# Patient Record
Sex: Male | Born: 1940 | Race: White | Hispanic: No | Marital: Married | Smoking: Former smoker
Health system: Southern US, Community
[De-identification: ages and names within clinical notes are randomized; demographics above are authoritative.]

## PROBLEM LIST (undated history)

## (undated) DIAGNOSIS — Z8719 Personal history of other diseases of the digestive system: Secondary | ICD-10-CM

## (undated) DIAGNOSIS — E1122 Type 2 diabetes mellitus with diabetic chronic kidney disease: Secondary | ICD-10-CM

## (undated) DIAGNOSIS — E039 Hypothyroidism, unspecified: Secondary | ICD-10-CM

## (undated) DIAGNOSIS — R112 Nausea with vomiting, unspecified: Secondary | ICD-10-CM

## (undated) DIAGNOSIS — I10 Essential (primary) hypertension: Secondary | ICD-10-CM

## (undated) DIAGNOSIS — Z87891 Personal history of nicotine dependence: Secondary | ICD-10-CM

## (undated) DIAGNOSIS — E785 Hyperlipidemia, unspecified: Secondary | ICD-10-CM

## (undated) DIAGNOSIS — N183 Chronic kidney disease, stage 3 unspecified: Secondary | ICD-10-CM

## (undated) DIAGNOSIS — M8580 Other specified disorders of bone density and structure, unspecified site: Secondary | ICD-10-CM

## (undated) DIAGNOSIS — I2510 Atherosclerotic heart disease of native coronary artery without angina pectoris: Secondary | ICD-10-CM

## (undated) DIAGNOSIS — K219 Gastro-esophageal reflux disease without esophagitis: Secondary | ICD-10-CM

## (undated) DIAGNOSIS — I219 Acute myocardial infarction, unspecified: Secondary | ICD-10-CM

## (undated) DIAGNOSIS — Z9289 Personal history of other medical treatment: Secondary | ICD-10-CM

## (undated) DIAGNOSIS — I739 Peripheral vascular disease, unspecified: Secondary | ICD-10-CM

## (undated) DIAGNOSIS — Z9889 Other specified postprocedural states: Secondary | ICD-10-CM

## (undated) DIAGNOSIS — I442 Atrioventricular block, complete: Secondary | ICD-10-CM

## (undated) DIAGNOSIS — J9819 Other pulmonary collapse: Secondary | ICD-10-CM

## (undated) DIAGNOSIS — I679 Cerebrovascular disease, unspecified: Secondary | ICD-10-CM

## (undated) DIAGNOSIS — Z8711 Personal history of peptic ulcer disease: Secondary | ICD-10-CM

## (undated) DIAGNOSIS — M1990 Unspecified osteoarthritis, unspecified site: Secondary | ICD-10-CM

## (undated) DIAGNOSIS — Z950 Presence of cardiac pacemaker: Secondary | ICD-10-CM

## (undated) DIAGNOSIS — E119 Type 2 diabetes mellitus without complications: Secondary | ICD-10-CM

## (undated) HISTORY — PX: LEG AMPUTATION BELOW KNEE: SHX694

## (undated) HISTORY — DX: Other specified disorders of bone density and structure, unspecified site: M85.80

## (undated) HISTORY — PX: ARTERIAL BYPASS SURGRY: SHX557

## (undated) HISTORY — PX: THORACOTOMY: SUR1349

## (undated) HISTORY — DX: Personal history of nicotine dependence: Z87.891

## (undated) HISTORY — DX: Atherosclerotic heart disease of native coronary artery without angina pectoris: I25.10

## (undated) HISTORY — DX: Cerebrovascular disease, unspecified: I67.9

## (undated) HISTORY — DX: Other pulmonary collapse: J98.19

## (undated) HISTORY — PX: CARPAL TUNNEL RELEASE: SHX101

## (undated) HISTORY — PX: INSERT / REPLACE / REMOVE PACEMAKER: SUR710

## (undated) HISTORY — DX: Essential (primary) hypertension: I10

## (undated) HISTORY — DX: Hypothyroidism, unspecified: E03.9

## (undated) HISTORY — PX: CATARACT EXTRACTION W/ INTRAOCULAR LENS  IMPLANT, BILATERAL: SHX1307

## (undated) HISTORY — PX: CARDIAC CATHETERIZATION: SHX172

## (undated) HISTORY — DX: Hyperlipidemia, unspecified: E78.5

---

## 1998-02-08 ENCOUNTER — Encounter: Admission: RE | Admit: 1998-02-08 | Discharge: 1998-05-09 | Payer: Self-pay | Admitting: Family Medicine

## 1998-09-06 ENCOUNTER — Ambulatory Visit (HOSPITAL_BASED_OUTPATIENT_CLINIC_OR_DEPARTMENT_OTHER): Admission: RE | Admit: 1998-09-06 | Discharge: 1998-09-06 | Payer: Self-pay | Admitting: Orthopedic Surgery

## 1999-01-25 ENCOUNTER — Inpatient Hospital Stay (HOSPITAL_COMMUNITY): Admission: EM | Admit: 1999-01-25 | Discharge: 1999-02-06 | Payer: Self-pay | Admitting: Emergency Medicine

## 1999-01-25 ENCOUNTER — Encounter: Payer: Self-pay | Admitting: Family Medicine

## 1999-01-29 ENCOUNTER — Encounter: Payer: Self-pay | Admitting: Family Medicine

## 1999-01-31 ENCOUNTER — Encounter: Payer: Self-pay | Admitting: Family Medicine

## 1999-02-19 ENCOUNTER — Encounter: Admission: RE | Admit: 1999-02-19 | Discharge: 1999-02-19 | Payer: Self-pay | Admitting: Family Medicine

## 1999-02-20 ENCOUNTER — Observation Stay (HOSPITAL_COMMUNITY): Admission: RE | Admit: 1999-02-20 | Discharge: 1999-02-21 | Payer: Self-pay | Admitting: Orthopedic Surgery

## 1999-02-20 ENCOUNTER — Encounter (INDEPENDENT_AMBULATORY_CARE_PROVIDER_SITE_OTHER): Payer: Self-pay | Admitting: Specialist

## 1999-03-06 ENCOUNTER — Encounter (INDEPENDENT_AMBULATORY_CARE_PROVIDER_SITE_OTHER): Payer: Self-pay | Admitting: Specialist

## 1999-03-06 ENCOUNTER — Inpatient Hospital Stay (HOSPITAL_COMMUNITY): Admission: EM | Admit: 1999-03-06 | Discharge: 1999-03-08 | Payer: Self-pay | Admitting: Orthopedic Surgery

## 1999-03-13 ENCOUNTER — Encounter: Admission: RE | Admit: 1999-03-13 | Discharge: 1999-06-11 | Payer: Self-pay | Admitting: Orthopedic Surgery

## 1999-03-26 ENCOUNTER — Inpatient Hospital Stay (HOSPITAL_COMMUNITY): Admission: EM | Admit: 1999-03-26 | Discharge: 1999-03-31 | Payer: Self-pay | Admitting: Orthopedic Surgery

## 1999-03-26 ENCOUNTER — Encounter (INDEPENDENT_AMBULATORY_CARE_PROVIDER_SITE_OTHER): Payer: Self-pay | Admitting: Specialist

## 1999-04-02 ENCOUNTER — Encounter: Admission: RE | Admit: 1999-04-02 | Discharge: 1999-04-20 | Payer: Self-pay | Admitting: Orthopedic Surgery

## 1999-06-26 ENCOUNTER — Encounter: Admission: RE | Admit: 1999-06-26 | Discharge: 1999-08-22 | Payer: Self-pay | Admitting: Orthopedic Surgery

## 2000-05-06 DIAGNOSIS — I219 Acute myocardial infarction, unspecified: Secondary | ICD-10-CM

## 2000-05-06 DIAGNOSIS — Z9289 Personal history of other medical treatment: Secondary | ICD-10-CM

## 2000-05-06 HISTORY — PX: CORONARY ARTERY BYPASS GRAFT: SHX141

## 2000-05-06 HISTORY — DX: Personal history of other medical treatment: Z92.89

## 2000-05-06 HISTORY — DX: Acute myocardial infarction, unspecified: I21.9

## 2000-06-23 ENCOUNTER — Ambulatory Visit (HOSPITAL_COMMUNITY): Admission: RE | Admit: 2000-06-23 | Discharge: 2000-06-24 | Payer: Self-pay | Admitting: Cardiology

## 2000-06-23 ENCOUNTER — Encounter: Payer: Self-pay | Admitting: Cardiology

## 2000-06-30 ENCOUNTER — Inpatient Hospital Stay (HOSPITAL_COMMUNITY): Admission: RE | Admit: 2000-06-30 | Discharge: 2000-07-12 | Payer: Self-pay | Admitting: Cardiothoracic Surgery

## 2000-06-30 ENCOUNTER — Encounter (INDEPENDENT_AMBULATORY_CARE_PROVIDER_SITE_OTHER): Payer: Self-pay | Admitting: *Deleted

## 2000-06-30 ENCOUNTER — Encounter: Payer: Self-pay | Admitting: Cardiothoracic Surgery

## 2000-07-01 ENCOUNTER — Encounter: Payer: Self-pay | Admitting: Cardiothoracic Surgery

## 2000-07-02 ENCOUNTER — Encounter: Payer: Self-pay | Admitting: Cardiothoracic Surgery

## 2000-07-03 ENCOUNTER — Encounter: Payer: Self-pay | Admitting: Cardiothoracic Surgery

## 2000-07-04 ENCOUNTER — Encounter: Payer: Self-pay | Admitting: Cardiothoracic Surgery

## 2000-07-05 ENCOUNTER — Encounter: Payer: Self-pay | Admitting: Cardiothoracic Surgery

## 2000-07-09 ENCOUNTER — Encounter: Payer: Self-pay | Admitting: Cardiothoracic Surgery

## 2000-07-11 ENCOUNTER — Encounter: Payer: Self-pay | Admitting: Cardiothoracic Surgery

## 2000-07-25 ENCOUNTER — Encounter: Payer: Self-pay | Admitting: Cardiothoracic Surgery

## 2000-07-25 ENCOUNTER — Encounter: Admission: RE | Admit: 2000-07-25 | Discharge: 2000-07-25 | Payer: Self-pay | Admitting: Cardiothoracic Surgery

## 2002-09-09 ENCOUNTER — Encounter: Payer: Self-pay | Admitting: Vascular Surgery

## 2002-09-09 ENCOUNTER — Inpatient Hospital Stay (HOSPITAL_COMMUNITY): Admission: RE | Admit: 2002-09-09 | Discharge: 2002-09-17 | Payer: Self-pay | Admitting: Vascular Surgery

## 2002-09-10 ENCOUNTER — Encounter: Payer: Self-pay | Admitting: Vascular Surgery

## 2002-10-12 ENCOUNTER — Inpatient Hospital Stay (HOSPITAL_COMMUNITY): Admission: AD | Admit: 2002-10-12 | Discharge: 2002-10-23 | Payer: Self-pay | Admitting: Vascular Surgery

## 2002-10-12 ENCOUNTER — Encounter (INDEPENDENT_AMBULATORY_CARE_PROVIDER_SITE_OTHER): Payer: Self-pay | Admitting: *Deleted

## 2002-10-13 ENCOUNTER — Encounter: Payer: Self-pay | Admitting: Vascular Surgery

## 2002-10-15 ENCOUNTER — Encounter (INDEPENDENT_AMBULATORY_CARE_PROVIDER_SITE_OTHER): Payer: Self-pay | Admitting: *Deleted

## 2003-01-25 ENCOUNTER — Inpatient Hospital Stay (HOSPITAL_COMMUNITY): Admission: RE | Admit: 2003-01-25 | Discharge: 2003-01-31 | Payer: Self-pay | Admitting: Orthopedic Surgery

## 2003-01-25 ENCOUNTER — Encounter (INDEPENDENT_AMBULATORY_CARE_PROVIDER_SITE_OTHER): Payer: Self-pay | Admitting: Specialist

## 2003-03-15 ENCOUNTER — Encounter: Admission: RE | Admit: 2003-03-15 | Discharge: 2003-06-13 | Payer: Self-pay | Admitting: Orthopedic Surgery

## 2003-06-14 ENCOUNTER — Encounter: Admission: RE | Admit: 2003-06-14 | Discharge: 2003-07-19 | Payer: Self-pay | Admitting: Orthopedic Surgery

## 2003-08-29 ENCOUNTER — Inpatient Hospital Stay (HOSPITAL_COMMUNITY): Admission: EM | Admit: 2003-08-29 | Discharge: 2003-09-02 | Payer: Self-pay | Admitting: Emergency Medicine

## 2003-08-31 ENCOUNTER — Encounter: Payer: Self-pay | Admitting: Cardiovascular Disease

## 2003-09-01 ENCOUNTER — Encounter (INDEPENDENT_AMBULATORY_CARE_PROVIDER_SITE_OTHER): Payer: Self-pay | Admitting: Specialist

## 2003-09-06 ENCOUNTER — Encounter (INDEPENDENT_AMBULATORY_CARE_PROVIDER_SITE_OTHER): Payer: Self-pay | Admitting: Specialist

## 2003-09-06 ENCOUNTER — Inpatient Hospital Stay (HOSPITAL_COMMUNITY): Admission: RE | Admit: 2003-09-06 | Discharge: 2003-09-14 | Payer: Self-pay | Admitting: Thoracic Surgery

## 2003-09-22 ENCOUNTER — Encounter: Admission: RE | Admit: 2003-09-22 | Discharge: 2003-09-22 | Payer: Self-pay | Admitting: Thoracic Surgery

## 2003-10-14 ENCOUNTER — Ambulatory Visit (HOSPITAL_COMMUNITY): Admission: RE | Admit: 2003-10-14 | Discharge: 2003-10-14 | Payer: Self-pay | Admitting: *Deleted

## 2003-11-24 ENCOUNTER — Encounter: Admission: RE | Admit: 2003-11-24 | Discharge: 2003-11-24 | Payer: Self-pay | Admitting: Thoracic Surgery

## 2004-02-15 ENCOUNTER — Encounter: Admission: RE | Admit: 2004-02-15 | Discharge: 2004-02-15 | Payer: Self-pay | Admitting: Thoracic Surgery

## 2005-05-13 ENCOUNTER — Ambulatory Visit (HOSPITAL_COMMUNITY): Admission: RE | Admit: 2005-05-13 | Discharge: 2005-05-13 | Payer: Self-pay | Admitting: Vascular Surgery

## 2005-06-07 ENCOUNTER — Inpatient Hospital Stay (HOSPITAL_COMMUNITY): Admission: RE | Admit: 2005-06-07 | Discharge: 2005-06-10 | Payer: Self-pay | Admitting: Vascular Surgery

## 2005-06-07 ENCOUNTER — Encounter (INDEPENDENT_AMBULATORY_CARE_PROVIDER_SITE_OTHER): Payer: Self-pay | Admitting: Specialist

## 2005-06-21 ENCOUNTER — Inpatient Hospital Stay (HOSPITAL_COMMUNITY): Admission: EM | Admit: 2005-06-21 | Discharge: 2005-07-01 | Payer: Self-pay | Admitting: Emergency Medicine

## 2005-08-21 ENCOUNTER — Ambulatory Visit: Payer: Self-pay | Admitting: Cardiology

## 2006-08-20 ENCOUNTER — Ambulatory Visit: Payer: Self-pay | Admitting: Cardiology

## 2007-08-17 ENCOUNTER — Encounter: Admission: RE | Admit: 2007-08-17 | Discharge: 2007-09-10 | Payer: Self-pay | Admitting: Orthopedic Surgery

## 2008-01-25 ENCOUNTER — Encounter: Admission: RE | Admit: 2008-01-25 | Discharge: 2008-04-24 | Payer: Self-pay | Admitting: Family Medicine

## 2008-08-10 ENCOUNTER — Ambulatory Visit: Payer: Self-pay | Admitting: Cardiology

## 2008-08-18 ENCOUNTER — Telehealth (INDEPENDENT_AMBULATORY_CARE_PROVIDER_SITE_OTHER): Payer: Self-pay

## 2008-09-15 ENCOUNTER — Telehealth (INDEPENDENT_AMBULATORY_CARE_PROVIDER_SITE_OTHER): Payer: Self-pay | Admitting: *Deleted

## 2008-09-19 ENCOUNTER — Encounter: Payer: Self-pay | Admitting: Cardiology

## 2008-09-19 ENCOUNTER — Ambulatory Visit: Payer: Self-pay

## 2008-10-04 ENCOUNTER — Encounter: Payer: Self-pay | Admitting: Cardiology

## 2008-10-04 DIAGNOSIS — I2510 Atherosclerotic heart disease of native coronary artery without angina pectoris: Secondary | ICD-10-CM

## 2008-10-04 DIAGNOSIS — I5040 Unspecified combined systolic (congestive) and diastolic (congestive) heart failure: Secondary | ICD-10-CM

## 2008-10-17 ENCOUNTER — Ambulatory Visit: Payer: Self-pay

## 2008-10-17 ENCOUNTER — Encounter: Payer: Self-pay | Admitting: Cardiology

## 2008-10-20 ENCOUNTER — Telehealth: Payer: Self-pay | Admitting: Cardiology

## 2008-11-05 DIAGNOSIS — E785 Hyperlipidemia, unspecified: Secondary | ICD-10-CM

## 2008-11-05 DIAGNOSIS — E1139 Type 2 diabetes mellitus with other diabetic ophthalmic complication: Secondary | ICD-10-CM

## 2008-11-05 DIAGNOSIS — Z794 Long term (current) use of insulin: Secondary | ICD-10-CM

## 2008-11-05 DIAGNOSIS — E039 Hypothyroidism, unspecified: Secondary | ICD-10-CM

## 2008-11-05 DIAGNOSIS — I679 Cerebrovascular disease, unspecified: Secondary | ICD-10-CM

## 2008-11-05 DIAGNOSIS — Z87891 Personal history of nicotine dependence: Secondary | ICD-10-CM

## 2008-11-09 ENCOUNTER — Ambulatory Visit: Payer: Self-pay | Admitting: Cardiology

## 2008-12-21 ENCOUNTER — Ambulatory Visit: Payer: Self-pay | Admitting: Cardiology

## 2009-03-22 ENCOUNTER — Encounter (INDEPENDENT_AMBULATORY_CARE_PROVIDER_SITE_OTHER): Payer: Self-pay | Admitting: *Deleted

## 2009-04-23 ENCOUNTER — Encounter: Payer: Self-pay | Admitting: Cardiology

## 2009-08-09 ENCOUNTER — Encounter: Payer: Self-pay | Admitting: Cardiology

## 2009-10-04 ENCOUNTER — Ambulatory Visit: Payer: Self-pay | Admitting: Cardiology

## 2010-05-26 ENCOUNTER — Encounter: Payer: Self-pay | Admitting: Thoracic Surgery

## 2010-05-27 ENCOUNTER — Encounter: Payer: Self-pay | Admitting: Thoracic Surgery

## 2010-06-07 NOTE — Assessment & Plan Note (Signed)
Summary: [facility]   Visit Type:  Follow-up Primary Provider:  Dr. Vernon Prey  CC:  CAD.  History of Present Illness: The patient presents for followup of his known coronary disease. Since I last saw him he has had no new cardiovascular complaints. He has had some recent dizziness but was told urgent care and it was interviewed her. This is improving. He denies any palpitations, presyncope or syncope. He has had not a chest pressure that was his previous angina. He still works and does not describe any shortness of breath, PND or orthopnea.  Current Medications (verified): 1)  Aspirin 81 Mg  Tabs (Aspirin) .Marland Kitchen.. 1 By Mouth Daily 2)  Vytorin 10-40 Mg Tabs (Ezetimibe-Simvastatin) .Marland Kitchen.. 1 By Mouth Daily 3)  Altace 2.5 Mg Caps (Ramipril) .Marland Kitchen.. 1 By Mouth Daily 4)  Synthroid 125 Mcg Tabs (Levothyroxine Sodium) .Marland Kitchen.. 1 By Mouth Daily 5)  Omeprazole 20 Mg Cpdr (Omeprazole) .Marland Kitchen.. 1 By Mouth Daily 6)  Nortriptyline .Marland Kitchen.. 1 By Mouth At Bedtime 7)  Gabapentin .Marland Kitchen.. 1 By Mouth Daily 8)  Humalog 100 Unit/ml Soln (Insulin Lispro (Human)) .... As Directed 9)  Lantus 100 Unit/ml Soln (Insulin Glargine) .... As Directed  Allergies (verified): 1)  ! Codeine  Past History:  Past Medical History: Reviewed history from 11/05/2008 and no changes required.  1. Coronary artery disease, status post CABG (February 2003, LIMA to      the LAD, left free radial artery to an obtuse marginal, saphenous      vein graft to diagonal, saphenous vein graft to right coronary      artery with endarterectomy)  2. Left below knee amputation.  3. Left arterial bypass (left femoral to posterior tibial bypass      grafting, left femoral artery and deep femoral artery      endarterectomy with vein patch angioplasty of the common femoral      artery and deep femoral artery February 2007, right femoral-      popliteal bypass, right iliac artery restent, bilateral below knee      amputations).  4. Thoracotomy with  drainage of a hemathorax and decortication of      fibrothorax.  5. Cerebrovascular disease (MRA in 2005 with 75% stenosis in distal      right vertebral artery and 75% stenosis greater in the distal      cervical internal carotid artery on the right, severe bilateral      disease and MRA of the extracranial circulation, high grade      stenosis of the distal right internal carotid artery at the      junction of the cervical internal carotid artery of the skull      base).  6. Diabetes mellitus.  7. Hyperlipidemia.  8. Hypothyroidism.  9. Previous tobacco use, quit in 1990.  Past Surgical History: Reviewed history from 11/05/2008 and no changes required.  1.  Bilateral below-knee amputations.  2.  Coronary artery bypass grafting in 2002 by [name], M.D.  3.  Previous right femoral-popliteal bypass.  4.  Previous femoral-posterior tibial bypass.  5.  Bilateral cataracts.  6.  Right carpal tunnel.  7.  Status post right thoracotomy for hemothorax/fibrothorax.  8.  Previous right iliac artery stent.  Review of Systems       As stated in the HPI and negative for all other systems.   Vital Signs:  Patient profile:   70 year old male Height:      68 inches Weight:  171 pounds BMI:     26.09 Pulse rate:   77 / minute Resp:     16 per minute BP sitting:   108 / 70  (right arm)  Vitals Entered By: Marrion Coy, CNA (October 04, 2009 12:08 PM)  Physical Exam  General:  Well developed, well nourished, in no acute distress. Head:  normocephalic and atraumatic Eyes:  PERRLA/EOM intact; conjunctiva and lids normal. Neck:  Neck supple, no JVD. No masses, thyromegaly or abnormal cervical nodes. Chest Wall:  Well-healed sternotomy scar Lungs:  Clear bilaterally to auscultation and percussion. Heart:  S1 and S2 within normal limits, no S3, no S4, grade apical systolic murmur nonradiating, no diastolic murmurs Abdomen:  Bowel sounds positive; abdomen soft and non-tender  without masses, organomegaly, or hernias noted. No hepatosplenomegaly. Msk:  Back normal, normal gait. Muscle strength and tone normal. Pulses:  bilateral femoral bruits, 2+ right radial Extremities:  status post bilateral lower extremity amputations, left radial artery harvest site intact Neurologic:  Alert and oriented x 3. Skin:  Intact without lesions or rashes. Cervical Nodes:  no significant adenopathy Axillary Nodes:  no significant adenopathy Inguinal Nodes:  no significant adenopathy Psych:  Normal affect.   EKG  Procedure date:  10/04/2009  Findings:      sinus rhythm, left axis deviation, poor anterior R-wave progression, old anteroseptal infarct, lateral T-wave inversions without old EKGs for comparison  Impression & Recommendations:  Problem # 1:  CAD (ICD-414.00)  The patient is having no new symptoms. No further cardiovascular testing is suggested. He will continue with risk reduction.  Orders: Echocardiogram (Echo)  Problem # 2:  HYPERLIPIDEMIA (ICD-272.4) His cholesterol is being followed by Dr. Christell Constant. I will defer to his management.  Problem # 3:  HEART FAILURE, COMBINED UNSPEC. (ICD-428.40)  Orders: Echocardiogram (Echo)  Patient Instructions: 1)  Your physician recommends that you schedule a follow-up appointment in: 1 yr with Dr Antoine Poche in [place] 2)  Your physician recommends that you continue on your current medications as directed. Please refer to the Current Medication list given to you today.

## 2010-09-18 NOTE — Assessment & Plan Note (Signed)
[facility]                            CARDIOLOGY OFFICE NOTE   Christopher Padilla, Christopher Padilla                     MRN:          [id number]  DATE:11/09/2008                            DOB:          1940/06/06    PRIMARY CARE DOCTOR:  Ernestina Penna, MD   REASON FOR PRESENTATION:  Evaluate the patient's mildly reduced ejection  fraction.   HISTORY OF PRESENT ILLNESS:  The patient presents for followup.  I saw  him in April for followup of his known coronary disease.  He was doing  well at that point without any significant symptoms.  However, because  of the age of his bypass grafts and his somewhat low functional level, I  did send him for a stress perfusion study.  This demonstrated infra and  apical defects consistent with scar.  The EF was 40%.  This was lower  than it had been previously.  There was no ischemia identified however.  I did order an echocardiogram, which demonstrated that the ejection  fraction was 40-45%.  He did describe decreased motion of the septum and  anterior wall.  There were no significant valvular abnormalities.   Today, I brought him back to discuss this reduced ejection fraction.  He  continues to be asymptomatic from a cardiovascular standpoint.  He does  not describe any new shortness of breath and has not had any PND or  orthopnea.  He does not have any palpitations, presyncope, or syncope.  He denies chest discomfort, neck or arm discomfort.  He does get around  on his bilateral prosthesis and tries to be active though he is having a  lot of pain with his right prosthesis and is trying to get one that fits  without problems.  This is concerning for the patient.   PAST MEDICAL HISTORY:  Coronary artery disease (see the August 10, 2008  note for details.  He is status post CABG in 2003), bilateral below the  knee amputations (status post left fem to posterior tibial bypass in  2007, right femoral-popliteal bypass),  thoracotomy with drainage of  hemothorax and decortication of the fibrothorax, cerebrovascular disease  (MRA in 2005 with 75% stenosis of the distal right vertebral artery and  75% stenosis in the distal cervical internal carotid artery on the  right, severe bilateral disease of the intracranial circulation, high-  grade distal right internal carotid artery stenosis at the junction of  the cervical internal carotid artery and the skull base), diabetes  mellitus, hyperlipidemia, hypothyroidism, previous tobacco abuse  quitting in 1990.   ALLERGIES:  CODEINE.   MEDICATIONS:  1. Omeprazole.  2. Gabapentin.  3. Synthroid 125 mcg daily.  4. Altace 2.5 mg daily.  5. Vytorin 10/40.  6. Gabapentin.  7. Fish oil.  8. Aciphex.  9. Lantus.  10.Humalog.  11.Aspirin.   REVIEW OF SYSTEMS:  As stated in the HPI and otherwise negative for  other systems.   PHYSICAL EXAMINATION:  GENERAL:  The patient is pleasant and in no  distress.  VITAL SIGNS:  Blood pressure 140/70, heart rate 82 and regular, weight  170 pounds, body mass index 26.  NECK:  No jugular venous distention at 90 degrees, carotid upstroke  brisk and symmetric, no bruits, no thyromegaly.  LUNGS:  Clear to auscultation bilaterally.  CHEST:  Well-healed sternotomy scar.  HEART:  PMI not displaced or sustained, S1 and S2 within normal limits,  no S3, no S4, no clicks, no rubs, no murmurs.  ABDOMEN:  Positive bowel sounds normal in frequency and pitch, no  rebound or guarding.  EXTREMITIES:  Right radial 2+, absent left radial, femorals are not  appreciated, he is status post bilateral BKA.  NEUROLOGIC:  Grossly intact.   EKG unchanged from previous.  Sinus rhythm, rate 82, old inferior  infarct, old anteroseptal infarct, lateral T-wave inversions.   ASSESSMENT/PLAN:  1. Cardiomyopathy.  I had a long discussion with the patient about the      results of these findings.  His ejection fraction is reduced.  He      may have  had some events since his last echocardiogram in 2006.      However, he remains asymptomatic.  There was no ischemia on the      Cardiolite.  Therefore, I do not think catheterization is      indicated.  Rather, I would pursue this with medications to manage      a cardiomyopathy.  I will start carvedilol 3.125 mg twice a day and      titrate.  He is already on an ACE inhibitor.  2. Coronary disease as above.  We will continue with risk reduction.  3. Followup.  I will see him back in about 6 weeks for the next med      titration.     Rollene Rotunda, MD, [facility]  Electronically Signed    JH/MedQ  DD: 11/09/2008  DT: 11/10/2008  Job #: [id number]   cc:   Ernestina Penna, M.D.

## 2010-09-18 NOTE — Assessment & Plan Note (Signed)
[facility]                            CARDIOLOGY OFFICE NOTE   EDU, ON                     MRN:          [id number]  DATE:08/10/2008                            DOB:          24-Aug-1940    PRIMARY CARE PHYSICIAN:  Ernestina Penna, MD   REASON FOR PRESENTATION:  Evaluate the patient with coronary artery  disease.   HISTORY OF PRESENT ILLNESS:  The patient is 70 years old.  He presents  for followup after 2 years.  In the last couple of years, he has had no  cardiovascular complaints.  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  He does a little yard  work, occasionally weed trimming and riding a [employer], [employer].  He does not  probably achieve more than 4 METS routinely.  He has actually been  limited very recently because of pain in his right stump.  He is not  having any chest pressure, neck or arm discomfort.  He is not having any  palpitations, presyncope, or syncope.  He has had no PND or orthopnea.   PAST MEDICAL HISTORY:  Coronary artery disease (status post CABG in  February 2003 with a LIMA to the LAD, left free radial artery to an  obtuse marginal, saphenous vein graft to diagonal, saphenous vein graft  to the right coronary artery with coronary endarterectomy), left  arterial bypass (left femoral to posterior tibial bypass grafting in  February 2007, right femoral popliteal bypass), bilateral below the knee  amputations, thoracotomy with drainage of a hemothorax and decortication  of a fibrothorax, cerebrovascular disease (MRA in 2005 with 75% stenosis  in the distal right vertebral artery and 75% stenosis in the distal  cervical internal carotid artery on the right, severe bilateral disease  of the intracranial circulation, high-grade distal right internal  carotid artery stenosis at the junction of the cervical internal carotid  artery and the skull base), diabetes mellitus,  hyperlipidemia,  hypothyroidism, and previous tobacco use quit in 1990.   ALLERGIES:  CODEINE.   MEDICATIONS:  1. Nortriptyline.  2. Aspirin 81 mg daily.  3. Humalog.  4. Lantus.  5. Aciphex.  6. Fish oil.  7. Gabapentin.  8. Vytorin 10/40.  9. Altace 2.5 mg daily.  10.Synthroid 125 mcg daily.  11.Omeprazole.   REVIEW OF SYSTEMS:  As stated in the HPI and otherwise negative for  other systems.   PHYSICAL EXAMINATION:  GENERAL:  The patient is in no distress.  VITAL SIGNS:  Blood pressure 130/68, heart rate 85 and regular, weight  172 pounds, and body mass index 26.  HEENT:  Eyelids unremarkable; pupils are equal, round, and reactive to  light; fundi not visualized; oral mucosa unremarkable.  NECK:  No jugular venous distention at 45 degrees, carotid upstroke  brisk and symmetric, right greater than left carotid bruits, no  thyromegaly.  LYMPHATICS:  No cervical, axillary, or inguinal adenopathy.  LUNGS:  Clear to auscultation bilaterally.  BACK:  No costovertebral angle tenderness.  CHEST:  Well healed sternotomy scar.  HEART:  PMI not  displaced or sustained; S1 and S2 within normal limits,  no S3, no S4; no clicks, no rubs, no murmurs.  ABDOMEN:  Flat; positive bowel sounds, normal in frequency and pitch; no  bruits, no rebound, no guarding, no midline pulsatile mass; no  organomegaly.  SKIN:  No rashes, no nodules.  EXTREMITIES:  Right radial 2+, 1+ right femoral, absent left femoral,  bilateral bruits, status post BKA bilaterally.  NEURO:  Grossly intact.   EKG, sinus rhythm, old anteroseptal infarct, old inferior infarct, no  significant change from previous.   ASSESSMENT AND PLAN:  1. Coronary artery disease.  The patient now has 57-year-old bypass      graft.  He has ongoing diabetes.  He is not able to be particularly      active.  Given this and following ACC/AHA guidelines, stress      testing is indicated.  He would not be able to walk on a treadmill,       so he will have an adenosine perfusion study.  Further evaluation      will be based on these results.  He will continue with risk      reduction.  2. Dyslipidemia.  This is followed closely by Dr. Christell Constant.  I have      reviewed these labs and he has an acceptable profile.  3. Diabetes.  His hemoglobin A1c was 8.8.  This is being addressed by      Dr. Christell Constant.  He is also working better on controlling it since      receiving this information.  4. Followup.  I will see him back in about 18 months or sooner based      on symptoms or the results of the stress test.     Rollene Rotunda, MD, [facility]  Electronically Signed    JH/MedQ  DD: 08/10/2008  DT: 08/11/2008  Job #: [id number]   cc:   Ernestina Penna, M.D.

## 2010-09-18 NOTE — Assessment & Plan Note (Signed)
[facility]                            CARDIOLOGY OFFICE NOTE   Christopher Padilla, Christopher Padilla                     MRN:          [id number]  DATE:12/21/2008                            DOB:          12/27/1940    PRIMARY CARE PHYSICIAN:  Ernestina Penna, MD   REASON FOR PRESENTATION:  Evaluate the patient with mildly reduced  ejection fraction.   HISTORY OF PRESENT ILLNESS:  The patient presents for followup of the  above.  He has a mildly reduced ejection fraction with an EF of 40%.  I  tried to add carvedilol 3.125 mg twice a day.  However, the patient  became very fatigued.  He stopped the medication and felt better.  He  restarted it, got worse again.  He has since taken himself completely  off it.  He now feels much better.  He has also gotten a new right lower  extremity prosthesis and is able to be more mobile.  He says now he is  not having any chest discomfort, neck or arm discomfort.  He is having  no palpitations.  Denies any presyncope or syncope.  She has had no PND  or orthopnea.   PAST MEDICAL HISTORY:  1. Coronary artery disease (see the August 10, 2008, note for details.      He is status post CABG in 2003).  2. Bilateral below-the-knee amputation (status post left fem to      posterior tibial bypass in 2007, right fem-pop bypass).  3. Thoracotomy with drainage of a hemathorax and decortication      fibrothorax.  4. Cerebrovascular disease (MRA in 2005 with 75% stenosis of the      distal right vertebral artery and 75% stenosis of the distal      cervical internal carotid artery on the right, severe bilateral      disease of the intracranial circulation, high-grade distal right      internal carotid artery stenosis at the junction of the cervical      internal carotid artery and skull base).  5. Diabetes mellitus.  6. Hyperlipidemia.  7. Hypothyroidism.  8. Previous tobacco use, quitting in 1990.   ALLERGIES:  CODEINE.    MEDICATIONS:  1. Aspirin 81 mg daily.  2. Nortriptyline.  3. Humalog.  4. Lantus.  5. Fish oil.  6. Vytorin 10/40.  7. Altace 2.5 mg daily.  8. Synthroid 125 mcg daily.  9. Gabapentin.  10.Omeprazole.   REVIEW OF SYSTEMS:  As stated in the HPI and otherwise negative for  other systems.   PHYSICAL EXAMINATION:  GENERAL:  The patient is in no distress.  VITAL SIGNS:  Blood pressure 106/60, heart rate 79 and regular, weight  170 pounds, body mass index 26.  HEENT:  Eyes unremarkable.  Pupils equal and reactive to light.  Fundi  not visualized.  Oral mucosa unremarkable.  NECK:  No jugular venous distention at 45 degrees.  Carotid upstroke  brisk and symmetric.  No thyromegaly.  LUNGS:  Clear to auscultation bilaterally.  CHEST:  Well-healed sternotomy scar.  HEART:  PMI not displaced or  sustained.  S1 and S2 within normal limits.  No S3.  No S4.  No clicks.  No rubs.  Soft 2/6 apical systolic murmur,  slightly heard at the aortic outflow tract.  No diastolic murmurs.  ABDOMEN:  Positive bowel sounds, normal in frequency and pitch.  No  bruits.  No rebound.  No guarding.  No midline pulsatile mass.  No  organomegaly.  SKIN:  No rashes.  No nodules.  EXTREMITIES:  Right radial 2+ pulse, absent left radial, femorals are  not appreciated.  He is status post bilateral BKA.  NEUROLOGIC:  Grossly intact.   ASSESSMENT AND PLAN:  1. Cardiomyopathy.  I am unable to titrate carvedilol.  His blood      pressure is running a little bit low today.  I am going to keep him      on the meds as listed.  He is not having any symptoms.  No further      cardiovascular thing is suggested.  2. Coronary disease.  He will continue with risk reduction and has      close followup by Dr. Christell Constant.  3. Followup.  I will see him back in 6 months or sooner if needed.     Rollene Rotunda, MD, [facility]  Electronically Signed    JH/MedQ  DD: 12/21/2008  DT: 12/21/2008  Job #: [id number]   cc:   Ernestina Penna,  M.D.

## 2010-09-21 NOTE — Consult Note (Signed)
[place]. [facility]  Patient:    Christopher Padilla, Christopher Padilla                     MRN: [id number] Proc. Date: 06/23/00 Adm. Date:  [id number] Attending:  Lenoria Farrier CC:         Everardo Beals. Juanda Chance, M.D. [facility]  Monica Becton, M.D.  CVTS Office  [place] [place] [facility]   Consultation Report  REASON FOR CONSULTATION:  Severe three-vessel coronary artery disease with class IV angina.  REFERRING PHYSICIAN:  Everardo Beals. Juanda Chance, M.D. [facility]  PRIMARY CARE PHYSICIAN:  Monica Becton, M.D.  CHIEF COMPLAINT:  Chest pain.  HISTORY OF PRESENT ILLNESS:  I was asked to see this 70 year old white male diabetic in consultation by Dr. Juanda Chance for evaluation of severe three-vessel coronary artery disease.  The patient has had exertional chest pain for the past few months.  Over the past several weeks, he has had nocturnal chest pain, usually waking up every night between 1 and 2 a.m. which would spontaneously resolve.  The patient has been doing exertional activities during the day including yard work with less chest pain or no chest pain. Because of his severe peripheral vascular disease, diabetes, and family history of coronary disease, he was referred for a cardiac evaluation and was seen at [facility] three days ago.  He was scheduled for outpatient cardiac catheterization which was performed today by Dr. Juanda Chance which revealed severe three-vessel coronary disease with 80% stenosis of the LAD, 90% stenosis of the right, and 90% stenosis of the obtuse marginal with evidence of an inferior wall MI with inferior wall akinesia and ejection fraction of 40% and left ventricular end diastolic pressure measured at 36 mmHg.  Because of the patients diffuse diabetic disease, he was referred for surgical coronary revascularization.  He is currently pain free in the cardiology observation ward, 6500.  PAST MEDICAL HISTORY: 1. Type 2 diabetes, insulin required. 2.  Peripheral vascular disease status post femoral-popliteal bypass graft    by Dr. [name] and a subsequent left BKA by Dr. Lajoyce Corners in year 2000. 3. Hypothyroidism.  SOCIAL HISTORY:  The patient is married and has a son.  He is not working since his amputation.  He has not smoked in years.  He denies significant alcohol use.  MEDICATIONS: 1. Aspirin 81 mg q.d. 2. Insulin 70/30 mix, 32 units q.a.m., 24 units q.p.m. 3. Synthroid 0.125 mg q.d. 4. Neurontin 300 mg q.d. 5. Vitamin E 400 units q.d. 6. Nortriptyline 25 mg p.o. q.h.s.  ALLERGIES:  He is allergic and intolerant to CODEINE which causes hallucinations.  FAMILY HISTORY:  There is a positive family history of coronary artery disease.  His father underwent three-vessel bypass surgery at age 29.  His mother died of carcinoma.  There is a positive family history of diabetes.  REVIEW OF SYSTEMS:  The patient denies any significant change in weight, fever, or night sweats.  He does have diffuse joint pain and moderate arthritis.  He does have some claudication of the right leg.  He denies any DVT in the right leg or any injury to the right leg.  He denies any problems with bleeding, diaphysis, or easy bruisability.  He denies blood per rectum. He denies TIA or CVA.  He does not have any problems with nocturia or hesitancy.  There is a remote history of peptic ulcer disease.  He denies any productive cough or any thoracic injuries.  Review of Systems is  otherwise negative.  PHYSICAL EXAMINATION:  VITAL SIGNS:  He is 5 feet 9 inches and weighs 174 pounds.  Blood pressure is 150/90, heart rate 70 and regular sinus rhythm.  GENERAL:  A pleasant, well-appearing, middle-aged white male in his hospital room in the cardiology observation area following cardiac catheterization.  HEENT:  Normocephalic.  Full EOMs.  Pharynx is clear.  NECK:  Without JVD, thyromegaly, or carotid bruit.  LYMPHATIC:  No palpable adenopathy in the neck,  supraclavicular, or axillary regions.  LUNGS:  Clear to auscultation.  There is no thoracic deformity.  CARDIAC:  Regular rate and rhythm without S3 gallop or murmur.  ABDOMEN:  Mildly distended without organomegaly, mass, tenderness, or abdominal bruit.  EXTREMITIES:  Well-healed left BKA.  The right leg has atrophic skin changes with indurated skin from the tibial area down to the right foot.  There are no open ulcers on the right leg or foot.  There is no pitting edema.  Peripheral pulses are nonpalpable in the right foot, 1 to 2+ in the femoral regions, 1 to 2+ bilaterally in the radials, and his Doppler exam indicates a patent palmar arch on the left hand.  NEUROLOGIC:  Alert and oriented x 3 with full motor function while at bed rest following catheterization.  RECTAL:  Exam deferred.  SKIN:  Reveals no lesions, rashes, or ulcers.  ASSESSMENT:  I reviewed the coronary arteriograms and will discuss them with Dr. Juanda Chance.  I agree with the recommendation for surgical revascularization for his best long-term survival and preservation of ventricular function.  He will be high risk due to the poor targets in his coronary vessels and his reduced left ventricular function.  Conduit for bypass will be a concern due to his severe peripheral vascular disease and left leg amputation.  We will obtain vein mapping to assess the adequacy of vein in the right thigh and my need to consider using left radial artery in addition to the left mammary artery.  After these studies are completed, I will visit the patient and family tomorrow to discuss the timing of surgery.  He should be off his nortriptyline for a few days prior to surgery to reduce the risk of postoperative hypotension.  I discussed this plan with the patient and his  family, and they are in agreement.  Thank you very much for this consult. DD:  06/23/00 TD:  06/23/00 Job: [id number]

## 2010-09-21 NOTE — Op Note (Signed)
NAME:  Christopher Padilla, Christopher Padilla                        ACCOUNT NO.:  [id number]   MEDICAL RECORD NO.:  [id number]                   PATIENT TYPE:  INP   LOCATION:  5031                                 FACILITY:  [facility]   PHYSICIAN:  [name], M.D.                DATE OF BIRTH:  04/27/1941   DATE OF PROCEDURE:  01/25/2003  DATE OF DISCHARGE:                                 OPERATIVE REPORT   PREOPERATIVE DIAGNOSIS:  Ischemic, necrotic right foot.   POSTOPERATIVE DIAGNOSIS:  Ischemic, necrotic right foot.   PROCEDURE:  Right below-knee amputation.   SURGEON:  [name], M.D.   ANESTHESIA:  Spinal.   ESTIMATED BLOOD LOSS:  Minimal.   ANTIBIOTICS:  1 g Kefzol.   TOURNIQUET TIME:  10 minutes at 350 mmHg.   DISPOSITION:  To PACU in stable condition.   INDICATION FOR PROCEDURE:  The patient is a 70 year old gentleman with type  2 insensate diabetic neuropathy who is status post a partial foot  amputation.  The patient has had a progressive ischemic necrosis of the  wound and has developed full-thickness wound necrosis down to bone.  The  patient presents at time for below-knee amputation.  The risks and benefits  were discussed, including infection, neurovascular injury, nonhealing of the  wound, and need for higher-level amputation.  The patient states he  understands and wishes to proceed at this time.   DESCRIPTION OF PROCEDURE:  The patient was brought to OR room 4 and  underwent a spinal anesthetic.  After an adequate level of anesthesia  obtained, the patient was placed supine on the operating table and his right  lower extremity was prepped using Duraprep and draped in a sterile field and  the ischemic and necrotic foot was draped out into an impervious  stockinette.  The leg was elevated and the tourniquet inflated to 350 mmHg.  A transverse incision was made 10 cm distal to the tibial tubercle.  This  curved proximally and a large posterior flap was created.  The  tibia was  transected 1 cm proximal to the skin incision and the fibula was transected  1 cm proximal to the tibia.  The large posterior flap was created and the  sciatic nerve was pulled, cut, and allowed to retract.  The vascular bundles  were suture ligated x3 each.  The tourniquet was deflated, hemostasis was  obtained.  The deep superficial fascial layers were closed using #1 PDS.  The skin was closed using Proximate staples.  The wound was covered with  Adaptic, orthopedic sponges, ABD dressing, Webril, and a Coban dressing.  The patient was then taken to PACU in stable condition.  [name], M.D.   MVD/MEDQ  D:  01/25/2003  T:  01/26/2003  Job:  [id number]

## 2010-09-21 NOTE — Discharge Summary (Signed)
NAME:  Christopher Padilla, Christopher Padilla                        ACCOUNT NO.:  [id number]   MEDICAL RECORD NO.:  [id number]                   PATIENT TYPE:  INP   LOCATION:  2023                                 FACILITY:  [facility]   PHYSICIAN:  Di Kindle. [name], M.D.        DATE OF BIRTH:  August 24, 1940   DATE OF ADMISSION:  09/09/2002  DATE OF DISCHARGE:  09/17/2002                                 DISCHARGE SUMMARY   ADMISSION DIAGNOSES:  1. Nonhealing right great toe ulcer with cellulitis of the right lower     extremity.  2. Status post left femoral/popliteal bypass graft, followed by left below     knee amputation.  3. Coronary artery disease, status post myocardial infarction.  4. Adult onset diabetes mellitus, insulin dependent type 2.  5. Hypertension.  6. Hypothyroidism.  7. Gastroesophageal reflux disease.   DISCHARGE DIAGNOSES:  1. Nonhealing right great toe ulcer with cellulitis of the right lower     extremity.  2. Status post left femoral/popliteal bypass graft, followed by left below     knee amputation.  3. Coronary artery disease, status post myocardial infarction.  4. Adult onset diabetes mellitus, insulin dependent type 2.  5. Hypertension.  6. Hypothyroidism.  7. Gastroesophageal reflux disease.   PROCEDURES:  1. Aortogram and bilateral iliac arteriograms with right lower extremity     runoff.  2. Percutaneous transluminal angioplasty of the right common iliac and stent     placement.  3. Right femoral to above knee popliteal bypass graft with 6 mm PTFE,     09/10/02, Dr. Durwin Nora.   BRIEF HISTORY:  The patient is a 70 year old white male, medical patient of  Ernestina Penna, M.D.  The patient is well managed and undergone left lower  extremity bypass and followed by left below knee amputation.  He has done  well until two months ago, he developed an ulceration of the right great  toe.  He was initially treated at home with warm soaks, however, this is not  improving.  He  saw Dr. Durwin Nora in the office on 08/25/02.  He was started on  Cipro and cautioned that he might need to undergo arteriogram to further  delineate his anatomy with possible revascularization.  The wounds continued  to worsen and he was seen at [facility] where his antibiotic regimen was changed from Cipro to  Augmentin.  He was also placed on dressing changes and some type of gel.  Since then, the foot has become progressively worse and he has significant  pain in his right great toe, extending into his leg at rest.  He also has a  great deal of calf pain with minimal ambulation and exertion.  ABI's done in  our office in 12/03, were 0.71 on the right.  It was recommended at [facility] that he undergo further treatment.  However, he returned to  [facility].  He was seen in the office by Dr. Arbie Cookey and is admitted  now for IV antibiotics with further treatment as indicated.   PAST MEDICAL HISTORY:  Includes peripheral vascular disease, coronary artery  disease with prior MI, hyperlipidemia, gastroesophageal reflux disease,  hypothyroidism, insulin dependent type 2 diabetes, hypertension.  The  patient denies any history of CVA, COPD or cancer.   PAST SURGICAL HISTORY:  Status post CABG, 2002 by [name], M.D.  Status post multiple peripheral vascular surgeries by Dr. Durwin Nora.  Left below  knee amputation by [name], M.D.  Right carpal tunnel surgery and  infected cataract removal.   CURRENT MEDICATIONS:  1. Neurontin 300 mg p.o. q.h.s.  2. Nortriptyline 25 mg h.s.  3. Synthroid 0.125 mg daily.  4. Altace 2.5 mg daily.  5. Lantus 34 units daily, h.s.  6. Aciphex 20 mg daily.  7. Ecotrin 81 mg daily.  8. Zocor 40 mg daily.  9. Sliding scale Humalog insulin.  10.      Augmentin.   ALLERGIES:  Totally none known.   SOCIAL HISTORY:  He is married, lives with his wife.  He has one son.  He  denies alcohol use.   Smokes one pack a day for 33+ years.  He quit in 1999.   For further history and physical, please see the dictated note.   HOSPITAL COURSE:  The patient was admitted to the hospital.  He was seen by  Dr. Durwin Nora that evening and plans were made to go forward with an aortogram  the next day.  This showed single renal artery bilaterally with no  significant renal artery stenosis.  There was no significant infrarenal  arterial occlusive disease and no disease within the left common iliac,  external iliac or hypogastric arteries.  On the right side, he had a long  irregular stenosis of chronic iliac artery extending down to the  bifurcation.  This was successfully ballooned and stented.  On the right  side, he had some mild stenosis, right common femoral artery, producing  about a 10-15% stenosis and severe disease to the proximal SFA with  reconstitution of the above knee popliteal artery.  There is a patent  popliteal artery with distal stenosis including anterior tibia.  There is  moderate diffuse disease within the tibial peroneal trunk, the proximal  posterior tibial artery.  Two vessel runoff in the peroneal and posterior  tibial.  Posterior tibial was occluded from the ankle.  After reviewing this  study and undergoing angioplasty, the patient was then taken to the  operating room.  Angioplasty was to the right common iliac with stent  placement.  The following day, 5/7, the patient was taken to the operating  room and underwent right common femoral to above knee popliteal bypass graft  and debridement of the right great toe.  After debridement, it was found  that he would have to have major debridement for deep infection, but at the  time of surgery, he had a blue-black crusted ulcer on the plantar surface of  the great toe.  The great toe and the forefoot had a layer of epidermis removed which appeared to be nonfunctional.  This was cleaned up and no  further debridement was deemed  necessary at this time.  The patient was  returned to the recovery room and then 3300 in satisfactory condition.  The  first postoperative morning, he had a +2 popliteal graft pulse.  He did  well  and was transferred to the floor and started on progressive ambulation.  He  was also placed on whirlpool to the right lower extremity.  In the first 48  hours, the site cleaned up quite well.  He was started on physical therapy  and occupational therapy for ambulation, also.  He continued to make good  progress.  Cultures grew nothing from the toe debridement sites.  The ulcer  is stable and is being treated with Accuzyme and by 09/16/02, he continued to  do well.  He could home in the a.m., 09/17/02.  Home health whirlpool is not  available and the patient will have to come [facility] for this and we are  going to go with the Accuzyme, dressing changes b.i.d. once by the home  health nurse and once by the family.  He will have his staples removed by  the home health nurse on 09/24/02.  He will return to see Dr. Durwin Nora on  09/23/04 with followup with Dr. Christell Constant in [place] as directed.   DISCHARGE MEDICATIONS:  He will resume all of his preadmission medicines as  before, except for his Lantus which has been increased to 40 units from 34.  He is on a sliding scale already and he will use that to adjust his insulins  after Lantus.  He is going home on Cipro 500 mg p.o. b.i.d. and add Tylox 2  p.o. q.4h. p.r.n.   DISCHARGE ACTIVITIES:  Activity moderate.  No pushing, lifting over ten  pounds.  No driving.   DISCHARGE CONDITION:  Improved.       Eber Hong, P.A.                 Di Kindle. [name], M.D.    WDJ/MEDQ  D:  09/16/2002  T:  09/17/2002  Job:  [id number]   cc:   Ernestina Penna, M.D.  [address] [place]  [place] [address]  Fax: [phone]

## 2010-09-21 NOTE — Discharge Summary (Signed)
NAME:  Christopher Padilla, Christopher Padilla              ACCOUNT NO.:  [id number]   MEDICAL RECORD NO.:  [id number]          PATIENT TYPE:  INP   LOCATION:  5015                         FACILITY:  [facility]   PHYSICIAN:  Di Kindle. [name], M.D.DATE OF BIRTH:  Jul 12, 1940   DATE OF ADMISSION:  06/21/2005  DATE OF DISCHARGE:  07/01/2005                                 DISCHARGE SUMMARY   ADMISSION DIAGNOSIS:  Left groin incision dehiscence with cellulitis.   DISCHARGE DIAGNOSIS:  1.  Left groin incision dehiscence with cellulitis with wound culture      positive for Serratia marcescens and moderate group D Strep sensitive to      ciprofloxacin.  2.  Methicillin resistant Staph aureus positive nasal cultures.  3.  Peripheral vascular occlusive disease status post previous bilateral      femoral popliteal bypass ultimately with bilateral below the knee      amputations.  4.  Insulin dependent diabetes mellitus.  5.  Coronary artery disease with previous myocardial infarction status post      coronary artery bypass grafting in 2002 by Dr. Kathlee Nations Trigt.  6.  Dyslipidemia.  7.  Peripheral neuropathy.  8.  Questionable renal insufficiency.  9.  Gastroesophageal reflux disease.  10. History of left pleural effusion.  11. Hypothyroidism.  12. Hypertension.  13. History of difficult intubation in the past.  14. Bilateral cataracts.  15. Right carpal tunnel syndrome.  16. Status post right thoracotomy for hemothorax.  17. Previous right iliac artery stent.   ALLERGIES:  no known drug allergies.   BRIEF HISTORY:  Christopher Padilla is a 70 year old Caucasian male well known to  CVTS as he is status post multiple previous hospitalizations for coronary  revascularization as well as peripheral vascular occlusive disease and  ultimately bilateral below knee amputations.  Most recently, he was  hospitalized and underwent surgery on June 07, 2005, by Dr. [name].  At  that time, the patient was having difficulties  with an ischemic left below  the knee amputation and he underwent replacement of a vein patch angioplasty  of the common femoral and deep femoral arteries.  Since that time, he has  developed progressive dehiscence of his left groin wound which began  draining clear fluid.  The amount of drainage had increased over several  days.  He had no associated fever, chills, or sweats.  On exam, his left  thigh was markedly swollen with erythema associated with the circumferential  region of the incision.  He was evaluated by Dr. [name] partner, Dr.  Fabienne Bruns, who felt he should be admitted for IV antibiotics and  possible surgical drainage and debridement.   HOSPITAL COURSE:  On June 21, 2005, Christopher Padilla was admitted to [facility] for left groin incision dehiscence with cellulitis.  He was  started on IV vancomycin.  Left groin drainage was cultured with results as  mentioned previously.  Ciprofloxacin was added for coverage.  Also, by that  time, he had also been treated with Rocephin rather than vancomycin.  Once  on antibiotics, Christopher Padilla groin cellulitis showed some improvement.  However, on February 19, Dr. [name] opened the wound at the bedside and  started wet to dry dressings using moist saline and 4 by 4 gauze.  Over the  next several days, the wound care was continued.  His wound became less and  less erythematous with decreasing drainage.  Otherwise, he remained stable.  He was restarted on home medications including his insulin regimen, although  the patient's hemoglobin A1C was elevated at 8 which was felt could be  further evaluated on an outpatient basis by his primary physician.  Other  labs showed a normal white count throughout his hospitalization with the  last labs showing white cell count of 6.3, hemoglobin 10.8, hematocrit 31.4,  platelet count 312.  Sodium 133, potassium 4, blood glucose 78, BUN 17,  creatinine 1.4, total protein 6, albumin 3,  AST 17, ALT 23, alkaline phos  102, total bilirubin 0.5.   On July 01, 2005, Christopher Padilla was felt appropriate for discharge.  His  groin wound was showing pink granulation tissue although still with mild  erythema but improving.  Plans were made for him to continue a two week  course of Ciprofloxacin with b.i.d. dressing changes with home health  nursing which was arranged with [facility].   DISCHARGE MEDICATIONS:  1.  Ciprofloxacin 750 mg p.o. b.i.d. x 10 more days.  2.  Aspirin 81 mg p.o. daily.  3.  Lantus insulin 30 units subcutaneously q.h.s.  4.  Pamelor 25 mg p.o. q. h.s.  5.  Vytorin 10/40 mg p.o. q.p.m.  6.  Neurontin 600 mg p.o. daily.  7.  Protonix 40 mg p.o. daily.  8.  Altace 2.5 mg p.o. daily.  9.  Synthroid 0.125 mcg p.o. daily.  10. Humalog 23 units subcutaneously b.i.d.  11. Tylox 1-2 tablets p.o. q.4-6h. p.r.n. pain.   DISCHARGE INSTRUCTIONS:  He is to follow diabetic appropriate diet with  continued routine monitoring of his blood sugars.  He is to avoid driving  and heavy lifting for the next two weeks or as instructed by Dr. [name] at  his follow up appointment.  He should increase his activities slowly and may  shower.  He is to pack his left groin wound twice daily with moist saline  gauze and cover with dry dressings.  The home health nurse will assist with  dressing changes.  He should call if he develops fever greater than 101 or  increasing redness or drainage from his wound site.  He is to follow up with  Dr. [name] at the CVTS office on July 10, 2005, at 9:10 a.m.  and should call sooner if needed.      Jerold Coombe, P.A.      Di Kindle. [name], M.D.  Electronically Signed    AWZ/MEDQ  D:  07/30/2005  T:  07/31/2005  Job:  [id number]

## 2010-09-21 NOTE — Op Note (Signed)
NAME:  KULE, GASCOIGNE                        ACCOUNT NO.:  [id number]   MEDICAL RECORD NO.:  [id number]                   PATIENT TYPE:  INP   LOCATION:  5025                                 FACILITY:  [facility]   PHYSICIAN:  Di Kindle. [name], M.D.        DATE OF BIRTH:  [birth year]   DATE OF PROCEDURE:  09/10/2002  DATE OF DISCHARGE:                                 OPERATIVE REPORT   PREOPERATIVE DIAGNOSES:  1. Aortogram.  2. Bilateral iliac arteriogram.  3. Right lower extremity runoff.  4. Percutaneous transluminal angioplasty of a right common iliac artery     stenosis with primary stenting using a Genesis [id number].  5. Angioplasty with 8 x 4 Powerflex balloon.   TOTAL CONTRAST:  165 mL.   INDICATIONS:  This is a 70 year old diabetic gentleman who presents with  cellulitis of the right foot and evidence of multilevel arterial occlusive  disease.  He is brought into the hospital for IV antibiotics and  arteriography and possible revascularization.  The procedure and potential  complications of arteriography and angioplasty were discussed with the  patient preoperatively.  All of his questions were answered and he was  agreeable to proceed.   DESCRIPTION OF PROCEDURE:  The patient was taken to the PV lab at [facility] and  sedated with 1 mg of Versed and 50 mcg of fentanyl.  Both groins were  prepped and draped in the usual sterile fashion.  After the skin was  anesthetized with 1% lidocaine, the right common femoral artery was  cannulated and a guidewire introduced into the iliac artery.  I was unable  to pass the [facility] wire.  A 5 French sheath was introduced and then I was  able to manipulate an angled Glidewire through the stenosis, which was  identified after I performed a right iliac oblique arteriogram.  A pigtail  catheter was then positioned at the L1 vertebral body and flush aortogram  obtained.  There was a 3 cm long common iliac artery stenosis which was  quite  irregular and produced a 30 mmHg  pressure gradient.  It was decided  that given the multilevel disease with a nonhealing wound of the foot, we  would address this with angioplasty in anticipation of lower extremity  bypass later pending the results of his completion right lower extremity  study.  The 5 French sheath was exchanged for a long 6 [place] sheath and the  patient was heparinized.  He received 3000 units of heparin.  The sheath was  passed through the stenosis, which had been identified using the marker  tape, and then an arteriogram was obtained by injecting through the sheath  below the stenosis.  Landmarks were obtained and then the sheath was  advanced through the stenosis with the dilator, and then the dilator was  removed.  A pre-mounted Genesis [id number] stent mounted on a 7 mm balloon was  positioned across the stenosis and then the  sheath was retracted.  The  balloon was retracted to eight atmospheres for 45 seconds.  The balloon was  then removed and an arteriogram obtained.  There was some mild residual  stenosis, and therefore I went back with an 8 x 4 Powerflex balloon and  performed additional angioplasty within the stent at 6 atmospheres for 60  seconds.  At the completion there was an excellent result with no stenosis  and no pressure gradient noted.  Next completion arteriogram was obtained.  The sheath was removed at completion with no immediate complications noted.   FINDINGS:  Single renal arteries bilaterally with no significant renal  artery stenosis identified.  No significant infrarenal arterial occlusive  disease and no disease within the left common iliac, external iliac, or  hypogastric arteries.  On the right side he had a long, irregular stenosis  of the common iliac artery extending down to the bifurcation.  This was  successfully ballooned and stented as described above.   On the right side has some mild stenosis in the common femoral artery  producing  10-15% stenosis.  There is severe diffuse disease of the proximal  superficial femoral artery with reconstitution of the above-knee popliteal  artery.  There is a patent popliteal artery with a distal stenosis and an  occluded anterior tibial artery.  There is moderate diffuse disease within  the tibioperoneal trunk and proximal posterior tibial artery.  There is two-  vessel runoff via the peroneal and posterior tibial arteries.  The posterior  tibial artery is occluded at the ankle.   CONCLUSION:  1. Right common iliac artery stenosis as described above.  2. 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                                               Di Kindle. [name], M.D.    CSD/MEDQ  D:  09/10/2002  T:  09/11/2002  Job:  [id number]   cc:   [facility]

## 2010-09-21 NOTE — H&P (Signed)
NAME:  Christopher Padilla, Christopher Padilla                        ACCOUNT NO.:  [id number]   MEDICAL RECORD NO.:  [id number]                   PATIENT TYPE:  INP   LOCATION:  2029                                 FACILITY:  [facility]   PHYSICIAN:  Duke Salvia, M.D.               DATE OF BIRTH:  1941/03/23   DATE OF ADMISSION:  08/29/2003  DATE OF DISCHARGE:                                HISTORY & PHYSICAL   HISTORY OF PRESENT ILLNESS:  Christopher Padilla is a 70 year old gentleman who  presents with dizziness.   He has a past medical history notable for ischemic heart disease, status  post bypass surgery in February of 2002, receiving a LIMA to his LAD, a vein  graft to his D-1, a vein graft to his RCA and a radial to his OM-1 with an  ejection fraction prior to cath of 40%.  He also has severe vascular  disease, status post bilateral BKAs, a right iliac stent and a right fem-pop  and some modest renal insufficiency.  He has longstanding diabetes and  hypertension.   He developed a viral bronchial illness in January and since that time, has  had problems with some exercise intolerance.  Chest x-ray today demonstrated  a large left pleural effusion.   Of note, however, the patient presented because of dizziness today.  He has  had a history of over the last 10 days of positional vertigo noted mostly at  night.  This morning, he awakened, was severely dizzy upon sitting,  profoundly dizzy upon standing.  He sat in his wheelchair and spent the next  5 hours in the wheelchair, remaining dizzy throughout, having to hold onto  the wheelchair for fear of falling out of the wheelchair, though he said  there was no accompanying nausea as there was with his other vertiginous  symptoms.  His symptoms gradually abated through the day but they have been  persistent, not withstanding normal vital signs, and no evidence of  arrhythmia for at least the last 7 hours.   PAST MEDICAL HISTORY:  His past medical history is  notable primarily as  above.  He does have:  1. Diabetes with neuropathy.  2. Hypothyroidism that is treated.  3. GE reflux disease.  4. Hyperlipidemia.   PAST SURGICAL HISTORY:  His past surgical history is as noted previously; he  also has had cataract surgery.   MEDICATIONS:  His medications include:  1. Synthroid 125 mcg.  2. Altace 2.5 mg.  3. Aciphex 20 mg.  4. Nortriptyline 25 mg.  5. Neurontin 300 mg.  6. Zocor 40 mg.  7. Lantus 32 units.  8. Humalog a.m. and h.s.  9. Baby aspirin.   ALLERGIES:  He has no known drug allergies.   SOCIAL HISTORY:  He is married.  His wife smokes.  He has 1 step-daughter.  He does not use cigarettes, alcohol or recreational drugs.   REVIEW  OF SYSTEMS:  His review of systems is noted on the intake sheet from  Christopher Padilla, P.A. tonight and is not further recounted here.   PHYSICAL EXAMINATION:  GENERAL:  On examination, he is an elderly Caucasian  male in no acute distress.  VITAL SIGNS:  His blood pressure lying was 146/82 with a pulse of 85,  sitting was 130/69 with a pulse of 83 and standing, it was 119/65 with a  pulse of 85 with good blood pressure recovery by 3 minutes and 5 minutes.  HEENT:  His HEENT exam demonstrated bilateral nystagmus without icterus or  xanthomata.  NECK:  The neck veins were 7-8 cm.  The carotids were brisk bilaterally.  There was a quiet carotid bruit appreciated by the P.A. but not be me.  BACK:  The back was without kyphosis or scoliosis.  LUNGS:  Lung sounds were markedly decreased on the left.  HEART:  The heart exam had a PMI that was medially displaced.  Heart sounds  were somewhat distant.  ABDOMEN:  The abdomen was soft without tenderness.  I could not appreciated  a midline pulsation.  EXTREMITIES:  Femoral pulses were 2+.  Distal pulses obviously were gone  because of his bilateral BKAs.  SKIN:  Exam was normal.  NEUROLOGICAL:  Exam was also grossly normal, apart from the nystagmus noted   above.   LABORATORY AND ACCESSORY CLINICAL DATA:  Chest x-ray demonstrated a very  large left pleural effusion.   Laboratories demonstrated a hemoglobin of 11.3, a sodium of 129, albumin of  ?5.8.  INR was okay.   IMPRESSION:  1. Vertigo.  2. Prolonged dizziness today with some orthostatic component but not     resolved with changes in position.  3. Large left pleural effusion following a bronchitis-like illness.  4. Coronary artery disease.     a. Status post coronary artery bypass graft in 2002.     b. Ejection fraction of 40% prior to coronary artery bypass graft.  5. Peripheral vascular disease.     a. Status post bilateral below-knee amputations.     b. Status post femoropopliteal.     c. Status post common iliac artery stent.  6. Orthostatic hypotension.  7. Treated hypothyroidism.  8. Prior strokes by CT scanning.   Mr. Chisolm has a panoply of problems and unfortunately, I do not know what  the unifying diagnosis is.  I have asked the neurologist to help [place] with his  dizziness.  There is clearly a vertiginous component of the persistent  dizziness today; potentially, it could be a hemodynamic phenomenon  potentially related to an arrhythmia or vasomotor instability as suggested  by his orthostasis, but with his stable vital signs and persistent dizziness  this evening, I wonder whether this is not a primarily neurological event.  CT scanning has demonstrated prior strokes.   As relates to his large pleural effusion, this is presumably related to his  prior bronchial illness and we have asked the pulmonologist to see him and  consider tapping for symptom relief.   I am not sure that this is going to turn out to be a cardiac problem at all  and we will ask internal medicine to assist with his care in the morning.  Duke Salvia, M.D.   SCK/MEDQ  D:  08/29/2003  T:  08/30/2003  Job:  [id number]   cc:   Ernestina Penna,  M.D.  [address] [place]  [place] [address]  Fax: [phone]

## 2010-09-21 NOTE — Op Note (Signed)
   NAME:  Christopher Padilla, Christopher Padilla                        ACCOUNT NO.:  [id number]   MEDICAL RECORD NO.:  [id number]                   PATIENT TYPE:  OIB   LOCATION:  5705                                 FACILITY:  [facility]   PHYSICIAN:  Di Kindle. [name], M.D.        DATE OF BIRTH:  06-10-1940   DATE OF PROCEDURE:  10/12/2002  DATE OF DISCHARGE:                                 OPERATIVE REPORT   PREOPERATIVE DIAGNOSIS:  Diabetic foot infection of the right foot.   POSTOPERATIVE DIAGNOSIS:  Diabetic foot infection of the right foot.   PROCEDURE:  Open ray amputation of the right great toe.   SURGEON:  Di Kindle. [name], M.D.   ASSISTANT:  Nurse.   ANESTHESIA:  Spinal.   INDICATIONS:  This is a 70 year old gentleman who had presented with a  progressive diabetic foot infection of the right foot.  He was taken  urgently to the operating room for open amputation of the right great toe.   DESCRIPTION OF PROCEDURE:  The patient was taken to the operating room where  she received a spinal anesthetic.  The right foot was prepped and draped in  the usual sterile fashion.  A tennis racket incision was made encompassing  the right great toe and dissection carried down to the metatarsal bone which  was elevated and divided with the reciprocating saw proximally.  Devitalized  tissue was sharply debrided, and there was a track extending down the tendon  plane which was debrided and irrigated with antibiotic solution.  I tried to  preserve as much skin as possible for future reconstruction of the foot.  Intraoperative cultures were sent.  The bone did not appear grossly  infected.  There was fairly good bleeding.  The patient does have known  distal tibial disease.  After hemostasis was obtained, the wound was  irrigated with copious amounts of antibiotic solution.  It was then packed  open.  Sterile dressing was applied.  The patient tolerated the procedure  well and was transferred to the  recovery room in satisfactory condition.  All needle and sponge counts were correct.                                               Di Kindle. [name], M.D.    CSD/MEDQ  D:  10/12/2002  T:  10/12/2002  Job:  [id number]

## 2010-09-21 NOTE — Discharge Summary (Signed)
   NAME:  Christopher Padilla, Christopher Padilla                        ACCOUNT NO.:  [id number]   MEDICAL RECORD NO.:  [id number]                   PATIENT TYPE:  INP   LOCATION:  5031                                 FACILITY:  [facility]   PHYSICIAN:  [name], M.D.                DATE OF BIRTH:  Aug 26, 1940   DATE OF ADMISSION:  01/25/2003  DATE OF DISCHARGE:  01/31/2003                                 DISCHARGE SUMMARY   PREADMISSION DIAGNOSIS:  Ischemic, necrotic right foot.   DISCHARGE DIAGNOSIS:  Ischemic, necrotic right foot.   PROCEDURE:  Right below-the-knee amputation.   DISPOSITION:  The patient was discharged to home in stable condition.   FOLLOW UP:  In the office in 2 weeks.   HISTORY OF PRESENT ILLNESS:  The patient is a 70 year old gentleman with  insensate diabetic neuropathy who has had a progressive, ischemic necrosis  of the right foot, with involvement in the entire hind-foot; who presented  for below-the-knee amputation.  The patient has failed conservative care  with foot salvage and wishes to proceed with below-the-knee amputation.   HOSPITAL COURSE:  Essentially unremarkable.  The patient underwent a right  below-the-knee amputation on January 25, 2003.  Tourniquet time was 10  minutes at 350 mmHg.  He received a spinal anesthetic and received Kefzol  preoperatively.  Postoperatively, he remained on Kefzol for infection  prophylaxis.  The patient was seen by physical therapy and also received a  cardiology consult.  The patient requested cardiology consult during his  hospital admission.   He complained of increased nerve pain and his Neurontin was increased for  the treatment of his neuropathic pain. Patient was slow with physical  therapy and rehab was consulted.  Patient eventually progressed well with  therapy and was discharged to home in stable condition on September 27, with  follow up in the office in 2 weeks.      [name], M.D.    MVD/MEDQ  D:  03/15/2003  T:  03/15/2003  Job:  [id number]

## 2010-09-21 NOTE — Consult Note (Signed)
NAME:  Christopher Padilla, Christopher Padilla                        ACCOUNT NO.:  [id number]   MEDICAL RECORD NO.:  [id number]                   PATIENT TYPE:  INP   LOCATION:  5031                                 FACILITY:  [facility]   PHYSICIAN:  Rollene Rotunda, M.D.                DATE OF BIRTH:  Feb 28, 1941   DATE OF CONSULTATION:  01/25/2003  DATE OF DISCHARGE:                                   CONSULTATION   REFERRING PHYSICIAN:  Dr. [name].   REASON FOR REFERRAL:  Evaluate patient with coronary disease.   HISTORY OF PRESENT ILLNESS:  The patient is a pleasant 70 year old gentleman  with a history of coronary disease, who is status post right BKA today.  We  were asked by Dr. Ernestina Penna, his primary care physician, to evaluate  postoperatively.  The patient seems to have done well with his surgery.  He  is currently denying any chest pain or shortness of breath.  He has had no  PND or orthopnea.  He has had no palpitations, presyncope or syncope.  He is  only complaining of leg pain.   PAST MEDICAL HISTORY:  1. Coronary artery disease, status post CABG, February 2002 (details not     available, apparently four-vessel CABG).  2. Diabetes mellitus.  3. Peripheral vascular disease.  4. Hypertension.  5. Hypothyroidism.  6. Renal insufficiency.  7. Ischemic cardiomyopathy, EF 40%.   PAST SURGICAL HISTORY:  1. Left FEM-POP bypass.  2. Status post left BKA and right BKA.   MEDICATIONS:  1. Neurontin 300 mg nightly.  2. Nortriptyline.  3. Synthroid 0.125 mg daily.  4. Altace 2.5 mg daily.  5. Zocor 40 mg daily.   SOCIAL HISTORY:  The patient lives with his wife.  He quit smoking 14 years  ago.   FAMILY HISTORY:  Family history is noncontributory for early coronary artery  disease.   REVIEW OF SYSTEMS:  Review of systems is as stated in the HPI, otherwise,  negative.   PHYSICAL EXAMINATION:  GENERAL:  The patient is in no distress.  VITAL SIGNS:  Blood pressure 153/83, heart  rate 77 and regular.  HEENT:  Eyes unremarkable; pupils equal, round and reactive to light; fundi  not visualized.  NECK:  Jugular venous distention of 13 cm at 40 degrees, carotid upstroke  brisk and symmetric, right carotid bruit.  LYMPHATICS:  No lymphadenopathy.  LUNGS:  Lungs clear to auscultation bilaterally.  BACK:  No costovertebral angle tenderness.  CHEST:  Unremarkable.  HEART:  PMI not displaced or sustained; S1 and S2 within normal limits; no  S3, no S4, no murmurs.  ABDOMEN:  Abdomen flat; positive bowel sounds, normal frequency and pitch;  no bruits, no rebound, no guarding, no midline pulsatile mass, no  organomegaly.  SKIN:  No rash, no nodules.  EXTREMITIES:  Upper pulses 2+, 2+ femorals.  NEUROLOGIC:  Oriented to person, place and  time.  Cranial nerves II-XII  grossly intact, motor grossly intact.   LABORATORY AND ACCESSORY CLINICAL DATA:  EKG:  Sinus rhythm, rate 72, axis  within normal limits, intervals within normal limits, poor anterior R wave  progression, possible old anterior infarct.   ASSESSMENT AND PLAN:  1. Coronary disease.  The patient is having no ongoing symptoms.  He can     continue to be managed with his preoperative medication, starting aspirin     when able.  2. Ischemic cardiomyopathy.  We will follow the patient and titrate his     medications, increasing his ACE (angiotension-converting enzyme)     inhibitor as tolerated and adding a beta blocker as tolerated.  We will     need to be very careful with volume management and I would suggest     careful intakes and outputs.                                               Rollene Rotunda, M.D.    JH/MEDQ  D:  01/25/2003  T:  01/26/2003  Job:  [id number]

## 2010-09-21 NOTE — Op Note (Signed)
NAME:  Christopher Padilla, Christopher Padilla                        ACCOUNT NO.:  [id number]   MEDICAL RECORD NO.:  [id number]                   PATIENT TYPE:  INP   LOCATION:  3399                                 FACILITY:  [facility]   PHYSICIAN:  Di Kindle. [name], M.D.        DATE OF BIRTH:  Nov 19, 1940   DATE OF PROCEDURE:  09/10/2002  DATE OF DISCHARGE:                                 OPERATIVE REPORT   PREOPERATIVE DIAGNOSES:  Nonhealing right foot wound with cellulitis and  superficial femoral artery and tibial occlusive disease.   POSTOPERATIVE DIAGNOSES:  Nonhealing right foot wound with cellulitis and  superficial femoral artery and tibial occlusive disease.   OPERATION PERFORMED:  Right femoral to above-knee popliteal artery bypass  with 6 mm PTFE graft.   SURGEON:  Di Kindle. [name], M.D.   ASSISTANT:  Eber Hong, P.A.   ANESTHESIA:  General.   INDICATIONS FOR PROCEDURE:  The patient is a 70 year old gentleman who  presented with cellulitis and a nonhealing wound of his right great toe.  He  underwent an arteriogram and had successful angioplasty of a right iliac  stenosis  and then was brought to the operating room for right lower  extremity bypass.  The procedure and potential complications including but  not limited to bleeding, wound problems, graft thrombosis and limb loss were  discussed with the patient preoperatively.  All his questions were answered  and he was agreeable to proceed.   DESCRIPTION OF PROCEDURE:  The patient was taken to the operating room and  received a general anesthetic.  Of note, he was a difficult airway and his  wife has been informed of this for future anesthetics.  The right lower  extremity and lower abdomen and groin were prepped and draped in the usual  sterile fashion.  An oblique incision was made above the inguinal crease on  the right where the common femoral artery was  dissected free.  The site  where he had undergone  arteriogram earlier today was identified and closed  with a 6-0 Prolene.  There was a fair amount of posterior plaque; however,  the patient had a good pulse and a soft spot anteriorly on the common  femoral artery.  The above-knee popliteal artery was exposed through a  longitudinal incision above the knee.  The artery again had some posterior  plaque but was soft anteriorly.  A tunnel was created between the two  incisions and a 6 mm PTFE graft was tunneled between the two incisions.  Of  note, this patient had all his greater saphenous vein taken from the left  leg for a previous bypass and currently had a left below-knee amputation.  On the right side, he had all the greater saphenous vein taken to the level  of the knee for previous open heart surgery.  Therefore, he did not have  adequate length of vein for a vein graft.  In addition, the patient  did have  some tibial disease which might be amenable to vein patch angioplasty if  this became necessary in the future.  Next, the patient was heparinized  after the graft had been tunneled.  The common femoral artery was clamped  proximally and distally and a longitudinal arteriotomy was made.  The graft  was spatulated and sewn end-to-side to the common femoral artery using  continuous 6-0 Prolene suture.  The graft was then pulled to the appropriate  length for anastomosis to the above-knee popliteal artery.  A tourniquet was  placed on the thigh, the leg exsanguinated with an Esmarch bandage and the  tourniquet inflated to 250 mmHg.  Under tourniquet control, a longitudinal  arteriotomy was made above the popliteal artery.  The graft was cut to the  appropriate length, spatulated and sewn end-to-side to the above-knee  popliteal artery using continuous 6-0 Prolene suture parachuting both the  heel and toe of the anastomosis.  At the completion, there was a good  posterior tibial and peroneal signal with the Doppler.  Intraoperative   arteriogram was obtained which showed no technical problems.  Hemostasis was  obtained in the wounds.  The wounds were closed with a deep layer of 2-0  Vicryl, subcutaneous layer of 2-0 Vicryl and the skin closed with staples.  I then debrided the right great toe.  There was a blister and I took off  this dead skin.  There did not appear to be any deep space infection and  there was no drainage to suggest this.  A sterile dressing was applied, the  patient tolerated the procedure well and was transferred to the recovery  room in satisfactory condition.  All needle and sponge counts were correct.                                                  Di Kindle. [name], M.D.    CSD/MEDQ  D:  09/10/2002  T:  09/13/2002  Job:  [id number]   cc:   Ernestina Penna, M.D.  [address] [place]  [place] [address]  Fax: [phone]

## 2010-09-21 NOTE — H&P (Signed)
   NAME:  Christopher Padilla, Christopher Padilla                        ACCOUNT NO.:  [id number]   MEDICAL RECORD NO.:  [id number]                   PATIENT TYPE:  INP   LOCATION:  5031                                 FACILITY:  [facility]   PHYSICIAN:  [name], M.D.                DATE OF BIRTH:  1941-03-27   DATE OF ADMISSION:  01/25/2003  DATE OF DISCHARGE:                                HISTORY & PHYSICAL   HISTORY OF PRESENT ILLNESS:  The patient is a 70 year old gentleman with  diabetic insensate neuropathy.  The patient is status post partial foot  amputation of the right foot and is also status post wound treatment  antibiotics to try to salvage the foot.  The patient has failed conservative  care.  He has had progressive ischemic necrosis of the wound and presents at  this time for a below knee amputation.   ALLERGIES:  No known drug allergies.   MEDICATIONS:  1. Neurontin 300 mg q.h.s.  2. Synthroid 125 mcg daily.  3. Altace 2.5 mg daily.  4. Aciphex 20 mg daily.  5. Aspirin 81 mg daily.  6. Lantus insulin 32 units q.h.s.  7. Nortriptyline 25 mg daily.   PAST MEDICAL HISTORY:  1. Coronary artery disease.  2. 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  3. Peripheral vascular disease.  4. Hypertension.  5. Hypothyroidism.   PHYSICAL EXAMINATION:  GENERAL APPEARANCE:  The patient is in no acute  distress.  VITAL SIGNS:  Temperature 97.7, heart rate 86, respiratory rate 20, blood  pressure 120/69, height 66-3/4 inches, weight 167 pounds.  NECK:  Supple with no bruits.  LUNGS:  Clear to auscultation.  CARDIOVASCULAR:  Regular rate and rhythm.  EXTREMITIES:  Examination of his right lower extremity, he has large  ischemic necrotic wound with exposed bone with drainage and cellulitis of  the entire foot up to the mid tibia.   ASSESSMENT:  Ischemic necrotic right foot with cellulitis.    PLAN:  The patient is scheduled for below knee amputation at this time.  Risks and  benefits were discussed including persistent infection, nonhealing  of the wound, need for high level amputation.  The patient states he  understands and wishes to proceed at this time.                                                [name], M.D.    MVD/MEDQ  D:  01/25/2003  T:  01/25/2003  Job:  [id number]

## 2010-09-21 NOTE — Op Note (Signed)
NAME:  Christopher Padilla, Christopher Padilla              ACCOUNT NO.:  [id number]   MEDICAL RECORD NO.:  [id number]          PATIENT TYPE:  INP   LOCATION:  2550                         FACILITY:  [facility]   PHYSICIAN:  Di Kindle. [name], M.D.DATE OF BIRTH:  07/16/1940   DATE OF PROCEDURE:  06/07/2005  DATE OF DISCHARGE:                                 OPERATIVE REPORT   PREOPERATIVE DIAGNOSIS:  Ischemic left below-the-knee amputation.   POSTOPERATIVE DIAGNOSIS:  Ischemic left below-the-knee amputation.   PROCEDURE:  Left common femoral artery and deep femoral artery  endarterectomy with vein patch angioplasty of the common femoral artery and  deep femoral artery.   SURGEON:  Di Kindle. [name], M.D.   ASSISTANT:  Rowe Clack, P.A.-C.   ANESTHESIA:  General.   INDICATIONS:  This is a 70 year old gentleman who had presented with  progressive ischemia of his left BKA. He underwent an arteriogram which  showed he had no significant inflow disease, although we had a plaque within  the common femoral artery and significant plaque within the proximal deep  femoral artery. It was felt that really the only option to improve  circulation to the BKA was endarterectomy and vein patch angioplasty.   TECHNIQUE:  The patient was taken to the operating room and received a  general anesthetic. The left lower extremity and the right arm were prepped  and draped in the usual sterile fashion. A longitudinal incision was made in  the left groin and through this incision through dense scar tissue the  common femoral artery, proximal old occluded fem-pop graft, superficial  femoral artery and deep femoral artery were dissected free. The graft was  chronically occluded and the superficial femoral artery was chronically  occluded. There was diffuse severe plaque within the deep femoral artery and  I had to dissect approximately 12 cm of the deep femoral artery in order to  get to a point below the plaque where  the deep femoral artery bifurcated.  Several branches were controlled with loops. We then harvested a segment of  basilic vein from the right arm with one longitudinal incision overlying the  vein. The branches were divided between 3-0 silk ties. The vein was ligated  at each end, distended up with heparinized saline and opened longitudinally.  This wound was closed with a deep layer of 3-0 Vicryl and the skin with 4-0  Vicryl after the hemostasis was obtained. The patient had been heparinized.  The common femoral and deep femoral arteries were controlled as were all  with multiple branches. The artery was opened longitudinally and this was  extended down to the secondary branch of the deep femoral artery. An  endarterectomy plane was established proximally and the plaque proximally  sharply divided. Endarterectomy was completed down to the distal deep  femoral artery. Several tacking sutures were placed distally with 6-0  Prolene. The vein patch was then sewn using two continuous 6-0 Prolene  sutures. Prior to completing the patch closure, the artery was back bled and  flushed appropriately and the anastomosis completed. Flow was reestablished  into the left leg and there was  a good Doppler signal in the deep femoral at  the completion. A 15 Blake drain was placed in the groin. Hemostasis was  obtained in the wound and the  wound was closed with a deep layer of 3-0 Vicryl. The skin closed with 4-0  Vicryl. A sterile dressing was applied. The patient tolerated the procedure  well and was transferred to recovery room in satisfactory condition. All  needle and sponge counts were correct.      Di Kindle. [name], M.D.  Electronically Signed     CSD/MEDQ  D:  06/07/2005  T:  06/07/2005  Job:  [id number]   cc:   [name], MD  Fax: [phone]

## 2010-09-21 NOTE — Op Note (Signed)
NAME:  Christopher Padilla, Christopher Padilla                        ACCOUNT NO.:  [id number]   MEDICAL RECORD NO.:  [id number]                   PATIENT TYPE:  INP   LOCATION:  2312                                 FACILITY:  [facility]   PHYSICIAN:  Ines Bloomer, M.D.              DATE OF BIRTH:  March 22, 1941   DATE OF PROCEDURE:  DATE OF DISCHARGE:                                 OPERATIVE REPORT   PREOPERATIVE DIAGNOSIS:  Chronic left hemothorax.   POSTOPERATIVE DIAGNOSES:  Left hemothorax and left fibrothorax.   OPERATION PERFORMED:  Left video-assisted thoracoscopic surgery, drainage of  hemothorax, decortication of fibrothorax.   SURGEON:  Ines Bloomer, M.D.   FIRST ASSISTANT:  Coral Ceo, P.A.   ANESTHESIA:  General.   After percutaneous insertion of all monitoring lines, the patient underwent  general anesthesia, was prepped and draped in the usual sterile manner.  Two  trocar sites were made in the anterior and posterior axillary line at the  7th intercostal space and 2 trocars were inserted.  On inserting the trocar,  there was a large hemothorax in the posterior segment that was removed.  A 0-  degree scope was inserted.  There was a thickened parietal and visceral  pleura.  Biopsies were taken of the parietal pleura which revealed fibrosis.  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  A posterolateral  thoracotomy incision was made over the 6th intercostal space.  The  latissimus was divided.  The serratus was reflected anteriorly.  The 6th  intercostal space was entered.  A portion of the 6th rib was taken  subperiosteally posteriorly at the angle.  Two Tuffiers were placed at right  angles and dissection was started on the incision dissecting up to 5 mm  pleura superiorly and inferiorly and then down to the diaphragm.  Then it  was dissected laterally to medially.  It was first dissected off the aorta  from the diaphragm up to the arch and then down to the diaphragm.  It  was  dissected off with sharp and blunt dissection.  Then it was identified on  the diaphragm.  It was dissected off the diaphragm, dissected off the  costophrenic angle.  Then the cardiophrenic angle was dissected free,  freeing up the right lower lobe which had a thickened peel on it.  Next, the  lingula was identified and freed up off the heart.  It also had a thickened  peel.  Then the parietal pleura was dissected up medially to the mediastinal  reflection and then superiorly up to the second rib.  After the parietal  pleura had all been removed with sharp and blunt dissection with  electrocautery, all bleeding was electrocoagulated.  Attention was then  turned to the lung and the thickened peel was dissected off the lung with  sharp and blunt dissection using Kittners.  Several holes in the lungs were  oversewn with 3-0  Vicryl and bleeding was controlled with electrocautery.  The lower lobe was then completely decorticated from the superior segment  down to the basilar segments freeing it up.  It expanded well and the upper  lobe, the lingula, was decorticated and much of the posterior segment was  decorticated.  There was a little left very superiorly, but we did not want  to do this because this was where the mammary came down from his previous  heart surgery.  After all bleeding had been electrocoagulated, 3 chest tubes  were placed, one anterior and posterior, two straight 28s in the anterior  and posterior axillary line, then a third one through the midaxillary line,  a right-angle 28.  They were sutured in place with 0 silk.  A Marcaine block  was done in the usual fashion.  The chest  was closed with 4 pericostals.  The On-Q catheters were placed underneath  the pericostals. We used #1 Vicryl in the muscle area, 2-0 Vicryl in the  subcutaneous tissue, and Dermabond and staples for the skin.  The patient  was returned to the recovery room in serious condition.                                                Ines Bloomer, M.D.    DPB/MEDQ  D:  09/06/2003  T:  09/07/2003  Job:  [id number]   cc:   Oley Balm. Sung Amabile, M.D. [facility]

## 2010-09-21 NOTE — H&P (Signed)
NAME:  DAE, HIGHLEY                        ACCOUNT NO.:  [id number]   MEDICAL RECORD NO.:  [id number]                   PATIENT TYPE:  OIB   LOCATION:  2899                                 FACILITY:  [facility]   PHYSICIAN:  Di Kindle. [name], M.D.        DATE OF BIRTH:  04/13/1941   DATE OF ADMISSION:  10/12/2002  DATE OF DISCHARGE:                                HISTORY & PHYSICAL   REASON FOR ADMISSION:  Aggressive infection of the right foot.   HISTORY:  This is a pleasant 70 year old gentleman who had originally  presented with a nonhealing wound of the right great toe.  He underwent an  arteriogram with right iliac artery angioplasty and stent placement and then  on the same day he was taken to the operating room where he underwent a  right femoral to above knee popliteal artery bypass with a 6-mm PTFE graft  on Sep 10, 2002.  I had been following the foot as an outpatient and he was  seen in the office today by Dr. Hart Rochester and found to have a progressive  infection of the right foot with cellulitis extending up the foot and  progression of the right great toe wound.  He was admitted for emergent open  amputation of the toe.   PAST MEDICAL HISTORY:  1. Coronary artery disease and a history of a myocardial infarction in the     past.  2. The patient has a history of type 2 diabetes and is currently insulin     dependent.  3. History of peripheral vascular disease.  4. Hypothyroidism.  5. Hypertension.  6. A history of a difficult intubation in the past.   SOCIAL HISTORY:  He is married and has one son.  He has a remote history of  tobacco use.   FAMILY HISTORY:  His father died with coronary artery disease.  His mother  died with cancer.   MEDICATIONS:  1. Neurontin 300 mg p.o. q.h.s.  2. Nortriptyline 25 mg p.o. q.h.s.  3. Synthroid 0.125 mg p.o. daily.  4. Altace 2.5 mg p.o. daily.  5. Lantus insulin 34 units subcu daily h.s.  6. Aciphex 20 mg p.o. daily.  7.  Ecotrin 81 mg p.o. daily.  8. Zocor 40 mg p.o. daily.  9. Keflex 500 mg p.o. t.i.d.   ALLERGIES:  The patient has no known drug allergies.   REVIEW OF SYSTEMS:  He has had no recent weight loss or weight gain.  CARDIAC:  He has had no significant chest pain or chest pressure.  He has no  history of palpitations.  He has had no bronchitis, asthma, or wheezing.  He  has had no recent change in his bowel habits.  He has no history of peptic  ulcer disease.  He has had no dysuria or frequency.  He has had no previous  history of DVT or phlebitis.  He is status post a left below-the-knee  amputation by Dr. Lajoyce Corners in the past.  Review of systems is otherwise  negative.   PHYSICAL EXAMINATION:  VITAL SIGNS:  Temperature is 99.1, blood pressure  120/70, heart rate is 80.  HEENT:  There are no carotid bruits.  LUNGS:  Clear bilaterally to auscultation.  CARDIAC:  He has a regular rate and rhythm.  ABDOMEN:  Soft and nontender.  EXTREMITIES:  In the right lower extremity, his right groin incision that is  healing adequately.  He has a palpable femoral and popliteal pulse on the  right.  The right foot is warm.  He has cellulitis in the foot with a dry  gangrene of the right great toe.  He has a left below-the-knee amputation.  He had some moderate edema on the right side.  NEUROLOGIC:  Nonfocal.   IMPRESSION:  This patient presents with a progressive diabetic foot  infection on the right despite aggressive outpatient care.  He is being  admitted for emergent open ray amputation of the right great toe.  He will  also be started on intravenous antibiotics.  I have also consulted Dr. Lajoyce Corners  to see the patient tomorrow, as when the infection is cleared he may  potentially require transmetatarsal amputation.  This is obviously a limb-  threatening situation.  I have also discussed the need tonight to  potentially have to take the second and third toes.  All of his questions  were answered and he  was agreeable to proceed.                                               Di Kindle. [name], M.D.    CSD/MEDQ  D:  10/12/2002  T:  10/12/2002  Job:  [id number]

## 2010-09-21 NOTE — Cardiovascular Report (Signed)
[facility]  Patient:    Christopher Padilla, Christopher Padilla                     MRN: [id number] Proc. Date: 06/23/00 Adm. Date:  [id number] Attending:  Lenoria Padilla CC:         Christopher Padilla, M.D.  Cardiopulmonary Lab   Cardiac Catheterization  PROCEDURES PERFORMED: 1. Cardiac catheterization. 2. Subclavian and abdominal aortogram.  INDICATIONS:  Christopher Padilla is 70 years old and was recently seen in consultation by Dr. Loraine Leriche Padilla for exertional chest pain over the past three months. Recently his pain has become nocturnal and Dr. Gerri Padilla arranged for him to come in the hospital for evaluation with angiography. He does have insulin dependent diabetes mellitus and has peripheral vascular disease and has had a previous femoropopliteal bypass on the left and has a left lower extremity amputation as well.  DESCRIPTION OF PROCEDURE:  The procedure was performed via the right femoral artery with an arterial sheath and #6 [place] preformed coronary catheters. A femoral arterial puncture was performed and Omnipaque contrast was used. A subclavian injection was performed to assess the left internal mammary artery for its suitability for bypass grafting. A distal aortogram was performed to rule out abdominal aortic aneurysm.  The patient tolerated the procedure well and left the laboratory in satisfactory condition.  RESULTS: 1. Left main coronary artery:  The left main coronary artery is free of    significant disease. 2. Left anterior descending artery:  The left anterior descending artery gave    rise to three diagonal branches and three septal perforators. The left    anterior descending artery was diffusely diseased and a small caliber    vessel with more focal 80% narrowing in the proximal, mid, and distal    vessel. 3. Circumflex coronary artery:  The circumflex artery gave rise to    large marginal branch which bifurcated into subbranches and a    terminal  branch which bifurcated into three small posterolateral    branches.  The circumflex was also a small caliber vessel that was    diffusely diseased with 90% stenosis in the proximal portion of    the marginal branch and 70% stenosis in one of the subbranches    with diffuse irregularity and small lumen caliber throughout. 4. Right coronary artery:  The right coronary artery is a small to    moderate sized vessel that gave rise to the posterior descending and    two posterolateral branches. These vessels also filled partly    by collaterals from the left coronary system. There was a 90%    proximal stenosis and there were 10% and 70% distal stenoses    with small caliber distal vessels.  LEFT VENTRICULOGRAPHY:  The left ventriculogram was performed in the RAO projection showed hypo/akinesis of the inferior wall. The anterolateral wall out to the apex moved well. The estimated ejection fraction was 40%.  SUBCLAVIAN/ABDOMINAL AORTOGRAM:  A subclavian injection showed no subclavian stenosis and patent vertebral and internal mammary arteries. A distal aortogram showed patent renal arteries. The aorta was irregular but there was no major aortoiliac obstruction.  HEMODYNAMICS:  The aortic pressure was 171/88 with a mean of 118. The left ventricular pressure was 171/38.  CONCLUSION:  Severe diffuse three vessel coronary artery disease with 80% proximal mid and distal stenosis in the left anterior descending artery, multiple 90% and 70% stenoses in the circumflex marginal vessel, and 90% proximal and 70%  distal stenoses in the right coronary artery with  inferior wall hypo/akinesis.  RECOMMENDATIONS:  The patient has severe three vessel disease and left ventricular dysfunction. Unfortunately, his target vessels are very poor. Never the less, I think surgery probably will be his best option and we will plan surgical consultation.  I discussed these findings with Dr. Gerri Padilla. DD:   06/23/00 TD:  06/23/00 Job: [id number]

## 2010-09-21 NOTE — Op Note (Signed)
NAME:  MATTHEO, SWINDLE                        ACCOUNT NO.:  [id number]   MEDICAL RECORD NO.:  [id number]                   PATIENT TYPE:  OIB   LOCATION:  2899                                 FACILITY:  [facility]   PHYSICIAN:  Salvadore Farber, M.D. [facility]         DATE OF BIRTH:  [birth year]   DATE OF PROCEDURE:  10/14/2003  DATE OF DISCHARGE:                                 OPERATIVE REPORT   PROCEDURE:  Arch aortography, cerebral angiography.   PRIMARY OPERATOR:  Salvadore Farber, M.D. [facility].   PROCTOR:  Balinda Quails, M.D.   INDICATION:  Mr. Brubacher is a 70 year old gentleman who presented with  dizziness in late April 2005.  He was evaluated by Dr. Anne Hahn of neurology,  who felt that this may be related to cerebral hypoperfusion.  He underwent  cerebral angiography performed by Dr. Corliss Skains.  Per his report, this  demonstrated a mild stenosis of the proximal right internal carotid with an  80-85% focal stenosis of the right internal carotid at the cervical petrous  junction.  Right vertebral also reportedly had an ostial 70% stenosis with  several 50-70% stenoses more distally.  The left common carotid had trivial  plaque and the left vertebral was reported to be normal.  After consultation  with Drs. Jonette Pesa, and [place], consideration was made for stenting of  the distal ICA stenosis.  The patient is brought to the lab for angiography  the night of percutaneous revascularization.   PROCEDURAL TECHNIQUE:  Informed consent was obtained.  Under 1% lidocaine  local anesthesia, a 5-French sheath was placed in the right common femoral  artery using the modified Seldinger technique.  A 5-French pigtail catheter  was advanced to the ascending aorta.  Arch aortography was performed by  power injection.  A 5-French JR-4 catheter was then advanced over a Wholey  wire and manipulated into the innominate.  Angiography was performed by hand  injection.  The catheter was then advanced over a  Wholey wire into the  proximal portion of the right internal carotid.  Angiography was performed  in multiple views by hand injection.  Catheters were removed over a wire.  Sheath was removed and manual compression applied.  The patient tolerated  the procedure well and was transferred to the holding room in stable  condition.   COMPLICATIONS:  None.   FINDINGS:  1. Aortic arch:  Type 1 aortic arch.  There is no significant     atherosclerosis evident on arteriogram.  There are normal origins of the     great vessels.  2. Right innominate:  Normal vessel.  3. Right carotid:  The common carotid is normal.  There is a very mild     plaquing of the ICA at the bulb.  The external is normal.  There is a 50%     stenosis at the cervical petrous junction.  This is not as severe as was  described previously.  Multiple magnified views were taken to confirm     this absence of severity.  There was also a 30% stenosis at the distal     petrous portion of the ICA.  The caval, cavernous, and supraclinoid     segments are normal.  4. Intracranial portion of the study is to be interpreted by neuroradiology.   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  It is not severe enough to warrant  revascularization.  This was the consensus opinion of Drs. Thurston Hole, and  myself.  Findings were discussed with Dr. Anne Hahn, who concurred.                                               Salvadore Farber, M.D. [facility]    WED/MEDQ  D:  10/14/2003  T:  10/14/2003  Job:  [id number]   cc:   Marlan Palau, M.D.  [address]  [address]  [place]  [place] [address]  Fax: [phone]   P. Liliane Bade, M.D.  [address]  [place]  [place] [address]   Ernestina Penna, M.D.  [address] [place]  [place] [address]  Fax: [phone]

## 2010-09-21 NOTE — Discharge Summary (Signed)
Christopher Padilla, Christopher Padilla              ACCOUNT NO.:  [id number]   MEDICAL RECORD NO.:  [id number]          PATIENT TYPE:  INP   LOCATION:  2034                         FACILITY:  [facility]   PHYSICIAN:  Di Kindle. [name], M.D.DATE OF BIRTH:  05-May-1941   DATE OF ADMISSION:  06/07/2005  DATE OF DISCHARGE:  06/10/2005                                 DISCHARGE SUMMARY   ADMISSION DIAGNOSIS:  Ischemic left below-the-knee amputation.   DISCHARGE DIAGNOSES:  1.  Ischemic left below-the-knee amputation, status post multiple      interventions.  2.  History of peripheral vascular disease.  3.  Coronary artery disease with history of myocardial infarction.  4.  Dyslipidemia.  5.  Hyperthyroidism.  6.  Diabetes mellitus Type 2.  7.  Diabetic peripheral neuropathy.  8.  History of renal insufficiency status post __________ .  9.  Gastroesophageal reflux disease.  10. History of left pleural effusion.   CONSULTATIONS:  None.   OPERATION:  June 07, 2005 the patient underwent left common femoral  artery and deep femoral artery endarterectomy with vein patch angioplasty of  the common femoral artery and deep femoral artery by Dr. Cristal Deer Disk  son.   HISTORY AND PHYSICAL:  Christopher Padilla is a 70 year old Caucasian male who is  well known to the [facility].  The patient has a history of peripheral  vascular disease for which he has undergone revascularization __________ .  the patient is status post right femoral-to-popliteal bypass graft as well  as left femoral-to-posterior tibial bypass and placement of a right iliac  artery stent.  Unfortunately, the patient had to undergo bilateral below-the-  knee amputations.  The patient does uses prostheses and does well with these  for ambulation and mobility.   The patient has a history of coronary artery disease and is status post  coronary artery bypass grafting in 2002 by Dr. Donata Clay.  The patient  presented to the office the day of  admission with complaints over the past  __________  of back pain and has __________  movements.  He states that this  is really depressing.  The patient denies any pain at rest.  He states that  he recently he had increased __________  and frequency.  The patient was  seen by his primary care physician who then referred him to [place] for  evaluation.   The patient was seen by Dr. Madilyn Fireman who performed an aortogram on May 13, 2005.  The patient underwent an aortogram with bilateral iliac arteriogram  and bilateral lower runoff.  This showed that the left common iliac,  external iliac and hypogastric arteries were widely patent.  There was  moderately diffuse disease of the common femoral artery on the left and  severe disease of the deep femoral artery, and also an occluded superficial  femoral artery on the left.  The right side showed a moderate right common  iliac artery stenosis and right distal external and proximal common femoral  artery stenoses.  His right fem-pop bypass graft was patent and his  popliteal artery had severe diffuse disease.  Following  the results of the  aortogram the patient was again seen in the office by Dr. Madilyn Fireman.  Due to the  severity of the stenosis on the left Dr. Madilyn Fireman recommended undergoing left  femoral vein patch angioplasty and profundoplasty.  The patient agreed to  continue.   HOSPITAL COURSE:  Postoperatively the patient progressed as expected.  On  postop day number one the patient's vital signs were stable.  His JP drain  was draining about 40 mL a shift.  The patient was alert and oriented.  The  patient was transferred to 2000 appropriately for mobilization and physical  therapy.   On postop day number two the patient remained stable.  He experienced some  itching with morphine and his pain medication was then changed to Dilaudid.  The patient's JP drain and Foley were discontinued.  The patient was out of  bed walking with physical therapy.   On postoperative day number the patient  was ambulating 100 feet with a rolling walker with his bilateral prostheses,  and he did well.   On postop day number three the patient was comfortable.  His right BKA stump  remained warm.  The patient was discharged to home on June 10, 2005.   PHYSICAL EXAMINATION:  VITAL SIGNS:  Temperature 97, blood pressure 139/67  and pulse 72.  HEART:  The heart has a regular rate and rhythm.  LUNGS:  The lungs are clear to auscultation bilaterally.  EXTREMITIES:  The right BKA stump was warm.  The right leg incision was  clean, dry, intact and healing well.   DISCHARGE CONDITION:  Stable.   DISPOSITION:  The patient will be discharged to home.   DISCHARGE MEDICATIONS:  1.  Nortriptyline 25 mg p.o. at bedtime.  2.  Neurontin 600 mg p.o. at bedtime.  3.  AcipHex 20 mg p.o. daily.  4.  Vytorin 10/40 mg p.o. daily.  5.  Altace 2.5 mg p.o. daily.  6.  Synthroid 12.5 mcg p.o. daily.  7.  __________  800 mg p.o. daily.  8.  Humalog 22 units subcu twice a day.  9.  Lantus 38 units subcu at bedtime.  10. Tylox 1-2 tabs every four hours p.r.n.   DISCHARGE INSTRUCTIONS:   DIET:  The patient was instructed to follow a low-fat, low-salt diabetic  diet.   ACTIVITY:  The patient is to do no driving or heavy lifting for three weeks.  The patient is to increase his activities slowly.  He was instructed to  ambulate about three to four times daily,   WOUND CARE:  The patient may shower and clean his wounds with mild soap and  water.   INSTRUCTIONS:  The patient was instructed to call the office if he  experiences any problems such as erythema, drainage or opening from the  wound site, or a temperature greater than 101.5.   FOLLOW UP:  The patient has a follow up appointment with Dr. [name] in  three weeks.  The office will call after the patient's discharge with a date  and time.     Constance Holster, PA      Di Kindle. [name], M.D.   Electronically Signed    JMW/MEDQ  D:  07/18/2005  T:  07/20/2005  Job:  [id number]

## 2010-09-21 NOTE — H&P (Signed)
NAME:  Christopher Padilla, Christopher Padilla              ACCOUNT NO.:  [id number]   MEDICAL RECORD NO.:  [id number]          PATIENT TYPE:  INP   LOCATION:  5015                         FACILITY:  [facility]   PHYSICIAN:  Janetta Hora. Fields, MD  DATE OF BIRTH:  [birth year]   DATE OF ADMISSION:  06/21/2005  DATE OF DISCHARGE:                                HISTORY & PHYSICAL   CHIEF COMPLAINT:  Infected left groin incision.   HISTORY OF PRESENT ILLNESS:  The patient is a 70 year old Caucasian male  well-known to [facility], having undergone multiple previous  hospitalizations for coronary revascularization as well as peripheral  vascular occlusive disease and ultimately bilateral below-knee amputations.  Most recently he was hospitalized and underwent a surgery on June 07, 2005, by Dr. [name].  At that time the patient was having difficulties with  an ischemic left below-knee amputation, and he underwent placement of a vein  patch angioplasty on the common femoral and deep femoral artery.  Since that  time he has developed a progressive dehiscence of the left groin wound,  which is now draining a clear fluid.  The amount of drainage has increased  over the past several days.  He denies constitutional symptoms.  He denies  fevers, chills or sweats.  The entire left thigh is markedly swollen with  some edema.  Also, he has noted some increasing erythema associated with the  circumferential region of the incision, which has also worsened with time.  He was felt to require admission for further evaluation and treatment,  including intravenous antibiotics as well as possible surgical drainage and  debridement.   PAST MEDICAL HISTORY:  1.  Peripheral vascular occlusive disease.  2.  Insulin-dependent diabetes mellitus.  3.  Coronary artery disease with previous myocardial infarction and coronary      artery bypass grafting.  4.  Dyslipidemia.  5.  Peripheral neuropathy.  6.  Possible renal  insufficiency.  7.  Gastroesophageal reflux.  8.  History of a left pleural effusion.  9.  History of hypothyroidism.  10. History of hypertension.  11. History of difficult intubation in the past.   PAST SURGICAL HISTORY:  1.  Bilateral below-knee amputations.  2.  Coronary artery bypass grafting in 2002 by [name], M.D.  3.  Previous right femoral-popliteal bypass.  4.  Previous femoral-posterior tibial bypass.  5.  Bilateral cataracts.  6.  Right carpal tunnel.  7.  Status post right thoracotomy for hemothorax/fibrothorax.  8.  Previous right iliac artery stent.   ALLERGIES:  No known drug allergies.   CURRENT MEDICATIONS:  1.  Lantus insulin 38 units q.p.m.  2.  Humalog insulin 23 units b.i.d.  3.  Nortriptyline 225 mg q.p.m.  4.  Neurontin 600 mg daily.  5.  Aciphex 20 mg daily.  6.  Vytorin 10/40 mg one daily.  7.  Altace 2.5 mg daily.  8.  Synthroid 0.125 mg daily.  9.  Aspirin 81 mg daily.   REVIEW OF SYSTEMS:  See the history of the present illness for pertinent  positives and negatives.  Otherwise, he  notes some dyspnea on exertion,  otherwise denies full system review.   SOCIAL HISTORY:  He is married with two children.  He quit smoking in 1990.  Alcohol use:  None.  He is retired.   FAMILY HISTORY:  Remarkable for mother who had cancer, deceased age 59.  Father, diabetes mellitus and coronary artery disease, deceased at age 88.   PHYSICAL EXAMINATION:  VITAL SIGNS:  Blood pressure is 118/65, heart rate  73, respirations 20, temperature 97, oxygen saturation 97% on room air.  GENERAL:  This is a 70 year old Caucasian male in no acute distress.  HEENT:  Normocephalic.  Pupils are pinpoint with slight reaction to light.  Extraocular movements are intact.  Oral mucosa is pink with no lesions.  Sclerae are nonicteric.  NECK:  Supple, no jugular venous distention.  He has a right carotid bruit.  No lymphadenopathy.  PULMONARY:  Symmetrical lungs on  inspiration.  Unlabored.  Clear breath  sounds without rales, rhonchi or wheeze.  CARDIAC:  Regular rate and rhythm without murmurs, gallops or rubs.  ABDOMEN:  Soft, moderately distended, normoactive bowel sounds.  No  tenderness, no masses, no bruits, no hepatosplenomegaly.  GENITOURINARY, RECTAL:  Deferred.  EXTREMITIES:  Edema of the left thigh.  There are also erythematous changes  in the region of the left femoral incision.  The incision is also indurated  with a small area of superficial dehiscence at the midportion of the  incision approximately 1 cm in length with a clear to yellow fluid drainage.  NEUROLOGIC:  Nonfocal.  Gait is not tested.  He is alert and oriented x4.  His muscle strength is grossly within normal limits.   ASSESSMENT:  Left groin incision dehiscence with cellulitis as described.   PLAN:  Admission for intravenous antibiotics, local wound care, possible  further surgical treatment as required.     Rowe Clack, P.A.-C.    ______________________________  Janetta Hora Fields, MD   WEG/MEDQ  D:  06/21/2005  T:  06/22/2005  Job:  [id number]

## 2010-09-21 NOTE — Assessment & Plan Note (Signed)
[facility]                            CARDIOLOGY OFFICE NOTE   Christopher Padilla                     MRN:          [id number]  DATE:08/20/2006                            DOB:          1940/06/03    PRIMARY CARE PHYSICIAN:  Christopher Padilla, M.D.   REASON FOR PRESENTATION:  Evaluate patient with coronary disease.   HISTORY OF PRESENT ILLNESS:  Patient is a pleasant 70 year old gentleman  with coronary disease as described below.  He has done well since I last  saw him.  He has had some tragedy in his family with the death of his 71-  year-old daughter.  He has not done as well with his diabetes control  because of that.  He denies any chest discomfort which was his previous  angina.  He has had no neck or arm discomfort. He has had no  palpitations, presyncope or syncope.  He has had no PND or orthopnea. He  is starting to work in his garden.   PAST MEDICAL HISTORY:  1. Coronary artery disease, status post CABG (February 2003, LIMA to      the LAD, left free radial artery to an obtuse marginal, saphenous      vein graft to diagonal, saphenous vein graft to right coronary      artery with endarterectomy)  2. Left below knee amputation.  3. Left arterial bypass (left femoral to posterior tibial bypass      grafting, left femoral artery and deep femoral artery      endarterectomy with vein patch angioplasty of the common femoral      artery and deep femoral artery February 2007, right femoral-      popliteal bypass, right iliac artery restent, bilateral below knee      amputations).  4. Thoracotomy with drainage of a hemathorax and decortication of      fibrothorax.  5. Cerebrovascular disease (MRA in 2005 with 75% stenosis in distal      right vertebral artery and 75% stenosis greater in the distal      cervical internal carotid artery on the right, severe bilateral      disease and MRA of the extracranial circulation, high grade  stenosis of the distal right internal carotid artery at the      junction of the cervical internal carotid artery of the skull      base).  6. Diabetes mellitus.  7. Hyperlipidemia.  8. Hypothyroidism.  9. Previous tobacco use, quit in 1990.   ALLERGIES:  CODEINE.   MEDICATIONS:  1. Synthroid 175 mcg daily.  2. Altace 2.5 mg daily.  3. Vytorin 10/40 daily.  4. Gabapentin 1200 mg daily.  5. Fish oil.  6. AcipHex.  7. Lantus.  8. Humalog.  9. Aspirin 81 mg daily.  10.Nortriptyline.   REVIEW OF SYSTEMS:  As stated in the HPI and otherwise negative for  other systems.   PHYSICAL EXAMINATION:  GENERAL APPEARANCE:  Patient is in no distress.  VITAL SIGNS:  Blood pressure 120/72, heart rate 83 and regular.  HEENT:  Eye lids unremarkable.  Pupils  are equal, round and reactive to  light.  Fundi not visualized.  Oral mucosa unremarkable.  NECK:  No jugular venous distension.  Wave form within normal limits.  Carotid upstroke brisk and symmetric, right greater than left carotid  bruit.  No thyromegaly.  LYMPHATICS:  No lymphadenopathy.  LUNGS:  Clear to auscultation bilaterally.  BACK:  No costovertebral angle tenderness.  CARDIOVASCULAR:  PMI not displaced or sustained.  S1 and S2 within  normal limits.  No S3, no S4, no murmurs.  ABDOMEN:  Flat, positive bowel sounds, normal in frequency and pitch, no  bruits, no rebound, no guarding.  EXTREMITIES:  There is 2+ right radial, 2+ femorals with bilateral  bruits.  NEUROLOGIC:  Grossly intact.   EKG sinus rhythm, rate 83, old inferior infarct, old anteroseptal  infarct, lateral T-wave inversions unchanged from previous.   ASSESSMENT/PLAN:  1. Coronary disease:  The patient is having no new symptoms.  Dr.      Christell Padilla is following his secondary risk reduction.  No further      cardiovascular testing is suggested.  2. Follow-up:  He can come and see me in about 18 months or if he has      any new symptoms consistent or questionable  for new angina.     Christopher Rotunda, MD, [facility]  Electronically Signed    JH/MedQ  DD: 08/20/2006  DT: 08/20/2006  Job #: [id number]   cc:   Christopher Padilla, M.D.

## 2010-09-21 NOTE — Op Note (Signed)
[facility]  Patient:    Christopher Padilla, Christopher Padilla                     MRN: [id number] Proc. Date: 06/30/00 Adm. Date:  [id number] Attending:  Mikey Bussing CC:         Daisey Must, M.D. [facility]   Operative Report  PREOPERATIVE DIAGNOSES:  Class IV unstable angina with severe three-vessel coronary disease, inferior wall myocardial infarction, severe diabetes.  POSTOPERATIVE DIAGNOSES:  Class IV unstable angina with severe three-vessel coronary artery disease, inferior wall myocardial infarction, severe diabetes.  PROCEDURE:  Coronary artery bypass grafting x 4 and coronary endarterectomy (left internal mammary artery to the left anterior descending coronary artery, left free radial artery graft to obtuse marginal, saphenous vein graft to diagonal, saphenous vein graft to distal right coronary artery with endarterectomy).  SURGEON:  Mikey Bussing, M.D.  ASSISTANT:  Areta Haber, P.A.  ANESTHESIA:  General by Edwin Cap. Zoila Shutter, M.D.  INDICATIONS:  The patient is a 70 year old insulin-dependent diabetic, status post left BKA, who presents with class IV rest nocturnal angina.  Cardiac catheterization by Dr. Gerri Spore demonstrated severe three-vessel coronary disease with an inferior wall MI and an ejection fraction of 40%.  He was referred for surgical revascularization.  Prior to the operation, I examined the patient in the hospital telemetry room and reviewed the results of the cardiac catheterization with the patient and his family.  I discussed the indications and expected benefits of coronary bypass surgery.  I discussed the alternatives to surgical therapy for his severe coronary disease.  I reviewed the major aspects of the operation, including the choice of conduit, the planned use for radial artery graft due to his severe peripheral vascular disease and left BKA, the placement of the surgical incisions, use of  general anesthesia and cardiopulmonary bypass, and the expected postoperative recovery period.  I reviewed with the patient and his family the risks associated with this operation, including the risks of MI, CVA, bleeding, infection, and death.  He understood the implications for surgery and agreed to proceed with the operation as planned under informed consent.  OPERATIVE FINDINGS:  The patients coronaries are severely and diffusely diseased and would be poor targets for any redo grafting procedure.  The saphenous vein was of average quality and was not usable below the knee.  The mammary artery was small and had adequate flow.  The radial artery had some sclerotic thickening changes in its wall, but it was an adequate conduit.  A unit of blood was given on pump for a hematocrit of less than 20%.  Prior to harvesting the radial artery, we obtained a good Doppler pulse in the ulnar artery after clamping the radial artery.  DESCRIPTION OF PROCEDURE:  The patient was brought to the operating room and placed supine on the operating table, where general anesthesia was induced under invasive hemodynamic monitoring.  The left arm was prepped and draped as a sterile field, and the left radial artery graft was removed and flushed with a papaverine-heparin solution.  The arm was closed in layers using Vicryl, and then a sterile dressing was applied and the arm was tucked to the left side. The patient was then prepped and draped as a sterile field.  A median sternotomy was performed as the saphenous vein was harvested from the right thigh.  The internal mammary artery was harvested as a pedicle graft from its origin at the subclavian  vessels.  Heparin was administered, and the ACT was documented as being therapeutic.  The sternal retractor was placed through pursestrings placed in the ascending aorta and right atrium.  The patient was cannulated and placed on bypass and cooled to 32 degrees.  The  coronaries were identified, and the mammary artery, radial artery, and vein grafts were prepared for the distal anastomoses.  Cardioplegia cannula was placed for both antegrade and retrograde delivery of cold blood cardioplegia, and the patient was cooled to 28 degrees.  As the aortic crossclamp was applied, a total of 700 cc of antegrade and retrograde cold blood cardioplegia was administered with good cardioplegic arrest and septal temperature dropping to less than 12 degrees.  Topical iced saline flush was used to augment myocardial preservation, and a pericardial insulator pad was used to protect the left phrenic nerve.  The distal coronary anastomoses were then performed.  The first distal anastomosis was the distal right coronary artery.  This was heavily diseased, sclerotic, and the plaque was very thick and almost obliterated the lumen.  An endarterectomy was performed with a good endarterectomy specimen just at the distal right before the bifurcation to the posterior descending.  This endarterectomy specimen was feathered at its distal aspect, and the vessel was irrigated with cold heparinized saline.  An end-to-side anastomosis with a reversed saphenous vein was sewn at the endarterectomized site using running 7-0 Prolene.  There was good flow through the graft.  The second distal anastomosis was to the first diagonal, which was a small 1.0 mm vessel with proximal 90% stenosis.  A reversed saphenous vein was sewn end-to-side with running 7-0 Prolene, and there was adequate flow through the graft.  The third distal anastomosis was to the obtuse marginal, which was a 1.5 mm vessel with proximal 95% stenosis.  The left radial artery was sewn in an end-to-side fashion with running 8-0 Prolene, and there was good flow through the graft. Cardioplegia was redosed.  The fourth distal anastomosis was to the distal LAD.  This was a 1.5 mm vessel with proximal 95% stenosis.  The left  internal mammary artery pedicle was brought through an opening created in the left lateral pericardium and was brought down on the LAD and sewn end-to-side with  running 8-0 Prolene.  There was good flow through the anastomosis.  A dose of warm blood retrograde cardioplegia was then given as the mammary artery was perfused, and there was immediate rise in septal temperature.  The mammary pedicle was secured to the epicardium, and the aortic crossclamp was removed.  The heart resumed a spontaneous rhythm.  The patient was rewarmed to 37 degrees, then three proximal anastomoses were placed with the radial artery graft placed superiorly on the ascending aorta and the vein grafts placed beneath it.  The partial clamp was removed, and the vein grafts and radial artery graft were perfused.  Each had good flow, and hemostasis was documented at the proximal and distal sites.  The patient was rewarmed and reperfused, and temporary pacing wires were applied.  When the patient reached 37 degrees, the lungs were re-expanded and the ventilator was resumed.  The patient was then weaned from cardiopulmonary bypass on renal-dose dopamine with good blood pressure and stable cardiac output.  Protamine was administered, and the cannula was removed.  The cardioplegia cannulas were removed.  The pericardium was irrigated with warm antibiotic irrigation.  The leg incision was irrigated and closed.  A Jackson-Pratt drain was used to drain the thigh  incision.  The pericardium was loosely reapproximated superiorly over the aorta and vein grafts.  Two mediastinal and a left pleural chest tube were placed and brought out through separate incisions.  The sternum was reapproximated with eight interrupted steel wires and the pectoralis fascia and subcutaneous layers closed with running Vicryl.  The closed was closed with a subcuticular, and sterile dressings were applied.  Total cardiopulmonary bypass time was  200 minutes with aortic crossclamp time of 110 minutes. DD:  06/30/00 TD:  07/01/00 Job: [id number]

## 2010-09-21 NOTE — Discharge Summary (Signed)
[facility]  Patient:    Christopher Padilla, Christopher Padilla                     MRN: [id number] Adm. Date:  [id number] Disc. Date: 07/12/00 Attending:  Mikey Bussing Dictator:   Adair Patter, P.A.-C. CC:         Mikey Bussing, M.D.  Bruce Elvera Lennox Juanda Chance, M.D. [facility]   Discharge Summary  DATE OF BIRTH:  [birth year]  ADMIT DIAGNOSIS:  Coronary artery disease.  SECONDARY DIAGNOSES: 1. Insulin-dependent diabetes mellitus. 2. Peripheral vascular disease. 3. Hypothyroidism. 4. Postoperative atrial fibrillation. 5. Postoperative ileus.  DISCHARGE DIAGNOSES: 1. Coronary artery disease. 2. Status post coronary artery bypass graft.  HOSPITAL PROCEDURES:  Coronary artery bypass graft x 4 with coronary endarterectomy.  HOSPITAL COURSE:  Mr. Delellis was admitted to [facility] on June 30, 2000 secondary to severe coronary artery disease.  On this day he underwent elective coronary artery bypass graft with coronary endarterectomy. The left internal mammary artery was anastomosed to the left anterior descending, the left radial artery was anastomosed to obtuse marginal, saphenous vein graft was anastomosed to the diagonal artery and saphenous vein graft was anastomosed to the right coronary artery.  Coronary endarterectomy was also done on his right coronary artery.  This procedure was done electively by Dr. Kathlee Nations Trigt under general endotracheal anesthesia with cardiopulmonary bypass.  Postoperatively patient experienced some anemia which required transfusion of packed red blood cells.  His postoperative course was also complicated by postoperative atrial fibrillation.  This was controlled with digoxin and Toprol.  Postoperatively patient also had a postoperative course complicated by ileus.  This was alleviated by slowly advancing patients diet.  Eventually his GI function returned to normal.  In addition to those complications mentioned,  patients postoperative course was also complicated by slow progress with ambulation.  Because of this physical therapy was consulted who worked with patient daily.  Eventually patient began to ambulate more independently on his own.  He was subsequently discharged home in stable and satisfactory condition on July 12, 2000.  MEDICATIONS:  1. Ultram 50 mg one to two tablets every four to six hours as needed for     pain.  2. Imdur 30 mg one tablet daily.  3. Synthroid 125 mcg one tablet daily.  4. Plavix 75 mg one tablet daily.  5. Digoxin 0.062 mg daily.  6. Protonix 40 mg one tablet daily.  7. Multivitamin one daily.  8. Altace 2.5 mg one tablet daily.  9. Lipitor 10 mg one tablet daily. 10. Toprol XL 25 mg one tablet daily. 11. Lantus 30 units in the evening.  DISCHARGE ACTIVITY:  Patient told to avoid driving, strenuous activity, and lifting objects over 10 pounds.  DISCHARGE DIET:  1800 calorie ADA diet.  WOUND CARE:  Patient told he could shower and clean his incisions with soap and water.  DISPOSITION:  Home.  FOLLOWUP:  Patient was told to call his cardiologist, Dr. Charlies Constable, for appointment in two weeks.  He was told to bring chest x-ray taken by Dr. Juanda Chance to Dr. Vincent Gros office.  He was also instructed to follow up with Dr. Kathlee Nations Trigt on Friday, July 25, 2000 at 1 p.m. DD:  07/11/00 TD:  07/12/00 Job: [id number]

## 2010-09-21 NOTE — Consult Note (Signed)
NAME:  Christopher Padilla                        ACCOUNT NO.:  [id number]   MEDICAL RECORD NO.:  [id number]                   PATIENT TYPE:  INP   LOCATION:  2029                                 FACILITY:  [facility]   PHYSICIAN:  Marlan Palau, M.D.               DATE OF BIRTH:  July 28, 1940   DATE OF CONSULTATION:  08/30/2003  DATE OF DISCHARGE:                                   CONSULTATION   HISTORY OF PRESENT ILLNESS:  Christopher Padilla is a 70 year old right-handed  white male born 10-03-1940 with a history of diabetes, peripheral  vascular disease, coronary artery disease.  The patient comes to the  emergency room for evaluation of significant dizziness problems.  The  patient reported vertigo problems that began 3 months ago lasting only a  short period of time.  This was associated with true vertigo that occurred  upon awakening.  The patient when he rolled over on his left side it was a  bit more prominent than on his right.  The patient did have some nausea and  vomiting with this. The patient was symptom-free for several months but had  recurrence about 10 days ago.  The patient noted onset of similar problems  with true vertigo by rolling over on his side in bed.  The patient notes  that his head would swim, have nausea.  The patient has had some gradual  improvement over time but today woke up again with dizziness, did not feel  quite right.  The patient had to sit still all day long.  Did have nausea  earlier today but did not vomit.  The patient does not report any headache,  numbness, or weakness on the face, arms or legs, hearing changes, ringing in  the ears, ear pain.  The patient denies blackout episodes.  The patient was  brought to the emergency room for an evaluation.  A CT scan of the head  shows some old small vessel changes particularly in the right deep  structures.  Neurology is asked to see this patient for further evaluation.   PAST MEDICAL HISTORY:  1.  History of dizziness, vertigo initially was probable benign positional     vertigo.  2. Diabetes.  3. Peripheral vascular disease status post fem-pop bypass on the left.  4. History of bilateral carotid bruits by examination.  5. History of CABG procedures x4 in the past.  6. History of bilateral below-knee amputations.  7. History of right carpal tunnel syndrome surgery.  8. Cataract surgery.  9. Hypothyroidism.  10.      History of hypercholesterolemia.   MEDICATIONS AT THIS TIME:  1. Synthroid 0.125 mg daily.  2. Altace 2.5 mg a day.  3. Aciphex 20 mg daily.  4. Nortriptyline 25 mg at night.  5. Neurontin 300 mg p.o. q.h.s.  6. Zocor 40 mg q.h.s.  7. Lantus insulin 32 units subcu q.h.s.  8. Baby aspirin 81 mg daily.   The patient, again, has no known allergies.  Does not currently smoke or  drink.  The patient quit smoking in 1999.   SOCIAL HISTORY:  This patient is married, lives in the [place], [place]  [place] area.  Has 2 children.  Son is in good health.  Daughter has  history of asthma.   FAMILY MEDICAL HISTORY:  Notable that mother died age 50 with cancer  involving the lung and brain.  Father died with emphysema.  The patient has  2 brothers one sister.  Sister has diabetes.   REVIEW OF SYSTEMS:  Is notable for no fevers, chills.  The patient  occasionally has some choking problems.  Does note some shortness of breath  at times.  Denies chest pains.  Denies problems with abdominal pain,  problems controlling bowels or the bladder or blackout episodes.  The  patient does not have a history of seizures.   PHYSICAL EXAMINATION:  VITAL SIGNS:  Blood pressure is 117/47, heart rate  86, respiratory rate 18, temperature afebrile.  GENERAL:  This patient is a fairly well-developed white male who is alert  and cooperative at the time of examination.  HEENT:  Head is atraumatic.  Eyes:  Pupils are 0.5 to 1 mm trace reactive.  Disks are flat.  End-gaze nystagmus is seen  bilaterally, right greater than  left.  NECK:  Supple.  Bilateral carotid bruits are noted, more prominent on the  right than the left.  There is a blowing, higher pitched quality to the  right carotid bruit.Marland Kitchen  RESPIRATORY:  Clear.  CARDIOVASCULAR:  Regular rate and rhythm.  No obvious murmurs or rubs noted.  EXTREMITIES:  Notable in that there are bilateral below-knee amputations.  NEUROLOGICAL:  Cranial nerves as above.  Facial symmetry is present.  The  patient has good sensation to face to pinprick and soft touch bilaterally.  Has good strength to facial muscles and muscles of the head turning shoulder  shrug bilaterally.  Speech is well enunciated, not aphasic.  Again, visual  fields are full.  Does double simultaneous stimulation.  Motor testing  reveals good strength proximally at both arms.  The patient has some  intrinsic muscle weakness in both hands, left greater than right.  Good  strength to the lower extremities is noted.  The patient has good pinprick  sensation throughout.  The patient feels there is decreased vibratory  sensation.  The left hand is greater than the right. The patient has fair  finger-nose-finger bilaterally.  Deep tendon reflexes are symmetric  throughout.  The patient was not ambulated.   LABORATORY VALUES:  Notable for a white count of 7.9, hemoglobin notable for  11.3, hematocrit of 35.1, MCV of 80.5, platelets of 415.  INR of 1.  Sodium  129, potassium 4.4, chloride of 95, CO2 25, glucose 183, BUN 17, creatinine  1.2, calcium of 8.8, total protein 7.8, albumin 2.9, AST 18, ALT 26,  alkaline phosphatase 158, total bili 0.5.  Urinalysis reveals specific  gravity 1.021, pH of 5, glucose of 250 mg/dL, protein 30 mg/dL, 0-2 white  cells, 0-2 red cells.  CK of 40, MB fraction 1.6.  Troponin-I is 0.03.  CT  of the head is as above.   IMPRESSION: 1. History of dizziness initially sounding as if it was consistent with     benign positional vertigo.  2.  Bilateral carotid bruits by examination.  3. Small vessel disease by CT.  4. Diabetes.  5. Hyponatremia.   The patient appears to have a history consistent with benign positional  vertigo but does not report true vertigo at this time.  The patient does  have a history putting him at high risk for cerebrovascular disease, does  have bilateral carotid bruits.  Further workup is indicated.   PLAN:  1. MRI scan of the brain.  2. MRI angiogram with intracranial and extracranial vessels.  3. Carotid Doppler studies.  4. Will follow the patient's clinical course while in house.  The patient at     this time does not report any associated symptoms that would make you     extremely concerned about cerebrovascular disease such as headache, focal     numbness on the arms or legs, double vision, loss of vision.                                               Marlan Palau, M.D.    CKW/MEDQ  D:  08/30/2003  T:  08/30/2003  Job:  [id number]   cc:   Duke Salvia, M.D.   Ernestina Penna, M.D.  [address] [place]  [place] [address]  Fax: [phone]

## 2010-09-21 NOTE — H&P (Signed)
NAME:  LAVONE, WEISEL              ACCOUNT NO.:  [id number]   MEDICAL RECORD NO.:  [id number]          PATIENT TYPE:  INP   LOCATION:  NA                           FACILITY:  [facility]   PHYSICIAN:  Di Kindle. [name], M.D.DATE OF BIRTH:  1940-11-17   DATE OF ADMISSION:  06/07/2005  DATE OF DISCHARGE:                                HISTORY & PHYSICAL   CHIEF COMPLAINT:  Left calf pain.   HISTORY OF PRESENT ILLNESS:  Mr. Rathgeber is a 70 year old Caucasian male who  is well known to the CVTS practice. The patient has a history of peripheral  vascular disease for which he has undergone revascularization attempts. He  is status post right femoral-to-popliteal bypass graft as well left-femoral-  to-posterior-tibial bypass graft and placement of right iliac artery stent.  Unfortunately, the patient ended up having to undergo bilateral below-knee  amputations.  The patient does use prostheses and does well with this for  ambulation and mobility.  The patient has a history of coronary artery  disease and is status post coronary artery bypass grafting in 2002 by Dr.  Donata Clay.  The patient presents to the office today with complaints that  over the past 1-1/2 years had been experienced pain in his left calf with  movement.  He states that this is relieved with rest. The patient denies any  pain at rest.  He states that recently he has had increased severity and  frequency.  The patient was seen by his primary care physician who then  referred him back to [place] for evaluation.   The patient was seen by Dr. Madilyn Fireman who recommended the patient undergo an  aortogram which was performed on May 13, 2005.  The patient underwent  aortogram with bilateral iliac arteriogram and bilateral lower extremity  runoff.  This showed that the left common iliac, external iliac, and  hypogastric arteries are widely patent.  There was moderate diffuse disease  of the common femoral artery on the left and severe  disease of the deep  femoral artery with an occluded superficial femoral artery on the left.  The  right showed moderate right common iliac artery stenosis and right distal  external and proximal common femoral artery stenoses.  His right femoral-  popliteal bypass graft is patent in his popliteal artery which has severe  diffuse disease.  Following results of the aortogram, the patient was again  seen back in the office by Dr. Madilyn Fireman.  Due to the severity of the stenosis  on the left, Dr. Madilyn Fireman recommended undergoing left femoral vein patch  angioplasty and profundoplasty.  Plan is to take vein from the patient's  right arm to do the patch angioplasty.  The patient had a venogram on his  right arm done for evaluation.  The largest site was seen to be in the  brachial vein measuring 0.74 noted in the upper arm.  The patient denies any  decreased temperature or motor function.  He denies any ulcerations, rest  pain, tenderness, numbness, or tingling on the left side.   PAST MEDICAL HISTORY:  1.  History of  peripheral vascular disease.  2.  Coronary artery disease with history of myocardial infarction.  3.  Dyslipidemia.  4.  Hypothyroidism.  5.  Diabetes mellitus, type 2.  6.  Diabetic peripheral neuropathy.  7.  History of renal insufficiency status post CABG.  8.  Gastroesophageal reflux disease.  9.  History of left pleural effusion.   PAST SURGICAL HISTORY:  1.  Status post coronary artery bypass grafting in 2002 by Dr. Donata Clay.  2.  Status post bilateral below-knee amputations.  3.  Status post right femoral-popliteal bypass graft.  4.  Status post bilateral cataract removal.  5.  Status post right carpal tunnel surgery.  6.  Status post right iliac artery stent placement.  7.  Status post left-femoral-to-posterior-tibial bypass grafting.  8.  Status post thoracotomy with drainage of hemothorax and decortication of      fibrothorax.   ALLERGIES:  No known drug allergies.    MEDICATIONS:  1.  Lantus 38 units at night.  2.  Humalog 23 units twice daily.,  3.  Nortriptyline 225 mg at night.  4.  Neurontin 600 mg daily.  5.  Aciphex 20 mg daily.  6.  Vitorin 10/40 daily.  7.  Altace 2.5 mg daily.  8.  Synthroid 12.5 mg daily.  9.  Aspirin 81 mg daily.   SOCIAL HISTORY:  The patient is currently married with 2 children, lives at  home with his wife.  The patient has a history of tobacco use, quit smoking  in 1990. Denies any alcohol use.  The patient is retired, living in [place].  He currently still drives.   FAMILY HISTORY:  Mother positive for cancer, deceased at age 75.  Father  positive for coronary artery disease and diabetes mellitus, deceased at age  53.   REVIEW OF SYSTEMS:  See HPI for pertinent positives and negatives.  The  patient denies any fevers, night sweats, chills.  He denies any recent  changes in vision, hearing, or swallowing.  Denies any recent headaches.  The patient does complain of shortness of breath with exertion which he  states has been consistent since his thoracotomy back in 2005.  He has not  noted any change with this.  The patient denies any chest pain, orthopnea,  paroxysmal nocturnal dyspnea, history of arrhythmias.  The patient denies  nausea, vomiting, abdominal pain, change in bowel movement.  No melena or  hematochezia.  The patient denies any recent changes in muscle strength.  He  does have a history of neuropathy.  He has noted that his right BKA site has  been tingling recently at rest.   PHYSICAL EXAMINATION:  GENERAL: Well-developed, well-nourished white male in  no acute distress.  VITAL SIGNS:  Blood pressure 132/74, pulse 80, respirations 18.  The patient  is 5 feet 8 inches tall, weighs 178 pounds.  HEENT: Normocephalic and atraumatic.  Pupils equal, round, and reactive to  light and accommodation.  Oral mucosa pink and moist.  NECK: Supple. RESPIRATORY: Diminished breath sounds left lower lobe.  There  is a well-  healed scar from his left thoracotomy site.  CARDIAC: Regular rate and rhythm with S1 and S2 noted.  There is a 2/6  systolic ejection murmur noted.  Well-healed scar from his prior CABG.  ABDOMEN:  Bowel sounds x4.  Soft, nontender to palpation.  No  hepatosplenomegaly noted.  GENITALIA: Deferred.  RECTAL:  Deferred.  EXTREMITIES: No clubbing, cyanosis, or edema noted.  The patient's bilateral  extremities  are warm to touch.  He has 2+ radial pulse on the right.  There  is no pulse on the left.  The patient is status post removal of left radial  first CABG. He has 1+ bilateral femoral pulses noted.  NEUROLOGIC: Cranial nerves II-XII intact. The patient is alert and oriented  x4.  Gait is steady with use of prostheses bilaterally lower extremities.  Muscle strength is 5/5 upper and lower extremities bilaterally.   IMPRESSION AND PLAN:  The patient is seen with left leg claudication.  The  patient was seen and evaluated by Dr. Madilyn Fireman.  Dr. Madilyn Fireman had discussed  patient undergoing left femoral patch angioplasty with right arm vein with  profundoplasty.  Due to patient's prior history of revascularization by Dr.  [name], Dr. Madilyn Fireman has scheduled this patient for the procedure with Dr.  [name] for June 07, 2005.  Risks and benefits of the procedure were  discussed with the patient.  He acknowledges understanding and agrees to  proceed.  The patient is scheduled for surgery June 07, 2005.  He will be  in preop today at short-stay at 2:30.      Theda Belfast, PA      Di Kindle. [name], M.D.  Electronically Signed    KMD/MEDQ  D:  06/05/2005  T:  06/05/2005  Job:  [id number]

## 2010-09-21 NOTE — Discharge Summary (Signed)
. [facility]  Patient:    BENEN, WEIDA                     MRN: [id number] Adm. Date:  [id number] Disc. Date: [id number] Attending:  Mikey Bussing Dictator:   Joellyn Rued, P.A.-C.                  Referring Physician Discharge [facility]  DATE OF BIRTH:  [birth year]  SUMMARY OF HISTORY:  Mr. Valent is a 70 year old white male with a history of diabetes and hyperlipidemia and significant peripheral vascular disease and status post lower extremity bypass and left below-knee amputation.  He began having symptoms of chest discomfort in approximately September of 2001.  He described the symptoms across his chest associated with exertion and relieved with rest.  He had four to five episodes occurring sporadically.  However, over the past two months, he has been awakening at night with the above symptoms.  He would generally wake up between 1 and 2 a.m. with the tightness. It would last as long as 10-15 minutes and then resolve.  He feels that the episodes would be shorter if he sat up.  Initially they began two to three times per week and have decreased in frequency to one time per week.  The last episode was approximately six to seven days prior to his evaluation in the office on June 20, 2000.  LABORATORY DATA:  The PT was 11.3 and the PTT was 19.3.  Hemoglobin 13.7, hematocrit 42.7, WBC 6.2, platelets 260.  Sodium 137, potassium 4.8, BUN 18, creatinine 1.2.  Lipids on June 23, 2000, showed a cholesterol of 215, triglycerides 126, HDL 44, and LDL 146.  HOSPITAL COURSE:  The patient was admitted for cardiac catheterization.  This was performed on June 23, 2000, by Everardo Beals. Juanda Chance, M.D.  According to his progress notes, he had multiple 80% lesions in the proximal mid and distal LAD.  In the circumflex, he had multiple 70-90% lesions in the proximal mid circumflex.  The RCA had a 90% lesion and two 70% distal lesions.  The EF was 40% with  inferior hypokinesis to akinesis.  It was felt that he had severe three-vessel coronary artery disease with poor target vessels, but that bypass surgery may be his only option.  Shots were taken of his renals, aorta, and iliacs and these were free of significant disease.  CVTS was consulted.  [name] III, M.D., felt that he would benefit from a high-risk bypass surgery.  Vein mapping was performed.  DISPOSITION:  Apparently he left on leave of absence for readmission for bypass surgery on June 30, 2000.  DISCHARGE MEDICATIONS: 1. Lipitor 10 mg q.h.s. 2. Altace 2.5 mg q.d. 3. Coated aspirin 325 mg q.d. 4. Insulin 70/30 32 units q.a.m. and 24 units q.p.m. 5. Synthroid 0.125 mg q.d. 6. Neurontin 300 mg q.d. 7. Vitamin E 400 iu q.d. 8. Sublingual nitroglycerin as needed.  SPECIAL INSTRUCTIONS:  He was instructed to return to the emergency room if pain recurs or is unrelieved with nitroglycerin.  ACTIVITY:  He was advised no lifting, driving, sexual activity, or heavy exertion for two days.  DIET:  Maintain a low-salt, low-fat, and low-cholesterol diet.  WOUND CARE:  If he has any problems with his catheterization site, he was asked to call immediately. DD:  07/30/00 TD:  07/30/00 Job: [id number]

## 2010-09-21 NOTE — Discharge Summary (Signed)
NAME:  Christopher Padilla, Christopher Padilla                        ACCOUNT NO.:  [id number]   MEDICAL RECORD NO.:  [id number]                   PATIENT TYPE:  INP   LOCATION:  3302                                 FACILITY:  [facility]   PHYSICIAN:  Ranelle Oyster, M.D.             DATE OF BIRTH:  06-05-1940   DATE OF ADMISSION:  09/06/2003  DATE OF DISCHARGE:  09/14/2003                                 DISCHARGE SUMMARY   ADMITTING DIAGNOSIS:  Left pleural effusion.   PAST MEDICAL HISTORY:  1. Peripheral vascular disease, status post right iliac artery stent and     right femoral popliteal bypass graft and bilateral below-the-knee  amputations.  1. Coronary artery disease, status post CABG February 2002 by Dr. Kathlee Nations     Trigt.  2. Dyslipidemia.  3. Hypothyroidism.  4. Diabetes mellitus, type 2, now insulin-dependent.  5. Diabetic peripheral neuropathy.  6. GERD.   SURGICAL HISTORY:  1. Cataract removal.  2. Right carpal tunnel surgery.   ALLERGIES:  The patient is not allergic to any medications.   DISCHARGE DIAGNOSES:  1. Left pleural effusion, status post left video-assisted thorascopic     surgery and drainage of effusion.  Pathology: Inflammation.   BRIEF HISTORY:  Mr. Mattioli is a 70 year old Caucasian man.  He was  discharged from [facility] on April 29 after workup for dizziness.  During that workup, he underwent a CT scan that showed a large left effusion  with left lower lobe lung collapse.  There was also a thickening of the  pleura.  He underwent a thoracentesis which yielded about 500 mL of dark,  bloody fluid.  Symptomatically, he has had some shortness of breath, also  nonproductive cough.  During that hospitalization, cytology on the  thoracentesis fluid was returned with evidence of inflammation.  Dr. Edwyna Shell  was consulted for consideration of surgical intervention.  After examination  of the patient, review of the available record, Dr. Edwyna Shell agreed that Mr.  Sciandra would benefit from a VATS.  He was discharged home on April 29 with  plans for admission to the hospital for elective surgery on Sep 06, 2003.   HOSPITAL COURSE:  On Sep 06, 2003, Mr. Forcier was electively admitted to  [facility] under the care of Dr. Algis Downs. Karle Plumber.  He underwent  the following surgical procedures: Left video-assisted thoracoscopy, left  thoracotomy, and decortication of the left lung, drainage of chronic  hemothorax and fibrothorax.  He tolerated the procedure well transferring in  stable condition to the PACU.  He was extubated immediately following  surgery and awoke from anesthesia neurologically intact.  He remained  hemodynamically stable in the immediate postoperative period.   Final pathology was returned without evidence of malignancy.  Multiple  pleural biopsies were fibrous plaque with chronic inflammation.   Mr. Pavich postoperative course was uneventful.  He made good progress  recovering from surgery.  His  chest tube was removed in serial fashion on  postoperative day #3 and #6.  Case management followed his progress  throughout his hospitalization and home health services were arranged for a  registered nurse to assist with dressing changes as well as physical and  occupational therapy.  Next, also during this hospitalization, Dr. Denman George of vascular surgery was consulted regarding evaluation for  consideration for carotid stenting.  His plan was to review his arteriograms  and he will follow up with Mr. Dorner in the outpatient setting.   On Sep 14, 2003,  postoperative day #7, on morning rounds, Mr. Lyster  reports feeling well.  Vital signs were stable, 130/50.  He was afebrile,  his room air saturation was 93%.  His breathing was unlabored.  He did have  some rhonchi on the left chest.  His incision was healing well.  There was  some minimal drainage from his chest tube site.  He was tolerating is diet  without  nausea.  He was ambulating in the hall without difficulty and his  pain control was adequate.  Mr. Hewes was ready for discharge on the  morning of 09/14/03.   CONDITION ON DISCHARGE:  Improved.   INSTRUCTIONS ON DISCHARGE:   MEDICATIONS:  He is to resume his home medicines of:  1. Altace 2.5 mg daily.  2. Aspirin 81 mg daily  3. Toprol 40 mg q.h.s.  4. Synthroid 125 mcg daily.  5. Aciphex 20 mg daily.  6. Amitriptyline 25 mg q.h.s.  7. Neurontin 300 mg q.h.s.  8. Lantus Insulin 32 units q.h.s.  9. Humalog Insulin, home sliding scale before meals.   PAIN MANAGEMENT:  He can have Tylox 1-2 p.o. q.4h. p.r.n for moderate to  severe pain or Tylenol 325 mg 1-2 p.o. q.4h. mild pain.   ACTIVITY:  He is asked to refrain from driving or heavy lifting.  Also  instructed to continue his breathing exercises and gently walking.   DIET:  As tolerated.   WOUND CARE:  He is to shower daily.  If his incision shows any type of  infection, fever greater than 100 degrees Fahrenheit, he is call to Dr.  Scheryl Darter office.   FOLLOWUP:  1. Home health services have been arranged through [facility].  2. Dr. Edwyna Shell would like to see him back in the CVTS office on Thursday, May     19, at 4 p.m.  He will be asked to     have a chest x-ray at __________ at 3 p.m. that day and bring his chest x-     ray with him for Dr. Edwyna Shell to review.  Dr. Madilyn Fireman will follow up with Mr.     Hays at CV[facility] office approximately two weeks after discharge.  The     office will call with a date and time for that appointment.                                                Ranelle Oyster, M.D.    ZTS/MEDQ  D:  10/05/2003  T:  10/06/2003  Job:  [id number]   cc:   Ernestina Penna, M.D.  [address] [place]  [place] [address]  Fax: [phone]   [facility]   Oley Balm. Sung Amabile, M.D. [facility]

## 2010-09-21 NOTE — Op Note (Signed)
NAME:  Christopher Padilla, Christopher Padilla              ACCOUNT NO.:  [id number]   MEDICAL RECORD NO.:  [id number]          PATIENT TYPE:  AMB   LOCATION:  SDS                          FACILITY:  [facility]   PHYSICIAN:  Di Kindle. [name], M.D.DATE OF BIRTH:  1941-03-03   DATE OF PROCEDURE:  05/13/2005  DATE OF DISCHARGE:                                 OPERATIVE REPORT   PREOPERATIVE DIAGNOSIS:  Pain in the left leg.   POSTOPERATIVE DIAGNOSIS:  Pain in the left leg.   PROCEDURES:  Aortogram with bilateral iliac arteriogram and bilateral lower  extremity runoff.   SURGEON:  [name].   ANESTHESIA:  Local.   INDICATIONS:  This is a 70 year old gentleman who has undergone bilateral  below-the-knee amputation.  He was having some pain in the left calf at his  amputation site and was noted by Dr. Madilyn Fireman to have diminished left femoral  pulse.  He was brought in for diagnostic arteriography to see if he might  potentially be a candidate for iliac angioplasty of the left. Of note his  preprocedural creatinine was 1.7.   TECHNIQUE:  The patient was taken to the PV lab and sedated, sedated with a  milligram of Versed and 50 mcg of fentanyl. I could not palpate a femoral  pulse on the left. He had a diminished femoral pulse in the right. The skin  was anesthetize on the right and the right common femoral artery was  cannulated attempting to cannulate just above his fem-pop bypass graft on  the right. I did not think I can go much higher than that without risking  retroperitoneal of bleeding. I was able to cannulate the common femoral  artery. The guidewire was introduced into the infrarenal aorta. 5-French  sheath was introduced over the wire. The dilator was then removed. Pigtail  catheter was positioned at the L1 vertebral body and flush aortogram  obtained. The catheter was then repositioned of the aortic bifurcation and  an oblique iliac projection was obtained. Bilateral lower extremity runoff  films  were obtained.   FINDINGS:  There are single renal arteries bilaterally but no significant  renal artery stenosis identified. The infrarenal aorta is patent.   On the right side there is an eccentric plaque in the right common iliac  artery which produces an approximately 40% stenosis. Hypogastric artery is  patent and there is some mild disease of the distal external iliac, proximal  common femoral artery on the right above his bypass graft. The superficial  femoral artery is occluded at its origin.  He has diffuse disease of his  deep femoral artery on the right.  His fem-pop bypass graft is patent into a  popliteal artery which has severe diffuse disease. No distal vessels  visualized.   On the left side, the common iliac, external iliac and hypogastric arteries  are widely patent. There is moderate diffuse disease of the common femoral  artery on the left and then severe disease of the deep femoral artery with  an occluded superficial femoral artery. No  named vessels are visualized  distally.   CONCLUSIONS:  1.  Diffuse severe infrainguinal arterial occlusive disease.  2.  Moderate right common iliac artery stenosis and right distal external      and proximal common femoral artery stenosis.      Di Kindle. [name], M.D.  Electronically Signed     CSD/MEDQ  D:  05/13/2005  T:  05/13/2005  Job:  [id number]   cc:   Ernestina Penna, M.D.  Fax: [phone]

## 2010-09-21 NOTE — Discharge Summary (Signed)
NAME:  OVERTON, BOGGUS                        ACCOUNT NO.:  [id number]   MEDICAL RECORD NO.:  [id number]                   PATIENT TYPE:  INP   LOCATION:  2029                                 FACILITY:  [facility]   PHYSICIAN:  Duke Salvia, M.D.               DATE OF BIRTH:  04-15-1941   DATE OF ADMISSION:  08/29/2003  DATE OF DISCHARGE:  09/02/2003                                 DISCHARGE SUMMARY   PROCEDURES:  1. Carotid Dopplers.  2. CT of the chest with contrast.  3. Cranial CT without contrast.  4. MRI/MRA of the neck, head, and brain.   HOSPITAL COURSE:  Mr. Karge is a 70 year old male with a history of  ischemic heart disease who had bypass surgery in 2002.  He has left  ventricular dysfunction with an ejection fraction of 40%, and vascular  disease, as well.  He came to the hospital because of dizziness.  He has had  positional vertigo, mostly at night, but on the day of admission, he was  severely dizzy upon sitting, and unable to stand without assistance.  He sat  in a wheelchair for the next 5 hours, and had to hold on to keep from  feeling like he was going to fall out.  He did not have any arrhythmia, and  his symptoms gradually abated throughout the day.  He was admitted for  further evaluation and treatment.   A neurologic consult was called, and he was seen by Dr. Anne Hahn, who  recommended an MRI of the brain and angiogram, as well as carotid Dopplers.  He would follow with clinical course, although he stated it was probably  initially benign positional vertigo.  A CT scan of the head was performed  first, and this showed right posterior thalamic lacunar stroke, age  indeterminate, and non-hemorrhagic, as well as an old lacunar stroke in the  posterior right basal ganglia.  The MRI and MRA showed atrophy and small  vessel disease, but no acute infarction.  There was no abnormal intracranial  enhancement.  Question RA involvement of C1 to 2.  MRA showed a 75%  stenosis, distal right vertebral artery, and a 75% stenosis or greater in  the distal cervical ICA on the right.  There was moderate cavernous ICA  disease bilaterally, and severe irregularity of the left superior cerebellar  artery.  Possible stenosis versus congenital hypoplasia of the distal basal  artery between the superior cerebellar arteries and posterior cerebral  arteries.  Severe bilateral vertebral disease was seen in the MRA of the  extracranial circulation, which could contribute to the patient's  vertiginous symptoms.  Apparent high-grade stenosis of the distal right  internal carotid artery at the junction of the cervical ICA with the skull  base.  Question dissection versus fibromuscular dysplasia versus  atherosclerotic changes.   Because of the vascular disease, a peripheral vascular consult was called.  He was  seen by interventional radiology, as well as by Dr. Chales Abrahams.  Interventional radiology felt that the right ICA stenosis and vertebral  artery stenosis was amenable to endovascular repair.  Dr. Chales Abrahams saw Mr.  Beagle, as well, and felt that his other medical conditions required more  urgent treatment.  He felt that the pleural effusion, which was detected on  chest x-ray, was the more urgent problem, and, at this point, he is stable.  The treatment of cerebrovascular disease can be delayed until the  respiratory problems are resolved, and there has been a chance to discuss  the case with neurology and vascular surgery, as well.   A left pleural effusion was found on chest x-ray, and a pulmonary consult  was called.  He was seen by Dr .Sung Amabile, who felt that this was of unclear  etiology, and a hemothorax should be ruled out.  Pleural fluid studies were  ordered.  A CT of the chest showed a large complex left pleural effusion  with enhancement of the pleura and thickening of the pleura.  This could  represent an empyema or a malignant effusion, given its appearance.   Abrupt  occlusion of the left lower lobe bronchus was seen with dense atelectasis in  the left lower lobe with milder atelectasis in the left upper lobe.  The  right lung was clear, and there was mild pancreatic atrophy seen.  Thoracentesis was performed on September 01, 2003, and 500 cc of dark maroon  bloody fluid was removed.  There were no complications.  Mr. Tracz had  some pleuritic chest pain after completion of the procedure, but this was  controlled.  Follow up on the pleural fluid indicated any exudative process.  Dr. Sung Amabile felt this was a loculated hemothorax.  The chest x-ray in follow  up showed substantial residual fluid.  CVTS was asked to see to consider  VATS.  Cytology is pending.   Mr. Perkey was seen by Dr. Edwyna Shell, who felt that VATS was indicated, and  this scheduled for Tuesday, Sep 06, 2003.  Interventional radiology followed  up with the patient, and they will see him next week when he comes back for  this procedure, as well.  Mr. Marxen was considered otherwise stable, and  discharged on September 02, 2003, with return visit to the hospital by CVTS for  the VATS procedure on Sep 06, 2003.   LABORATORY VALUES:  Hemoglobin 11.0, hematocrit 33.3, WBC 7.3, platelets  344.  Sodium 137, potassium 4.4, chloride 103, CO2 of 28, BUN 17, creatinine  1.4, glucose 139.  Nasopharyngeal specimen culture reports no growth one  day.  Pleural fluid cytology is pending at the time of dictation.  Total  cholesterol 101, triglycerides 73, HDL 28, LDL 58.   DISCHARGE CONDITION:  Improved.   DISCHARGE DIAGNOSES:  1. Bloody left pleural effusion.  2. Diabetes.  3. Neuropathy.  4. Treated hypothyroidism.  5. Gastroesophageal reflux disease.  6. Hyperlipidemia.  7. Status post cataracts.  8. Status post aortocoronary bypass surgery in February of 2002 with left     internal mammary artery to the left anterior descending, saphenous vein    graft to __________, saphenous vein graft to  RCA, and radial artery to OM-     1.  9. Left ventricular dysfunction with an ejection fraction of 40% at     catheterization in 2002.  10.      Peripheral vascular disease.  11.      Status post bilateral  below knee amputations.  12.      Status post right iliac stent and right femoral-popliteal.  13.      Renal insufficiency.  14.      Hypertension.  15.      History of bilateral carotid bruits with carotid Doppler showing no     significant internal carotid artery stenosis.  16.      History of right carpal tunnel syndrome.   DISCHARGE INSTRUCTIONS:  1. His activity level is to include no strenuous activity.  2. He is to stick to a low fat diabetic diet.  3. He is to come to short stay on Tuesday morning at 6:30 a.m., and he is     not to eat or drink anything after midnight Monday, except Synthroid and     Aciphex with sips of water.  He is not to take Altace on Monday before     the procedure.  On Monday night, he is to decrease his Lantus to 15     units, and he is to hold his Humalog on Tuesday morning.  4. He is to follow up with Dr. Chales Abrahams and Dr. Gerri Spore.   DISCHARGE MEDICATIONS:  1. Synthroid 125 mcg daily.  2. Altace 2.5 mg daily.  3. Aciphex 20 mg daily.  4. Nortriptyline 25 mg q.h.s.  5. Neurontin 300 mg q.h.s.  6. Zocor 40 mg q.p.m.  7. Lantus 32 units q.p.m.  8. Humalog on a home sliding scale a.m. and h.s.  9. Aspirin 81 mg daily.      Theodore Demark, P.A. [facility]                  Duke Salvia, M.D.    RB/MEDQ  D:  09/02/2003  T:  09/03/2003  Job:  [id number]   cc:   Veneda Melter, M.D.   Ines Bloomer, M.D.  [address]  [place]  [place] [address]   Ernestina Penna, M.D.  [address] [place]  [place] [address]  Fax: [phone]

## 2010-09-21 NOTE — Cardiovascular Report (Signed)
[place]  Patient:    Christopher Padilla, Christopher Padilla                     MRN: [id number] Proc. Date: 06/23/00 Adm. Date:  [id number] Attending:  Lenoria Farrier CC:         Monica Becton, M.D.  Daisey Must, M.D. [facility]  Cardiopulmonary Laboratory   Cardiac Catheterization  PROCEDURES PERFORMED:  Cardiac catheterization.  INDICATIONS:  Christopher Padilla is 70 years old and was recently seen in consultation by Dr. Gerri Spore for exertional chest pain over the past three months.  Recently, his pain has become nocturnal, and Dr. Gerri Spore arranged for him to come into the hospital for evaluation with angiography.  He does have insulin-dependent diabetes and has peripheral vascular disease and had a previous femoral-popliteal bypass on the left and has a left lower extremity amputation as well.  DESCRIPTION OF PROCEDURE:  The procedure was performed via the right femoral artery using an arterial sheath and 6 French preformed coronary catheters.  A front wall arterial puncture was performed and Omnipaque contrast was used.  A subclavian injection was performed to access the internal mammary artery for its suitability for bypass grafting.  A distal aortogram was performed to rule out abdominal aortic aneurysm.  The patient tolerated the procedure well and left the laboratory in satisfactory condition.  RESULTS: The left main coronary artery:  The left main coronary artery was free of significant disease.  Left anterior descending: The left anterior descending artery gave rise to three diagonal branches and three septal perforators.  The LAD was diffusely diseased and a small caliber vessel with more focal 80% narrowing in the proximal, mid and distal vessel.  Circumflex artery: The circumflex artery gave rise to a large marginal branch which bifurcated into two sub-branches and a terminal branch which bifurcated into three small posterolateral branches.   The circumflex was also a small caliber vessel that was diffusely diseased with 90% stenosis in the proximal portion of the marginal branch and 70% stenosis in one of the sub-branches with diffuse irregularity and a small lumen caliber throughout.  Right coronary artery: The right coronary is a small to moderate sized vessel that gave rise to the posterior descending and two posterolateral branches.  The vessels also filled partly by collaterals from the left coronary system.  There was a 90% proximal stenosis and there were tandem 70% distal stenoses with small caliber distal vessels.  LEFT VENTRICULOGRAPHY: The left ventriculogram performed in the RAO projection showed hypo - akinesis of the inferobasal wall.  The anterolateral wall out to the apex moved well.  The estimated ejection fraction was 40%.  A subclavian injection showed no subclavian stenosis and patent vertebral and internal mammary arteries.  DISTAL AORTOGRAM: Distal aortogram showed patent renal artery stenosis.  The aorta was irregular.  There was no major aortoiliac obstruction.  The aortic pressure was 171/88 with a mean of 118.  The left ventricular pressure was 171/38.  CONCLUSIONS:  Severe, diffuse three-vessel coronary artery disease with 80% proximal, mid and distal stenosis in the left anterior descending artery, multiple 90 and 70% stenoses in the circumflex marginal vessel, and 90% proximal and 70% distal stenosis in the right coronary artery with inferior wall hypo - akinesis.  RECOMMENDATIONS:  The patient has severe three-vessel disease and left ventricular dysfunction.  Unfortunately, his target vessels are very poor. Nevertheless, I thin surgery probably will be his best option and will plan  surgical consultation.  I discussed these findings with Dr. Gerri Spore. DD:  06/23/00 TD:  06/24/00 Job: [id number]

## 2010-09-21 NOTE — Op Note (Signed)
   NAME:  Christopher Padilla, Christopher Padilla                        ACCOUNT NO.:  [id number]   MEDICAL RECORD NO.:  [id number]                   PATIENT TYPE:  OIB   LOCATION:  5732                                 FACILITY:  [facility]   PHYSICIAN:  [name], M.D.                DATE OF BIRTH:  1940-08-28   DATE OF PROCEDURE:  10/15/2002  DATE OF DISCHARGE:                                 OPERATIVE REPORT   PREOPERATIVE DIAGNOSIS:  Methicillin-resistant Staphylococcus aureus  osteomyelitis, right foot, first ray.   PROCEDURE:  Amputation of right first, second, and third ray, right foot.   SURGEON:  [name], M.D.   ANESTHESIA:  Spinal.   ESTIMATED BLOOD LOSS:  Minimal.   TOURNIQUET TIME:  None.   DISPOSITION:  To PACU in stable condition.   INDICATIONS FOR PROCEDURE:  The patient is a 70 year old gentleman with  severe peripheral vascular disease who is status post partial amputation of  the first ray.  The patient has had progressive ischemia and necrosis of the  first ray, despite wound care and antibiotics.  The patient is not a  revascularization candidate and presents at this time for revision  amputation of the right foot.  The risks and benefits were discussed  including infection, neurovascular injury, nonhealing of the wound, need for  a below-the-knee amputation.  The patient states he understands and wishes  to proceed at this time.   DESCRIPTION OF PROCEDURE:  The patient was brought to OR Room #1 and  underwent a spinal anesthetic.  After adequate level of anesthesia was  obtained, the patient's right lower extremity was prepped using DuraPrep and  draped into a sterile field.  Incision was made around the ischemic area,  and this was carried sharply down to bone.  This required the inclusion of  the first, second, and third rays.  The wound was irrigated with normal  saline.  There was petechial bleeding on the wound edges, but there was  decreased skin turgor.  The  wound was closed using a far-near, near-far  stitch with 2-0 nylon.  There was no tension on the wound edges.  The wound  w covered with Adaptic, orthopedic sponges, sterile Webril, and a loosely-  wrapped Coban.  The patient was then taken to the PACU in stable condition.  We will continue to follow the wound closely.  If there is progressive  further ischemia, have discussed with the patient that he will require a  below-the-knee amputation.                                               [name], M.D.    MVD/MEDQ  D:  10/15/2002  T:  10/16/2002  Job:  [id number]

## 2010-09-21 NOTE — H&P (Signed)
NAME:  Christopher Padilla, Christopher Padilla                        ACCOUNT NO.:  [id number]   MEDICAL RECORD NO.:  [id number]                   PATIENT TYPE:  INP   LOCATION:  5025                                 FACILITY:  [facility]   PHYSICIAN:  Di Kindle. [name], M.D.        DATE OF BIRTH:  1940/12/22   DATE OF ADMISSION:  09/09/2002  DATE OF DISCHARGE:                                HISTORY & PHYSICAL   CHIEF COMPLAINT:  Right great toe ulcer.   HISTORY OF PRESENT ILLNESS:  The patient is a 70 year old white male who is  well known to [facility].  He is status post severe left lower extremity bypass  surgeries and ultimately a left below-knee amputation by Di Kindle.  [name], M.D.  Around two months ago, he developed an ulceration of his  right great toe.  He initially treated this at home with warm water soaks,  however, it did not improve and he was seen by Dr. [name] in the office on  August 25, 2002.  He was started on Cipro and was cautioned that he might  need to undergo arteriography to further delineate his anatomy with an eye  toward revascularization in the future.  The wound continued to worsen.  At  the suggestion of a friend, he went to [facility] where his antibiotic regimen was changed from Cipro  to Augmentin and he was started on dressing changes with some type of gel,  the patient is unsure of the name.  Since that time, his foot has steadily  worsened.  He has significant pain in his right great toe extending up into  his legs at rest.  He has also had a great deal of calf pain with minimal  ambulation and exertion.  His ankle brachial indexes previously done in our  office in December of 2003 where 0.71 on the right.  It was recommended at  the [facility] that he be admitted to the hospital for further  treatment, however, the patient wished to return to [facility]. [facility] and follow up with Dr.  [name].  He was seen today in our office by  [name], M.D., and he agreed that the patient should be admitted and  started on IV antibiotics.  He had not had fever or chills at home, but was  noted to have a low-grade temperature in our office today.   PAST MEDICAL HISTORY:  1. Peripheral vascular disease.  2. Coronary artery disease, status post myocardial infarction.  3. Hyperlipidemia.  4. Gastrointestinal reflux disease.  5. Hypothyroidism.  6. Type 2 insulin-dependent diabetes mellitus.  7. Hypertension.   The patient denies any history of CVA, COPD, or cancer.   PAST SURGICAL HISTORY:  1. Coronary artery bypass graft in February of 2002 by [name], M.D.  2. Multiple peripheral bypass surgeries on the left by [facility]  [name], M.D.  3. Left below-knee amputation by [name], M.D.  4. Right carpal tunnel surgery.  5. Cataract removal.   CURRENT MEDICATIONS:  1. Neurontin 300 mg q.h.s.  2. Nortriptyline 25 mg q.h.s.  3. Synthroid 0.125 mg daily.  4. Altace 2.5 mg daily.  5. Lantus 34 units q.h.s.  6. Aciphex 20 mg daily.  7. Enteric-coated aspirin 81 mg daily.  8. Zocor 40 mg daily.  9. Humulog sliding scale insulin b.i.d. to t.i.d. p.r.n.  10.      Augmentin, dose unknown and the patient does not have the     medication with him at this time.   ALLERGIES:  No known drug allergies.   FAMILY HISTORY:  His father died after a history of coronary artery disease  and CABG.  His mother had a history of cancer and is also deceased.  There  is no other family history of coronary artery disease, hypertension,  diabetes mellitus, strokes, or cancer.   SOCIAL HISTORY:  He is married and [place]  They have one son.  He denies alcohol use.  He previously smoked  one packs of cigarettes per day and smoked for 33-34 years.  He quit in  1999.  He is retired, but does work occasionally at the [employer].   REVIEW OF SYSTEMS:  See history of present illness for pertinent positives  and negatives.  Also, he does experience occasional reflux symptoms,  however, this has been markedly improved since the initiation of Aciphex.  He wears glasses, but notes that his vision has been somewhat blurry  secondary to a cataract.  He does have an occasional dry hacking cough.  He  has a history of diabetes pretty well controlled on Lantus, although he does  occasionally require Humulog sliding scale.  He denies fever, chills, weight  loss, TIA symptoms, dysphagia, weakness, amaurosis fugax, chest pain, heart  palpitations, shortness of breath, dyspnea on exertion, orthopnea, abdominal  pain, nausea, vomiting, diarrhea, constipation, hematochezia, melena,  hematemesis, hematuria, dysuria, nocturia, anxiety, depression, intolerance  to heat or cold, syncope, weakness, or fatigue.   PHYSICAL EXAMINATION:  VITAL SIGNS:  Blood pressure 116/59, heart rate 74  and regular, respirations 20 and unlabored, temperature 100.2 degrees.  GENERAL APPEARANCE:  This is a well-developed, well-nourished, white male in  no acute distress.  HEENT:  Normocephalic and atraumatic.  Pupils equal, round, and reactive to  light and accomodation.  Extraocular movements intact.  Exam of the external  ears and nose reveals no abnormalities.  The oropharynx is clear.  NECK:  Supple without lymphadenopathy, thyromegaly, or carotid bruits.  HEART:  Regular rate and rhythm without murmurs, rubs, or gallops.  CHEST:  He has a well-healed median sternotomy scar.  ABDOMEN:  Soft, nontender, and nondistended with active bowel sounds in all  quadrants.  No masses or hepatosplenomegaly.  LUNGS:  Clear to auscultation.  EXTREMITIES:  He has a well-healed left forearm radial artery harvest scar,  as well as a left thigh saphenectomy scar.  He is status post a left below- knee amputation and ambulates with a prosthesis.  He does have  some mild  right lower extremity edema.  His right great toe has a 3-4 x 4 cm plantar  ulceration which was without drainage.  The dorsum of the foot is red from  his great toe, extending upward into the mid calf region.  This area is warm  to touch, although his foot and great toe are somewhat cool.  His great toe  area is quite tender as well.  There is blistering around the nail bed and  the tip of his great toe is cyanotic.  He has a 2+ palpable left femoral  pulse, a 1+ palpable right femoral pulse, and no palpable distal pulses on  the right.  However, he does have dorsalis pedis and posterior tibial  Doppler signal on the right.  The sensation is decreased in his right foot  and leg, but he has normal range of motion.  NEUROLOGIC:  Cranial nerves II-XII grossly intact.  He is alert and oriented  x 3.   ASSESSMENT AND PLAN:  This is a 70 year old white male with a history of  peripheral vascular disease and status post a left below-knee amputation.  He presents with a two-month history of a nonhealing great toe ulcer on the  right.  He will be admitted at this time for IV antibiotic therapy.  He will  see today by Dr. [name] and will be scheduled for angiography on Friday,  Sep 10, 2002, to further delineate his anatomy.  He may be a candidate for  angioplasty.  However, he may ultimately require surgical revascularization.  He will also be scheduled for operative debridement of his right great toe  and possibly a right great toe amputation.     Coral Ceo, P.A.                        Di Kindle. [name], M.D.    GC/MEDQ  D:  09/09/2002  T:  09/10/2002  Job:  [id number]   cc:   Ernestina Penna, M.D.  [address] [place]  [place] [address]  Fax: [phone]   [facility]

## 2010-09-21 NOTE — H&P (Signed)
NAME:  Christopher Padilla, Christopher Padilla                        ACCOUNT NO.:  [id number]   MEDICAL RECORD NO.:  [id number]                   PATIENT TYPE:  INP   LOCATION:  2312                                 FACILITY:  [facility]   PHYSICIAN:  Ines Bloomer, M.D.              DATE OF BIRTH:  May 15, 1940   DATE OF ADMISSION:  09/06/2003  DATE OF DISCHARGE:                                HISTORY & PHYSICAL   CHIEF COMPLAINT:  Left pleural effusion.   HISTORY OF PRESENT ILLNESS:  The patient is a 70 year old white male who was  recently admitted to  [facility], on August 30, 2003, with a chief  complaint of dizziness.  In the course of his workup, he underwent a chest x-  ray which revealed a large left pleural effusion.  A CT scan was performed  and this confirmed a large left effusion with left lower lobe collapse.  There was evidence of thickening of the pleura.  He underwent a  thoracentesis which yielded about 500 cc of dark bloody fluid.  According to  the patient, he has had some shortness of breath especially with exertion  for some time now.  He has also had a nonproductive cough.  He denies  fevers, chills, weight loss, hemoptysis, orthopnea, paroxysmal nocturnal  dyspnea.  He was seen, during that admission, by Dr. Sung Amabile and cytology on  the fluid yielded from the thoracentesis revealed evidence of inflammation.  Dr. Sung Amabile suspected that this was an empyema and consulted Dr. Dewayne Shorter  for consideration of surgical intervention.  Dr. Edwyna Shell saw the patient and  agreed that the patient would benefit from a VATS with drainage of his  effusion and decortication.  The remainder of his workup revealed no  specific source for his dizziness.  During that admission a neuro  consultation, as well as cardiology consultation, was obtained.  The patient  was able to be discharged home, on September 02, 2003, in stable condition.  He  was rescheduled for outpatient admission, on Sep 06, 2003, for  his VATS.   PAST MEDICAL HISTORY:  1. Peripheral vascular disease status post right iliac artery stent     placement.  2. Coronary artery disease.  3. Dyslipidemia.  4. Hypothyroidism.  5. Type 2 insulin-dependent-diabetes mellitus.  6. Diabetic peripheral neuropathy.  7. Ejection fraction of 40%.  8. Renal insufficiency following his CABG surgery in 2002.  9. Gastroesophageal reflux disease.  10.      CT scan, during his recent admission, revealed evidence of previous     CVA.  11.      MR angiogram, during his recent admission, showed a 70% right     carotid stenosis as well as bilateral vertebral disease.   PAST SURGICAL HISTORY:  1. CABG, in February 2002, by Dr. Donata Clay.  2. Bilateral below the knee amputations.  3. Right femoral-popliteal bypass graft.  4. Cataract removal.  5. Right carpal tunnel surgery.   CURRENT MEDICATIONS:  1. Synthroid 125 mcg every day.  2. Aciphex 2.5 mg every day.  3. Amitriptyline 25 mg q.h.s.  4. Neurontin 300 mg q.h.s.  5. Altace 2.5 mg every day.  6. Zocor 40 mg q.h.s.  7. Lantus 32 units q.h.s.  8. Aspirin 81 mg every day.  9. Humalog sliding scale insulin p.r.n.   ALLERGIES:  No known drug allergies.   SOCIAL HISTORY:  He is married and resides with his wife.  He has one son  and one step-daughter.  He previously smoked 1+ packs of cigarettes per day  and smoked x 30 years.  He quit in 1999.  He denies alcohol use.   FAMILY HISTORY:  His mother died at age 52 of cancer of the lung and brain.  His father died of emphysema.  He has two living brothers who are healthy.  He has one living sister who has diabetes mellitus.   REVIEW OF SYSTEMS:  See history of present illness for pertinent positives  and negatives.  According to the patient, he sustained a fall, back in  November, at which time he fell on his left side.  He received no other  injuries and did not seek treatment.  He has had no further episodes of  falls since then.   He had a prolonged episode of dizziness which brought him  into the hospital, on August 29, 2003, and this had been going on for  approximately ten days prior to that admission, mostly at night and mostly  positional.  He has been relatively stable since his discharge home.   PHYSICAL EXAMINATION:  The patient is seen postoperatively in the ICU,  following his VATS surgery.  VITAL SIGNS:  Blood pressure 124/59, heart rate 80 and regular, respirations  16 and unlabored.  GENERAL:  This is a well-developed, well-nourished, white male in no acute  distress.  He is somewhat groggy from his recent surgery but is alert and  oriented and appropriate on exam.  HEENT:  Normocephalic, atraumatic.  Exam of the external eyes, ears, nose,  and throat reveal no abnormalities.  NECK:  No lymphadenopathy or thyromegaly.  He does have soft bilateral  carotid bruits.  HEART:  Regular rate and rhythm without murmurs, rubs or gallops.  LUNGS:  He has coarse bilateral rhonchi.  He has a left posterior  thoracotomy incision which is dressed and dry.  Chest tube output presently  is mild to moderate.  ABDOMEN:  Soft, nontender, nondistended with active bowel sounds in all  quadrants.  No masses or hepatosplenomegaly.  EXTREMITIES:  He is status post bilateral BKAs.  NEUROLOGIC:  Grossly intact, although somewhat limited by his sedation from  surgery.   ASSESSMENT/PLAN:  This is a 70 year old white male who is status post a left  video-assisted thoracic surgery decortication for what appears to be a  chronic left hemothorax/fibrothorax.  He is currently being admitted to the  surgical intensive care unit and will be undergoing routine postoperative  care.      Coral Ceo, P.A.                        Ines Bloomer, M.D.    GC/MEDQ  D:  09/06/2003  T:  09/06/2003  Job:  [id number]   cc:   Oley Balm. Sung Amabile, M.D. [facility]   Ernestina Penna, M.D. [address] [place]  [place] [address]  Fax: [phone]

## 2010-09-21 NOTE — Consult Note (Signed)
NAME:  Christopher Padilla                        ACCOUNT NO.:  [id number]   MEDICAL RECORD NO.:  [id number]                   Padilla TYPE:  INP   LOCATION:  2029                                 FACILITY:  [facility]   PHYSICIAN:  Christopher Padilla, M.D.                   DATE OF BIRTH:  1941/04/13   DATE OF CONSULTATION:  09/01/2003  DATE OF DISCHARGE:                                   CONSULTATION   CHIEF COMPLAINT:  Dizziness with cerebrovascular and vertebrobasilar  disease.   HISTORY:  Christopher Padilla is a 70 year old white male with diabetes mellitus,  coronary artery disease in Christopher past and peripheral vascular disease who is  admitted to [facility] with dizziness.  Christopher Padilla has had a  history of positional dyspnea over 10 days prior to admission, mostly  occurring at night.  He describes this as being exacerbated by turning his  head to Christopher left or right side.   On Christopher day of admission, he felt profoundly dizzy most of Christopher morning.  Even  while sitting up in his wheelchair, he remained dizzy and felt that he may  fall.  He had no associated nausea or vomiting.  He has not had any chest  pain or shortness of breath recently or with exertion.  During  hospitalization, he has had gradual diminution of his symptoms.  Christopher Padilla  has had an upper respiratory infection in January, and has had some mild  dyspnea on exertion since then.  He denies any fever, chills or  constitutional symptoms.   During hospitalization, he has been assessed by neurology, and a head CT has  been performed suggesting old strokes with lacunar infarcts.  Chest x-ray  showed large, left pleural effusion confirmed by chest CT which also showed  left lower-lobe bronchial collapse.  He underwent thoracentesis today with  removal of 500 cc of bloody effusion.  MRA and MRI of Christopher brain and cerebral  vasculature was also performed showing high-grade narrowing of at least 70  to 80% of Christopher right internal carotid  artery within Christopher petrous portion as  well as bilateral vertebral basilar disease.  This was confirmed by cerebral  angiography performed on August 31, 2003.  I am asked to consult for  consideration of percutaneous stent treatment to Christopher carotid and vertebral  arteries.   PAST MEDICAL HISTORY:  Notable for diabetes mellitus with neuropathy,  hypothyroidism, gastroesophageal reflux disease, dyslipidemia, coronary  artery disease, status post coronary artery bypass graft surgery in February  of 2002.  He had mild LV dysfunction at that time.  An echocardiogram now  during hospitalization shows normal LV function.  He is status post  bilateral below-Christopher-knee amputation was advanced peripheral vascular disease  and peripheral gangrene.  He has had a right iliac stent and right femoral  popliteal bypass in Christopher past as well as mild renal insufficiency.  He  has  had cataract surgery.   ALLERGIES:  None.   MEDICATIONS:  1. At home include Synthroid 0.125 mg per day.  2. Altace 2.5 mg daily.  3. Aciphex 20 mg a day.  4. Nortriptyline 25 mg a day.  5. Neurontin 300 mg q.h.s.  6. Zocor 40 mg q.h.s.  7. Lantus insulin, 32 units q.h.s.  8. Baby aspirin daily.  9. Humalog as needed.   SOCIAL HISTORY:  Christopher Padilla is married and lives in [place].  He has two  children.  He denies alcohol use.  He has a history of tobacco use greater  than one pack per day x30 years. He quite in 1999.   FAMILY HISTORY:  Daughter has a history of asthma.  Mother died at Christopher age  of 42 with lung and brain cancer.  Father died with emphysema.  Christopher Padilla  has two brothers and one sister.  One sister has diabetes.   PHYSICAL EXAMINATION:  GENERAL:  He is a well-developed, well-nourished  white male, in no acute distress.  VITAL SIGNS:  Temperature 98.0, blood pressure 160/96, pulse 94 and regular.  Respirations 20.  O2 saturations 96%.  HEENT:  Pupils are equal, round and reactive to light.  Extraocular  muscles  are intact. Oropharynx shows no lesions.  NECK:  Supple, soft bruits bilaterally.  HEART:  Exam reveals a regular rate without murmurs.  LUNGS:  Diminished breath sounds on Christopher left.  ABDOMEN:  Nontender.  EXTREMITIES:  Status post bilateral BKA's.  Femoral pulses are bounding.   Chest x-ray and chest CT show large left pleural effusion with compromised  left lower-lobe bronchus.  MRA and MRI as noted showed advanced right ICA  bilateral vertebral stenosis with evidence of old lacunar infarction.  ECG  on August 29, 2003, shows normal sinus rhythm at 84, with poor R wave  progression, and nonspecific T wave changes in Christopher lateral leads.   LABORATORY DATA:  Notable for a BUN of 17, creatinine 1.2, sodium 4.4.  Hemoglobin 11.3, hematocrit 35.1, INR 1.0, all this on August 29, 2003.  Lipid profile on April 26 showed total cholesterol of 101, triglycerides  73, HDL 28, LDL 58.   ASSESSMENT/PLAN:  Christopher Padilla is 69 year old white male who presents with a  10-day history of positional dizziness that has progressively increased  occurring for several hours at a time regardless of position.  Christopher Padilla  does not have associated symptoms to suggest a central cause, although Christopher  positional nature would suggest labyrinthitis.  Other considerations include  vertebral basilar insufficiency, cupulolithiasis, or vestibulopathy.  Christopher  Padilla may indeed be compromising posterior circulation with position due  to his vertebrobasilar disease.  Christopher Padilla's severe right internal carotid  artery narrowing does increase his risk for stroke.  At this point, it  appears to be asymptomatic in Christopher high location, carotid endarterectomy  would not be feasible, and percutaneous stent placement would be Christopher  treatment of choice.  I will discuss Christopher case further with my neurology and vascular surgery colleagues to determine whether this would be indicated to  reduce his risk for future strokes.  This may  not, however, treat his  current symptomatology.  I discussed with Christopher Padilla's wife Christopher risks,  benefits and alternatives of medical versus percutaneous treatment of  vertebrobasilar insufficiency.  I will discuss this further with neurology  and vascular  surgery, and Christopher Padilla to recheck consensus as to how best to proceed.  At  this point, however, treatment of his pulmonary process should take  precedence.  Thank you for asking me to see Christopher Padilla.  I will be happy to  follow with you.                                               Christopher Padilla, M.D.    Melton Alar  D:  09/01/2003  T:  09/02/2003  Job:  [id number]   cc:   Carole Binning, M.D. [facility]   Demetrius Charity Liliane Bade, M.D.  [address]  [place]  [place] [address]   Ernestina Penna, M.D.  [address] [place]  [place] [address]  Fax: [phone]

## 2010-09-21 NOTE — Discharge Summary (Signed)
NAME:  Christopher Padilla, Christopher Padilla                        ACCOUNT NO.:  [id number]   MEDICAL RECORD NO.:  [id number]                   PATIENT TYPE:  INP   LOCATION:  5732                                 FACILITY:  [facility]   PHYSICIAN:  Di Kindle. [name], M.D.        DATE OF BIRTH:  Apr 06, 1941   DATE OF ADMISSION:  10/12/2002  DATE OF DISCHARGE:  10/23/2002                                 DISCHARGE SUMMARY   BRIEF HISTORY:  The patient is a 70 year old white male who was seen by CVTS  with a nonhealing toe ulcer and cellulitis of the right lower extremity.  The patient was evaluated and underwent arteriogram, and then PTCA of the  right common iliac stent placement followed by a right femoral to above-knee  popliteal bypass graft with 60 mm Gore-Tex on Sep 10, 2002.  The patient did  well, showed improvement, and was ultimately discharged home on Sep 17, 2002.  Since discharge, he has seen a progression of the infection of the  foot.  He has been on antibiotics.  His graft has remained open.  He was  seen by Dr. [name] on the day of admission with foot infection that had  become progressively worse, and he was admitted emergently at that time for  right great toe amputation, IV antibiotics, and further treatment as  indicated.  The patient is already status post a right BKA by Dr. Lajoyce Corners.  Dr.  Lajoyce Corners was also consulted to see the patient during this hospitalization.   PAST MEDICAL HISTORY:  1. Coronary artery disease.  2 . History of __________ in the past.  1. Type 2 diabetes, insulin dependent.  2. Peripheral vascular disease.  3. Hypothyroidism.  4. Hypertension.   ALLERGIES:  None known.   MEDICATIONS ON ADMISSION:  1. Neurontin 300 mg q.h.s.  2. Synthroid 125 mcg daily.  3. Altace 2.5 mg daily.  4. Aciphex 20 mg daily.  5. Aspirin 81 mg daily.  6. __________  10 mg daily.  7. Humalog sliding scale.  8. Lantus 34 units q.a.m.  9. Nortriptyline 25 mg daily.   HOSPITAL COURSE:   The patient was admitted and taken to the operating room  by Dr. [name] who did an open ray amputation of the right great toe.  He  tolerated this well.  The following day, the wound was inspected, and it  looked fairly clean.  The patient was maintained on IV Zosyn, whirlpools,  and daily dressing changes.  Dr. Lajoyce Corners was contacted, and he recommended  hydrotherapy, and the patient was noted to have progressive ischemic changes  around the first ray amputation.  He was subsequently taken back to the  operating room by Dr. Lajoyce Corners on 10/15/02 and underwent amputation of the right  first, second, and third ray of the right foot.  The patient tolerated this  well.  It was closed, and the patient has made slow, steady progress.  The  patient threw out MRSA from the wound and was changed from Zosyn to IV  vancomycin.  The patient has made good improvement with wound care.  The  site continues to look good.  It was Dr. [name] opinion that after 10  days of vancomycin the patient could be discharged home on October 23, 2002.  He will be discharged home on his preadmission medicines listed above along  with Tylox 1-2 p.o. q.4h. p.r.n. for pain and Cipro 500 mg p.o. b.i.d.  He  may use either Tylox or Tylenol for the pain.  He is to keep his foot  elevated when not walking, maintain a diabetic diet.  He is to clean the  site with soap and water, dry 4 x 4's, Kerlix, and ace.  A home health nurse  has been arranged to help with dressing care, and physical therapy wants to  continue the hydrotherapy.  The patient is to call for any redness, increase  in drainage or fever.  He will follow up with Dr. Lajoyce Corners in one week, and then  see Dr. [name] in two weeks.  The graft continues to be taped at the time  of discharge.     Eber Hong, P.A.                 Di Kindle. [name], M.D.    WDJ/MEDQ  D:  10/22/2002  T:  10/25/2002  Job:  [id number]   cc:   Ernestina Penna, M.D.  [address]  [place]  [place] [address]  Fax: [phone]   [name], M.D.  [address]  [place]  [place] [address]  Fax: [phone]    cc:   Ernestina Penna, M.D.  [address] [place]  [place] [address]  Fax: [phone]   [name], M.D.  [address]  [place]  [place] [address]  Fax: [phone]

## 2010-12-12 ENCOUNTER — Encounter: Payer: Self-pay | Admitting: Cardiology

## 2011-02-19 ENCOUNTER — Encounter: Payer: Self-pay | Admitting: Cardiology

## 2011-02-21 ENCOUNTER — Encounter: Payer: Self-pay | Admitting: Cardiology

## 2011-02-27 ENCOUNTER — Ambulatory Visit (INDEPENDENT_AMBULATORY_CARE_PROVIDER_SITE_OTHER): Payer: BC Managed Care – PPO | Admitting: Cardiology

## 2011-02-27 ENCOUNTER — Encounter: Payer: Self-pay | Admitting: Cardiology

## 2011-02-27 DIAGNOSIS — I5040 Unspecified combined systolic (congestive) and diastolic (congestive) heart failure: Secondary | ICD-10-CM

## 2011-02-27 DIAGNOSIS — I679 Cerebrovascular disease, unspecified: Secondary | ICD-10-CM

## 2011-02-27 DIAGNOSIS — I2510 Atherosclerotic heart disease of native coronary artery without angina pectoris: Secondary | ICD-10-CM

## 2011-02-27 DIAGNOSIS — E785 Hyperlipidemia, unspecified: Secondary | ICD-10-CM

## 2011-02-27 NOTE — Patient Instructions (Addendum)
Your physician has requested that you have a carotid duplex. This test is an ultrasound of the carotid arteries in your neck. It looks at blood flow through these arteries that supply the brain with blood. Allow one hour for this exam. There are no restrictions or special instructions.  Your physician has requested that you have an echocardiogram. Echocardiography is a painless test that uses sound waves to create images of your heart. It provides your doctor with information about the size and shape of your heart and how well your heart's chambers and valves are working. This procedure takes approximately one hour. There are no restrictions for this procedure.  Your physician has requested that you have a lexiscan myoview. For further information please visit [email or web address] Please follow instruction sheet, as given.  The current medical regimen is effective;  continue present plan and medications.  Follow up after testing.

## 2011-02-27 NOTE — Assessment & Plan Note (Signed)
His EF was 45% in 2010.  I will check an echo to look at this and evaluate his systolic murmurs.

## 2011-02-27 NOTE — Assessment & Plan Note (Signed)
His LDL was 59 with an HDL of 42. He will remain on meds as listed.

## 2011-02-27 NOTE — Progress Notes (Signed)
HPI The patient present for 18 month follow up.  Since I last saw him he has done well. The patient denies any new symptoms such as chest discomfort, neck or arm discomfort. There has been no new shortness of breath, PND or orthopnea. There have been no reported palpitations, presyncope or syncope.  He is most limited by pain in his right lower extremity prosthesis. He has to stop frequently because of this discomfort which somewhat limits his activity. However, he is not getting any of the symptoms that he had prior to his bypass.  Allergies  Allergen Reactions  . Codeine     REACTION: Reaction not known    Current Outpatient Prescriptions  Medication Sig Dispense Refill  . aspirin 81 MG tablet Take 81 mg by mouth daily.        Marland Kitchen ezetimibe-simvastatin (VYTORIN) 10-40 MG per tablet Take 1 tablet by mouth daily.        Marland Kitchen GABAPENTIN PO Take by mouth daily.        . insulin glargine (LANTUS) 100 UNIT/ML injection Inject into the skin as directed.        . insulin lispro (HUMALOG) 100 UNIT/ML injection Inject into the skin as directed.        Marland Kitchen levothyroxine (SYNTHROID, LEVOTHROID) 125 MCG tablet Take 125 mcg by mouth daily.        Marland Kitchen NORTRIPTYLINE HCL PO Take by mouth at bedtime.        Marland Kitchen omeprazole (PRILOSEC) 20 MG capsule Take 20 mg by mouth daily.        . ramipril (ALTACE) 2.5 MG capsule Take 2.5 mg by mouth daily.          Past Medical History  Diagnosis Date  . CAD (coronary artery disease)     S/P CABG, Feb 2003, LIMA to the LAD, left free radial artery to an obtuse marginal, spahenous vein graft to diagonal, saphenous vein graft to RCA with endarterectomy  . S/P below knee amputation     Left  . Cerebrovascular disease     MRA in 2005 with 75% stenosis in distal right vertebral artery and 75% stenosis greater in distal cervical internal carotid artery on teh right, severe bilateral disease and MRA of the extracranial circulation, high grade stenosis of the distal right internal  carotid artery at the junction of the cervical internal carotid artery of the skull base  . Diabetes mellitus   . Hyperlipidemia   . Hypothyroidism   . History of tobacco use     Quit in 1990    Past Surgical History  Procedure Date  . Leg amputation below knee     Bilateral  . Arterial bypass surgry     Left; left femoral to posterior tibial bypass gracting, left femoral artery and deep femoral artery endarterectomy with vein patch angioplasty of the common femoral arter and deep femoral artery 06/2005, right fremoral popliteal bypass, right iliac artery restent, bilateral below knee amputations  . Thoracotomy     With drainage of a hemathroax and decortication of fibrothorax  . Coronary artery bypass graft 2002    By [name], MD  . Cataract extraction     Bilateral  . Carpal tunnel release     Right    ROS:  As stated in the HPI and negative for all other systems.  PHYSICAL EXAM BP 108/68  Pulse 82  Resp 18  Ht 5\' 8"  (1.727 m)  Wt 169 lb (76.658 kg)  BMI  25.70 kg/m2 GENERAL:  Well appearing HEENT:  Pupils equal round and reactive, fundi not visualized, oral mucosa unremarkable NECK:  No jugular venous distention, waveform within normal limits, carotid upstroke brisk and symmetric, right bruits, no thyromegaly LYMPHATICS:  No cervical, inguinal adenopathy LUNGS:  Clear to auscultation bilaterally BACK:  No CVA tenderness CHEST:  Well healed sternotomy scar. HEART:  PMI not displaced or sustained,S1 and S2 within normal limits, no S3, no S4, no clicks, no rubs, apical murmur radiating out the outflow tract and slightly to the axilla. ABD:  Flat, positive bowel sounds normal in frequency in pitch, no bruits, no rebound, no guarding, no midline pulsatile mass, no hepatomegaly, no splenomegaly EXT:  Absent radials and bilateral BKAs, no edema, no cyanosis no clubbing, femoral bruits SKIN:  No rashes no nodules NEURO:  Cranial nerves II through XII grossly intact, motor  grossly intact throughout [facility]:  Cognitively intact, oriented to person place and time   EKG:  Normal sinus rhythm, rate 83, old inferior infarct, poor anterior R-wave progression, lateral T wave is pronounced than previous 03-04-2011  ASSESSMENT AND PLAN

## 2011-02-27 NOTE — Assessment & Plan Note (Signed)
He has had carotid and vertebral stenosis.  He will need Dopplers.

## 2011-02-27 NOTE — Assessment & Plan Note (Signed)
He has a somewhat limited functional status and 70 year old bypass grafts. He needs a Scientist, physiological.

## 2011-03-04 ENCOUNTER — Ambulatory Visit: Payer: BC Managed Care – PPO | Attending: Family Medicine | Admitting: Physical Therapy

## 2011-03-04 DIAGNOSIS — IMO0001 Reserved for inherently not codable concepts without codable children: Secondary | ICD-10-CM | POA: Insufficient documentation

## 2011-03-04 DIAGNOSIS — R5381 Other malaise: Secondary | ICD-10-CM | POA: Insufficient documentation

## 2011-03-04 DIAGNOSIS — M25519 Pain in unspecified shoulder: Secondary | ICD-10-CM | POA: Insufficient documentation

## 2011-03-04 DIAGNOSIS — M25619 Stiffness of unspecified shoulder, not elsewhere classified: Secondary | ICD-10-CM | POA: Insufficient documentation

## 2011-03-08 ENCOUNTER — Ambulatory Visit: Payer: BC Managed Care – PPO | Attending: Family Medicine | Admitting: *Deleted

## 2011-03-08 DIAGNOSIS — M25619 Stiffness of unspecified shoulder, not elsewhere classified: Secondary | ICD-10-CM | POA: Insufficient documentation

## 2011-03-08 DIAGNOSIS — IMO0001 Reserved for inherently not codable concepts without codable children: Secondary | ICD-10-CM | POA: Insufficient documentation

## 2011-03-08 DIAGNOSIS — M25519 Pain in unspecified shoulder: Secondary | ICD-10-CM | POA: Insufficient documentation

## 2011-03-08 DIAGNOSIS — R5381 Other malaise: Secondary | ICD-10-CM | POA: Insufficient documentation

## 2011-03-11 ENCOUNTER — Ambulatory Visit: Payer: BC Managed Care – PPO | Admitting: Physical Therapy

## 2011-03-15 ENCOUNTER — Ambulatory Visit: Payer: BC Managed Care – PPO | Admitting: *Deleted

## 2011-03-18 ENCOUNTER — Ambulatory Visit: Payer: BC Managed Care – PPO | Admitting: Physical Therapy

## 2011-03-21 ENCOUNTER — Ambulatory Visit: Payer: BC Managed Care – PPO | Admitting: Physical Therapy

## 2011-03-25 ENCOUNTER — Other Ambulatory Visit (HOSPITAL_COMMUNITY): Payer: Self-pay | Admitting: Radiology

## 2011-03-25 ENCOUNTER — Encounter: Payer: Self-pay | Admitting: Cardiology

## 2011-03-26 ENCOUNTER — Ambulatory Visit: Payer: BC Managed Care – PPO | Admitting: Physical Therapy

## 2011-04-01 ENCOUNTER — Ambulatory Visit: Payer: BC Managed Care – PPO | Admitting: Physical Therapy

## 2011-04-04 ENCOUNTER — Ambulatory Visit: Payer: BC Managed Care – PPO | Admitting: Physical Therapy

## 2011-04-08 ENCOUNTER — Ambulatory Visit (HOSPITAL_COMMUNITY): Payer: BC Managed Care – PPO | Attending: Cardiology | Admitting: Radiology

## 2011-04-08 ENCOUNTER — Encounter (INDEPENDENT_AMBULATORY_CARE_PROVIDER_SITE_OTHER): Payer: BC Managed Care – PPO | Admitting: *Deleted

## 2011-04-08 VITALS — Ht 68.0 in | Wt 162.0 lb

## 2011-04-08 DIAGNOSIS — I6529 Occlusion and stenosis of unspecified carotid artery: Secondary | ICD-10-CM

## 2011-04-08 DIAGNOSIS — Z951 Presence of aortocoronary bypass graft: Secondary | ICD-10-CM | POA: Insufficient documentation

## 2011-04-08 DIAGNOSIS — Z87891 Personal history of nicotine dependence: Secondary | ICD-10-CM | POA: Insufficient documentation

## 2011-04-08 DIAGNOSIS — R011 Cardiac murmur, unspecified: Secondary | ICD-10-CM | POA: Insufficient documentation

## 2011-04-08 DIAGNOSIS — I739 Peripheral vascular disease, unspecified: Secondary | ICD-10-CM | POA: Insufficient documentation

## 2011-04-08 DIAGNOSIS — I359 Nonrheumatic aortic valve disorder, unspecified: Secondary | ICD-10-CM | POA: Insufficient documentation

## 2011-04-08 DIAGNOSIS — I2510 Atherosclerotic heart disease of native coronary artery without angina pectoris: Secondary | ICD-10-CM | POA: Insufficient documentation

## 2011-04-08 DIAGNOSIS — I059 Rheumatic mitral valve disease, unspecified: Secondary | ICD-10-CM | POA: Insufficient documentation

## 2011-04-08 DIAGNOSIS — I4891 Unspecified atrial fibrillation: Secondary | ICD-10-CM | POA: Insufficient documentation

## 2011-04-08 DIAGNOSIS — E119 Type 2 diabetes mellitus without complications: Secondary | ICD-10-CM | POA: Insufficient documentation

## 2011-04-08 DIAGNOSIS — Z8673 Personal history of transient ischemic attack (TIA), and cerebral infarction without residual deficits: Secondary | ICD-10-CM | POA: Insufficient documentation

## 2011-04-08 DIAGNOSIS — Z794 Long term (current) use of insulin: Secondary | ICD-10-CM | POA: Insufficient documentation

## 2011-04-08 DIAGNOSIS — I509 Heart failure, unspecified: Secondary | ICD-10-CM | POA: Insufficient documentation

## 2011-04-08 DIAGNOSIS — E785 Hyperlipidemia, unspecified: Secondary | ICD-10-CM | POA: Insufficient documentation

## 2011-04-08 DIAGNOSIS — Z8249 Family history of ischemic heart disease and other diseases of the circulatory system: Secondary | ICD-10-CM | POA: Insufficient documentation

## 2011-04-08 DIAGNOSIS — I5040 Unspecified combined systolic (congestive) and diastolic (congestive) heart failure: Secondary | ICD-10-CM

## 2011-04-08 DIAGNOSIS — I079 Rheumatic tricuspid valve disease, unspecified: Secondary | ICD-10-CM | POA: Insufficient documentation

## 2011-04-08 DIAGNOSIS — I679 Cerebrovascular disease, unspecified: Secondary | ICD-10-CM

## 2011-04-08 DIAGNOSIS — R002 Palpitations: Secondary | ICD-10-CM | POA: Insufficient documentation

## 2011-04-08 MED ORDER — REGADENOSON 0.4 MG/5ML IV SOLN
0.4000 mg | Freq: Once | INTRAVENOUS | Status: AC
  Administered 2011-04-08: 0.4 mg via INTRAVENOUS

## 2011-04-08 MED ORDER — TECHNETIUM TC 99M TETROFOSMIN IV KIT
33.0000 | PACK | Freq: Once | INTRAVENOUS | Status: AC | PRN
  Administered 2011-04-08: 33 via INTRAVENOUS

## 2011-04-08 MED ORDER — TECHNETIUM TC 99M TETROFOSMIN IV KIT
11.0000 | PACK | Freq: Once | INTRAVENOUS | Status: AC | PRN
  Administered 2011-04-08: 11 via INTRAVENOUS

## 2011-04-08 NOTE — Progress Notes (Signed)
[facility] SITE 3 NUCLEAR MED [address] [place] [address] [phone]  Cardiology Nuclear Med Study  Christopher Padilla is a 70 y.o. male [id number] 07-Sep-1940   Nuclear Med Background Indication for Stress Test:  Evaluation for Ischemia and Graft Patency History:  &#39;02 CABG, with post op afib; &#39;10 HQI:ONGEXBMW infero-apical scar, no ischemia, EF=40%; '10 Echo:EF=45% Cardiac Risk Factors: Carotid Disease, Family History - CAD, History of Smoking, IDDM Type 2, Lipids, PVD and TIA  Symptoms:  Palpitations   Nuclear Pre-Procedure Caffeine/Decaff Intake:  None NPO After: 9:00pm   Lungs:  Clear.  O2 SAT 98% on RA IV 0.9% NS with Angio Cath:  22g  IV Site: R Forearm  IV Started by:  Stanton Kidney, EMT-P  Chest Size (in):  40 Cup Size: n/a  Height: 5\' 8"  (1.727 m)  Weight:  162 lb (73.483 kg)  BMI:  Body mass index is 24.63 kg/(m^2). Tech Comments:  CBG= 150 @ 6:15 am, per patient.    Nuclear Med Study 1 or 2 day study: 1 day  Stress Test Type:  Eugenie Birks  Reading MD: Olga Millers, MD  Order Authorizing Provider:  Rollene Rotunda, MD  Resting Radionuclide: Technetium 6m Tetrofosmin  Resting Radionuclide Dose: 11.0 mCi   Stress Radionuclide:  Technetium 45m Tetrofosmin  Stress Radionuclide Dose: 33.0 mCi           Stress Protocol Rest HR: 70 Stress HR: 79  Rest BP: 141/78 Stress BP: 140/76  Exercise Time (min): n/a METS: n/a   Predicted Max HR: 148 bpm % Max HR: 53.38 bpm Rate Pressure Product: 41324   Dose of Adenosine (mg):  n/a Dose of Lexiscan: 0.4 mg  Dose of Atropine (mg): n/a Dose of Dobutamine: n/a mcg/kg/min (at max HR)  Stress Test Technologist: Smiley Houseman, CMA-N  Nuclear Technologist:  Domenic Polite, CNMT     Rest Procedure:  Myocardial perfusion imaging was performed at rest 45 minutes following the intravenous administration of Technetium 39m Tetrofosmin.  Rest ECG: Marked ST-T wave changes.  Stress Procedure:  The  patient received IV Lexiscan 0.4 mg over 15-seconds.  Technetium 33m Tetrofosmin injected at 30-seconds.  There were no significant changes with Lexiscan.  Quantitative spect images were obtained after a 45 minute delay.  Stress ECG: No significant ST segment change suggestive of ischemia.  QPS Raw Data Images:  Acquisition technically good; mild LVE. Stress Images:  There is decreased uptake in the inferior wall and apex. Rest Images:  There is decreased uptake in the inferior wall and apex, apical defect slightly less prominent compared to stress images. Subtraction (SDS):  These findings are consistent with prior inferior and apical infarct with very mild apical ischemia. Transient Ischemic Dilatation (Normal <1.22):  1.10 Lung/Heart Ratio (Normal <0.45):  0.38  Quantitative Gated Spect Images QGS EDV:  112 ml QGS ESV:  58 ml QGS cine images:  Apical hypokinesis QGS EF: 48%  Impression Exercise Capacity:  Lexiscan with no exercise. BP Response:  Normal blood pressure response. Clinical Symptoms:  No chest pain. ECG Impression:  No significant ST segment change suggestive of ischemia. 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     Olga Millers

## 2011-04-09 ENCOUNTER — Ambulatory Visit: Payer: BC Managed Care – PPO | Attending: Family Medicine | Admitting: Physical Therapy

## 2011-04-09 DIAGNOSIS — M25519 Pain in unspecified shoulder: Secondary | ICD-10-CM | POA: Insufficient documentation

## 2011-04-09 DIAGNOSIS — R5381 Other malaise: Secondary | ICD-10-CM | POA: Insufficient documentation

## 2011-04-09 DIAGNOSIS — M25619 Stiffness of unspecified shoulder, not elsewhere classified: Secondary | ICD-10-CM | POA: Insufficient documentation

## 2011-04-09 DIAGNOSIS — IMO0001 Reserved for inherently not codable concepts without codable children: Secondary | ICD-10-CM | POA: Insufficient documentation

## 2011-04-12 ENCOUNTER — Telehealth: Payer: Self-pay | Admitting: Cardiology

## 2011-04-12 ENCOUNTER — Ambulatory Visit: Payer: BC Managed Care – PPO | Admitting: *Deleted

## 2011-04-12 ENCOUNTER — Telehealth: Payer: Self-pay | Admitting: Internal Medicine

## 2011-04-12 NOTE — Telephone Encounter (Signed)
LMOMTCB x 1 

## 2011-04-12 NOTE — Telephone Encounter (Signed)
Fu call °Returning your call °

## 2011-04-15 ENCOUNTER — Ambulatory Visit: Payer: BC Managed Care – PPO | Admitting: Physical Therapy

## 2011-04-15 NOTE — Telephone Encounter (Signed)
LMTCB x 2- ? If this is our pt since records do not indicate  We have seen him?

## 2011-04-16 NOTE — Telephone Encounter (Signed)
LMOMTCB x 1 

## 2011-04-17 NOTE — Telephone Encounter (Signed)
Still unable to speak with the patient. Left another msg and asked that the pt callback to leave a new msg if still needing to speak with a nurse. This msg will be closed.

## 2011-04-18 ENCOUNTER — Ambulatory Visit: Payer: BC Managed Care – PPO | Admitting: Physical Therapy

## 2011-06-06 ENCOUNTER — Ambulatory Visit: Payer: BC Managed Care – PPO | Admitting: Cardiology

## 2011-07-03 ENCOUNTER — Encounter: Payer: Self-pay | Admitting: Cardiology

## 2011-07-03 ENCOUNTER — Ambulatory Visit (INDEPENDENT_AMBULATORY_CARE_PROVIDER_SITE_OTHER): Payer: BC Managed Care – PPO | Admitting: Cardiology

## 2011-07-03 VITALS — BP 131/66 | HR 80 | Ht 68.0 in | Wt 165.0 lb

## 2011-07-03 DIAGNOSIS — I340 Nonrheumatic mitral (valve) insufficiency: Secondary | ICD-10-CM

## 2011-07-03 DIAGNOSIS — I059 Rheumatic mitral valve disease, unspecified: Secondary | ICD-10-CM

## 2011-07-03 DIAGNOSIS — I679 Cerebrovascular disease, unspecified: Secondary | ICD-10-CM

## 2011-07-03 DIAGNOSIS — E119 Type 2 diabetes mellitus without complications: Secondary | ICD-10-CM

## 2011-07-03 DIAGNOSIS — E785 Hyperlipidemia, unspecified: Secondary | ICD-10-CM

## 2011-07-03 DIAGNOSIS — I2510 Atherosclerotic heart disease of native coronary artery without angina pectoris: Secondary | ICD-10-CM

## 2011-07-03 NOTE — Patient Instructions (Signed)
The current medical regimen is effective;  continue present plan and medications.  Your physician has requested that you have an echocardiogram. Echocardiography is a painless test that uses sound waves to create images of your heart. It provides your doctor with information about the size and shape of your heart and how well your heart's chambers and valves are working. This procedure takes approximately one hour. There are no restrictions for this procedure.  Follow up with Dr Antoine Poche in December after your 2 D Echo.

## 2011-07-03 NOTE — Assessment & Plan Note (Signed)
His LDL is 59 with an HDL of 42. These are at target and he will continue the meds as listed.

## 2011-07-03 NOTE — Assessment & Plan Note (Signed)
His last hemoglobi n A1c was 8.1.  However, he's following closely with his primary providers.

## 2011-07-03 NOTE — Assessment & Plan Note (Signed)
He has had moderate mitral regurgitation. I will plan a repeat echocardiogram in December to follow this and I will see him after that.

## 2011-07-03 NOTE — Progress Notes (Signed)
HPI The patient present for followup of his known coronary disease. In December I sent him for screening stress perfusion imaging because his bypass graft are 73 years old. He had an old mild inferior infarct but a well preserved ejection fraction and no evidence of ischemia. To followup on moderate mitral regurgitation he had a repeat echo which again confirmed some moderate mitral regurgitation. He presented today to review these data. Since that time he has had no new cardiovascular complaints. He gets around fairly well on his prosthesis. With his current level of activity he denies any chest pressure, neck or arm discomfort. He has not had any palpitations, presyncope or syncope. He denies any shortness of breath, PND or orthopnea.  Allergies  Allergen Reactions  . Codeine     REACTION: Reaction not known    Current Outpatient Prescriptions  Medication Sig Dispense Refill  . aspirin 81 MG tablet Take 81 mg by mouth daily.        Marland Kitchen ezetimibe-simvastatin (VYTORIN) 10-40 MG per tablet Take 1 tablet by mouth daily.        Marland Kitchen GABAPENTIN PO Take by mouth daily.        . insulin glargine (LANTUS) 100 UNIT/ML injection Inject into the skin as directed.        . insulin lispro (HUMALOG) 100 UNIT/ML injection Inject into the skin as directed.        Marland Kitchen levothyroxine (SYNTHROID, LEVOTHROID) 125 MCG tablet Take 125 mcg by mouth daily.        Marland Kitchen NORTRIPTYLINE HCL PO Take by mouth at bedtime.        Marland Kitchen omeprazole (PRILOSEC) 20 MG capsule Take 20 mg by mouth daily.        . ramipril (ALTACE) 2.5 MG capsule Take 2.5 mg by mouth daily.          Past Medical History  Diagnosis Date  . CAD (coronary artery disease)     S/P CABG, Feb 2003, LIMA to the LAD, left free radial artery to an obtuse marginal, spahenous vein graft to diagonal, saphenous vein graft to RCA with endarterectomy  . S/P below knee amputation     Left  . Cerebrovascular disease     MRA in 2005 with 75% stenosis in distal right  vertebral artery and 75% stenosis greater in distal cervical internal carotid artery on teh right, severe bilateral disease and MRA of the extracranial circulation, high grade stenosis of the distal right internal carotid artery at the junction of the cervical internal carotid artery of the skull base  . Diabetes mellitus   . Hyperlipidemia   . Hypothyroidism   . History of tobacco use     Quit in 1990    Past Surgical History  Procedure Date  . Leg amputation below knee     Bilateral  . Arterial bypass surgry     Left; left femoral to posterior tibial bypass gracting, left femoral artery and deep femoral artery endarterectomy with vein patch angioplasty of the common femoral arter and deep femoral artery 06/2005, right fremoral popliteal bypass, right iliac artery restent, bilateral below knee amputations  . Thoracotomy     With drainage of a hemathroax and decortication of fibrothorax  . Coronary artery bypass graft 2002    By [name], MD  . Cataract extraction     Bilateral  . Carpal tunnel release     Right    ROS:  As stated in the HPI and negative for  all other systems.  PHYSICAL EXAM BP 131/66  Pulse 80  Ht 5\' 8"  (1.727 m)  Wt 165 lb (74.844 kg)  BMI 25.09 kg/m2 GENERAL:  Well appearing HEENT:  Pupils equal round and reactive, fundi not visualized, oral mucosa unremarkable NECK:  No jugular venous distention, waveform within normal limits, carotid upstroke brisk and symmetric, right bruits, no thyromegaly LYMPHATICS:  No cervical, inguinal adenopathy LUNGS:  Clear to auscultation bilaterally BACK:  No CVA tenderness CHEST:  Well healed sternotomy scar. HEART:  PMI not displaced or sustained,S1 and S2 within normal limits, no S3, no S4, no clicks, no rubs, apical murmur radiating out the outflow tract and slightly to the axilla. ABD:  Flat, positive bowel sounds normal in frequency in pitch, no bruits, no rebound, no guarding, no midline pulsatile mass, no  hepatomegaly, no splenomegaly EXT:  Absent radials and bilateral BKAs, no edema, no cyanosis no clubbing, femoral bruits SKIN:  No rashes no nodules NEURO:  Cranial nerves II through XII grossly intact, motor grossly intact throughout PSYCH:  Cognitively intact, oriented to person place and time  ASSESSMENT AND PLAN

## 2011-07-03 NOTE — Assessment & Plan Note (Signed)
He had a low risk stress perfusion study. No new further cardiovascular testing is suggested. He will continue with risk reduction.

## 2011-07-03 NOTE — Assessment & Plan Note (Signed)
I reviewed his Dopplers. He has 60-79% right carotid stenosis and 40-59% left stenosis and is due for followup again in December.

## 2011-10-11 ENCOUNTER — Encounter (HOSPITAL_BASED_OUTPATIENT_CLINIC_OR_DEPARTMENT_OTHER): Payer: BC Managed Care – PPO | Attending: General Surgery

## 2011-10-11 DIAGNOSIS — W269XXA Contact with unspecified sharp object(s), initial encounter: Secondary | ICD-10-CM | POA: Insufficient documentation

## 2011-10-11 DIAGNOSIS — S61209A Unspecified open wound of unspecified finger without damage to nail, initial encounter: Secondary | ICD-10-CM | POA: Insufficient documentation

## 2011-10-11 DIAGNOSIS — L03019 Cellulitis of unspecified finger: Secondary | ICD-10-CM | POA: Insufficient documentation

## 2011-10-11 DIAGNOSIS — L02519 Cutaneous abscess of unspecified hand: Secondary | ICD-10-CM | POA: Insufficient documentation

## 2011-10-11 NOTE — H&P (Signed)
NAME:  Christopher Padilla, Christopher Padilla              ACCOUNT NO.:  [id number]  MEDICAL RECORD NO.:  [id number]  LOCATION:  FOOT                         FACILITY:  [facility]  PHYSICIAN:  Joanne Gavel, M.D.        DATE OF BIRTH:  [birth year]  DATE OF ADMISSION:  10/11/2011 DATE OF DISCHARGE:                             HISTORY & PHYSICAL   CHIEF COMPLAINT:  Wound, left index finger.  HISTORY OF PRESENT ILLNESS:  This is a 71 year old male with diabetes and severe arthritis who cut himself at the tip of his right index finger approximately 1 month ago.  This wound has failed to heal.  PAST MEDICAL HISTORY:  Significant for bilateral BK amputations, diabetic ulcer disease, CABG x4, lung collapse, fem-pop bypass bilaterally, and cataract surgery.  He has also had diabetes, arthritis, hypothyroidism, coronary artery disease, peripheral vascular disease, and neuropathy.  SOCIAL HISTORY:  Cigarettes none.  Alcohol none.  MEDICATIONS: 1. Lantus. 2. Humalog. 3. Nortriptyline. 4. Gabapentin. 5. Aspirin. 6. Thyroxine. 7. Terazol. 8. Ramipril. 9. Vytorin. 10.Cephalexin.  ALLERGIES:  CODEINE.  REVIEW OF SYSTEMS:  As above.  PHYSICAL EXAMINATION:  VITAL SIGNS: Temperature 98.5, pulse 77 and regular, respirations 19, blood pressure 121/69. GENERAL:  He is well developed, well nourished. CHEST:  Clear. HEART:  Regular rhythm. EXTREMITIES:  Examination of the hands reveals severe arthritis.  Both hands are essentially frozen.  At the tip of the left index finger, there is a 0.5 x 0.4 wound.  The tip of the phalanx is exposed.  IMPRESSION:  Post-traumatic wound, right index finger, bone is exposed.  PLAN OF TREATMENT:  Referred to Orthopedics.  He will need some debridement and possibly minor amputation.     Joanne Gavel, M.D.     RA/MEDQ  D:  10/11/2011  T:  10/11/2011  Job:  [id number]

## 2011-10-14 ENCOUNTER — Other Ambulatory Visit (HOSPITAL_COMMUNITY): Payer: Self-pay | Admitting: Orthopedic Surgery

## 2011-10-15 ENCOUNTER — Encounter (HOSPITAL_COMMUNITY): Payer: Self-pay | Admitting: Pharmacy Technician

## 2011-10-16 ENCOUNTER — Encounter (HOSPITAL_COMMUNITY): Payer: Self-pay | Admitting: *Deleted

## 2011-10-16 MED ORDER — CEFAZOLIN SODIUM 1-5 GM-% IV SOLN
1.0000 g | INTRAVENOUS | Status: AC
  Administered 2011-10-17: 1 g via INTRAVENOUS
  Filled 2011-10-16: qty 50

## 2011-10-17 ENCOUNTER — Encounter (HOSPITAL_COMMUNITY): Payer: Self-pay | Admitting: Certified Registered"

## 2011-10-17 ENCOUNTER — Ambulatory Visit (HOSPITAL_COMMUNITY): Payer: BC Managed Care – PPO | Admitting: Certified Registered"

## 2011-10-17 ENCOUNTER — Encounter (HOSPITAL_COMMUNITY): Payer: Self-pay | Admitting: *Deleted

## 2011-10-17 ENCOUNTER — Inpatient Hospital Stay (HOSPITAL_COMMUNITY)
Admission: AD | Admit: 2011-10-17 | Discharge: 2011-10-18 | DRG: 477 | Disposition: A | Discharge status: Home / Self Care | Payer: BC Managed Care – PPO | Source: Ambulatory Visit | Attending: Orthopedic Surgery | Admitting: Orthopedic Surgery

## 2011-10-17 ENCOUNTER — Encounter (HOSPITAL_COMMUNITY)
Admission: AD | Disposition: A | Discharge status: Home / Self Care | Payer: Self-pay | Source: Ambulatory Visit | Attending: Orthopedic Surgery

## 2011-10-17 DIAGNOSIS — M129 Arthropathy, unspecified: Secondary | ICD-10-CM | POA: Diagnosis present

## 2011-10-17 DIAGNOSIS — Z794 Long term (current) use of insulin: Secondary | ICD-10-CM

## 2011-10-17 DIAGNOSIS — E785 Hyperlipidemia, unspecified: Secondary | ICD-10-CM | POA: Diagnosis present

## 2011-10-17 DIAGNOSIS — I679 Cerebrovascular disease, unspecified: Secondary | ICD-10-CM | POA: Diagnosis present

## 2011-10-17 DIAGNOSIS — E1169 Type 2 diabetes mellitus with other specified complication: Principal | ICD-10-CM | POA: Diagnosis present

## 2011-10-17 DIAGNOSIS — I739 Peripheral vascular disease, unspecified: Secondary | ICD-10-CM | POA: Diagnosis present

## 2011-10-17 DIAGNOSIS — E039 Hypothyroidism, unspecified: Secondary | ICD-10-CM | POA: Diagnosis present

## 2011-10-17 DIAGNOSIS — I2510 Atherosclerotic heart disease of native coronary artery without angina pectoris: Secondary | ICD-10-CM | POA: Diagnosis present

## 2011-10-17 DIAGNOSIS — I252 Old myocardial infarction: Secondary | ICD-10-CM

## 2011-10-17 DIAGNOSIS — I059 Rheumatic mitral valve disease, unspecified: Secondary | ICD-10-CM | POA: Diagnosis present

## 2011-10-17 DIAGNOSIS — Z7982 Long term (current) use of aspirin: Secondary | ICD-10-CM

## 2011-10-17 DIAGNOSIS — M869 Osteomyelitis, unspecified: Secondary | ICD-10-CM | POA: Diagnosis present

## 2011-10-17 DIAGNOSIS — M9080 Osteopathy in diseases classified elsewhere, unspecified site: Secondary | ICD-10-CM | POA: Diagnosis present

## 2011-10-17 DIAGNOSIS — Z8673 Personal history of transient ischemic attack (TIA), and cerebral infarction without residual deficits: Secondary | ICD-10-CM

## 2011-10-17 DIAGNOSIS — Z87891 Personal history of nicotine dependence: Secondary | ICD-10-CM

## 2011-10-17 DIAGNOSIS — S88119A Complete traumatic amputation at level between knee and ankle, unspecified lower leg, initial encounter: Secondary | ICD-10-CM

## 2011-10-17 DIAGNOSIS — Z951 Presence of aortocoronary bypass graft: Secondary | ICD-10-CM

## 2011-10-17 DIAGNOSIS — K219 Gastro-esophageal reflux disease without esophagitis: Secondary | ICD-10-CM | POA: Diagnosis present

## 2011-10-17 HISTORY — DX: Unspecified osteoarthritis, unspecified site: M19.90

## 2011-10-17 HISTORY — DX: Acute myocardial infarction, unspecified: I21.9

## 2011-10-17 HISTORY — PX: AMPUTATION: SHX166

## 2011-10-17 HISTORY — DX: Gastro-esophageal reflux disease without esophagitis: K21.9

## 2011-10-17 LAB — COMPREHENSIVE METABOLIC PANEL
ALT: 23 U/L (ref 0–53)
AST: 20 U/L (ref 0–37)
Albumin: 3.4 g/dL — ABNORMAL LOW (ref 3.5–5.2)
Alkaline Phosphatase: 113 U/L (ref 39–117)
BUN: 19 mg/dL (ref 6–23)
CO2: 23 mEq/L (ref 19–32)
Calcium: 9.1 mg/dL (ref 8.4–10.5)
Chloride: 102 mEq/L (ref 96–112)
Creatinine, Ser: 1.35 mg/dL (ref 0.50–1.35)
GFR calc Af Amer: 59 mL/min — ABNORMAL LOW (ref >90)
GFR calc non Af Amer: 51 mL/min — ABNORMAL LOW (ref >90)
Glucose, Bld: 110 mg/dL — ABNORMAL HIGH (ref 70–99)
Potassium: 4 mEq/L (ref 3.5–5.1)
Sodium: 139 mEq/L (ref 135–145)
Total Bilirubin: 0.4 mg/dL (ref 0.3–1.2)
Total Protein: 7 g/dL (ref 6.0–8.3)

## 2011-10-17 LAB — GLUCOSE, CAPILLARY
Glucose-Capillary: 116 mg/dL — ABNORMAL HIGH (ref 70–99)
Glucose-Capillary: 123 mg/dL — ABNORMAL HIGH (ref 70–99)
Glucose-Capillary: 104 mg/dL — ABNORMAL HIGH (ref 70–99)

## 2011-10-17 LAB — APTT: aPTT: 27 seconds (ref 24–37)

## 2011-10-17 LAB — CBC
HCT: 42.9 % (ref 39.0–52.0)
MCHC: 33.1 g/dL (ref 30.0–36.0)
MCV: 87.9 fL (ref 78.0–100.0)
RDW: 13.6 % (ref 11.5–15.5)

## 2011-10-17 LAB — PROTIME-INR
INR: 0.99 (ref 0.00–1.49)
Prothrombin Time: 13.3 seconds (ref 11.6–15.2)

## 2011-10-17 LAB — MRSA PCR SCREENING: MRSA by PCR: NEGATIVE

## 2011-10-17 SURGERY — AMPUTATION DIGIT
Anesthesia: General | Site: Hand | Laterality: Left | Wound class: Dirty or Infected

## 2011-10-17 MED ORDER — ASPIRIN 81 MG PO TABS: 81.0000 mg | ORAL_TABLET | Freq: Every day | ORAL | Status: DC

## 2011-10-17 MED ORDER — INSULIN ASPART 100 UNIT/ML ~~LOC~~ SOLN
0.0000 [IU] | Freq: Three times a day (TID) | SUBCUTANEOUS | Status: DC
  Administered 2011-10-17: 5 [IU] via SUBCUTANEOUS
  Administered 2011-10-17: 2 [IU] via SUBCUTANEOUS

## 2011-10-17 MED ORDER — ONDANSETRON HCL 4 MG PO TABS
4.0000 mg | ORAL_TABLET | Freq: Four times a day (QID) | ORAL | Status: DC | PRN
  Filled 2011-10-17: qty 1

## 2011-10-17 MED ORDER — 0.9 % SODIUM CHLORIDE (POUR BTL) OPTIME
TOPICAL | Status: DC | PRN
  Administered 2011-10-17: 1000 mL

## 2011-10-17 MED ORDER — METOCLOPRAMIDE HCL 5 MG/ML IJ SOLN: 5.0000 mg | Freq: Three times a day (TID) | INTRAMUSCULAR | Status: DC | PRN

## 2011-10-17 MED ORDER — FENTANYL CITRATE 0.05 MG/ML IJ SOLN
INTRAMUSCULAR | Status: DC | PRN
  Administered 2011-10-17: 25 ug via INTRAVENOUS
  Administered 2011-10-17: 50 ug via INTRAVENOUS

## 2011-10-17 MED ORDER — ONDANSETRON HCL 4 MG/2ML IJ SOLN
4.0000 mg | Freq: Four times a day (QID) | INTRAMUSCULAR | Status: DC | PRN
  Administered 2011-10-18: 4 mg via INTRAVENOUS
  Filled 2011-10-17: qty 2

## 2011-10-17 MED ORDER — EZETIMIBE-SIMVASTATIN 10-40 MG PO TABS
1.0000 | ORAL_TABLET | Freq: Every day | ORAL | Status: DC
  Administered 2011-10-17 – 2011-10-18 (×2): 1 via ORAL
  Filled 2011-10-17 (×2): qty 1

## 2011-10-17 MED ORDER — MIDAZOLAM HCL 5 MG/5ML IJ SOLN
INTRAMUSCULAR | Status: DC | PRN
  Administered 2011-10-17 (×2): 1 mg via INTRAVENOUS

## 2011-10-17 MED ORDER — METOCLOPRAMIDE HCL 5 MG PO TABS
5.0000 mg | ORAL_TABLET | Freq: Three times a day (TID) | ORAL | Status: DC | PRN
  Filled 2011-10-17: qty 2

## 2011-10-17 MED ORDER — INSULIN GLARGINE 100 UNIT/ML ~~LOC~~ SOLN
40.0000 [IU] | Freq: Every day | SUBCUTANEOUS | Status: DC
  Administered 2011-10-17: via SUBCUTANEOUS

## 2011-10-17 MED ORDER — PANTOPRAZOLE SODIUM 40 MG PO TBEC
40.0000 mg | DELAYED_RELEASE_TABLET | Freq: Every day | ORAL | Status: DC
  Filled 2011-10-17: qty 1

## 2011-10-17 MED ORDER — MUPIROCIN 2 % EX OINT
TOPICAL_OINTMENT | CUTANEOUS | Status: AC
  Filled 2011-10-17: qty 22

## 2011-10-17 MED ORDER — OXYCODONE-ACETAMINOPHEN 5-325 MG PO TABS: 1.0000 | ORAL_TABLET | ORAL | Status: DC | PRN

## 2011-10-17 MED ORDER — ASPIRIN EC 81 MG PO TBEC
81.0000 mg | DELAYED_RELEASE_TABLET | Freq: Every day | ORAL | Status: DC
  Administered 2011-10-17 – 2011-10-18 (×2): 81 mg via ORAL
  Filled 2011-10-17 (×2): qty 1

## 2011-10-17 MED ORDER — NORTRIPTYLINE HCL 25 MG PO CAPS
25.0000 mg | ORAL_CAPSULE | Freq: Every day | ORAL | Status: DC
  Administered 2011-10-17: 25 mg via ORAL
  Filled 2011-10-17 (×3): qty 1

## 2011-10-17 MED ORDER — INSULIN ASPART 100 UNIT/ML ~~LOC~~ SOLN
4.0000 [IU] | Freq: Three times a day (TID) | SUBCUTANEOUS | Status: DC
  Administered 2011-10-17 (×2): 4 [IU] via SUBCUTANEOUS

## 2011-10-17 MED ORDER — ONDANSETRON HCL 4 MG/2ML IJ SOLN: 4.0000 mg | Freq: Once | INTRAMUSCULAR | Status: DC | PRN

## 2011-10-17 MED ORDER — GABAPENTIN 600 MG PO TABS
1200.0000 mg | ORAL_TABLET | Freq: Every day | ORAL | Status: DC
  Administered 2011-10-17: 1200 mg via ORAL
  Filled 2011-10-17 (×3): qty 2

## 2011-10-17 MED ORDER — LACTATED RINGERS IV SOLN
INTRAVENOUS | Status: DC | PRN
  Administered 2011-10-17: 08:00:00 via INTRAVENOUS

## 2011-10-17 MED ORDER — CEFAZOLIN SODIUM 1-5 GM-% IV SOLN
1.0000 g | Freq: Four times a day (QID) | INTRAVENOUS | Status: AC
  Administered 2011-10-17 – 2011-10-18 (×3): 1 g via INTRAVENOUS
  Filled 2011-10-17 (×3): qty 50

## 2011-10-17 MED ORDER — SODIUM CHLORIDE 0.9 % IV SOLN: INTRAVENOUS | Status: DC

## 2011-10-17 MED ORDER — HYDROMORPHONE HCL PF 1 MG/ML IJ SOLN: 0.2500 mg | INTRAMUSCULAR | Status: DC | PRN

## 2011-10-17 MED ORDER — LEVOTHYROXINE SODIUM 125 MCG PO TABS
125.0000 ug | ORAL_TABLET | Freq: Every day | ORAL | Status: DC
  Administered 2011-10-18: 125 ug via ORAL
  Filled 2011-10-17 (×3): qty 1

## 2011-10-17 MED ORDER — HYDROCODONE-ACETAMINOPHEN 5-325 MG PO TABS
1.0000 | ORAL_TABLET | ORAL | Status: DC | PRN
  Administered 2011-10-17 – 2011-10-18 (×5): 2 via ORAL
  Filled 2011-10-17 (×6): qty 2

## 2011-10-17 MED ORDER — HYDROMORPHONE HCL PF 1 MG/ML IJ SOLN
0.5000 mg | INTRAMUSCULAR | Status: DC | PRN
  Administered 2011-10-17 (×2): 0.5 mg via INTRAVENOUS
  Administered 2011-10-18: 1 mg via INTRAVENOUS
  Filled 2011-10-17 (×3): qty 1

## 2011-10-17 MED ORDER — RAMIPRIL 2.5 MG PO CAPS
2.5000 mg | ORAL_CAPSULE | Freq: Every day | ORAL | Status: DC
  Administered 2011-10-18: 2.5 mg via ORAL
  Filled 2011-10-17 (×2): qty 1

## 2011-10-17 MED ORDER — INSULIN GLARGINE 100 UNIT/ML ~~LOC~~ SOLN: 40.0000 [IU] | Freq: Every day | SUBCUTANEOUS | Status: DC

## 2011-10-17 SURGICAL SUPPLY — 51 items
BANDAGE GAUZE 4  KLING STR (GAUZE/BANDAGES/DRESSINGS) IMPLANT
BANDAGE GAUZE ELAST BULKY 4 IN (GAUZE/BANDAGES/DRESSINGS) ×2 IMPLANT
BLADE AVERAGE 25X9 (BLADE) IMPLANT
BLADE MINI RND TIP GREEN BEAV (BLADE) IMPLANT
BNDG COHESIVE 1X5 TAN STRL LF (GAUZE/BANDAGES/DRESSINGS) ×2 IMPLANT
BNDG COHESIVE 6X5 TAN STRL LF (GAUZE/BANDAGES/DRESSINGS) IMPLANT
BNDG ESMARK 4X9 LF (GAUZE/BANDAGES/DRESSINGS) IMPLANT
BNDG GAUZE STRTCH 6 (GAUZE/BANDAGES/DRESSINGS) IMPLANT
CLOTH BEACON ORANGE TIMEOUT ST (SAFETY) ×2 IMPLANT
CORDS BIPOLAR (ELECTRODE) IMPLANT
COVER SURGICAL LIGHT HANDLE (MISCELLANEOUS) ×2 IMPLANT
CUFF TOURNIQUET SINGLE 18IN (TOURNIQUET CUFF) IMPLANT
CUFF TOURNIQUET SINGLE 24IN (TOURNIQUET CUFF) IMPLANT
CUFF TOURNIQUET SINGLE 34IN LL (TOURNIQUET CUFF) IMPLANT
CUFF TOURNIQUET SINGLE 44IN (TOURNIQUET CUFF) IMPLANT
DRAPE U-SHAPE 47X51 STRL (DRAPES) ×2 IMPLANT
DRSG ADAPTIC 3X8 NADH LF (GAUZE/BANDAGES/DRESSINGS) IMPLANT
DRSG EMULSION OIL 3X3 NADH (GAUZE/BANDAGES/DRESSINGS) ×2 IMPLANT
DURAPREP 26ML APPLICATOR (WOUND CARE) ×2 IMPLANT
ELECT REM PT RETURN 9FT ADLT (ELECTROSURGICAL) ×2
ELECTRODE REM PT RTRN 9FT ADLT (ELECTROSURGICAL) ×1 IMPLANT
GAUZE SPONGE 2X2 8PLY STRL LF (GAUZE/BANDAGES/DRESSINGS) IMPLANT
GLOVE BIOGEL PI IND STRL 7.5 (GLOVE) ×1 IMPLANT
GLOVE BIOGEL PI IND STRL 9 (GLOVE) ×1 IMPLANT
GLOVE BIOGEL PI INDICATOR 7.5 (GLOVE) ×1
GLOVE BIOGEL PI INDICATOR 9 (GLOVE) ×1
GLOVE SURG ORTHO 9.0 STRL STRW (GLOVE) ×2 IMPLANT
GLOVE SURG SS PI 7.5 STRL IVOR (GLOVE) ×4 IMPLANT
GOWN PREVENTION PLUS XLARGE (GOWN DISPOSABLE) ×2 IMPLANT
GOWN SRG XL XLNG 56XLVL 4 (GOWN DISPOSABLE) ×2 IMPLANT
GOWN STRL NON-REIN XL XLG LVL4 (GOWN DISPOSABLE) ×2
KIT BASIN OR (CUSTOM PROCEDURE TRAY) ×2 IMPLANT
KIT ROOM TURNOVER OR (KITS) ×2 IMPLANT
MANIFOLD NEPTUNE II (INSTRUMENTS) ×2 IMPLANT
NEEDLE HYPO 25GX1X1/2 BEV (NEEDLE) IMPLANT
NS IRRIG 1000ML POUR BTL (IV SOLUTION) ×2 IMPLANT
PACK ORTHO EXTREMITY (CUSTOM PROCEDURE TRAY) ×2 IMPLANT
PAD ARMBOARD 7.5X6 YLW CONV (MISCELLANEOUS) ×2 IMPLANT
PAD CAST 4YDX4 CTTN HI CHSV (CAST SUPPLIES) IMPLANT
PADDING CAST COTTON 4X4 STRL (CAST SUPPLIES)
SPECIMEN JAR SMALL (MISCELLANEOUS) ×2 IMPLANT
SPONGE GAUZE 2X2 STER 10/PKG (GAUZE/BANDAGES/DRESSINGS)
SPONGE GAUZE 4X4 12PLY (GAUZE/BANDAGES/DRESSINGS) ×2 IMPLANT
SUCTION FRAZIER TIP 10 FR DISP (SUCTIONS) IMPLANT
SUT ETHILON 2 0 PSLX (SUTURE) ×2 IMPLANT
SUT VIC AB 2-0 FS1 27 (SUTURE) IMPLANT
SYR CONTROL 10ML LL (SYRINGE) IMPLANT
TOWEL OR 17X24 6PK STRL BLUE (TOWEL DISPOSABLE) ×2 IMPLANT
TOWEL OR 17X26 10 PK STRL BLUE (TOWEL DISPOSABLE) ×2 IMPLANT
TUBE CONNECTING 12X1/4 (SUCTIONS) IMPLANT
WATER STERILE IRR 1000ML POUR (IV SOLUTION) IMPLANT

## 2011-10-17 NOTE — Op Note (Signed)
OPERATIVE REPORT  DATE OF SURGERY: 10/17/2011  PATIENT:  Silva Bandy,  71 y.o. male  PRE-OPERATIVE DIAGNOSIS:  Osteomyelitis Left Index Finger  POST-OPERATIVE DIAGNOSIS:  Osteomyelitis Left Index Finger  PROCEDURE:  Procedure(s): AMPUTATION DIGIT  SURGEON:  Surgeon(s): [name], MD  ANESTHESIA:   regional  EBL:  Minimal ML  SPECIMEN:  Source of Specimen:  Left index finger  TOURNIQUET:  * No tourniquets in log *  PROCEDURE DETAILS: Patient is a 71 year old gentleman with diabetes peripheral vascular disease status post bilateral transtibial amputations who presents at this time with osteomyelitis abscess ulceration left index finger. Presents for amputation of the digit. Risks and benefits were discussed including nonhealing of the wound need for higher level amputation. Patient states he understands was pursued this time. Description of procedure patient was brought to or room 4 after undergoing a wrist block. After adequate levels of anesthesia were obtained patient's left upper thumb is prepped using DuraPrep and draped into a sterile field. A fishmouth type incision was made just distal to the PIP joint. The digit was amputated through the PIP joint and the condyles were beveled. The wound is irrigated with normal saline hemostasis was obtained the incision was closed using 2-0 nylon. The wound is covered with Adaptic orthopedic sponges Kerlix and Coban. Patient was taken to the PACU in stable condition  PLAN OF CARE: Admit to inpatient   PATIENT DISPOSITION:  PACU - hemodynamically stable.   [name], MD 10/17/2011 9:05 AM

## 2011-10-17 NOTE — Anesthesia Procedure Notes (Signed)
Anesthesia Regional Block:  Wrist block  Pre-Anesthetic Checklist: ,, timeout performed, Correct Patient, Correct Site, Correct Laterality, Correct Procedure, Correct Position, site marked, Risks and benefits discussed,  Surgical consent,  Pre-op evaluation,  At surgeon's request and post-op pain management  Laterality: Left  Prep: Maximum Sterile Barrier Precautions used and chloraprep       Needles:  Injection technique: Single-shot    Needle insertion depth: 2 cm   Additional Needles: Wrist block Narrative:  Start time: 10/17/2011 8:35 AM End time: 10/17/2011 8:40 AM Anesthesiologist: Maren Beach MD  Additional Notes: 20cc 2.0% Lidocaine w/o epi radial and med. Nerves w/o difficulty or discomfort. GES

## 2011-10-17 NOTE — Anesthesia Postprocedure Evaluation (Signed)
  Anesthesia Post-op Note  Patient: Christopher Padilla  Procedure(s) Performed: Procedure(s) (LRB): AMPUTATION DIGIT (Left)  Patient Location: PACU  Anesthesia Type: MAC and MAC combined with regional for post-op pain  Level of Consciousness: awake, alert  and oriented  Airway and Oxygen Therapy: Patient Spontanous Breathing  Post-op Pain: none  Post-op Assessment: Post-op Vital signs reviewed and Patient's Cardiovascular Status Stable  Post-op Vital Signs: stable  Complications: No apparent anesthesia complications

## 2011-10-17 NOTE — Anesthesia Preprocedure Evaluation (Addendum)
Anesthesia Evaluation  Patient identified by MRN, date of birth, ID band Patient awake    Reviewed: Allergy & Precautions, H&P , NPO status , Patient's Chart, lab work & pertinent test results  History of Anesthesia Complications Negative for: history of anesthetic complications  Airway Mallampati: II      Dental   Pulmonary  breath sounds clear to auscultation        Cardiovascular + CAD, + Past MI and + CABG Rhythm:Regular     Neuro/Psych    GI/Hepatic GERD-  ,  Endo/Other  Diabetes mellitus-, Type 2, Insulin DependentHypothyroidism   Renal/GU      Musculoskeletal   Abdominal   Peds  Hematology   Anesthesia Other Findings   Reproductive/Obstetrics                          Anesthesia Physical Anesthesia Plan  ASA: III  Anesthesia Plan: MAC and Regional   Post-op Pain Management:    Induction:   Airway Management Planned: Nasal Cannula  Additional Equipment:   Intra-op Plan:   Post-operative Plan:   Informed Consent: I have reviewed the patients History and Physical, chart, labs and discussed the procedure including the risks, benefits and alternatives for the proposed anesthesia with the patient or authorized representative who has indicated his/her understanding and acceptance.     Plan Discussed with: Anesthesiologist, Surgeon and CRNA  Anesthesia Plan Comments:        Anesthesia Quick Evaluation

## 2011-10-17 NOTE — Transfer of Care (Signed)
Immediate Anesthesia Transfer of Care Note  Patient: Christopher Padilla  Procedure(s) Performed: Procedure(s) (LRB): AMPUTATION DIGIT (Left)  Patient Location: PACU  Anesthesia Type: MAC  Level of Consciousness: awake, alert  and oriented  Airway & Oxygen Therapy: Patient Spontanous Breathing and Patient connected to nasal cannula oxygen  Post-op Assessment: Report given to PACU RN and Post -op Vital signs reviewed and stable  Post vital signs: Reviewed and stable  Complications: No apparent anesthesia complications

## 2011-10-17 NOTE — H&P (Signed)
Christopher Padilla is an 71 y.o. male.   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  Past Medical History  Diagnosis Date  . CAD (coronary artery disease)     S/P CABG, Feb 2003, LIMA to the LAD, left free radial artery to an obtuse marginal, spahenous vein graft to diagonal, saphenous vein graft to RCA with endarterectomy  . S/P below knee amputation     Left  . Cerebrovascular disease     MRA in 2005 with 75% stenosis in distal right vertebral artery and 75% stenosis greater in distal cervical internal carotid artery on teh right, severe bilateral disease and MRA of the extracranial circulation, high grade stenosis of the distal right internal carotid artery at the junction of the cervical internal carotid artery of the skull base  . Diabetes mellitus   . Hyperlipidemia   . Hypothyroidism   . History of tobacco use     Quit in 1990  . Myocardial infarction   . GERD (gastroesophageal reflux disease)   . Arthritis     Past Surgical History  Procedure Date  . Leg amputation below knee     Bilateral  . Arterial bypass surgry     Left; left femoral to posterior tibial bypass gracting, left femoral artery and deep femoral artery endarterectomy with vein patch angioplasty of the common femoral arter and deep femoral artery 06/2005, right fremoral popliteal bypass, right iliac artery restent, bilateral below knee amputations  . Thoracotomy     With drainage of a hemathroax and decortication of fibrothorax  . Coronary artery bypass graft 2002    By [name], MD  . Cataract extraction     Bilateral  . Carpal tunnel release     Right    Family History  Problem Relation Age of Onset  . Cancer Mother   . Diabetes Father   . Coronary artery  disease Father    Social History:  reports that he quit smoking about 23 years ago. He does not have any smokeless tobacco history on file. He reports that he does not drink alcohol or use illicit drugs.  Allergies:  Allergies  Allergen Reactions  . Codeine     REACTION: Reaction not known    Medications Prior to Admission  Medication Sig Dispense Refill  . aspirin 81 MG tablet Take 81 mg by mouth daily.        . Cholecalciferol (VITAMIN D PO) Take 1 tablet by mouth 2 (two) times daily.      Marland Kitchen ezetimibe-simvastatin (VYTORIN) 10-40 MG per tablet Take 1 tablet by mouth daily.        Marland Kitchen gabapentin (NEURONTIN) 600 MG tablet Take 1,200 mg by mouth at bedtime.      . insulin glargine (LANTUS) 100 UNIT/ML injection Inject 40 Units into the skin at bedtime.       . insulin lispro (HUMALOG) 100 UNIT/ML injection Inject 0-20 Units into the skin as directed. Sliding scale      . levothyroxine (SYNTHROID, LEVOTHROID) 125 MCG tablet Take 125 mcg by mouth daily.        . nortriptyline (PAMELOR) 25 MG capsule Take 25 mg by mouth at bedtime.      Marland Kitchen omeprazole (PRILOSEC) 20 MG capsule Take 20 mg by mouth daily.        Marland Kitchen  ramipril (ALTACE) 2.5 MG capsule Take 2.5 mg by mouth daily.          Results for orders placed during the hospital encounter of 10/17/11 (from the past 48 hour(s))  GLUCOSE, CAPILLARY     Status: Abnormal   Collection Time   10/17/11  7:00 AM      Component Value Range Comment   Glucose-Capillary 116 (*) 70 - 99 mg/dL    No results found.  Review of Systems  All other systems reviewed and are negative.    Blood pressure 155/88, pulse 71, temperature 97.6 F (36.4 C), temperature source Oral, resp. rate 18, height 5\' 8"  (1.727 m), weight 74.39 kg (164 lb), SpO2 96.00%. Physical Exam  On examination patient is no a centering cellulitis he has exposed bone with a draining ulcer from the tip of the left index finger radiograph shows lytic changes of the  bone. Assessment/Plan Assessment: Osteomyelitis abscess left index finger distal phalanx.   Plan will plan for amputation of the index finger. Risks and benefits of surgery were discussed including persistent infection nonhealing of the wound need for additional surgery. Patient states she understands and wished to proceed at this time.  Fancy Dunkley V 10/17/2011, 7:38 AM

## 2011-10-17 NOTE — Progress Notes (Signed)
UR COMPLETED  

## 2011-10-18 ENCOUNTER — Encounter (HOSPITAL_COMMUNITY): Payer: Self-pay | Admitting: Orthopedic Surgery

## 2011-10-18 LAB — GLUCOSE, CAPILLARY: Glucose-Capillary: 113 mg/dL — ABNORMAL HIGH (ref 70–99)

## 2011-10-18 MED ORDER — HYDROCODONE-ACETAMINOPHEN 5-500 MG PO TABS: 1.0000 | ORAL_TABLET | Freq: Four times a day (QID) | ORAL | Status: AC | PRN

## 2011-10-18 NOTE — Discharge Instructions (Signed)
Change dressing on Monday okay to shower after that time use a Band-Aid and antibiotic ointment for dressing changes after Monday. Followup in the office in 2 weeks.

## 2011-10-18 NOTE — Discharge Summary (Signed)
Physician Discharge Summary  Patient ID: COAL NEARHOOD MRN: [id number] DOB/AGE: 10-Feb-1941 71 y.o.  Admit date: 10/17/2011 Discharge date: 10/18/2011  Admission Diagnoses: Osteomyelitis left index finger  Discharge Diagnoses: Same Active Problems:  * No active hospital problems. *    Discharged Condition: stable  Hospital Course: Patient's hospital course was essentially unremarkable he underwent amputation left index finger through the PIP joint. Postoperatively patient progressed well and he was discharged to home in stable condition.  Consults: None  Significant Diagnostic Studies: labs: Routine labs  Treatments: surgery: Please see operative note  Discharge Exam: Blood pressure 174/80, pulse 79, temperature 97.5 F (36.4 C), temperature source Oral, resp. rate 14, height 5\' 8"  (1.727 m), weight 74.39 kg (164 lb), SpO2 95.00%. Incision/Wound: clean and dry  Disposition:   Discharge Orders    Future Orders Please Complete By Expires   Diet - low sodium heart healthy      Call MD / Call 911      Comments:   If you experience chest pain or shortness of breath, CALL 911 and be transported to the hospital emergency room.  If you develope a fever above 101 F, pus (white drainage) or increased drainage or redness at the wound, or calf pain, call your surgeon's office.   Constipation Prevention      Comments:   Drink plenty of fluids.  Prune juice may be helpful.  You may use a stool softener, such as Colace (over the counter) 100 mg twice a day.  Use MiraLax (over the counter) for constipation as needed.   Increase activity slowly as tolerated        Medication List  As of 10/18/2011  6:55 AM   TAKE these medications         aspirin 81 MG tablet   Take 81 mg by mouth daily.      ezetimibe-simvastatin 10-40 MG per tablet   Commonly known as: VYTORIN   Take 1 tablet by mouth daily.      gabapentin 600 MG tablet   Commonly known as: NEURONTIN   Take 1,200 mg by mouth  at bedtime.      HYDROcodone-acetaminophen 5-500 MG per tablet   Commonly known as: VICODIN   Take 1 tablet by mouth every 6 (six) hours as needed for pain.      insulin glargine 100 UNIT/ML injection   Commonly known as: LANTUS   Inject 40 Units into the skin at bedtime.      insulin lispro 100 UNIT/ML injection   Commonly known as: HUMALOG   Inject 0-20 Units into the skin as directed. Sliding scale      levothyroxine 125 MCG tablet   Commonly known as: SYNTHROID, LEVOTHROID   Take 125 mcg by mouth daily.      nortriptyline 25 MG capsule   Commonly known as: PAMELOR   Take 25 mg by mouth at bedtime.      omeprazole 20 MG capsule   Commonly known as: PRILOSEC   Take 20 mg by mouth daily.      ramipril 2.5 MG capsule   Commonly known as: ALTACE   Take 2.5 mg by mouth daily.      VITAMIN D PO   Take 1 tablet by mouth 2 (two) times daily.           Follow-up Information    Follow up with Ryenne Lynam V, MD in 2 weeks.   Contact information:   [address]  [place] [address] [phone]          Signed: [name] 10/18/2011, 6:55 AM

## 2011-11-08 ENCOUNTER — Encounter (HOSPITAL_BASED_OUTPATIENT_CLINIC_OR_DEPARTMENT_OTHER): Payer: BC Managed Care – PPO

## 2012-05-19 ENCOUNTER — Encounter: Payer: Self-pay | Admitting: Cardiology

## 2012-07-31 ENCOUNTER — Telehealth: Payer: Self-pay | Admitting: Family Medicine

## 2012-08-01 NOTE — Telephone Encounter (Signed)
Called into [facility], pt aware

## 2012-08-04 ENCOUNTER — Other Ambulatory Visit: Payer: Self-pay | Admitting: Family Medicine

## 2012-08-04 MED ORDER — EZETIMIBE-SIMVASTATIN 10-40 MG PO TABS: 1.0000 | ORAL_TABLET | Freq: Every day | ORAL | Status: DC

## 2012-08-04 MED ORDER — OMEPRAZOLE 20 MG PO CPDR: 20.0000 mg | DELAYED_RELEASE_CAPSULE | Freq: Every day | ORAL | Status: DC

## 2012-08-04 MED ORDER — INSULIN GLARGINE 100 UNIT/ML ~~LOC~~ SOLN: 40.0000 [IU] | Freq: Every day | SUBCUTANEOUS | Status: DC

## 2012-08-04 MED ORDER — RAMIPRIL 2.5 MG PO CAPS: 2.5000 mg | ORAL_CAPSULE | Freq: Every day | ORAL | Status: DC

## 2012-08-04 MED ORDER — NORTRIPTYLINE HCL 25 MG PO CAPS: 25.0000 mg | ORAL_CAPSULE | Freq: Every day | ORAL | Status: DC

## 2012-09-07 ENCOUNTER — Other Ambulatory Visit: Payer: Self-pay | Admitting: *Deleted

## 2012-09-07 MED ORDER — GABAPENTIN 600 MG PO TABS: 600.0000 mg | ORAL_TABLET | Freq: Three times a day (TID) | ORAL | Status: DC

## 2012-09-14 ENCOUNTER — Ambulatory Visit (INDEPENDENT_AMBULATORY_CARE_PROVIDER_SITE_OTHER): Payer: Medicare Other | Admitting: Family Medicine

## 2012-09-14 ENCOUNTER — Encounter: Payer: Self-pay | Admitting: Family Medicine

## 2012-09-14 VITALS — BP 130/76 | HR 74 | Temp 96.5°F | Ht 66.0 in | Wt 160.8 lb

## 2012-09-14 DIAGNOSIS — E785 Hyperlipidemia, unspecified: Secondary | ICD-10-CM

## 2012-09-14 DIAGNOSIS — R5383 Other fatigue: Secondary | ICD-10-CM

## 2012-09-14 DIAGNOSIS — E559 Vitamin D deficiency, unspecified: Secondary | ICD-10-CM

## 2012-09-14 DIAGNOSIS — E039 Hypothyroidism, unspecified: Secondary | ICD-10-CM

## 2012-09-14 DIAGNOSIS — I2510 Atherosclerotic heart disease of native coronary artery without angina pectoris: Secondary | ICD-10-CM

## 2012-09-14 DIAGNOSIS — N400 Enlarged prostate without lower urinary tract symptoms: Secondary | ICD-10-CM

## 2012-09-14 DIAGNOSIS — Z1211 Encounter for screening for malignant neoplasm of colon: Secondary | ICD-10-CM

## 2012-09-14 DIAGNOSIS — L989 Disorder of the skin and subcutaneous tissue, unspecified: Secondary | ICD-10-CM

## 2012-09-14 DIAGNOSIS — E119 Type 2 diabetes mellitus without complications: Secondary | ICD-10-CM

## 2012-09-14 MED ORDER — INSULIN GLARGINE 100 UNIT/ML ~~LOC~~ SOLN: 40.0000 [IU] | Freq: Every day | SUBCUTANEOUS | Status: DC

## 2012-09-14 NOTE — Progress Notes (Signed)
  Subjective:    Patient ID: Christopher Padilla, male    DOB: [birth year], 72 y.o.   MRN: [id number]  HPI This patient presents for recheck of multiple medical problems. No one accompanies the patient today.  Patient Active Problem List   Diagnosis Date Noted  . Mitral regurgitation 07/03/2011  . HYPOTHYROIDISM 11/05/2008  . DM 11/05/2008  . HYPERLIPIDEMIA 11/05/2008  . CEREBROVASCULAR DISEASE 11/05/2008  . TOBACCO ABUSE, HX OF 11/05/2008  . CAD 10/04/2008  . HEART FAILURE, COMBINED UNSPEC. 10/04/2008    In addition, see ROS, patient is having problems with both below the knee prosthesises. He had new prostheses made and they are not fitting correctly. A blister developed on the left, but is healing since he went back on the old prosthesis.  The allergies, current medications, past medical history, surgical history, family and social history are reviewed.  Immunizations reviewed.  Health maintenance reviewed.  The following items are outstanding: Colonoscopy and PSA.      Review of Systems  Constitutional: Positive for activity change (slight due to prosthesis).  HENT: Positive for sneezing (due to allergies) and postnasal drip (slight).   Eyes: Negative.   Respiratory: Negative.   Cardiovascular: Negative.   Gastrointestinal: Negative.   Endocrine: Negative.   Genitourinary: Negative.   Musculoskeletal: Positive for myalgias (neuropathy) and arthralgias (legs, arms, hands).  Skin: Positive for wound (L jaw).  Allergic/Immunologic: Positive for environmental allergies.  Neurological: Negative.   Psychiatric/Behavioral: Positive for sleep disturbance (due to leg pain).       Objective:   Physical Exam BP 130/76  Pulse 74  Temp(Src) 96.5 F (35.8 C) (Oral)  Ht 5\' 6"  (1.676 m)  Wt 160 lb 12.8 oz (72.938 kg)  BMI 25.97 kg/m2  The patient appeared well nourished and normally developed, alert and oriented to time and place. Speech, behavior and judgement appear  normal. Vital signs as documented.  Head exam is unremarkable. No scleral icterus or pallor noted.  Neck is without jugular venous distension, or thyromegally. He does have bilateral carotid bruits and supraclavicular bruits . Carotid upstrokes are brisk bilaterally. No cervical adenopathy. Lungs are clear anteriorly and posteriorly to auscultation. Normal respiratory effort. Cardiac exam reveals regular rate and rhythm at 84 per minute. First and second heart sounds normal.  No murmurs, rubs or gallops.  Abdominal exam reveals , no masses, no organomegaly and no aortic enlargement. No inguinal adenopathy. Both femoral  are normal. He has BKA bilaterally .There is a small blister on the left lateral knee Skin without pallor or jaundice.  Warm and dry, without rash. He does have an apparent skin cancer anterior to the left ear about three quarters of an inch in diameter Neurologic exam reveals normal deep tendon reflexes and normal sensation.          Assessment & Plan:  1. CAD  2. DM  3. HYPERLIPIDEMIA  4. HYPOTHYROIDISM  5. Bilateral carotid bruits  6. 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 Rollene Rotunda

## 2012-09-14 NOTE — Patient Instructions (Signed)
Continue medications as doing Be careful not to fall We will be scheduling a visit with the dermatologist as soon as possible Return to clinic tomorrow for lab work We will also check on the visit time with Dr. Rollene Rotunda

## 2012-09-14 NOTE — Addendum Note (Signed)
Addended by: Bearl Mulberry on: 09/14/2012 06:15 PM   Modules accepted: Orders

## 2012-09-15 ENCOUNTER — Encounter: Payer: Self-pay | Admitting: Gastroenterology

## 2012-09-29 ENCOUNTER — Other Ambulatory Visit (INDEPENDENT_AMBULATORY_CARE_PROVIDER_SITE_OTHER): Payer: Medicare Other

## 2012-09-29 DIAGNOSIS — E785 Hyperlipidemia, unspecified: Secondary | ICD-10-CM

## 2012-09-29 DIAGNOSIS — R5381 Other malaise: Secondary | ICD-10-CM

## 2012-09-29 DIAGNOSIS — R5383 Other fatigue: Secondary | ICD-10-CM

## 2012-09-29 DIAGNOSIS — N400 Enlarged prostate without lower urinary tract symptoms: Secondary | ICD-10-CM

## 2012-09-29 DIAGNOSIS — E119 Type 2 diabetes mellitus without complications: Secondary | ICD-10-CM

## 2012-09-29 DIAGNOSIS — E559 Vitamin D deficiency, unspecified: Secondary | ICD-10-CM

## 2012-09-29 LAB — POCT CBC
Granulocyte percent: 65 %G (ref 37–80)
HCT, POC: 41.6 % — AB (ref 43.5–53.7)
Lymph, poc: 1.7 (ref 0.6–3.4)
MCHC: 33.5 g/dL (ref 31.8–35.4)
MPV: 8.2 fL (ref 0–99.8)
POC Granulocyte: 4 (ref 2–6.9)
POC LYMPH PERCENT: 27.7 %L (ref 10–50)
Platelet Count, POC: 189 10*3/uL (ref 142–424)
RDW, POC: 14.1 %
WBC: 6.2 10*3/uL (ref 4.6–10.2)

## 2012-09-29 LAB — BASIC METABOLIC PANEL WITH GFR
Calcium: 9 mg/dL (ref 8.4–10.5)
GFR, Est African American: 68 mL/min
GFR, Est Non African American: 58 mL/min — ABNORMAL LOW
Glucose, Bld: 124 mg/dL — ABNORMAL HIGH (ref 70–99)
Potassium: 4.7 mEq/L (ref 3.5–5.3)
Sodium: 139 mEq/L (ref 135–145)

## 2012-09-29 LAB — PSA: PSA: 0.98 ng/mL (ref <=4.00)

## 2012-09-29 LAB — HEPATIC FUNCTION PANEL
ALT: 22 U/L (ref 0–53)
Bilirubin, Direct: 0.1 mg/dL (ref 0.0–0.3)

## 2012-09-30 LAB — VITAMIN D 25 HYDROXY (VIT D DEFICIENCY, FRACTURES): Vit D, 25-Hydroxy: 38 ng/mL (ref 30–89)

## 2012-10-01 ENCOUNTER — Encounter: Payer: Self-pay | Admitting: Physician Assistant

## 2012-10-01 ENCOUNTER — Other Ambulatory Visit: Payer: Self-pay | Admitting: Family Medicine

## 2012-10-01 ENCOUNTER — Ambulatory Visit (INDEPENDENT_AMBULATORY_CARE_PROVIDER_SITE_OTHER): Payer: Medicare Other | Admitting: Physician Assistant

## 2012-10-01 VITALS — BP 146/73 | HR 87 | Temp 97.1°F | Wt 160.0 lb

## 2012-10-01 DIAGNOSIS — M545 Low back pain: Secondary | ICD-10-CM

## 2012-10-01 LAB — POCT UA - MICROSCOPIC ONLY
Crystals, Ur, HPF, POC: NEGATIVE
RBC, urine, microscopic: NEGATIVE
WBC, Ur, HPF, POC: NEGATIVE
Yeast, UA: NEGATIVE

## 2012-10-01 LAB — POCT URINALYSIS DIPSTICK
Blood, UA: NEGATIVE
Ketones, UA: NEGATIVE
Spec Grav, UA: 1.025
Urobilinogen, UA: NEGATIVE

## 2012-10-01 LAB — NMR LIPOPROFILE WITH LIPIDS
HDL Size: 9.6 nm (ref >=9.2)
LDL (calc): 44 mg/dL (ref <100)
LDL Particle Number: 542 nmol/L (ref <1000)
LP-IR Score: 39 (ref <=45)
Large VLDL-P: 2 nmol/L (ref <=2.7)
Small LDL Particle Number: 410 nmol/L (ref <=527)
Triglycerides: 49 mg/dL (ref <150)
VLDL Size: 46.2 nm (ref <=46.6)

## 2012-10-01 MED ORDER — MELOXICAM 15 MG PO TABS: 15.0000 mg | ORAL_TABLET | Freq: Every day | ORAL | Status: DC

## 2012-10-01 NOTE — Progress Notes (Signed)
Subjective:     Patient ID: Christopher Padilla, male   DOB: Sep 18, 1940, 71 y.o.   MRN: [id number]  Back Pain This is a new problem. The current episode started in the past 7 days. The problem occurs constantly. The problem is unchanged. The pain is present in the lumbar spine and sacro-iliac. The quality of the pain is described as aching. The pain radiates to the right thigh. The pain is at a severity of 5/10. The pain is mild. The pain is the same all the time. The symptoms are aggravated by position, twisting and bending. Stiffness is present in the morning. Risk factors: Bilat BKA with proseth. He has tried heat and ice for the symptoms. The treatment provided mild relief.     Review of Systems  Musculoskeletal: Positive for back pain.  All other systems reviewed and are negative.       Objective:   Physical Exam  Nursing note and vitals reviewed. No ecchy/edema No palp muscle spasm + TTP R L-spine/SI joint FROM of the L-spine- increase in sx at extremes Unable to do SLR due to bilat BKA UA- see labs     Assessment:     1. Low back pain        Plan:     Suspect muscular in nature Mobic short course Heat/Ice  Stretching F/U prn

## 2012-10-01 NOTE — Patient Instructions (Signed)
Back Pain, Adult  Low back pain is very common. About 1 in 5 people have back pain. The cause of low back pain is rarely dangerous. The pain often gets better over time. About half of people with a sudden onset of back pain feel better in just 2 weeks. About 8 in 10 people feel better by 6 weeks.   CAUSES  Some common causes of back pain include:  · Strain of the muscles or ligaments supporting the spine.  · Wear and tear (degeneration) of the spinal discs.  · Arthritis.  · Direct injury to the back.  DIAGNOSIS  Most of the time, the direct cause of low back pain is not known. However, back pain can be treated effectively even when the exact cause of the pain is unknown. Answering your caregiver's questions about your overall health and symptoms is one of the most accurate ways to make sure the cause of your pain is not dangerous. If your caregiver needs more information, he or she may order lab work or imaging tests (X-rays or MRIs). However, even if imaging tests show changes in your back, this usually does not require surgery.  HOME CARE INSTRUCTIONS  For many people, back pain returns. Since low back pain is rarely dangerous, it is often a condition that people can learn to manage on their own.   · Remain active. It is stressful on the back to sit or stand in one place. Do not sit, drive, or stand in one place for more than 30 minutes at a time. Take short walks on level surfaces as soon as pain allows. Try to increase the length of time you walk each day.  · Do not stay in bed. Resting more than 1 or 2 days can delay your recovery.  · Do not avoid exercise or work. Your body is made to move. It is not dangerous to be active, even though your back may hurt. Your back will likely heal faster if you return to being active before your pain is gone.  · Pay attention to your body when you  bend and lift. Many people have less discomfort when lifting if they bend their knees, keep the load close to their bodies, and  avoid twisting. Often, the most comfortable positions are those that put less stress on your recovering back.  · Find a comfortable position to sleep. Use a firm mattress and lie on your side with your knees slightly bent. If you lie on your back, put a pillow under your knees.  · Only take over-the-counter or prescription medicines as directed by your caregiver. Over-the-counter medicines to reduce pain and inflammation are often the most helpful. Your caregiver may prescribe muscle relaxant drugs. These medicines help dull your pain so you can more quickly return to your normal activities and healthy exercise.  · Put ice on the injured area.  · Put ice in a plastic bag.  · Place a towel between your skin and the bag.  · Leave the ice on for 15-20 minutes, 3-4 times a day for the first 2 to 3 days. After that, ice and heat may be alternated to reduce pain and spasms.  · Ask your caregiver about trying back exercises and gentle massage. This may be of some benefit.  · Avoid feeling anxious or stressed. Stress increases muscle tension and can worsen back pain. It is important to recognize when you are anxious or stressed and learn ways to manage it. Exercise is a great option.  SEEK MEDICAL CARE IF:  · You have pain that is not relieved with rest or   medicine.  · You have pain that does not improve in 1 week.  · You have new symptoms.  · You are generally not feeling well.  SEEK IMMEDIATE MEDICAL CARE IF:   · You have pain that radiates from your back into your legs.  · You develop new bowel or bladder control problems.  · You have unusual weakness or numbness in your arms or legs.  · You develop nausea or vomiting.  · You develop abdominal pain.  · You feel faint.  Document Released: 04/22/2005 Document Revised: 10/22/2011 Document Reviewed: 09/10/2010  [employer]® Patient Information ©2014 [employer].

## 2012-10-08 ENCOUNTER — Ambulatory Visit (AMBULATORY_SURGERY_CENTER): Payer: Medicare Other | Admitting: *Deleted

## 2012-10-08 ENCOUNTER — Encounter: Payer: Self-pay | Admitting: Gastroenterology

## 2012-10-08 VITALS — Ht 68.0 in | Wt 163.2 lb

## 2012-10-08 DIAGNOSIS — Z1211 Encounter for screening for malignant neoplasm of colon: Secondary | ICD-10-CM

## 2012-10-08 MED ORDER — MOVIPREP 100 G PO SOLR: ORAL | Status: DC

## 2012-10-14 ENCOUNTER — Encounter: Payer: Self-pay | Admitting: Cardiology

## 2012-10-14 ENCOUNTER — Ambulatory Visit (INDEPENDENT_AMBULATORY_CARE_PROVIDER_SITE_OTHER): Payer: Medicare Other | Admitting: Cardiology

## 2012-10-14 VITALS — BP 100/60 | HR 79 | Ht 68.0 in | Wt 165.0 lb

## 2012-10-14 DIAGNOSIS — I340 Nonrheumatic mitral (valve) insufficiency: Secondary | ICD-10-CM

## 2012-10-14 DIAGNOSIS — I059 Rheumatic mitral valve disease, unspecified: Secondary | ICD-10-CM

## 2012-10-14 DIAGNOSIS — I6523 Occlusion and stenosis of bilateral carotid arteries: Secondary | ICD-10-CM

## 2012-10-14 DIAGNOSIS — I5040 Unspecified combined systolic (congestive) and diastolic (congestive) heart failure: Secondary | ICD-10-CM

## 2012-10-14 DIAGNOSIS — I679 Cerebrovascular disease, unspecified: Secondary | ICD-10-CM

## 2012-10-14 DIAGNOSIS — I6529 Occlusion and stenosis of unspecified carotid artery: Secondary | ICD-10-CM

## 2012-10-14 DIAGNOSIS — Z87891 Personal history of nicotine dependence: Secondary | ICD-10-CM

## 2012-10-14 DIAGNOSIS — I658 Occlusion and stenosis of other precerebral arteries: Secondary | ICD-10-CM

## 2012-10-14 DIAGNOSIS — I2510 Atherosclerotic heart disease of native coronary artery without angina pectoris: Secondary | ICD-10-CM

## 2012-10-14 NOTE — Progress Notes (Signed)
HPI The patient present for followup of his known coronary disease. In lead 2000 and swelling a stress perfusion study to followup his bypass grafting. This was a low risk study with a slightly reduced ejection fraction. Last December he was supposed to have carotid Dopplers to followup known stenosis and echocardiogram to evaluate mitral regurgitation. However, for some reason this didn't happen. He returns for routine followup. He's doing well. He tends a little gardening is active despite his amputations. The patient denies any new symptoms such as chest discomfort, neck or arm discomfort. There has been no new shortness of breath, PND or orthopnea. There have been no reported palpitations, presyncope or syncope.  Allergies  Allergen Reactions  . Codeine     hallucinations  . Ciprofloxacin Diarrhea  . Nsaids Other (See Comments)    GI upset    Current Outpatient Prescriptions  Medication Sig Dispense Refill  . aspirin 81 MG tablet Take 81 mg by mouth daily.        . Cholecalciferol (VITAMIN D PO) Take 1 tablet by mouth 2 (two) times daily.      Marland Kitchen ezetimibe-simvastatin (VYTORIN) 10-40 MG per tablet Take 1 tablet by mouth daily.  90 tablet  1  . gabapentin (NEURONTIN) 600 MG tablet Take 1 tablet (600 mg total) by mouth 3 (three) times daily.  270 tablet  0  . insulin glargine (LANTUS) 100 UNIT/ML injection 40 u sq at hs  10 mL  2  . insulin lispro (HUMALOG) 100 UNIT/ML injection Inject 0-20 Units into the skin as directed. Sliding scale      . levothyroxine (SYNTHROID, LEVOTHROID) 125 MCG tablet Take 125 mcg by mouth daily.        . meloxicam (MOBIC) 15 MG tablet Take 1 tablet (15 mg total) by mouth daily.  10 tablet  0  . MOVIPREP 100 G SOLR moviprep as directed. No substitutions  1 kit  0  . nortriptyline (PAMELOR) 25 MG capsule Take 1 capsule (25 mg total) by mouth at bedtime.  90 capsule  1  . omeprazole (PRILOSEC) 20 MG capsule Take 1 capsule (20 mg total) by mouth daily.  90  capsule  1  . ramipril (ALTACE) 2.5 MG capsule Take 1 capsule (2.5 mg total) by mouth daily.  90 capsule  1   No current facility-administered medications for this visit.    Past Medical History  Diagnosis Date  . CAD (coronary artery disease)     S/P CABG, Feb 2003, LIMA to the LAD, left free radial artery to an obtuse marginal, spahenous vein graft to diagonal, saphenous vein graft to RCA with endarterectomy  . S/P below knee amputation   . Cerebrovascular disease     MRA in 2005 with 75% stenosis in distal right vertebral artery and 75% stenosis greater in distal cervical internal carotid artery on teh right, severe bilateral disease and MRA of the extracranial circulation, high grade stenosis of the distal right internal carotid artery at the junction of the cervical internal carotid artery of the skull base  . Diabetes mellitus   . Hyperlipidemia   . Hypothyroidism   . History of tobacco use     Quit in 1990  . Myocardial infarction 2002  . GERD (gastroesophageal reflux disease)   . Arthritis     Past Surgical History  Procedure Laterality Date  . Leg amputation below knee      Bilateral  . Arterial bypass surgry      Left; left  femoral to posterior tibial bypass gracting, left femoral artery and deep femoral artery endarterectomy with vein patch angioplasty of the common femoral arter and deep femoral artery 06/2005, right fremoral popliteal bypass, right iliac artery restent, bilateral below knee amputations  . Thoracotomy      With drainage of a hemathroax and decortication of fibrothorax  . Coronary artery bypass graft  2002    By [name], MD  . Cataract extraction      Bilateral  . Carpal tunnel release      Right  . Amputation  10/17/2011    Procedure: AMPUTATION DIGIT;  Surgeon: [name], MD;  Location: [facility] OR;  Service: Orthopedics;  Laterality: Left;  Amputation Left Index Finger at PIP Joint    ROS:  As stated in the HPI and negative for all other  systems.  PHYSICAL EXAM BP 100/60  Pulse 79  Ht 5\' 8"  (1.727 m)  Wt 165 lb (74.844 kg)  BMI 25.09 kg/m2 GENERAL:  Well appearing HEENT:  Pupils equal round and reactive, fundi not visualized, oral mucosa unremarkable NECK:  No jugular venous distention, waveform within normal limits, carotid upstroke brisk and symmetric, right bruits, no thyromegaly LYMPHATICS:  No cervical, inguinal adenopathy LUNGS:  Clear to auscultation bilaterally BACK:  No CVA tenderness CHEST:  Well healed sternotomy scar. HEART:  PMI not displaced or sustained,S1 and S2 within normal limits, no S3, no S4, no clicks, no rubs, apical murmur radiating out the outflow tract and slightly to the axilla. ABD:  Flat, positive bowel sounds normal in frequency in pitch, no bruits, no rebound, no guarding, no midline pulsatile mass, no hepatomegaly, no splenomegaly EXT:  Absent radials and bilateral BKAs, no edema, no cyanosis no clubbing, femoral bruits  EKG:  Sinus rhythm, rate 79, axis within normal limits, INTERVALS WITHIN NORMAL LIMITS, lateral T-wave inversions consistent with ischemia but unchanged from previous. 10/14/2012  ASSESSMENT AND PLAN  CAD:  The patient has no new sypmtoms.  No further cardiovascular testing is indicated.  We will continue with aggressive risk reduction and meds as listed.  HYPERLIPIDEMIA:  His most recent LDL was 44 with an HDL of 34. He will continue the meds as listed.  CAROTID STENOSIS:  He has had a 60-79% right stenosis and 40-59% left and is overdue for followup. I will arrange this.  HTN:  The blood pressure is at target. No change in medications is indicated. We will continue with therapeutic lifestyle changes (TLC).  DIABETES:  Unfortunately his his A1c was 8.9. Dr. Christell Constant is aggressively trying to get this under control.  MR:  He is overdue for followup of moderate mitral regurgitation and I will schedule echocardiography.

## 2012-10-14 NOTE — Patient Instructions (Addendum)
The current medical regimen is effective;  continue present plan and medications.  Your physician has requested that you have a carotid duplex. This test is an ultrasound of the carotid arteries in your neck. It looks at blood flow through these arteries that supply the brain with blood. Allow one hour for this exam. There are no restrictions or special instructions.  Your physician has requested that you have an echocardiogram. Echocardiography is a painless test that uses sound waves to create images of your heart. It provides your doctor with information about the size and shape of your heart and how well your heart's chambers and valves are working. This procedure takes approximately one hour. There are no restrictions for this procedure.  Follow up in 1 year with Dr Antoine Poche.  You will receive a letter in the mail 2 months before you are due.  Please call us when you receive this letter to schedule your follow up appointment.

## 2012-10-16 ENCOUNTER — Encounter: Payer: Self-pay | Admitting: Family Medicine

## 2012-10-20 ENCOUNTER — Encounter (INDEPENDENT_AMBULATORY_CARE_PROVIDER_SITE_OTHER): Payer: Medicare Other

## 2012-10-20 ENCOUNTER — Ambulatory Visit (HOSPITAL_COMMUNITY): Payer: Medicare Other | Attending: Cardiology | Admitting: Radiology

## 2012-10-20 DIAGNOSIS — I6523 Occlusion and stenosis of bilateral carotid arteries: Secondary | ICD-10-CM

## 2012-10-20 DIAGNOSIS — I6529 Occlusion and stenosis of unspecified carotid artery: Secondary | ICD-10-CM

## 2012-10-20 DIAGNOSIS — I340 Nonrheumatic mitral (valve) insufficiency: Secondary | ICD-10-CM

## 2012-10-20 DIAGNOSIS — I359 Nonrheumatic aortic valve disorder, unspecified: Secondary | ICD-10-CM | POA: Insufficient documentation

## 2012-10-20 DIAGNOSIS — I059 Rheumatic mitral valve disease, unspecified: Secondary | ICD-10-CM

## 2012-10-20 NOTE — Progress Notes (Signed)
Echocardiogram performed.  

## 2012-10-21 ENCOUNTER — Encounter: Payer: BC Managed Care – PPO | Admitting: Gastroenterology

## 2012-11-11 ENCOUNTER — Encounter: Payer: Self-pay | Admitting: Gastroenterology

## 2012-11-11 ENCOUNTER — Ambulatory Visit (AMBULATORY_SURGERY_CENTER): Payer: Medicare Other | Admitting: Gastroenterology

## 2012-11-11 VITALS — BP 157/86 | HR 73 | Temp 97.5°F | Resp 10 | Ht 68.0 in | Wt 163.0 lb

## 2012-11-11 DIAGNOSIS — Z1211 Encounter for screening for malignant neoplasm of colon: Secondary | ICD-10-CM

## 2012-11-11 LAB — GLUCOSE, CAPILLARY: Glucose-Capillary: 197 mg/dL — ABNORMAL HIGH (ref 70–99)

## 2012-11-11 MED ORDER — SODIUM CHLORIDE 0.9 % IV SOLN: 500.0000 mL | INTRAVENOUS | Status: DC

## 2012-11-11 NOTE — Progress Notes (Signed)
Lidocaine-40mg IV prior to Propofol InductionPropofol given over incremental dosages 

## 2012-11-11 NOTE — Progress Notes (Signed)
Patient did not experience any of the following events: a burn prior to discharge; a fall within the facility; wrong site/side/patient/procedure/implant event; or a hospital transfer or hospital admission upon discharge from the facility. (G8907) Patient did not have preoperative order for IV antibiotic SSI prophylaxis. (G8918)  

## 2012-11-11 NOTE — Op Note (Signed)
[facility] [address] [place], [address]   COLONOSCOPY PROCEDURE REPORT  PATIENT: Christopher Padilla, Christopher Padilla  MR#: [id number] BIRTHDATE: 01-May-1941 , 71  yrs. old GENDER: Male ENDOSCOPIST: Mardella Layman, MD, [facility] REFERRED BY:  Rudi Heap, M.D. PROCEDURE DATE:  11/11/2012 PROCEDURE:   Colonoscopy, screening ASA CLASS:   Class III INDICATIONS: MEDICATIONS: propofol (Diprivan) 100mg  IV  DESCRIPTION OF PROCEDURE:   After the risks and benefits and of the procedure were explained, informed consent was obtained.  A digital rectal exam revealed no abnormalities of the rectum.    The [id number]  endoscope was introduced through the anus and advanced to the cecum, which was identified by both the appendix and ileocecal valve .  The quality of the prep was Moviprep fair . The instrument was then slowly withdrawn as the colon was fully examined.     COLON FINDINGS: A normal appearing cecum, ileocecal valve, and appendiceal orifice were identified.  The ascending, hepatic flexure, transverse, splenic flexure, descending, sigmoid colon and rectum appeared unremarkable.  No polyps or cancers were seen. Retroflexed views revealed no abnormalities. Prep adequate but not excellent.    The scope was then withdrawn from the patient and the procedure completed.  COMPLICATIONS: There were no complications. ENDOSCOPIC IMPRESSION: Normal colon ..no polyps noted...  RECOMMENDATIONS: 1.  Given your age, you will not need another colonoscopy for colon cancer screening or polyp surveillance.  These types of tests usually stop around the age 42. 2.  Continue current medications   REPEAT EXAM:  UU:[id number] Hochrein, MD  _______________________________ eSigned:  Mardella Layman, MD, [facility] 11/11/2012 9:48 AM

## 2012-11-11 NOTE — Patient Instructions (Addendum)
Discharge instructions given with verbal understanding. Normal exam. Resume previous medications. YOU HAD AN ENDOSCOPIC PROCEDURE TODAY AT THE [facility]: Refer to the procedure report that was given to you for any specific questions about what was found during the examination.  If the procedure report does not answer your questions, please call your gastroenterologist to clarify.  If you requested that your care partner not be given the details of your procedure findings, then the procedure report has been included in a sealed envelope for you to review at your convenience later.  YOU SHOULD EXPECT: Some feelings of bloating in the abdomen. Passage of more gas than usual.  Walking can help get rid of the air that was put into your GI tract during the procedure and reduce the bloating. If you had a lower endoscopy (such as a colonoscopy or flexible sigmoidoscopy) you may notice spotting of blood in your stool or on the toilet paper. If you underwent a bowel prep for your procedure, then you may not have a normal bowel movement for a few days.  DIET: Your first meal following the procedure should be a light meal and then it is ok to progress to your normal diet.  A half-sandwich or bowl of soup is an example of a good first meal.  Heavy or fried foods are harder to digest and may make you feel nauseous or bloated.  Likewise meals heavy in dairy and vegetables can cause extra gas to form and this can also increase the bloating.  Drink plenty of fluids but you should avoid alcoholic beverages for 24 hours.  ACTIVITY: Your care partner should take you home directly after the procedure.  You should plan to take it easy, moving slowly for the rest of the day.  You can resume normal activity the day after the procedure however you should NOT DRIVE or use heavy machinery for 24 hours (because of the sedation medicines used during the test).    SYMPTOMS TO REPORT IMMEDIATELY: A gastroenterologist  can be reached at any hour.  During normal business hours, 8:30 AM to 5:00 PM Monday through Friday, call [phone].  After hours and on weekends, please call the GI answering service at [phone] who will take a message and have the physician on call contact you.   Following lower endoscopy (colonoscopy or flexible sigmoidoscopy):  Excessive amounts of blood in the stool  Significant tenderness or worsening of abdominal pains  Swelling of the abdomen that is new, acute  Fever of 100F or higher  FOLLOW UP: If any biopsies were taken you will be contacted by phone or by letter within the next 1-3 weeks.  Call your gastroenterologist if you have not heard about the biopsies in 3 weeks.  Our staff will call the home number listed on your records the next business day following your procedure to check on you and address any questions or concerns that you may have at that time regarding the information given to you following your procedure. This is a courtesy call and so if there is no answer at the home number and we have not heard from you through the emergency physician on call, we will assume that you have returned to your regular daily activities without incident.  SIGNATURES/CONFIDENTIALITY: You and/or your care partner have signed paperwork which will be entered into your electronic medical record.  These signatures attest to the fact that that the information above on your After Visit Summary has been reviewed   and is understood.  Full responsibility of the confidentiality of this discharge information lies with you and/or your care-partner. 

## 2012-11-12 ENCOUNTER — Telehealth: Payer: Self-pay

## 2012-11-12 NOTE — Telephone Encounter (Signed)
Left message on answering machine. 

## 2012-12-02 ENCOUNTER — Other Ambulatory Visit: Payer: Self-pay | Admitting: Family Medicine

## 2012-12-02 MED ORDER — INSULIN LISPRO 100 UNIT/ML ~~LOC~~ SOLN: 0.0000 [IU] | SUBCUTANEOUS | Status: DC

## 2012-12-09 ENCOUNTER — Other Ambulatory Visit: Payer: Self-pay | Admitting: *Deleted

## 2012-12-09 MED ORDER — EZETIMIBE-SIMVASTATIN 10-40 MG PO TABS: 1.0000 | ORAL_TABLET | Freq: Every day | ORAL | Status: DC

## 2012-12-09 NOTE — Telephone Encounter (Signed)
Samples provided to patient.

## 2013-01-18 ENCOUNTER — Other Ambulatory Visit: Payer: Self-pay | Admitting: Family Medicine

## 2013-01-24 ENCOUNTER — Other Ambulatory Visit: Payer: Self-pay | Admitting: Family Medicine

## 2013-01-25 ENCOUNTER — Ambulatory Visit: Payer: Medicare Other | Admitting: Family Medicine

## 2013-01-26 ENCOUNTER — Ambulatory Visit (INDEPENDENT_AMBULATORY_CARE_PROVIDER_SITE_OTHER): Payer: Medicare Other | Admitting: Family Medicine

## 2013-01-26 ENCOUNTER — Encounter: Payer: Self-pay | Admitting: Family Medicine

## 2013-01-26 VITALS — BP 126/62 | HR 84 | Temp 96.0°F | Ht 68.0 in | Wt 164.0 lb

## 2013-01-26 DIAGNOSIS — E039 Hypothyroidism, unspecified: Secondary | ICD-10-CM

## 2013-01-26 DIAGNOSIS — I739 Peripheral vascular disease, unspecified: Secondary | ICD-10-CM

## 2013-01-26 DIAGNOSIS — E559 Vitamin D deficiency, unspecified: Secondary | ICD-10-CM

## 2013-01-26 DIAGNOSIS — Z23 Encounter for immunization: Secondary | ICD-10-CM

## 2013-01-26 DIAGNOSIS — E119 Type 2 diabetes mellitus without complications: Secondary | ICD-10-CM

## 2013-01-26 DIAGNOSIS — E785 Hyperlipidemia, unspecified: Secondary | ICD-10-CM

## 2013-01-26 NOTE — Patient Instructions (Addendum)
Continue current medications. Continue good therapeutic lifestyle changes.  Fall precautions discussed with patient. Schedule your flu vaccine the first of October. Follow up as planned and earlier as needed.   

## 2013-01-26 NOTE — Progress Notes (Signed)
Subjective:    Patient ID: Christopher Padilla, male    DOB: 1940-07-30, 72 y.o.   MRN: [id number]  HPI Pt here for follow up and management of chronic medical problems. Health maintenance parameters he is to his flu shot, his shingle shot, urine microalbumin and eye exam. Patient indicates that fasting blood sugars run anywhere from 60-220. In breakdown on his below the knee amputations on both legs. He refuses to have a prostate exam today. He has had a PSA this past morning.     Patient Active Problem List   Diagnosis Date Noted  . Mitral regurgitation 07/03/2011  . HYPOTHYROIDISM 11/05/2008  . DM 11/05/2008  . HYPERLIPIDEMIA 11/05/2008  . CEREBROVASCULAR DISEASE 11/05/2008  . TOBACCO ABUSE, HX OF 11/05/2008  . CAD 10/04/2008  . HEART FAILURE, COMBINED UNSPEC. 10/04/2008   Outpatient Encounter Prescriptions as of 01/26/2013  Medication Sig Dispense Refill  . aspirin 81 MG tablet Take 81 mg by mouth daily.        . Cholecalciferol (VITAMIN D PO) Take 1 tablet by mouth 2 (two) times daily.      Marland Kitchen ezetimibe-simvastatin (VYTORIN) 10-40 MG per tablet Take 1 tablet by mouth daily.  56 tablet  0  . gabapentin (NEURONTIN) 600 MG tablet TAKE 1 TABLET (600 MG TOTAL) BY MOUTH 3 (THREE) TIMES DAILY.  270 tablet  0  . insulin lispro (HUMALOG) 100 UNIT/ML injection Inject 0-20 Units into the skin as directed. Sliding scale  10 mL  1  . LANTUS 100 UNIT/ML injection INJECT 4O UNITS SUBCUTANEOUSLY AT BEDTIME  10 mL  1  . levothyroxine (SYNTHROID, LEVOTHROID) 125 MCG tablet Take 125 mcg by mouth daily.        . nortriptyline (PAMELOR) 25 MG capsule Take 1 capsule (25 mg total) by mouth at bedtime.  90 capsule  1  . omeprazole (PRILOSEC) 20 MG capsule Take 1 capsule (20 mg total) by mouth daily.  90 capsule  1  . ramipril (ALTACE) 2.5 MG capsule TAKE 1 CAPSULE BY MOUTH DAILY  90 capsule  1  . [DISCONTINUED] meloxicam (MOBIC) 15 MG tablet Take 1 tablet (15 mg total) by mouth daily.  10 tablet  0   No  facility-administered encounter medications on file as of 01/26/2013.       Review of Systems  Constitutional: Negative.   HENT: Negative.   Eyes: Negative.   Respiratory: Negative.   Cardiovascular: Negative.   Gastrointestinal: Negative.   Endocrine: Negative.   Genitourinary: Negative.   Musculoskeletal: Negative.   Skin: Positive for wound (left stump pain).  Allergic/Immunologic: Negative.   Neurological: Negative.   Hematological: Negative.   Psychiatric/Behavioral: Negative.        Objective:   Physical Exam  Nursing note and vitals reviewed. Constitutional: He is oriented to person, place, and time. He appears well-developed and well-nourished. No distress.  He is a bilateral below the knee amputee because of his diabetes  HENT:  Head: Normocephalic and atraumatic.  Right Ear: External ear normal.  Left Ear: External ear normal.  Nose: Nose normal.  Mouth/Throat: Oropharynx is clear and moist. No oropharyngeal exudate.  Eyes: Conjunctivae and EOM are normal. Right eye exhibits no discharge. Left eye exhibits no discharge. No scleral icterus.  Neck: Normal range of motion. Neck supple. No thyromegaly present.  Cardiovascular: Normal rate, regular rhythm and normal heart sounds.   No murmur heard. At 84 per minute He has bilateral supraclavicular bruits. His left radial pulse was not palpable. His  right radial pulse was barely palpable. His left femoral pulse was difficult to palpate. His right femoral pulse was easier to palpate.  Pulmonary/Chest: Effort normal and breath sounds normal. No respiratory distress. He has no wheezes. He has no rales.  Abdominal: Soft. Bowel sounds are normal. He exhibits no mass. There is no tenderness. There is no rebound and no guarding.  Genitourinary:  Patient refuses rectal exam and prostate exam  Musculoskeletal: Normal range of motion. He exhibits no edema and no tenderness.  Patient is a bilateral below the knee amputee and  has prostheses for both lower extremity There was breakdown of skin on the left stump  Lymphadenopathy:    He has no cervical adenopathy.  Neurological: He is alert and oriented to person, place, and time.  Skin: Skin is warm. No rash noted. No erythema. No pallor.  Psychiatric: He has a normal mood and affect. His behavior is normal. Judgment and thought content normal.   BP 126/62  Pulse 84  Temp(Src) 96 F (35.6 C) (Oral)  Ht 5\' 8"  (1.727 m)  Wt 164 lb (74.39 kg)  BMI 24.94 kg/m2         Assessment & Plan:   1. HYPOTHYROIDISM   2. 250.72   3. HYPERLIPIDEMIA   4. Vitamin D deficiency   5. Peripheral vascular insufficiency, bilateral supraclavicular bruits    Orders Placed This Encounter  Procedures  . NMR, lipoprofile    Standing Status: Future     Number of Occurrences:      Standing Expiration Date: 01/26/2014  . Hepatic function panel    Standing Status: Future     Number of Occurrences:      Standing Expiration Date: 01/26/2014  . BMP8+EGFR    Standing Status: Future     Number of Occurrences:      Standing Expiration Date: 01/26/2014  . Vit D  25 hydroxy (rtn osteoporosis monitoring)    Standing Status: Future     Number of Occurrences:      Standing Expiration Date: 01/26/2014  . Thyroid Panel With TSH    Standing Status: Future     Number of Occurrences:      Standing Expiration Date: 01/26/2014  . POCT CBC    Standing Status: Future     Number of Occurrences:      Standing Expiration Date: 02/25/2013  . POCT glycosylated hemoglobin (Hb A1C)    Standing Status: Future     Number of Occurrences:      Standing Expiration Date: 02/25/2013   Patient Instructions  Continue current medications. Continue good therapeutic lifestyle changes.  Fall precautions discussed with patient.  Schedule your flu vaccine the first of October.  Follow up as planned and earlier as needed.     Bring back FOBT Needs eye exam Follow up closely with orthopedist  regarding stump care    Nyra Capes MD

## 2013-01-27 ENCOUNTER — Other Ambulatory Visit (INDEPENDENT_AMBULATORY_CARE_PROVIDER_SITE_OTHER): Payer: Medicare Other

## 2013-01-27 DIAGNOSIS — E119 Type 2 diabetes mellitus without complications: Secondary | ICD-10-CM

## 2013-01-27 DIAGNOSIS — E039 Hypothyroidism, unspecified: Secondary | ICD-10-CM

## 2013-01-27 DIAGNOSIS — E785 Hyperlipidemia, unspecified: Secondary | ICD-10-CM

## 2013-01-27 DIAGNOSIS — E559 Vitamin D deficiency, unspecified: Secondary | ICD-10-CM

## 2013-01-27 LAB — POCT CBC
Granulocyte percent: 63.8 %G (ref 37–80)
MPV: 8.7 fL (ref 0–99.8)
POC Granulocyte: 4.3 (ref 2–6.9)
POC LYMPH PERCENT: 30.1 %L (ref 10–50)
Platelet Count, POC: 174 10*3/uL (ref 142–424)
RBC: 4.9 M/uL (ref 4.69–6.13)
RDW, POC: 13.6 %

## 2013-01-27 NOTE — Progress Notes (Signed)
Patient came in for labs only.

## 2013-01-29 LAB — NMR, LIPOPROFILE
Cholesterol: 95 mg/dL (ref <200)
LDL Particle Number: 794 nmol/L (ref <1000)
LDL Size: 20.6 nm (ref >20.5)
LDLC SERPL CALC-MCNC: 39 mg/dL (ref <100)
Triglycerides by NMR: 65 mg/dL (ref <150)

## 2013-01-29 LAB — BMP8+EGFR
BUN/Creatinine Ratio: 18 (ref 10–22)
CO2: 29 mmol/L (ref 18–29)
Calcium: 9.5 mg/dL (ref 8.6–10.2)
Chloride: 97 mmol/L (ref 97–108)
Creatinine, Ser: 1.41 mg/dL — ABNORMAL HIGH (ref 0.76–1.27)
Glucose: 138 mg/dL — ABNORMAL HIGH (ref 65–99)
Potassium: 5.2 mmol/L (ref 3.5–5.2)
Sodium: 139 mmol/L (ref 134–144)

## 2013-01-29 LAB — THYROID PANEL WITH TSH
Free Thyroxine Index: 2.8 (ref 1.2–4.9)
T4, Total: 8.4 ug/dL (ref 4.5–12.0)
TSH: 0.665 u[IU]/mL (ref 0.450–4.500)

## 2013-01-29 LAB — HEPATIC FUNCTION PANEL
ALT: 18 IU/L (ref 0–44)
AST: 18 IU/L (ref 0–40)
Bilirubin, Direct: 0.21 mg/dL (ref 0.00–0.40)
Total Bilirubin: 0.5 mg/dL (ref 0.0–1.2)

## 2013-01-29 LAB — VITAMIN D 25 HYDROXY (VIT D DEFICIENCY, FRACTURES): Vit D, 25-Hydroxy: 33 ng/mL (ref 30.0–100.0)

## 2013-01-31 ENCOUNTER — Other Ambulatory Visit: Payer: Self-pay | Admitting: Family Medicine

## 2013-02-03 ENCOUNTER — Other Ambulatory Visit: Payer: Self-pay | Admitting: Family Medicine

## 2013-02-06 ENCOUNTER — Other Ambulatory Visit: Payer: Self-pay | Admitting: Family Medicine

## 2013-05-03 ENCOUNTER — Encounter: Payer: Self-pay | Admitting: Family Medicine

## 2013-05-03 ENCOUNTER — Ambulatory Visit (INDEPENDENT_AMBULATORY_CARE_PROVIDER_SITE_OTHER): Payer: Medicare Other | Admitting: Family Medicine

## 2013-05-03 VITALS — BP 111/57 | HR 90 | Temp 98.0°F | Ht 68.0 in | Wt 159.0 lb

## 2013-05-03 DIAGNOSIS — Z89511 Acquired absence of right leg below knee: Secondary | ICD-10-CM

## 2013-05-03 DIAGNOSIS — E559 Vitamin D deficiency, unspecified: Secondary | ICD-10-CM

## 2013-05-03 DIAGNOSIS — S88119A Complete traumatic amputation at level between knee and ankle, unspecified lower leg, initial encounter: Secondary | ICD-10-CM

## 2013-05-03 DIAGNOSIS — Z89519 Acquired absence of unspecified leg below knee: Secondary | ICD-10-CM | POA: Insufficient documentation

## 2013-05-03 DIAGNOSIS — I2510 Atherosclerotic heart disease of native coronary artery without angina pectoris: Secondary | ICD-10-CM

## 2013-05-03 DIAGNOSIS — E119 Type 2 diabetes mellitus without complications: Secondary | ICD-10-CM

## 2013-05-03 DIAGNOSIS — E039 Hypothyroidism, unspecified: Secondary | ICD-10-CM

## 2013-05-03 DIAGNOSIS — Z23 Encounter for immunization: Secondary | ICD-10-CM

## 2013-05-03 DIAGNOSIS — E785 Hyperlipidemia, unspecified: Secondary | ICD-10-CM

## 2013-05-03 MED ORDER — GABAPENTIN 600 MG PO TABS: ORAL_TABLET | ORAL | Status: DC

## 2013-05-03 MED ORDER — OMEPRAZOLE 20 MG PO CPDR: DELAYED_RELEASE_CAPSULE | ORAL | Status: DC

## 2013-05-03 MED ORDER — INSULIN LISPRO 100 UNIT/ML ~~LOC~~ SOLN: SUBCUTANEOUS | Status: DC

## 2013-05-03 MED ORDER — RAMIPRIL 2.5 MG PO CAPS: ORAL_CAPSULE | ORAL | Status: DC

## 2013-05-03 MED ORDER — EZETIMIBE-SIMVASTATIN 10-40 MG PO TABS: 1.0000 | ORAL_TABLET | Freq: Every day | ORAL | Status: DC

## 2013-05-03 MED ORDER — LEVOTHYROXINE SODIUM 125 MCG PO TABS: 125.0000 ug | ORAL_TABLET | Freq: Every day | ORAL | Status: DC

## 2013-05-03 MED ORDER — NORTRIPTYLINE HCL 25 MG PO CAPS: ORAL_CAPSULE | ORAL | Status: DC

## 2013-05-03 MED ORDER — INSULIN GLARGINE 100 UNIT/ML ~~LOC~~ SOLN: SUBCUTANEOUS | Status: DC

## 2013-05-03 NOTE — Addendum Note (Signed)
Addended by: Magdalene River on: 05/03/2013 06:49 PM   Modules accepted: Orders

## 2013-05-03 NOTE — Progress Notes (Signed)
Subjective:    Patient ID: Christopher Padilla, male    DOB: 09-08-1940, 72 y.o.   MRN: [id number]  HPI Pt here for follow up and management of chronic medical problems. He complains of a sore on the right stump. Health maintenance issues he is due for lab work, FOBT, and Prevnar. He is also in need of an eye exam.        Patient Active Problem List   Diagnosis Date Noted  . Mitral regurgitation 07/03/2011  . HYPOTHYROIDISM 11/05/2008  . Insulin-dependent diabetes mellitus,250.72 11/05/2008  . HYPERLIPIDEMIA 11/05/2008  . CEREBROVASCULAR DISEASE 11/05/2008  . TOBACCO ABUSE, HX OF 11/05/2008  . CAD 10/04/2008  . HEART FAILURE, COMBINED UNSPEC. 10/04/2008   Outpatient Encounter Prescriptions as of 05/03/2013  Medication Sig  . aspirin 81 MG tablet Take 81 mg by mouth daily.    . Cholecalciferol (VITAMIN D PO) Take 1 tablet by mouth 2 (two) times daily.  Marland Kitchen ezetimibe-simvastatin (VYTORIN) 10-40 MG per tablet Take 1 tablet by mouth daily.  Marland Kitchen gabapentin (NEURONTIN) 600 MG tablet TAKE 1 TABLET (600 MG TOTAL) BY MOUTH 3 (THREE) TIMES DAILY.  Marland Kitchen HUMALOG 100 UNIT/ML injection INJECT 0-20 UNITS INTO THE SKIN AS DIRECTED. SLIDING SCALE  . LANTUS 100 UNIT/ML injection INJECT 4O UNITS SUBCUTANEOUSLY AT BEDTIME  . levothyroxine (SYNTHROID, LEVOTHROID) 125 MCG tablet Take 125 mcg by mouth daily.    . nortriptyline (PAMELOR) 25 MG capsule TAKE 1 CAPSULE BY MOUTH AT BEDTIME  . omeprazole (PRILOSEC) 20 MG capsule TAKE 1 CAPSULE BY MOUTH DAILY  . ramipril (ALTACE) 2.5 MG capsule TAKE 1 CAPSULE BY MOUTH DAILY    Review of Systems  Constitutional: Negative.   HENT: Negative.   Eyes: Negative.   Respiratory: Negative.   Cardiovascular: Negative.   Gastrointestinal: Negative.   Endocrine: Negative.   Genitourinary: Negative.   Musculoskeletal: Negative.        Sore on right leg  Skin: Negative.   Allergic/Immunologic: Negative.   Neurological: Negative.   Hematological: Negative.     Psychiatric/Behavioral: Negative.        Objective:   Physical Exam  Nursing note and vitals reviewed. Constitutional: He is oriented to person, place, and time. He appears well-developed and well-nourished. No distress.  HENT:  Head: Normocephalic and atraumatic.  Right Ear: External ear normal.  Left Ear: External ear normal.  Nose: Nose normal.  Mouth/Throat: Oropharynx is clear and moist. No oropharyngeal exudate.  Eyes: Conjunctivae and EOM are normal. Pupils are equal, round, and reactive to light. Right eye exhibits no discharge. Left eye exhibits no discharge. No scleral icterus.  Neck: Normal range of motion. Neck supple. No thyromegaly present.  Cardiovascular: Normal rate, regular rhythm, normal heart sounds and intact distal pulses.  Exam reveals no gallop and no friction rub.   No murmur heard. At 72 per minute  Pulmonary/Chest: Effort normal and breath sounds normal. No respiratory distress. He has no wheezes. He has no rales. He exhibits no tenderness.  Patient has bilateral supra-clavicular bruit  Abdominal: Soft. Bowel sounds are normal. He exhibits no mass. There is no tenderness. There is no rebound and no guarding.  Musculoskeletal: Normal range of motion. He exhibits no edema and no tenderness.  He is a below the knee amputee bilaterally.  he has a callus on his right stump, there is no redness around the callus  Lymphadenopathy:    He has no cervical adenopathy.  Neurological: He is alert and oriented to person, place, and  time. No cranial nerve deficit.  Skin: Skin is warm and dry. No rash noted. No erythema. No pallor.  Psychiatric: He has a normal mood and affect. His behavior is normal. Judgment and thought content normal.   BP 111/57  Pulse 90  Temp(Src) 98 F (36.7 C) (Oral)  Ht 5\' 8"  (1.727 m)  Wt 159 lb (72.122 kg)  BMI 24.18 kg/m2        Assessment & Plan:  1. CAD - POCT CBC; Future  2. HYPERLIPIDEMIA - BMP8+EGFR; Future - Hepatic  function panel; Future - POCT CBC; Future - NMR, lipoprofile; Future  3. HYPOTHYROIDISM - POCT CBC; Future  4. Insulin-dependent diabetes mellitus,250.72 - POCT UA - Microalbumin - BMP8+EGFR; Future - POCT CBC; Future - POCT glycosylated hemoglobin (Hb A1C); Future  5. Vitamin D deficiency - Vit D  25 hydroxy (rtn osteoporosis monitoring); Future  6. Hx of BKA, bilateral  Orders Placed This Encounter  Procedures  . BMP8+EGFR    Standing Status: Future     Number of Occurrences:      Standing Expiration Date: 05/03/2014  . Hepatic function panel    Standing Status: Future     Number of Occurrences:      Standing Expiration Date: 05/03/2014  . Vit D  25 hydroxy (rtn osteoporosis monitoring)    Standing Status: Future     Number of Occurrences:      Standing Expiration Date: 05/03/2014  . NMR, lipoprofile    Standing Status: Future     Number of Occurrences:      Standing Expiration Date: 05/03/2014  . POCT UA - Microalbumin  . POCT CBC    Standing Status: Future     Number of Occurrences:      Standing Expiration Date: 06/03/2013  . POCT glycosylated hemoglobin (Hb A1C)    Standing Status: Future     Number of Occurrences:      Standing Expiration Date: 06/03/2013   Meds ordered this encounter  Medications  . ramipril (ALTACE) 2.5 MG capsule    Sig: TAKE 1 CAPSULE BY MOUTH DAILY    Dispense:  90 capsule    Refill:  3  . omeprazole (PRILOSEC) 20 MG capsule    Sig: TAKE 1 CAPSULE BY MOUTH DAILY    Dispense:  90 capsule    Refill:  3  . nortriptyline (PAMELOR) 25 MG capsule    Sig: TAKE 1 CAPSULE BY MOUTH AT BEDTIME    Dispense:  90 capsule    Refill:  3  . levothyroxine (SYNTHROID, LEVOTHROID) 125 MCG tablet    Sig: Take 1 tablet (125 mcg total) by mouth daily.    Dispense:  90 tablet    Refill:  3  . insulin glargine (LANTUS) 100 UNIT/ML injection    Sig: INJECT 4O UNITS SUBCUTANEOUSLY AT BEDTIME    Dispense:  30 mL    Refill:  3  . insulin lispro  (HUMALOG) 100 UNIT/ML injection    Sig: INJECT 0-20 UNITS INTO THE SKIN AS DIRECTED. SLIDING SCALE    Dispense:  30 mL    Refill:  3  . gabapentin (NEURONTIN) 600 MG tablet    Sig: TAKE 1 TABLET (600 MG TOTAL) BY MOUTH 3 (THREE) TIMES DAILY.    Dispense:  270 tablet    Refill:  0  . ezetimibe-simvastatin (VYTORIN) 10-40 MG per tablet    Sig: Take 1 tablet by mouth daily.    Dispense:  90 tablet    Refill:  3   Patient Instructions  Continue current medications. Continue good therapeutic lifestyle changes which include good diet and exercise. Fall precautions discussed with patient. Schedule your flu vaccine if you haven't had it yet If you are over 67 years old - you may need Prevnar 13 or the adult Pneumonia vaccine. Continue to follow regularly with the orthopedist Try to drink plenty of fluids to keep herself well hydrated   Nyra Capes MD

## 2013-05-03 NOTE — Patient Instructions (Addendum)
Continue current medications. Continue good therapeutic lifestyle changes which include good diet and exercise. Fall precautions discussed with patient. Schedule your flu vaccine if you haven't had it yet If you are over 74 years old - you may need Prevnar 13 or the adult Pneumonia vaccine. Continue to follow regularly with the orthopedist Try to drink plenty of fluids to keep herself well hydrated

## 2013-05-04 ENCOUNTER — Other Ambulatory Visit: Payer: Self-pay | Admitting: Family Medicine

## 2013-05-19 ENCOUNTER — Other Ambulatory Visit: Payer: Self-pay | Admitting: Family Medicine

## 2013-05-31 ENCOUNTER — Other Ambulatory Visit (INDEPENDENT_AMBULATORY_CARE_PROVIDER_SITE_OTHER): Payer: Medicare HMO

## 2013-05-31 DIAGNOSIS — E039 Hypothyroidism, unspecified: Secondary | ICD-10-CM

## 2013-05-31 DIAGNOSIS — I2510 Atherosclerotic heart disease of native coronary artery without angina pectoris: Secondary | ICD-10-CM

## 2013-05-31 DIAGNOSIS — E559 Vitamin D deficiency, unspecified: Secondary | ICD-10-CM

## 2013-05-31 DIAGNOSIS — E119 Type 2 diabetes mellitus without complications: Secondary | ICD-10-CM

## 2013-05-31 DIAGNOSIS — E785 Hyperlipidemia, unspecified: Secondary | ICD-10-CM

## 2013-05-31 LAB — POCT CBC
GRANULOCYTE PERCENT: 64.1 % (ref 37–80)
HEMATOCRIT: 42.9 % — AB (ref 43.5–53.7)
HEMOGLOBIN: 13.6 g/dL — AB (ref 14.1–18.1)
Lymph, poc: 1.9 (ref 0.6–3.4)
MCH, POC: 28.6 pg (ref 27–31.2)
MCHC: 31.7 g/dL — AB (ref 31.8–35.4)
MCV: 90.4 fL (ref 80–97)
MPV: 8.5 fL (ref 0–99.8)
POC Granulocyte: 4.3 (ref 2–6.9)
POC LYMPH PERCENT: 28.4 %L (ref 10–50)
Platelet Count, POC: 212 10*3/uL (ref 142–424)
RBC: 4.7 M/uL (ref 4.69–6.13)
RDW, POC: 14.2 %
WBC: 6.7 10*3/uL (ref 4.6–10.2)

## 2013-05-31 LAB — POCT GLYCOSYLATED HEMOGLOBIN (HGB A1C): HEMOGLOBIN A1C: 9.4

## 2013-05-31 NOTE — Progress Notes (Signed)
Pt came in for labs only 

## 2013-06-02 LAB — BMP8+EGFR
BUN/Creatinine Ratio: 14 (ref 10–22)
BUN: 19 mg/dL (ref 8–27)
CALCIUM: 9.4 mg/dL (ref 8.6–10.2)
CO2: 25 mmol/L (ref 18–29)
CREATININE: 1.36 mg/dL — AB (ref 0.76–1.27)
Chloride: 99 mmol/L (ref 97–108)
GFR, EST AFRICAN AMERICAN: 59 mL/min/{1.73_m2} — AB (ref >59)
GFR, EST NON AFRICAN AMERICAN: 51 mL/min/{1.73_m2} — AB (ref >59)
Glucose: 110 mg/dL — ABNORMAL HIGH (ref 65–99)
Potassium: 4.7 mmol/L (ref 3.5–5.2)
SODIUM: 140 mmol/L (ref 134–144)

## 2013-06-02 LAB — HEPATIC FUNCTION PANEL
ALT: 13 IU/L (ref 0–44)
AST: 17 IU/L (ref 0–40)
Albumin: 3.9 g/dL (ref 3.5–4.8)
Alkaline Phosphatase: 118 IU/L — ABNORMAL HIGH (ref 39–117)
BILIRUBIN DIRECT: 0.12 mg/dL (ref 0.00–0.40)
BILIRUBIN TOTAL: 0.4 mg/dL (ref 0.0–1.2)
Total Protein: 6.3 g/dL (ref 6.0–8.5)

## 2013-06-02 LAB — NMR, LIPOPROFILE
Cholesterol: 173 mg/dL (ref <200)
HDL Cholesterol by NMR: 46 mg/dL (ref >=40)
HDL Particle Number: 29.1 umol/L — ABNORMAL LOW (ref >=30.5)
LDL PARTICLE NUMBER: 1076 nmol/L — AB (ref <1000)
LDL SIZE: 20.7 nm (ref >20.5)
LDLC SERPL CALC-MCNC: 107 mg/dL — AB (ref <100)
LP-IR Score: 37 (ref <=45)
Small LDL Particle Number: 468 nmol/L (ref <=527)
Triglycerides by NMR: 102 mg/dL (ref <150)

## 2013-06-02 LAB — VITAMIN D 25 HYDROXY (VIT D DEFICIENCY, FRACTURES): Vit D, 25-Hydroxy: 26.6 ng/mL — ABNORMAL LOW (ref 30.0–100.0)

## 2013-06-15 ENCOUNTER — Other Ambulatory Visit: Payer: Self-pay | Admitting: Family Medicine

## 2013-07-13 ENCOUNTER — Other Ambulatory Visit: Payer: Self-pay | Admitting: Family Medicine

## 2013-08-09 ENCOUNTER — Other Ambulatory Visit: Payer: Self-pay | Admitting: Family Medicine

## 2013-08-10 ENCOUNTER — Telehealth: Payer: Self-pay | Admitting: Family Medicine

## 2013-08-17 ENCOUNTER — Other Ambulatory Visit: Payer: Self-pay | Admitting: Family Medicine

## 2013-08-18 ENCOUNTER — Other Ambulatory Visit: Payer: Self-pay | Admitting: Family Medicine

## 2013-08-30 ENCOUNTER — Ambulatory Visit (INDEPENDENT_AMBULATORY_CARE_PROVIDER_SITE_OTHER): Payer: Medicare HMO | Admitting: Family Medicine

## 2013-08-30 ENCOUNTER — Ambulatory Visit (INDEPENDENT_AMBULATORY_CARE_PROVIDER_SITE_OTHER): Payer: Medicare HMO

## 2013-08-30 ENCOUNTER — Encounter: Payer: Self-pay | Admitting: Family Medicine

## 2013-08-30 VITALS — BP 112/56 | HR 80 | Temp 97.7°F | Ht 68.0 in | Wt 160.0 lb

## 2013-08-30 DIAGNOSIS — I2510 Atherosclerotic heart disease of native coronary artery without angina pectoris: Secondary | ICD-10-CM

## 2013-08-30 DIAGNOSIS — Z2911 Encounter for prophylactic immunotherapy for respiratory syncytial virus (RSV): Secondary | ICD-10-CM

## 2013-08-30 DIAGNOSIS — E559 Vitamin D deficiency, unspecified: Secondary | ICD-10-CM

## 2013-08-30 DIAGNOSIS — E785 Hyperlipidemia, unspecified: Secondary | ICD-10-CM

## 2013-08-30 DIAGNOSIS — E119 Type 2 diabetes mellitus without complications: Secondary | ICD-10-CM

## 2013-08-30 DIAGNOSIS — F411 Generalized anxiety disorder: Secondary | ICD-10-CM

## 2013-08-30 DIAGNOSIS — E039 Hypothyroidism, unspecified: Secondary | ICD-10-CM

## 2013-08-30 NOTE — Progress Notes (Addendum)
Subjective:    Patient ID: Christopher Padilla, male    DOB: [birth year], 73 y.o.   MRN: [id number]  HPI Pt here for follow up and management of chronic medical problems. The patient continues to be bothered by his prosthesis. He has some FOBT at home which he needs to return. He will get a chest x-ray today. He will also return tomorrow for fasting lab work. He is behind on his eye exams and he needs to schedule this. 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          Patient Active Problem List   Diagnosis Date Noted  . Hx of BKA, bilateral 05/03/2013  . Mitral regurgitation 07/03/2011  . HYPOTHYROIDISM 11/05/2008  . Insulin-dependent diabetes mellitus,250.72 11/05/2008  . HYPERLIPIDEMIA 11/05/2008  . CEREBROVASCULAR DISEASE 11/05/2008  . TOBACCO ABUSE, HX OF 11/05/2008  . CAD 10/04/2008  . HEART FAILURE, COMBINED UNSPEC. 10/04/2008   Outpatient Encounter Prescriptions as of 08/30/2013  Medication Sig  . aspirin 81 MG tablet Take 81 mg by mouth daily.    . Cholecalciferol (VITAMIN D PO) Take 1 tablet by mouth 2 (two) times daily.  Marland Kitchen ezetimibe-simvastatin (VYTORIN) 10-40 MG per tablet Take 1 tablet by mouth daily.  Marland Kitchen gabapentin (NEURONTIN) 600 MG tablet TAKE 1 TABLET (600 MG TOTAL) BY MOUTH 3 (THREE) TIMES DAILY.  Marland Kitchen HUMALOG 100 UNIT/ML injection INJECT 0-20 UNITS INTO THE SKIN AS DIRECTED. SLIDING SCALE  . insulin glargine (LANTUS) 100 UNIT/ML injection INJECT 4O UNITS SUBCUTANEOUSLY AT BEDTIME  . levothyroxine (SYNTHROID, LEVOTHROID) 125 MCG tablet TAKE 1 TABLET BY MOUTH DAILY  . nortriptyline (PAMELOR) 25 MG capsule TAKE 1 CAPSULE BY MOUTH AT BEDTIME  . omeprazole (PRILOSEC) 20 MG capsule TAKE 1 CAPSULE BY MOUTH DAILY  . ONE TOUCH ULTRA TEST test strip USE TO TEST SUGAR 4 TIMES DAILY  . ramipril (ALTACE) 2.5 MG capsule TAKE 1 CAPSULE BY MOUTH DAILY  . [DISCONTINUED] HUMALOG 100 UNIT/ML injection INJECT 0-20 UNITS INTO THE SKIN AS  DIRECTED. SLIDING SCALE  . [DISCONTINUED] insulin lispro (HUMALOG) 100 UNIT/ML injection INJECT 0-20 UNITS INTO THE SKIN AS DIRECTED. SLIDING SCALE  . [DISCONTINUED] LANTUS 100 UNIT/ML injection INJECT 4O UNITS SUBCUTANEOUSLY AT BEDTIME    Review of Systems  Constitutional: Negative.   HENT: Negative.   Eyes: Negative.   Respiratory: Negative.   Cardiovascular: Negative.   Gastrointestinal: Negative.   Endocrine: Negative.   Genitourinary: Negative.   Musculoskeletal: Negative.        Prosthesis- bothering him  Skin: Negative.   Allergic/Immunologic: Negative.   Neurological: Negative.   Hematological: Negative.   Psychiatric/Behavioral: Negative.        Objective:   Physical Exam  Nursing note and vitals reviewed. Constitutional: He is oriented to person, place, and time. He appears well-developed and well-nourished. No distress.  HENT:  Head: Normocephalic and atraumatic.  Right Ear: External ear normal.  Left Ear: External ear normal.  Mouth/Throat: Oropharynx is clear and moist. No oropharyngeal exudate.  Slight nasal congestion bilaterally  Eyes: Conjunctivae and EOM are normal. Pupils are equal, round, and reactive to light. Right eye exhibits no discharge. Left eye exhibits no discharge. No scleral icterus.  Neck: Normal range of motion. Neck supple. No thyromegaly present.  The patient has bilateral supraclavicular bruits  Cardiovascular: Normal rate, regular rhythm and normal heart sounds.  Exam reveals no gallop and no friction rub.   No murmur heard.  At 84 per minute, the  patient has diminished left radial pulse compared to the right, he is a bilateral below the knee amputee secondary to diabetes and vascular insufficiency  Pulmonary/Chest: Effort normal and breath sounds normal. No respiratory distress. He has no wheezes. He has no rales.  Abdominal: Soft. Bowel sounds are normal. He exhibits no mass. There is no tenderness. There is no rebound and no guarding.    Musculoskeletal: Normal range of motion. He exhibits no edema and no tenderness.  The patient is a bilateral below the knee amputee. He has a slight pressure sore on the right distal extremity  Lymphadenopathy:    He has no cervical adenopathy.  Neurological: He is alert and oriented to person, place, and time.  Skin: Skin is warm and dry. No rash noted. There is erythema. No pallor.  Minimal erythema pressure sore right distal extremity  Psychiatric: He has a normal mood and affect. His behavior is normal. Judgment and thought content normal.  Very anxious today and somewhat upset. 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   BP 112/56  Pulse 80  Temp(Src) 97.7 F (36.5 C) (Oral)  Ht 5' 8"  (1.727 m)  Wt 160 lb (72.576 kg)  BMI 24.33 kg/m2  [facility] reading (PRIMARY) by  Dr. Brunilda Payor x-ray- no active disease, heart  minimal enlargement                                  Assessment & Plan:  1. HYPERLIPIDEMIA - POCT CBC; Future - NMR, lipoprofile; Future - DG Chest 2 View; Future  2. HYPOTHYROIDISM - POCT CBC; Future - Thyroid Panel With TSH; Future  3. CAD - POCT CBC; Future - BMP8+EGFR; Future - Hepatic function panel; Future - DG Chest 2 View; Future  4. Insulin-dependent diabetes mellitus,250.72 - POCT CBC; Future - POCT glycosylated hemoglobin (Hb A1C); Future - BMP8+EGFR; Future  5. Vitamin D deficiency - Vit D  25 hydroxy (rtn osteoporosis monitoring); Future  6. Anxiety state, unspecified  Patient Instructions                       Medicare Annual Wellness Visit  [facility] [place] and the medical providers at [facility] strive to bring you the best medical care.  In doing so we not only want to address your current medical conditions and concerns but also to detect new conditions early and prevent illness, disease and health-related problems.    Medicare offers a  yearly Wellness Visit which allows our clinical staff to assess your need for preventative services including immunizations, lifestyle education, counseling to decrease risk of preventable diseases and screening for fall risk and other medical concerns.    This visit is provided free of charge (no copay) for all Medicare recipients. The clinical pharmacists at [facility] have begun to conduct these Wellness Visits which will also include a thorough review of all your medications.    As you primary medical provider recommend that you make an appointment for your Annual Wellness Visit if you have not done so already this year.  You may set up this appointment before you leave today or you may call back ([phone]) and schedule an appointment.  Please make sure when you call that you mention that you are scheduling your Annual Wellness Visit with the clinical pharmacist so that  the appointment may be made for the proper length of time.       Continue current medications. Continue good therapeutic lifestyle changes which include good diet and exercise. Fall precautions discussed with patient. If an FOBT was given today- please return it to our front desk. If you are over 12 years old - you may need Prevnar 27 or the adult Pneumonia vaccine.  Continue to monitor her blood sugars regularly, return to clinic tomorrow for lab work and during the FOBT Please schedule a visit for your eye exam as soon as possible    Arrie Senate MD

## 2013-08-30 NOTE — Addendum Note (Signed)
Addended by: Magdalene River on: 08/30/2013 12:27 PM   Modules accepted: Orders

## 2013-08-30 NOTE — Patient Instructions (Addendum)
Medicare Annual Wellness Visit  [place] and the medical providers at [facility] strive to bring you the best medical care.  In doing so we not only want to address your current medical conditions and concerns but also to detect new conditions early and prevent illness, disease and health-related problems.    Medicare offers a yearly Wellness Visit which allows our clinical staff to assess your need for preventative services including immunizations, lifestyle education, counseling to decrease risk of preventable diseases and screening for fall risk and other medical concerns.    This visit is provided free of charge (no copay) for all Medicare recipients. The clinical pharmacists at [facility] have begun to conduct these Wellness Visits which will also include a thorough review of all your medications.    As you primary medical provider recommend that you make an appointment for your Annual Wellness Visit if you have not done so already this year.  You may set up this appointment before you leave today or you may call back ([phone]) and schedule an appointment.  Please make sure when you call that you mention that you are scheduling your Annual Wellness Visit with the clinical pharmacist so that the appointment may be made for the proper length of time.       Continue current medications. Continue good therapeutic lifestyle changes which include good diet and exercise. Fall precautions discussed with patient. If an FOBT was given today- please return it to our front desk. If you are over 74 years old - you may need Prevnar 13 or the adult Pneumonia vaccine.  Continue to monitor her blood sugars regularly, return to clinic tomorrow for lab work and during the FOBT Please schedule a visit for your eye exam as soon as possible

## 2013-08-31 ENCOUNTER — Other Ambulatory Visit (INDEPENDENT_AMBULATORY_CARE_PROVIDER_SITE_OTHER): Payer: Medicare HMO

## 2013-08-31 DIAGNOSIS — E119 Type 2 diabetes mellitus without complications: Secondary | ICD-10-CM

## 2013-08-31 DIAGNOSIS — E785 Hyperlipidemia, unspecified: Secondary | ICD-10-CM

## 2013-08-31 DIAGNOSIS — I2510 Atherosclerotic heart disease of native coronary artery without angina pectoris: Secondary | ICD-10-CM

## 2013-08-31 DIAGNOSIS — E039 Hypothyroidism, unspecified: Secondary | ICD-10-CM

## 2013-08-31 DIAGNOSIS — E559 Vitamin D deficiency, unspecified: Secondary | ICD-10-CM

## 2013-08-31 LAB — POCT CBC
Granulocyte percent: 65.8 %G (ref 37–80)
HCT, POC: 43.6 % (ref 43.5–53.7)
HEMOGLOBIN: 13.8 g/dL — AB (ref 14.1–18.1)
LYMPH, POC: 1.9 (ref 0.6–3.4)
MCH: 28.8 pg (ref 27–31.2)
MCHC: 31.6 g/dL — AB (ref 31.8–35.4)
MCV: 91.1 fL (ref 80–97)
MPV: 8.7 fL (ref 0–99.8)
POC Granulocyte: 4.2 (ref 2–6.9)
POC LYMPH PERCENT: 30.3 %L (ref 10–50)
Platelet Count, POC: 179 10*3/uL (ref 142–424)
RBC: 4.8 M/uL (ref 4.69–6.13)
RDW, POC: 13.7 %
WBC: 6.4 10*3/uL (ref 4.6–10.2)

## 2013-08-31 LAB — POCT GLYCOSYLATED HEMOGLOBIN (HGB A1C): Hemoglobin A1C: 8.8

## 2013-08-31 NOTE — Progress Notes (Signed)
Patient came in for labs only.

## 2013-09-01 ENCOUNTER — Other Ambulatory Visit: Payer: Self-pay | Admitting: Family Medicine

## 2013-09-01 LAB — BMP8+EGFR
BUN/Creatinine Ratio: 15 (ref 10–22)
BUN: 20 mg/dL (ref 8–27)
CO2: 26 mmol/L (ref 18–29)
CREATININE: 1.33 mg/dL — AB (ref 0.76–1.27)
Calcium: 9 mg/dL (ref 8.6–10.2)
Chloride: 99 mmol/L (ref 97–108)
GFR calc Af Amer: 60 mL/min/{1.73_m2} (ref >59)
GFR calc non Af Amer: 52 mL/min/{1.73_m2} — ABNORMAL LOW (ref >59)
Glucose: 160 mg/dL — ABNORMAL HIGH (ref 65–99)
Potassium: 5 mmol/L (ref 3.5–5.2)
Sodium: 139 mmol/L (ref 134–144)

## 2013-09-01 LAB — NMR, LIPOPROFILE
Cholesterol: 98 mg/dL (ref <200)
HDL Cholesterol by NMR: 39 mg/dL — ABNORMAL LOW (ref >=40)
HDL PARTICLE NUMBER: 30.9 umol/L (ref >=30.5)
LDL Particle Number: 524 nmol/L (ref <1000)
LDL SIZE: 20.4 nm (ref >20.5)
LDLC SERPL CALC-MCNC: 45 mg/dL (ref <100)
LP-IR SCORE: 52 — AB (ref <=45)
SMALL LDL PARTICLE NUMBER: 286 nmol/L (ref <=527)
Triglycerides by NMR: 71 mg/dL (ref <150)

## 2013-09-01 LAB — THYROID PANEL WITH TSH
Free Thyroxine Index: 2.2 (ref 1.2–4.9)
T3 Uptake Ratio: 32 % (ref 24–39)
T4, Total: 7 ug/dL (ref 4.5–12.0)
TSH: 0.927 u[IU]/mL (ref 0.450–4.500)

## 2013-09-01 LAB — HEPATIC FUNCTION PANEL
ALBUMIN: 3.8 g/dL (ref 3.5–4.8)
ALK PHOS: 122 IU/L — AB (ref 39–117)
ALT: 23 IU/L (ref 0–44)
AST: 21 IU/L (ref 0–40)
BILIRUBIN DIRECT: 0.14 mg/dL (ref 0.00–0.40)
BILIRUBIN TOTAL: 0.3 mg/dL (ref 0.0–1.2)
Total Protein: 6.3 g/dL (ref 6.0–8.5)

## 2013-09-01 LAB — VITAMIN D 25 HYDROXY (VIT D DEFICIENCY, FRACTURES): Vit D, 25-Hydroxy: 21.9 ng/mL — ABNORMAL LOW (ref 30.0–100.0)

## 2013-09-06 ENCOUNTER — Telehealth: Payer: Self-pay | Admitting: Family Medicine

## 2013-09-06 NOTE — Telephone Encounter (Signed)
Message copied by Azalee Course on Mon Sep 06, 2013 10:39 AM ------      Message from: Ernestina Penna      Created: Wed Sep 01, 2013  5:54 PM       The blood sugar is elevated at 160. The creatinine, the most important kidney function test remains slightly elevated at 1.33. This is lower than in the past. The electrolytes including potassium were within normal limits.      Liver function tests are within normal limits except one is slightly elevated as it has been in the past.      Cholesterol numbers by advanced lipid testing are excellent and at goal----continue Vytorin and as aggressive therapeutic lifestyle changes it is possible      Thyroid function tests are within normal limit      The vitamin D level remains low and we cannot increase the vitamin D anymore than he is currently taking because of his slightly elevated creatinine. Continue current treatment ------

## 2013-11-12 ENCOUNTER — Other Ambulatory Visit: Payer: Self-pay | Admitting: Family Medicine

## 2014-01-06 ENCOUNTER — Ambulatory Visit: Payer: Medicare HMO | Admitting: Family Medicine

## 2014-01-17 ENCOUNTER — Encounter: Payer: Self-pay | Admitting: Family Medicine

## 2014-01-17 ENCOUNTER — Ambulatory Visit (INDEPENDENT_AMBULATORY_CARE_PROVIDER_SITE_OTHER): Payer: Medicare HMO | Admitting: Family Medicine

## 2014-01-17 VITALS — BP 111/59 | HR 72 | Temp 97.3°F | Ht 68.0 in | Wt 160.0 lb

## 2014-01-17 DIAGNOSIS — I2510 Atherosclerotic heart disease of native coronary artery without angina pectoris: Secondary | ICD-10-CM

## 2014-01-17 DIAGNOSIS — Z89511 Acquired absence of right leg below knee: Secondary | ICD-10-CM

## 2014-01-17 DIAGNOSIS — E785 Hyperlipidemia, unspecified: Secondary | ICD-10-CM

## 2014-01-17 DIAGNOSIS — L570 Actinic keratosis: Secondary | ICD-10-CM

## 2014-01-17 DIAGNOSIS — S88119A Complete traumatic amputation at level between knee and ankle, unspecified lower leg, initial encounter: Secondary | ICD-10-CM

## 2014-01-17 DIAGNOSIS — N400 Enlarged prostate without lower urinary tract symptoms: Secondary | ICD-10-CM

## 2014-01-17 DIAGNOSIS — E119 Type 2 diabetes mellitus without complications: Secondary | ICD-10-CM

## 2014-01-17 DIAGNOSIS — I5040 Unspecified combined systolic (congestive) and diastolic (congestive) heart failure: Secondary | ICD-10-CM

## 2014-01-17 DIAGNOSIS — E559 Vitamin D deficiency, unspecified: Secondary | ICD-10-CM

## 2014-01-17 DIAGNOSIS — Z89512 Acquired absence of left leg below knee: Secondary | ICD-10-CM

## 2014-01-17 DIAGNOSIS — E039 Hypothyroidism, unspecified: Secondary | ICD-10-CM

## 2014-01-17 MED ORDER — INSULIN GLARGINE 100 UNIT/ML ~~LOC~~ SOLN: SUBCUTANEOUS | Status: DC

## 2014-01-17 MED ORDER — INSULIN LISPRO 100 UNIT/ML ~~LOC~~ SOLN: SUBCUTANEOUS | Status: DC

## 2014-01-17 MED ORDER — NORTRIPTYLINE HCL 25 MG PO CAPS: ORAL_CAPSULE | ORAL | Status: DC

## 2014-01-17 MED ORDER — OMEPRAZOLE 20 MG PO CPDR: DELAYED_RELEASE_CAPSULE | ORAL | Status: DC

## 2014-01-17 MED ORDER — LEVOTHYROXINE SODIUM 125 MCG PO TABS: ORAL_TABLET | ORAL | Status: DC

## 2014-01-17 MED ORDER — SIMVASTATIN 40 MG PO TABS: 40.0000 mg | ORAL_TABLET | Freq: Every day | ORAL | Status: DC

## 2014-01-17 MED ORDER — RAMIPRIL 2.5 MG PO CAPS: ORAL_CAPSULE | ORAL | Status: DC

## 2014-01-17 MED ORDER — GABAPENTIN 600 MG PO TABS: ORAL_TABLET | ORAL | Status: DC

## 2014-01-17 NOTE — Progress Notes (Signed)
Subjective:    Patient ID: Christopher Padilla, male    DOB: 1940/05/19, 73 y.o.   MRN: [id number]  HPI Pt here for follow up and management of chronic medical problems. The patient has no particular complaints today. He is to return to the office for fasting lab work. He requests that all his medications be refilled. He is due to get a PSA and prostate exam and an FOBT. He is concerned about the cost of vytorin. The patient is also concerned about 2 patches of dry skin on his left temple. The patient is a bilateral below the knee amputee. He is to for his eye exam and for a visit with the cardiologist.         Patient Active Problem List   Diagnosis Date Noted  . Hx of BKA, bilateral 05/03/2013  . Mitral regurgitation 07/03/2011  . HYPOTHYROIDISM 11/05/2008  . Insulin-dependent diabetes mellitus,250.72 11/05/2008  . HYPERLIPIDEMIA 11/05/2008  . CEREBROVASCULAR DISEASE 11/05/2008  . TOBACCO ABUSE, HX OF 11/05/2008  . CAD 10/04/2008  . HEART FAILURE, COMBINED UNSPEC. 10/04/2008   Outpatient Encounter Prescriptions as of 01/17/2014  Medication Sig  . aspirin 81 MG tablet Take 81 mg by mouth daily.    . BD INSULIN SYRINGE ULTRAFINE 31G X 15/64" 0.5 ML MISC USE AS DIRECTED  . Cholecalciferol (VITAMIN D PO) Take 1 tablet by mouth 2 (two) times daily.  Marland Kitchen ezetimibe-simvastatin (VYTORIN) 10-40 MG per tablet Take 1 tablet by mouth daily.  Marland Kitchen gabapentin (NEURONTIN) 600 MG tablet TAKE 1 CAPSULE BY MOUTH THREE TIMES A DAY  . HUMALOG 100 UNIT/ML injection INJECT 0-20 UNITS INTO THE SKIN AS DIRECTED. SLIDING SCALE  . insulin glargine (LANTUS) 100 UNIT/ML injection INJECT 4O UNITS SUBCUTANEOUSLY AT BEDTIME  . levothyroxine (SYNTHROID, LEVOTHROID) 125 MCG tablet TAKE 1 TABLET BY MOUTH DAILY  . nortriptyline (PAMELOR) 25 MG capsule TAKE 1 CAPSULE BY MOUTH AT BEDTIME  . omeprazole (PRILOSEC) 20 MG capsule TAKE 1 CAPSULE BY MOUTH DAILY  . ONE TOUCH ULTRA TEST test strip USE TO TEST SUGAR 4 TIMES  DAILY  . ramipril (ALTACE) 2.5 MG capsule TAKE 1 CAPSULE BY MOUTH DAILY    Review of Systems  Constitutional: Negative.   HENT: Negative.   Eyes: Negative.   Respiratory: Negative.   Cardiovascular: Negative.   Gastrointestinal: Negative.   Endocrine: Negative.   Genitourinary: Negative.   Musculoskeletal: Negative.   Skin: Negative.   Allergic/Immunologic: Negative.   Neurological: Negative.   Hematological: Negative.   Psychiatric/Behavioral: Negative.        Objective:   Physical Exam  Nursing note and vitals reviewed. Constitutional: He is oriented to person, place, and time. He appears well-developed and well-nourished. No distress.  The patient is alert and cooperative.  HENT:  Head: Normocephalic and atraumatic.  Right Ear: External ear normal.  Left Ear: External ear normal.  Nose: Nose normal.  Mouth/Throat: Oropharynx is clear and moist. No oropharyngeal exudate.  He has nasal congestion on the right side  Eyes: Conjunctivae and EOM are normal. Pupils are equal, round, and reactive to light. Right eye exhibits no discharge. Left eye exhibits no discharge. No scleral icterus.  Neck: Normal range of motion. Neck supple. No thyromegaly present.  He has bilateral supraclavicular bruits  Cardiovascular: Normal rate, regular rhythm, normal heart sounds and intact distal pulses.  Exam reveals no gallop and no friction rub.   No murmur heard. The heart has a regular rhythm at 72 per minute. The radial distal  pulses were palpable. He is a bilateral below-the-knee amputee.  Pulmonary/Chest: Effort normal and breath sounds normal. No respiratory distress. He has no wheezes. He has no rales. He exhibits no tenderness.  Abdominal: Soft. Bowel sounds are normal. He exhibits no mass. There is no tenderness. There is no rebound and no guarding.  Genitourinary: Rectum normal and penis normal.  The patient has a high anal opening palpating the prostate  was difficult. The anus was  normal. The external genitalia were normal. There were no inguinal nodes. There were no inguinal hernias. Bilateral inguinal pulses were diminished  Musculoskeletal: Normal range of motion. He exhibits no edema and no tenderness.  Lymphadenopathy:    He has no cervical adenopathy.  Neurological: He is alert and oriented to person, place, and time. He has normal reflexes. No cranial nerve deficit.  Skin: Skin is warm and dry. No rash noted. No erythema. No pallor.  Cryotherapy was performed to 2 actinic keratoses on the left temple area without problem. The patient tolerated the procedure well.  Psychiatric: He has a normal mood and affect. His behavior is normal. Judgment and thought content normal.    BP 111/59  Pulse 72  Temp(Src) 97.3 F (36.3 C) (Oral)  Ht 5' 8" (1.727 m)  Wt 160 lb (72.576 kg)  BMI 24.33 kg/m2       Assessment & Plan:  1. CAD - POCT CBC; Future - BMP8+EGFR; Future - Hepatic function panel; Future  2. HYPERLIPIDEMIA - POCT CBC; Future - NMR, lipoprofile; Future  3. HYPOTHYROIDISM - POCT CBC; Future  4. HEART FAILURE, COMBINED UNSPEC. - POCT CBC; Future - BMP8+EGFR; Future - Hepatic function panel; Future  5. Insulin-dependent diabetes mellitus,250.72 - POCT CBC; Future - POCT glycosylated hemoglobin (Hb A1C); Future - POCT UA - Microalbumin  6. BPH (benign prostatic hyperplasia) - POCT CBC; Future - POCT UA - Microscopic Only - POCT urinalysis dipstick - PSA, total and free; Future  7. Vitamin D deficiency - Vit D  25 hydroxy (rtn osteoporosis monitoring); Future  8. S/P bilateral BKA (below knee amputation)  9. Actinic keratoses   Meds ordered this encounter  Medications  . ramipril (ALTACE) 2.5 MG capsule    Sig: TAKE 1 CAPSULE BY MOUTH DAILY    Dispense:  90 capsule    Refill:  3  . omeprazole (PRILOSEC) 20 MG capsule    Sig: TAKE 1 CAPSULE BY MOUTH DAILY    Dispense:  90 capsule    Refill:  3  . nortriptyline (PAMELOR) 25 MG  capsule    Sig: TAKE 1 CAPSULE BY MOUTH AT BEDTIME    Dispense:  90 capsule    Refill:  3  . levothyroxine (SYNTHROID, LEVOTHROID) 125 MCG tablet    Sig: TAKE 1 TABLET BY MOUTH DAILY    Dispense:  90 tablet    Refill:  5  . gabapentin (NEURONTIN) 600 MG tablet    Sig: TAKE 1 CAPSULE BY MOUTH THREE TIMES A DAY    Dispense:  270 tablet    Refill:  2  . insulin glargine (LANTUS) 100 UNIT/ML injection    Sig: INJECT 4O UNITS SUBCUTANEOUSLY AT BEDTIME    Dispense:  30 mL    Refill:  5  . insulin lispro (HUMALOG) 100 UNIT/ML injection    Sig: INJECT 0-20 UNITS INTO THE SKIN AS DIRECTED. SLIDING SCALE    Dispense:  10 mL    Refill:  5  . simvastatin (ZOCOR) 40 MG tablet  Sig: Take 1 tablet (40 mg total) by mouth at bedtime.    Dispense:  90 tablet    Refill:  3   Patient Instructions                       Medicare Annual Wellness Visit  [facility] and the medical providers at [facility] strive to bring you the best medical care.  In doing so we not only want to address your current medical conditions and concerns but also to detect new conditions early and prevent illness, disease and health-related problems.    Medicare offers a yearly Wellness Visit which allows our clinical staff to assess your need for preventative services including immunizations, lifestyle education, counseling to decrease risk of preventable diseases and screening for fall risk and other medical concerns.    This visit is provided free of charge (no copay) for all Medicare recipients. The clinical pharmacists at [facility] have begun to conduct these Wellness Visits which will also include a thorough review of all your medications.    As you primary medical provider recommend that you make an appointment for your Annual Wellness Visit if you have not done so already this year.  You may set up this appointment before you leave today or you may call back ([phone])  and schedule an appointment.  Please make sure when you call that you mention that you are scheduling your Annual Wellness Visit with the clinical pharmacist so that the appointment may be made for the proper length of time.     Continue current medications. Continue good therapeutic lifestyle changes which include good diet and exercise. Fall precautions discussed with patient. If an FOBT was given today- please return it to our front desk. If you are over 17 years old - you may need Prevnar 19 or the adult Pneumonia vaccine.  Flu Shots will be available at our office starting mid- September. Please call and schedule a FLU CLINIC APPOINTMENT.   Please keep scheduled visit with cardiology Please schedule visit for eye exam Return to clinic for lab work and return the FOBT We will try the simvastatin for 3 months and if your cholesterol still remains high we will switch to Lipitor generic   Arrie Senate MD

## 2014-01-17 NOTE — Addendum Note (Signed)
Addended by: Tommas Olp on: 01/17/2014 11:57 AM   Modules accepted: Orders

## 2014-01-17 NOTE — Patient Instructions (Addendum)
Medicare Annual Wellness Visit  [facility] and the medical providers at [facility] strive to bring you the best medical care.  In doing so we not only want to address your current medical conditions and concerns but also to detect new conditions early and prevent illness, disease and health-related problems.    Medicare offers a yearly Wellness Visit which allows our clinical staff to assess your need for preventative services including immunizations, lifestyle education, counseling to decrease risk of preventable diseases and screening for fall risk and other medical concerns.    This visit is provided free of charge (no copay) for all Medicare recipients. The clinical pharmacists at [facility] have begun to conduct these Wellness Visits which will also include a thorough review of all your medications.    As you primary medical provider recommend that you make an appointment for your Annual Wellness Visit if you have not done so already this year.  You may set up this appointment before you leave today or you may call back ([phone]) and schedule an appointment.  Please make sure when you call that you mention that you are scheduling your Annual Wellness Visit with the clinical pharmacist so that the appointment may be made for the proper length of time.     Continue current medications. Continue good therapeutic lifestyle changes which include good diet and exercise. Fall precautions discussed with patient. If an FOBT was given today- please return it to our front desk. If you are over 30 years old - you may need Prevnar 13 or the adult Pneumonia vaccine.  Flu Shots will be available at our office starting mid- September. Please call and schedule a FLU CLINIC APPOINTMENT.   Please keep scheduled visit with cardiology Please schedule visit for eye exam Return to clinic for lab work and return the FOBT We will try the  simvastatin for 3 months and if your cholesterol still remains high we will switch to Lipitor generic

## 2014-01-31 ENCOUNTER — Other Ambulatory Visit (INDEPENDENT_AMBULATORY_CARE_PROVIDER_SITE_OTHER): Payer: Medicare HMO

## 2014-01-31 DIAGNOSIS — I5040 Unspecified combined systolic (congestive) and diastolic (congestive) heart failure: Secondary | ICD-10-CM

## 2014-01-31 DIAGNOSIS — N400 Enlarged prostate without lower urinary tract symptoms: Secondary | ICD-10-CM

## 2014-01-31 DIAGNOSIS — E039 Hypothyroidism, unspecified: Secondary | ICD-10-CM

## 2014-01-31 DIAGNOSIS — E785 Hyperlipidemia, unspecified: Secondary | ICD-10-CM

## 2014-01-31 DIAGNOSIS — E119 Type 2 diabetes mellitus without complications: Secondary | ICD-10-CM

## 2014-01-31 DIAGNOSIS — E559 Vitamin D deficiency, unspecified: Secondary | ICD-10-CM

## 2014-01-31 DIAGNOSIS — I2510 Atherosclerotic heart disease of native coronary artery without angina pectoris: Secondary | ICD-10-CM

## 2014-01-31 LAB — POCT CBC
Granulocyte percent: 65.2 %G (ref 37–80)
HEMATOCRIT: 42.1 % — AB (ref 43.5–53.7)
HEMOGLOBIN: 13.9 g/dL — AB (ref 14.1–18.1)
Lymph, poc: 1.8 (ref 0.6–3.4)
MCH: 29.3 pg (ref 27–31.2)
MCHC: 33 g/dL (ref 31.8–35.4)
MCV: 89 fL (ref 80–97)
MPV: 8.7 fL (ref 0–99.8)
POC GRANULOCYTE: 4 (ref 2–6.9)
POC LYMPH PERCENT: 29.4 %L (ref 10–50)
Platelet Count, POC: 166 10*3/uL (ref 142–424)
RBC: 4.7 M/uL (ref 4.69–6.13)
RDW, POC: 13.5 %
WBC: 6.2 10*3/uL (ref 4.6–10.2)

## 2014-01-31 LAB — POCT GLYCOSYLATED HEMOGLOBIN (HGB A1C): HEMOGLOBIN A1C: 9.1

## 2014-01-31 NOTE — Progress Notes (Signed)
Pt came in for lab  only 

## 2014-02-01 LAB — PSA, TOTAL AND FREE
PSA, Free Pct: 36.7 %
PSA, Free: 0.33 ng/mL
PSA: 0.9 ng/mL (ref 0.0–4.0)

## 2014-02-01 LAB — BMP8+EGFR
BUN/Creatinine Ratio: 13 (ref 10–22)
BUN: 18 mg/dL (ref 8–27)
CALCIUM: 8.9 mg/dL (ref 8.6–10.2)
CO2: 25 mmol/L (ref 18–29)
CREATININE: 1.39 mg/dL — AB (ref 0.76–1.27)
Chloride: 97 mmol/L (ref 97–108)
GFR calc Af Amer: 57 mL/min/{1.73_m2} — ABNORMAL LOW (ref >59)
GFR calc non Af Amer: 49 mL/min/{1.73_m2} — ABNORMAL LOW (ref >59)
Glucose: 218 mg/dL — ABNORMAL HIGH (ref 65–99)
Potassium: 4.7 mmol/L (ref 3.5–5.2)
Sodium: 138 mmol/L (ref 134–144)

## 2014-02-01 LAB — HEPATIC FUNCTION PANEL
ALBUMIN: 3.8 g/dL (ref 3.5–4.8)
ALT: 17 IU/L (ref 0–44)
AST: 15 IU/L (ref 0–40)
Alkaline Phosphatase: 106 IU/L (ref 39–117)
Bilirubin, Direct: 0.14 mg/dL (ref 0.00–0.40)
Total Bilirubin: 0.4 mg/dL (ref 0.0–1.2)
Total Protein: 6.1 g/dL (ref 6.0–8.5)

## 2014-02-01 LAB — NMR, LIPOPROFILE
Cholesterol: 94 mg/dL — ABNORMAL LOW (ref 100–199)
HDL CHOLESTEROL BY NMR: 43 mg/dL (ref >39)
HDL Particle Number: 31.4 umol/L (ref >=30.5)
LDL PARTICLE NUMBER: 449 nmol/L (ref <1000)
LDL SIZE: 20.2 nm (ref >20.5)
LDLC SERPL CALC-MCNC: 40 mg/dL (ref 0–99)
LP-IR Score: 47 — ABNORMAL HIGH (ref <=45)
Small LDL Particle Number: 304 nmol/L (ref <=527)
TRIGLYCERIDES BY NMR: 53 mg/dL (ref 0–149)

## 2014-02-01 LAB — VITAMIN D 25 HYDROXY (VIT D DEFICIENCY, FRACTURES): Vit D, 25-Hydroxy: 47.8 ng/mL (ref 30.0–100.0)

## 2014-02-14 ENCOUNTER — Encounter (INDEPENDENT_AMBULATORY_CARE_PROVIDER_SITE_OTHER): Payer: Self-pay

## 2014-02-14 ENCOUNTER — Ambulatory Visit (INDEPENDENT_AMBULATORY_CARE_PROVIDER_SITE_OTHER): Payer: Medicare HMO | Admitting: Pharmacist

## 2014-02-14 ENCOUNTER — Encounter: Payer: Self-pay | Admitting: Pharmacist

## 2014-02-14 VITALS — BP 110/68 | HR 70 | Ht 68.0 in | Wt 160.0 lb

## 2014-02-14 DIAGNOSIS — Z794 Long term (current) use of insulin: Principal | ICD-10-CM

## 2014-02-14 DIAGNOSIS — E1339 Other specified diabetes mellitus with other diabetic ophthalmic complication: Secondary | ICD-10-CM

## 2014-02-14 DIAGNOSIS — Z23 Encounter for immunization: Secondary | ICD-10-CM

## 2014-02-14 DIAGNOSIS — E785 Hyperlipidemia, unspecified: Secondary | ICD-10-CM

## 2014-02-14 DIAGNOSIS — IMO0001 Reserved for inherently not codable concepts without codable children: Secondary | ICD-10-CM

## 2014-02-14 DIAGNOSIS — E1139 Type 2 diabetes mellitus with other diabetic ophthalmic complication: Principal | ICD-10-CM

## 2014-02-14 LAB — GLUCOSE, POCT (MANUAL RESULT ENTRY): POC Glucose: 168 mg/dl — AB (ref 70–99)

## 2014-02-14 NOTE — Progress Notes (Signed)
Subjective:    MANUAL BROAS is a 73 y.o. male who presents for an initial evaluation of Type 2 diabetes mellitus and diabetes education.  Current symptoms/problems include hyperglycemia, hypoglycemia - occurs about 2 times per week.  and polydipsia and have been unchanged. Symptoms have been present for 2 months.  The patient was initially diagnosed with Type 2 diabetes mellitus for 35 year (at 73 yo).  Known diabetic complications: retinopathy, peripheral neuropathy, cardiovascular disease and amplutations - BKA  - bilaterally and pointer finger of left hand Cardiovascular risk factors: advanced age (older than 31 for men, 45 for women), diabetes mellitus, dyslipidemia, hypertension and male gender Current diabetic medications include Lantus 35 units at bedtime and Humalog per sliding scale.   Eye exam current (within one year): no - last was 01/17/2014 Weight trend: stable Prior visit with dietician: yes  Current diet: patient is usually skipping lunch or only eating salad or crackers.  He is not bolousing for lunch.  He admists to being very hungry at supper and eating more than at other meals  / often getting seconds. 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  He is finding it increasingly difficult to get adequate sample from this hand due to thickening skin. Home blood sugar records: I do not have record of BG readings but patient reports am BG usually 70's to 110's;  not sure about other times becuase he is not checking consistantly. Any episodes of hypoglycemia? yes - occurs about 1-2 times per week - usually in the am.  Patient recently decreased Lantus from 40 units qhs to 35units qhs and hypoglycemia has decreased.  Is He on ACE inhibitor or angiotensin II receptor blocker?  Yes  ramipril (Altace)    The following  portions of the patient's history were reviewed and updated as appropriate: allergies, current medications, past family history, past medical history, past social history, past surgical history and problem list.   Objective:    BP 110/68  Pulse 70  Ht 5\' 8"  (1.727 m)  Wt 160 lb (72.576 kg)  BMI 24.33 kg/m2  Lab Review Glucose (mg/dL)  Date Value  0/88/1103 218*  08/31/2013 160*  05/31/2013 110*     Glucose, Bld (mg/dL)  Date Value  1/59/4585 124*  10/17/2011 110*     CO2 (mmol/L)  Date Value  01/31/2014 25   08/31/2013 26   05/31/2013 25      BUN (mg/dL)  Date Value  02/01/2445 18   08/31/2013 20   05/31/2013 19   09/29/2012 18   10/17/2011 19      Creat (mg/dL)  Date Value  2/86/3817 1.22      Creatinine, Ser (mg/dL)  Date Value  11/13/6577 1.39*  08/31/2013 1.33*  05/31/2013 1.36*      Assessment:    Diabetes Mellitus type II, under inadequate control.    Plan:    1.  Rx changes: Continue Lantus 35 units qhs.   Recommend increase checking BG to tid - qid (prior to each meal and at bedtime)   Humalog with each meal per sliding scale - OK to take after meal if need to wait to count CHO's 2.  Education: Reviewed 'ABCs' of diabetes management (respective goals in parentheses):  A1C (<7), blood pressure (<130/80), and cholesterol (LDL <100). 3.  Compliance at present  is estimated to be inadequate. Efforts to improve compliance (if necessary) will be directed at dietary modifications:  eat a more susbtantial lunch so not so hungry at evening meal.  Reviewed CHO counting.   and regular blood sugar monitoring: 3 to 4  times daily.  Also gave patient new lancing device which might assist in checking BG.  We also discussed alternate site testing due to his difficulty getting sample from fingers.  Discussed adjusting depth of lancing device to assist in getting better blood sample.  Patient also to ask wife to help check right hand from time to time.  4. Follow up: 4 weeks  -   Patient is instructed to bring BG monitor.   Henrene Pastorammy Shannon Kirkendall, PharmD, CPP, CDE

## 2014-02-14 NOTE — Patient Instructions (Signed)
Check blood glucose before each meal.  Give Humalog prior to or immediately after each meal. Continue Lantus 35 units at bedtime.    Diabetes and Standards of Medical Care  Diabetes is complicated. You may find that your diabetes team includes a dietitian, nurse, diabetes educator, eye doctor, and more. To help everyone know what is going on and to help you get the care you deserve, the following schedule of care was developed to help keep you on track. Below are the tests, exams, vaccines, medicines, education, and plans you will need.  Blood Glucose Goals Prior to meals = 80 - 130 Within 2 hours of the start of a meal = less than 180  HbA1c test (goal is less than 7.0% - your last value was 9.1%) This test shows how well you have controlled your glucose over the past 2 3 months. It is used to see if your diabetes management plan needs to be adjusted.   It is performed at least 2 times a year if you are meeting treatment goals.  It is performed 4 times a year if therapy has changed or if you are not meeting treatment goals.   Blood pressure test  This test is performed at every routine medical visit. The goal is less than 140/90 mmHg for most people, but 130/80 mmHg in some cases. Ask your health care provider about your goal. Dental exam  Follow up with the dentist regularly. Eye exam  If you are diagnosed with type 1 diabetes as a child, get an exam upon reaching the age of 50 years or older and have had diabetes for 3 5 years. Yearly eye exams are recommended after that initial eye exam.  If you are diagnosed with type 1 diabetes as an adult, get an exam within 5 years of diagnosis and then yearly.  If you are diagnosed with type 2 diabetes, get an exam as soon as possible after the diagnosis and then yearly. Foot care exam  Visual foot exams are performed at every routine medical visit. The exams check for cuts, injuries, or other problems with the feet.  A comprehensive  foot exam should be done yearly. This includes visual inspection as well as assessing foot pulses and testing for loss of sensation.  Check your feet nightly for cuts, injuries, or other problems with your feet. Tell your health care provider if anything is not healing. Kidney function test (urine microalbumin)  This test is performed once a year.  Type 1 diabetes: The first test is performed 5 years after diagnosis.  Type 2 diabetes: The first test is performed at the time of diagnosis.  A serum creatinine and estimated glomerular filtration rate (eGFR) test is done once a year to assess the level of chronic kidney disease (CKD), if present. Lipid profile (cholesterol, HDL, LDL, triglycerides)  Performed every 5 years for most people.  The goal for LDL is less than 100 mg/dL. If you are at high risk, the goal is less than 70 mg/dL.  The goal for HDL is 40 mg/dL 50 mg/dL for men and 50 mg/dL 60 mg/dL for women. An HDL cholesterol of 60 mg/dL or higher gives some protection against heart disease.  The goal for triglycerides is less than 150 mg/dL. Influenza vaccine, pneumococcal vaccine, and hepatitis B vaccine  The influenza vaccine is recommended yearly.  The pneumococcal vaccine is generally given once in a lifetime. However, there are some instances when another vaccination is recommended. Check with your health  care provider.  The hepatitis B vaccine is also recommended for adults with diabetes. Diabetes self-management education  Education is recommended at diagnosis and ongoing as needed. Treatment plan  Your treatment plan is reviewed at every medical visit. Document Released: 02/17/2009 Document Revised: 12/23/2012 Document Reviewed: 09/22/2012 [facility] Patient Information 2014 [employer].

## 2014-03-21 ENCOUNTER — Encounter: Payer: Self-pay | Admitting: Pharmacist

## 2014-03-21 ENCOUNTER — Ambulatory Visit (INDEPENDENT_AMBULATORY_CARE_PROVIDER_SITE_OTHER): Payer: Medicare HMO | Admitting: Pharmacist

## 2014-03-21 VITALS — BP 114/68 | HR 70 | Ht 68.0 in | Wt 156.0 lb

## 2014-03-21 DIAGNOSIS — E1140 Type 2 diabetes mellitus with diabetic neuropathy, unspecified: Secondary | ICD-10-CM

## 2014-03-21 DIAGNOSIS — IMO0002 Reserved for concepts with insufficient information to code with codable children: Secondary | ICD-10-CM

## 2014-03-21 DIAGNOSIS — Z0000 Encounter for general adult medical examination without abnormal findings: Secondary | ICD-10-CM

## 2014-03-21 DIAGNOSIS — E1165 Type 2 diabetes mellitus with hyperglycemia: Secondary | ICD-10-CM

## 2014-03-21 NOTE — Progress Notes (Signed)
Subjective:    Christopher Padilla is a 73 y.o. male who presents for Medicare Initial Wellness Visit and to recheck diabetes  Patient has had variable control of diabetes.  Last A1c was 9.1% (09/28/20150 Currently taking Lantus 35 units qhs (this dose was decreased from 40 units about 1 month ago due to hypoglycemia prior to lunch)  And humalog sliding scale (patient uses 4 units to cover meals and then adds extra base on his BG - roughly 1 unit for every 20 points BG is greater than 100).  Patient reports decreased incidence of hypoglycemia.   He checks BG 2-3 times a day.  Also admit that he does not usually give insulin at lunch.   Preventive Screening-Counseling & Management  Tobacco History  Smoking status  . Former Smoker -- 1.00 packs/day  . Types: Cigarettes  . Start date: 11/04/1954  . Quit date: 05/06/1988  Smokeless tobacco  . Never Used    Current Problems (verified) Patient Active Problem List   Diagnosis Date Noted  . Hx of BKA, bilateral 05/03/2013  . Mitral regurgitation 07/03/2011  . HYPOTHYROIDISM 11/05/2008  . 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 Hyperlipidemia LDL goal <70 11/05/2008  . CEREBROVASCULAR DISEASE 11/05/2008  . TOBACCO ABUSE, HX OF 11/05/2008  . CAD 10/04/2008  . HEART FAILURE, COMBINED UNSPEC. 10/04/2008    Medications Prior to Visit Current Outpatient Prescriptions on File Prior to Visit  Medication Sig Dispense Refill  . aspirin 81 MG tablet Take 81 mg by mouth daily.      . BD INSULIN SYRINGE ULTRAFINE 31G X 15/64" 0.5 ML MISC USE AS DIRECTED 100 each 2  . Cholecalciferol (VITAMIN D PO) Take 1 tablet by mouth 2 (two) times daily.    Marland Kitchen. gabapentin (NEURONTIN) 600 MG tablet TAKE 1 CAPSULE BY MOUTH THREE TIMES A DAY 270 tablet 2  . insulin glargine (LANTUS) 100 UNIT/ML injection INJECT 4O UNITS SUBCUTANEOUSLY AT BEDTIME 30 mL 5  . insulin lispro (HUMALOG) 100 UNIT/ML injection INJECT 0-20 UNITS INTO  THE SKIN AS DIRECTED. SLIDING SCALE 10 mL 5  . levothyroxine (SYNTHROID, LEVOTHROID) 125 MCG tablet TAKE 1 TABLET BY MOUTH DAILY 90 tablet 5  . nortriptyline (PAMELOR) 25 MG capsule TAKE 1 CAPSULE BY MOUTH AT BEDTIME 90 capsule 3  . omeprazole (PRILOSEC) 20 MG capsule TAKE 1 CAPSULE BY MOUTH DAILY 90 capsule 3  . ONE TOUCH ULTRA TEST test strip USE TO TEST SUGAR 4 TIMES DAILY 100 each 4  . ramipril (ALTACE) 2.5 MG capsule TAKE 1 CAPSULE BY MOUTH DAILY 90 capsule 3  . simvastatin (ZOCOR) 40 MG tablet Take 1 tablet (40 mg total) by mouth at bedtime. 90 tablet 3   No current facility-administered medications on file prior to visit.    Current Medications (verified) Current Outpatient Prescriptions  Medication Sig Dispense Refill  . aspirin 81 MG tablet Take 81 mg by mouth daily.      . BD INSULIN SYRINGE ULTRAFINE 31G X 15/64" 0.5 ML MISC USE AS DIRECTED 100 each 2  . Cholecalciferol (VITAMIN D PO) Take 1 tablet by mouth 2 (two) times daily.    Marland Kitchen. gabapentin (NEURONTIN) 600 MG tablet TAKE 1 CAPSULE BY MOUTH THREE TIMES A DAY 270 tablet 2  . insulin glargine (LANTUS) 100 UNIT/ML injection INJECT 4O UNITS SUBCUTANEOUSLY AT BEDTIME 30 mL 5  . insulin lispro (HUMALOG) 100 UNIT/ML injection INJECT 0-20 UNITS INTO THE SKIN AS DIRECTED. SLIDING SCALE 10 mL 5  . levothyroxine (  SYNTHROID, LEVOTHROID) 125 MCG tablet TAKE 1 TABLET BY MOUTH DAILY 90 tablet 5  . nortriptyline (PAMELOR) 25 MG capsule TAKE 1 CAPSULE BY MOUTH AT BEDTIME 90 capsule 3  . omeprazole (PRILOSEC) 20 MG capsule TAKE 1 CAPSULE BY MOUTH DAILY 90 capsule 3  . ONE TOUCH ULTRA TEST test strip USE TO TEST SUGAR 4 TIMES DAILY 100 each 4  . ramipril (ALTACE) 2.5 MG capsule TAKE 1 CAPSULE BY MOUTH DAILY 90 capsule 3  . simvastatin (ZOCOR) 40 MG tablet Take 1 tablet (40 mg total) by mouth at bedtime. 90 tablet 3   No current facility-administered medications for this visit.     Allergies (verified) Codeine; Ciprofloxacin; and Nsaids    PAST HISTORY  Family History Family History  Problem Relation Age of Onset  . Cancer Mother     brain and lung  . Diabetes Father 92  . Coronary artery disease Father   . Colon cancer Neg Hx   . Diabetes Sister   . Diabetes Brother   . Diabetes Son     Social History History  Substance Use Topics  . Smoking status: Former Smoker -- 1.00 packs/day    Types: Cigarettes    Start date: 11/04/1954    Quit date: 05/06/1988  . Smokeless tobacco: Never Used  . Alcohol Use: No    Are there smokers in your home (other than you)?  Yes - wife  Risk Factors Current exercise habits: Home exercise routine includes exercising arms and shoulders - unable to walk much. Dietary issues discussed: CHO counting   Cardiac risk factors: advanced age (older than 65 for men, 38 for women), diabetes mellitus, dyslipidemia, family history of premature cardiovascular disease, male gender and sedentary lifestyle.  Depression Screen (Note: if answer to either of the following is "Yes", a more complete depression screening is indicated)   Q1: Over the past two weeks, have you felt down, depressed or hopeless? No  Q2: Over the past two weeks, have you felt little interest or pleasure in doing things? No  Have you lost interest or pleasure in daily life? No  Do you often feel hopeless? No  Do you cry easily over simple problems? No  Activities of Daily Living In your present state of health, do you have any difficulty performing the following activities?:  Driving? No Managing money?  No Feeding yourself? No Getting from bed to chair? No Climbing a flight of stairs? Yes - has BKA - uses cane Preparing food and eating?: No Bathing or showering? No - uses bench and has handheld shower Getting dressed: No Getting to the toilet? No Using the toilet:No Moving around from place to place: No In the past year have you fallen or had a near fall?:No   Are you sexually active?  No  Do you have more  than one partner?  No  Hearing Difficulties: No Do you often ask people to speak up or repeat themselves? No Do you experience ringing or noises in your ears? No Do you have difficulty understanding soft or whispered voices? No   Do you feel that you have a problem with memory? No  Do you often misplace items? No  Do you feel safe at home?  Yes  Cognitive Testing  Alert? Yes  Normal Appearance?Yes  Oriented to person? Yes  Place? Yes   Time? Yes  Recall of three objects?  Yes  Can perform simple calculations? Yes  Displays appropriate judgment?Yes  Can read the correct time  from a watch face?Yes   Advanced Directives have been discussed with the patient? Yes   List the Names of Other Physician/Practitioners you currently use: 1.  Lajoyce Corners - Careers adviser / amputation specialist 2.  Hocherin - cardiologist 3.  Jarold Motto - GI 4.  Daphine Deutscher - optometrist.  Indicate any recent Medical Services you may have received from other than [facility] providers in the past year (date may be approximate).  Immunization History  Administered Date(s) Administered  . Influenza,inj,Quad PF,36+ Mos 01/26/2013, 02/14/2014  . Pneumococcal Conjugate-13 05/03/2013  . Zoster 08/30/2013    Screening Tests Health Maintenance  Topic Date Due  . OPHTHALMOLOGY EXAM  01/17/2014  . URINE MICROALBUMIN  05/03/2014  . HEMOGLOBIN A1C  08/01/2014  . INFLUENZA VACCINE  12/05/2014  . FOOT EXAM  01/18/2015  . TETANUS/TDAP  01/04/2021  . COLONOSCOPY  11/12/2022  . PNEUMOCOCCAL POLYSACCHARIDE VACCINE AGE 66 AND OVER  Completed  . ZOSTAVAX  Completed    All answers were reviewed with the patient and necessary referrals were made:  Henrene Pastor, [facility]   03/21/2014   History reviewed: allergies, current medications, past family history, past medical history, past social history, past surgical history and problem list  Review of Systems Constitutional: negative except for weight loss Cardiovascular:  negative Gastrointestinal: negative except for constipation Musculoskeletal:positive for muscle weakness and stiff joints   Objective:      Blood pressure 114/68, pulse 70, height 5\' 8"  (1.727 m), weight 156 lb (70.761 kg). Body mass index is 23.73 kg/(m^2).    Assessment:     Initial Annual Wellness Visit Uncontrolled DM - type 2 requiring insulin      Plan:     During the course of the visit the patient was educated and counseled about appropriate screening and preventive services including:    Pneumococcal vaccine - UTd  Influenza vaccine -UTD  Td vaccine - UTD  Zostavax - UTD  Prostate cancer screening - UTD   Colorectal cancer screening - UTD  Diabetic EYe exam and Glaucoma screening - referral made today  Advanced directives: has NO advanced directive  - add't info requested. Referral to SW: caring connection packet given  Changes in Humalog dosing  Less than 30 grams of carbohydrates 30 to 45  grams of carbohydrates Over 45 grams of carbohydrates  2 units of humalog insulin 4 units of humalog insulin 6 units of humalog insulin    Blood glucose  Amount of Humalog insulin   Less than 100 none   101 to 120 1 unit   121 to 140 2 units   141 to 160 3 units   161 to 180 4 units   181 to 200 5 units   201- 220 6 units   221 to 240 8 units    ** glucose reading - 100 then divide by 20  Patient Instructions (the written plan) was given to the patient.  Medicare Attestation I have personally reviewed: The patient's medical and social history Their use of alcohol, tobacco or illicit drugs Their current medications and supplements The patient's functional ability including ADLs,fall risks, home safety risks, cognitive, and hearing and visual impairment Diet and physical activities Evidence for depression or mood disorders  The patient's weight, height, BMI, and HR / BP have been recorded in the chart.  I have made referrals, counseling, and provided  education to the patient based on review of the above and I have provided the patient with a written personalized care plan for preventive services.  Henrene Pastor, [facility]   03/21/2014

## 2014-03-21 NOTE — Patient Instructions (Signed)
Less than 30 grams of carbohydrates 30 to 45  grams of carbohydrates Over 45 grams of carbohydrates  2 units of humalog insulin 4 units of humalog insulin 6 units of humalog insulin     Blood glucose  Amount of Humalog insulin   Less than 100 none   101 to 120 1 unit   121 to 140 2 units   141 to 160 3 units   161 to 180 4 units   181 to 200 5 units   201- 220 6 units   221 to 240 8 units    ** glucose reading - 100 then divide by 20   Health Maintenance Summary     OPHTHALMOLOGY EXAM Overdue 01/17/2014 Sent referral for appt    URINE MICROALBUMIN Next Due 05/03/2014     HEMOGLOBIN A1C Next Due 08/01/2014     INFLUENZA VACCINE Next Due 12/05/2014     FOOT EXAM Next Due 01/18/2015     TETANUS/TDAP Next Due 01/04/2021    Zostavax / shingles Completed     Pneumonia vaccine Completed      COLONOSCOPY Completed

## 2014-04-18 ENCOUNTER — Ambulatory Visit (INDEPENDENT_AMBULATORY_CARE_PROVIDER_SITE_OTHER): Payer: Medicare HMO | Admitting: Nurse Practitioner

## 2014-04-18 ENCOUNTER — Emergency Department (HOSPITAL_COMMUNITY): Payer: Commercial Managed Care - HMO

## 2014-04-18 ENCOUNTER — Observation Stay (HOSPITAL_COMMUNITY)
Admission: EM | Admit: 2014-04-18 | Discharge: 2014-04-22 | Disposition: A | Discharge status: Home / Self Care | Payer: Commercial Managed Care - HMO | Attending: Internal Medicine | Admitting: Internal Medicine

## 2014-04-18 ENCOUNTER — Encounter (HOSPITAL_COMMUNITY): Payer: Self-pay | Admitting: Emergency Medicine

## 2014-04-18 VITALS — BP 224/94 | Temp 96.9°F | Ht 68.0 in

## 2014-04-18 DIAGNOSIS — Z79899 Other long term (current) drug therapy: Secondary | ICD-10-CM | POA: Insufficient documentation

## 2014-04-18 DIAGNOSIS — E039 Hypothyroidism, unspecified: Secondary | ICD-10-CM | POA: Diagnosis present

## 2014-04-18 DIAGNOSIS — I2510 Atherosclerotic heart disease of native coronary artery without angina pectoris: Secondary | ICD-10-CM | POA: Diagnosis present

## 2014-04-18 DIAGNOSIS — E119 Type 2 diabetes mellitus without complications: Secondary | ICD-10-CM | POA: Diagnosis not present

## 2014-04-18 DIAGNOSIS — I152 Hypertension secondary to endocrine disorders: Secondary | ICD-10-CM | POA: Diagnosis present

## 2014-04-18 DIAGNOSIS — M1990 Unspecified osteoarthritis, unspecified site: Secondary | ICD-10-CM | POA: Insufficient documentation

## 2014-04-18 DIAGNOSIS — Z8673 Personal history of transient ischemic attack (TIA), and cerebral infarction without residual deficits: Secondary | ICD-10-CM | POA: Diagnosis not present

## 2014-04-18 DIAGNOSIS — R55 Syncope and collapse: Secondary | ICD-10-CM | POA: Diagnosis not present

## 2014-04-18 DIAGNOSIS — Z9842 Cataract extraction status, left eye: Secondary | ICD-10-CM | POA: Insufficient documentation

## 2014-04-18 DIAGNOSIS — Z7982 Long term (current) use of aspirin: Secondary | ICD-10-CM | POA: Diagnosis not present

## 2014-04-18 DIAGNOSIS — Z87891 Personal history of nicotine dependence: Secondary | ICD-10-CM | POA: Diagnosis not present

## 2014-04-18 DIAGNOSIS — Z794 Long term (current) use of insulin: Secondary | ICD-10-CM | POA: Diagnosis not present

## 2014-04-18 DIAGNOSIS — K219 Gastro-esophageal reflux disease without esophagitis: Secondary | ICD-10-CM | POA: Insufficient documentation

## 2014-04-18 DIAGNOSIS — Z9841 Cataract extraction status, right eye: Secondary | ICD-10-CM | POA: Diagnosis not present

## 2014-04-18 DIAGNOSIS — E1165 Type 2 diabetes mellitus with hyperglycemia: Secondary | ICD-10-CM

## 2014-04-18 DIAGNOSIS — Z89519 Acquired absence of unspecified leg below knee: Secondary | ICD-10-CM

## 2014-04-18 DIAGNOSIS — I10 Essential (primary) hypertension: Secondary | ICD-10-CM | POA: Diagnosis present

## 2014-04-18 DIAGNOSIS — E1159 Type 2 diabetes mellitus with other circulatory complications: Secondary | ICD-10-CM | POA: Diagnosis present

## 2014-04-18 DIAGNOSIS — I252 Old myocardial infarction: Secondary | ICD-10-CM | POA: Diagnosis not present

## 2014-04-18 DIAGNOSIS — E785 Hyperlipidemia, unspecified: Secondary | ICD-10-CM | POA: Diagnosis not present

## 2014-04-18 DIAGNOSIS — IMO0001 Reserved for inherently not codable concepts without codable children: Secondary | ICD-10-CM

## 2014-04-18 DIAGNOSIS — R7989 Other specified abnormal findings of blood chemistry: Secondary | ICD-10-CM

## 2014-04-18 DIAGNOSIS — E1139 Type 2 diabetes mellitus with other diabetic ophthalmic complication: Secondary | ICD-10-CM

## 2014-04-18 DIAGNOSIS — R5383 Other fatigue: Secondary | ICD-10-CM

## 2014-04-18 LAB — BASIC METABOLIC PANEL
Anion gap: 12 (ref 5–15)
BUN: 17 mg/dL (ref 6–23)
CHLORIDE: 98 meq/L (ref 96–112)
CO2: 25 mEq/L (ref 19–32)
Calcium: 9.2 mg/dL (ref 8.4–10.5)
Creatinine, Ser: 1.39 mg/dL — ABNORMAL HIGH (ref 0.50–1.35)
GFR calc Af Amer: 56 mL/min — ABNORMAL LOW (ref >90)
GFR calc non Af Amer: 48 mL/min — ABNORMAL LOW (ref >90)
Glucose, Bld: 357 mg/dL — ABNORMAL HIGH (ref 70–99)
POTASSIUM: 5.2 meq/L (ref 3.7–5.3)
Sodium: 135 mEq/L — ABNORMAL LOW (ref 137–147)

## 2014-04-18 LAB — GLUCOSE, CAPILLARY: Glucose-Capillary: 289 mg/dL — ABNORMAL HIGH (ref 70–99)

## 2014-04-18 LAB — CBC
HCT: 39.8 % (ref 39.0–52.0)
HEMOGLOBIN: 13.5 g/dL (ref 13.0–17.0)
MCH: 30.7 pg (ref 26.0–34.0)
MCHC: 33.9 g/dL (ref 30.0–36.0)
MCV: 90.5 fL (ref 78.0–100.0)
Platelets: 131 10*3/uL — ABNORMAL LOW (ref 150–400)
RBC: 4.4 MIL/uL (ref 4.22–5.81)
RDW: 13 % (ref 11.5–15.5)
WBC: 7 10*3/uL (ref 4.0–10.5)

## 2014-04-18 LAB — I-STAT TROPONIN, ED
TROPONIN I, POC: 0.01 ng/mL (ref 0.00–0.08)
Troponin i, poc: 0.02 ng/mL (ref 0.00–0.08)

## 2014-04-18 LAB — CBG MONITORING, ED: GLUCOSE-CAPILLARY: 304 mg/dL — AB (ref 70–99)

## 2014-04-18 MED ORDER — ONDANSETRON HCL 4 MG PO TABS: 4.0000 mg | ORAL_TABLET | Freq: Four times a day (QID) | ORAL | Status: DC | PRN

## 2014-04-18 MED ORDER — NORTRIPTYLINE HCL 25 MG PO CAPS
25.0000 mg | ORAL_CAPSULE | Freq: Every day | ORAL | Status: DC
  Administered 2014-04-18 – 2014-04-21 (×4): 25 mg via ORAL
  Filled 2014-04-18 (×4): qty 1

## 2014-04-18 MED ORDER — RAMIPRIL 2.5 MG PO CAPS
2.5000 mg | ORAL_CAPSULE | Freq: Every day | ORAL | Status: DC
  Administered 2014-04-19: 2.5 mg via ORAL
  Filled 2014-04-18 (×2): qty 1

## 2014-04-18 MED ORDER — SODIUM CHLORIDE 0.9 % IJ SOLN
3.0000 mL | Freq: Two times a day (BID) | INTRAMUSCULAR | Status: DC
  Administered 2014-04-18 – 2014-04-20 (×4): 3 mL via INTRAVENOUS

## 2014-04-18 MED ORDER — LEVOTHYROXINE SODIUM 25 MCG PO TABS
125.0000 ug | ORAL_TABLET | Freq: Every day | ORAL | Status: DC
  Administered 2014-04-19 – 2014-04-22 (×4): 125 ug via ORAL
  Filled 2014-04-18 (×9): qty 1

## 2014-04-18 MED ORDER — ACETAMINOPHEN 325 MG PO TABS: 650.0000 mg | ORAL_TABLET | Freq: Four times a day (QID) | ORAL | Status: DC | PRN

## 2014-04-18 MED ORDER — SIMVASTATIN 40 MG PO TABS
40.0000 mg | ORAL_TABLET | Freq: Every day | ORAL | Status: DC
  Administered 2014-04-19 – 2014-04-21 (×3): 40 mg via ORAL
  Filled 2014-04-18 (×4): qty 1

## 2014-04-18 MED ORDER — INSULIN ASPART 100 UNIT/ML ~~LOC~~ SOLN
5.0000 [IU] | Freq: Once | SUBCUTANEOUS | Status: AC
  Administered 2014-04-18: 5 [IU] via SUBCUTANEOUS
  Filled 2014-04-18: qty 1

## 2014-04-18 MED ORDER — ONDANSETRON HCL 4 MG/2ML IJ SOLN: 4.0000 mg | Freq: Four times a day (QID) | INTRAMUSCULAR | Status: DC | PRN

## 2014-04-18 MED ORDER — ACETAMINOPHEN 650 MG RE SUPP: 650.0000 mg | Freq: Four times a day (QID) | RECTAL | Status: DC | PRN

## 2014-04-18 MED ORDER — GABAPENTIN 600 MG PO TABS
600.0000 mg | ORAL_TABLET | Freq: Two times a day (BID) | ORAL | Status: DC
  Administered 2014-04-18 – 2014-04-22 (×8): 600 mg via ORAL
  Filled 2014-04-18 (×9): qty 1

## 2014-04-18 MED ORDER — ENOXAPARIN SODIUM 40 MG/0.4ML ~~LOC~~ SOLN
40.0000 mg | SUBCUTANEOUS | Status: DC
  Administered 2014-04-18 – 2014-04-21 (×4): 40 mg via SUBCUTANEOUS
  Filled 2014-04-18 (×4): qty 0.4

## 2014-04-18 MED ORDER — INSULIN ASPART 100 UNIT/ML ~~LOC~~ SOLN
0.0000 [IU] | Freq: Three times a day (TID) | SUBCUTANEOUS | Status: DC
  Administered 2014-04-19 – 2014-04-20 (×3): 2 [IU] via SUBCUTANEOUS
  Administered 2014-04-20: 1 [IU] via SUBCUTANEOUS
  Administered 2014-04-21: 3 [IU] via SUBCUTANEOUS
  Administered 2014-04-21: 2 [IU] via SUBCUTANEOUS
  Administered 2014-04-22: 1 [IU] via SUBCUTANEOUS

## 2014-04-18 MED ORDER — SODIUM CHLORIDE 0.9 % IV SOLN: INTRAVENOUS | Status: DC

## 2014-04-18 MED ORDER — INSULIN GLARGINE 100 UNIT/ML ~~LOC~~ SOLN
35.0000 [IU] | Freq: Every day | SUBCUTANEOUS | Status: DC
  Administered 2014-04-18 – 2014-04-20 (×3): 35 [IU] via SUBCUTANEOUS
  Filled 2014-04-18 (×4): qty 0.35

## 2014-04-18 MED ORDER — PANTOPRAZOLE SODIUM 40 MG PO TBEC
40.0000 mg | DELAYED_RELEASE_TABLET | Freq: Every day | ORAL | Status: DC
  Administered 2014-04-19 – 2014-04-22 (×4): 40 mg via ORAL
  Filled 2014-04-18 (×4): qty 1

## 2014-04-18 MED ORDER — ASPIRIN EC 81 MG PO TBEC
81.0000 mg | DELAYED_RELEASE_TABLET | Freq: Every day | ORAL | Status: DC
  Administered 2014-04-19 – 2014-04-22 (×4): 81 mg via ORAL
  Filled 2014-04-18 (×8): qty 1

## 2014-04-18 NOTE — Progress Notes (Signed)
   Subjective:    Patient ID: SANTWAN MCNELLY, male    DOB: [birth year], 73 y.o.   MRN: [id number]  HPI Patient in c/o feeling weak, diaphoretic and elevated blood pressures- Has history of CAD with stent palcement in 2002- he denies any chest pain    Review of Systems  Constitutional: Negative.   HENT: Negative.   Respiratory: Positive for shortness of breath.   Cardiovascular: Positive for chest pain.  Neurological: Positive for light-headedness.  Psychiatric/Behavioral: Negative.        Objective:   Physical Exam  Constitutional: He is oriented to person, place, and time. He appears well-developed and well-nourished.  Cardiovascular: Normal rate and regular rhythm.   Murmur (3/6) heard. Pulmonary/Chest: Effort normal and breath sounds normal.  Abdominal: Soft. Bowel sounds are normal.  Neurological: He is alert and oriented to person, place, and time.  Skin: Skin is warm.  Psychiatric: He has a normal mood and affect. His behavior is normal. Judgment and thought content normal.   BP 224/94 mmHg  Temp(Src) 96.9 F (36.1 C) (Oral)  Ht 5\' 8"  (1.727 m)  IV NS KVO- 18g right anticubital- 3:40pm Chewable baby ASA X4 - 3:40pm O2 nonrebreather- 3:39 pm EKG- ST elevation in V3 and V4 B/p-200/93 p 97 - 3:52pm      Assessment & Plan:   1. Other fatigue   2. Diaphoresis    EKG changes in V3 and V4 To [place] via EMS  Mary-Margaret Daphine Deutscher, FNP

## 2014-04-18 NOTE — H&P (Signed)
[facility] History and Physical  Christopher Padilla RUE:[id number] DOB: 03/26/1941 DOA: 04/18/2014  Referring physician: ER physician. PCP: Rudi Heap, MD   Chief Complaint: Loss of consciousness.  HPI: Christopher Padilla is a 73 y.o. male with history of CAD status post CABG, bilateral BKA, hypothyroidism, hyperlipidemia and chronic kidney disease and diabetes mellitus was brought to the ER after patient had a brief episode of loss of consciousness. Patient states that around 3 PM when he was working on his papers he suddenly briefly lost consciousness without any prodromal symptoms. Patient denies any chest pain palpitations shortness of breath nausea vomiting prior to the incident. Patient felt diaphoretic after the incident. He regained his consciousness is within a minute time. Denies any incontinence of urine or tongue bite. Patient did not have a loss of function of upper or lower extremities but did feel generalized weakness. In the ER CT head did not show anything acute and EKG showed sinus rhythm. Patient will be admitted for further management of syncope. Patient states that a few months ago he also had syncopal episode but at that time did not come to the hospital.   Review of Systems: As presented in the history of presenting illness, rest negative.  Past Medical History  Diagnosis Date  . CAD (coronary artery disease)     S/P CABG, Feb 2003, LIMA to the LAD, left free radial artery to an obtuse marginal, spahenous vein graft to diagonal, saphenous vein graft to RCA with endarterectomy  . S/P below knee amputation   . Cerebrovascular disease     MRA in 2005 with 75% stenosis in distal right vertebral artery and 75% stenosis greater in distal cervical internal carotid artery on teh right, severe bilateral disease and MRA of the extracranial circulation, high grade stenosis of the distal right internal carotid artery at the junction of the cervical internal carotid artery of the  skull base  . Diabetes mellitus   . Hyperlipidemia   . Hypothyroidism   . History of tobacco use     Quit in 1990  . Myocardial infarction 2002  . GERD (gastroesophageal reflux disease)   . Arthritis   . Cataract    Past Surgical History  Procedure Laterality Date  . Leg amputation below knee      Bilateral  . Arterial bypass surgry      Left; left femoral to posterior tibial bypass gracting, left femoral artery and deep femoral artery endarterectomy with vein patch angioplasty of the common femoral arter and deep femoral artery 06/2005, right fremoral popliteal bypass, right iliac artery restent, bilateral below knee amputations  . Thoracotomy      With drainage of a hemathroax and decortication of fibrothorax  . Coronary artery bypass graft  2002    By [name], MD  . Cataract extraction      Bilateral  . Carpal tunnel release      Right  . Amputation  10/17/2011    Procedure: AMPUTATION DIGIT;  Surgeon: [name], MD;  Location: [facility] OR;  Service: Orthopedics;  Laterality: Left;  Amputation Left Index Finger at PIP Joint  . Finger surgery Right     pointer   Social History:  reports that he quit smoking about 25 years ago. His smoking use included Cigarettes. He started smoking about 59 years ago. He smoked 1.00 pack per day. He has never used smokeless tobacco. He reports that he does not drink alcohol or use illicit drugs. Where does patient  live at home. Can patient participate in ADLs? Yes.  Allergies  Allergen Reactions  . Codeine     hallucinations  . Ciprofloxacin Diarrhea  . Nsaids Other (See Comments)    GI upset    Family History:  Family History  Problem Relation Age of Onset  . Cancer Mother     brain and lung  . Diabetes Father 5  . Coronary artery disease Father   . Colon cancer Neg Hx   . Diabetes Sister   . Diabetes Brother   . Diabetes Son       Prior to Admission medications   Medication Sig Start Date End Date Taking? Authorizing  Provider  aspirin 81 MG tablet Take 81 mg by mouth daily.     Yes Historical Provider, MD  BD INSULIN SYRINGE ULTRAFINE 31G X 15/64" 0.5 ML MISC USE AS DIRECTED   Yes Ernestina Penna, MD  Cholecalciferol (VITAMIN D PO) Take 1 tablet by mouth 2 (two) times daily.   Yes Historical Provider, MD  gabapentin (NEURONTIN) 600 MG tablet TAKE 1 CAPSULE BY MOUTH THREE TIMES A DAY Patient taking differently: Take 600 mg by mouth 2 (two) times daily.  01/17/14  Yes Ernestina Penna, MD  insulin glargine (LANTUS) 100 UNIT/ML injection INJECT 4O UNITS SUBCUTANEOUSLY AT BEDTIME Patient taking differently: Inject 35 Units into the skin at bedtime.  01/17/14  Yes Ernestina Penna, MD  insulin lispro (HUMALOG) 100 UNIT/ML injection INJECT 0-20 UNITS INTO THE SKIN AS DIRECTED. SLIDING SCALE 01/17/14  Yes Ernestina Penna, MD  levothyroxine (SYNTHROID, LEVOTHROID) 125 MCG tablet TAKE 1 TABLET BY MOUTH DAILY 01/17/14  Yes Ernestina Penna, MD  nortriptyline (PAMELOR) 25 MG capsule TAKE 1 CAPSULE BY MOUTH AT BEDTIME 01/17/14  Yes Ernestina Penna, MD  omeprazole (PRILOSEC) 20 MG capsule TAKE 1 CAPSULE BY MOUTH DAILY 01/17/14  Yes Ernestina Penna, MD  ONE TOUCH ULTRA TEST test strip USE TO TEST SUGAR 4 TIMES DAILY   Yes Ernestina Penna, MD  ramipril (ALTACE) 2.5 MG capsule TAKE 1 CAPSULE BY MOUTH DAILY 01/17/14  Yes Ernestina Penna, MD  simvastatin (ZOCOR) 40 MG tablet Take 1 tablet (40 mg total) by mouth at bedtime. 01/17/14  Yes Ernestina Penna, MD    Physical Exam: Filed Vitals:   04/18/14 1854 04/18/14 1900 04/18/14 1930 04/18/14 2000  BP: 130/88 182/66 170/57 157/54  Pulse: 53 92 76 77  Temp:      TempSrc:      Resp: 19 17 15 15   SpO2: 97% 97% 96% 94%     General:  Well-developed and nourished.  Eyes: Anicteric no pallor.  ENT: No discharge from the ears eyes nose mouth.  Neck: No mass felt.  Cardiovascular: S1 and S2 heard.  Respiratory: No rhonchi or crepitations.  Abdomen: Soft nontender bowel sounds  present.  Skin: No rash.  Musculoskeletal: Bilateral BKA.  Psychiatric: Appears normal.  Neurologic: Alert awake oriented to time place and person. Moves all extremities.  Labs on Admission:  Basic Metabolic Panel:  Recent Labs Lab 04/18/14 1819  NA 135*  K 5.2  CL 98  CO2 25  GLUCOSE 357*  BUN 17  CREATININE 1.39*  CALCIUM 9.2   Liver Function Tests: No results for input(s): AST, ALT, ALKPHOS, BILITOT, PROT, ALBUMIN in the last 168 hours. No results for input(s): LIPASE, AMYLASE in the last 168 hours. No results for input(s): AMMONIA in the last 168 hours. CBC:  Recent Labs  Lab 04/18/14 1819  WBC 7.0  HGB 13.5  HCT 39.8  MCV 90.5  PLT 131*   Cardiac Enzymes: No results for input(s): CKTOTAL, CKMB, CKMBINDEX, TROPONINI in the last 168 hours.  BNP (last 3 results) No results for input(s): PROBNP in the last 8760 hours. CBG:  Recent Labs Lab 04/18/14 1727  GLUCAP 304*    Radiological Exams on Admission: Dg Chest 2 View  04/18/2014   CLINICAL DATA:  Syncope.  EXAM: CHEST  2 VIEW  COMPARISON:  August 30, 2013.  FINDINGS: Stable cardiomediastinal silhouette. Status post coronary artery bypass graft. Stable blunting of left costophrenic sulcus is noted most consistent with scarring. No pneumothorax is noted. No significant pleural effusion is noted. No acute pulmonary disease is noted. Postsurgical changes are again noted involving the left ribs.  IMPRESSION: Postsurgical changes as described above. No acute cardiopulmonary abnormality seen.   Electronically Signed   By: Roque LiasJames  Green M.D.   On: 04/18/2014 18:00   Ct Head Wo Contrast  04/18/2014   CLINICAL DATA:  Syncope  EXAM: CT HEAD WITHOUT CONTRAST  TECHNIQUE: Contiguous axial images were obtained from the base of the skull through the vertex without intravenous contrast.  COMPARISON:  Multiple exams, including 08/30/2003 and 08/29/2003  FINDINGS: Faint hypodensity in the central upper pons and in the  periventricular white matter compatible with chronic ischemic microvascular white matter disease. Remote lacunar infarcts in the right putamen in the and right thalamus. Remote lacunar infarct in the medial left thalamus.  No intracranial hemorrhage, mass lesion, or acute CVA.  There is left pneumoparotid. Mild chronic bilateral maxillary sinusitis. There is atherosclerotic calcification of the cavernous carotid arteries bilaterally. Small right mastoid effusion.  IMPRESSION: 1. No acute intracranial findings. 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 2. Left pneumoparotid. Differential diagnostic considerations include a iatrogenic (recent dental instrumentation, general anesthesia with endotracheal intubation, or spirometry), chronic cough (COPD, cystic fibrosis, allergic rhinitis), and can be seen also in wind instrument musicians, scuba divers, and glass blowers.   Electronically Signed   By: Herbie BaltimoreWalt  Liebkemann M.D.   On: 04/18/2014 17:55    EKG: Independently reviewed. Normal sinus rhythm with old infarcts.  Assessment/Plan Active Problems:   Hypothyroidism   Hx of BKA, bilateral   Syncope   Accelerated hypertension   Diabetes mellitus type 2, uncontrolled   1. Syncope - given the history of ischemic heart disease primary concern is for arrhythmias. Close monitoring telemetry check 2-D echo may need cardiac consult in a.m. Check d-dimer. And if positive will get VQ scan. 2. CAD - denies any chest pain. 3. Uncontrolled hypertension - continue home medication and closely follow blood pressure trends. We'll keep patient on when necessary IV hydralazine. 4. Hypothyroidism - continue Synthroid. Check TSH. 5. Diabetes mellitus type 2 uncontrolled - not sure if patient was taking his Lantus properly. Check hemoglobin A1c. Patient did receive 5 units of NovoLog in the ER. Continue home dose of Lantus and closely follow CBGs. 6. Chronic kidney disease stage III - creatinine appears  to be at baseline. 7. Bilateral BKA.    Code Status: Full code.  Family Communication: None.  Disposition Plan: Admit for observation.    KAKRAKANDY,ARSHAD N. [facility] Pager [phone].  If 7PM-7AM, please contact night-coverage [email or web address] Password [email or web address] 04/18/2014, 9:05 PM

## 2014-04-18 NOTE — ED Provider Notes (Signed)
CSN: [id number]     Arrival date & time 04/18/14  1701 History   First MD Initiated Contact with Patient 04/18/14 1706     Chief Complaint  Patient presents with  . Loss of Consciousness  . Hypertension     (Consider location/radiation/quality/duration/timing/severity/associated sxs/prior Treatment) HPI Patient is a 73 year old male with a history of poorly controlled diabetes presents following a syncopal episode today. The patient was in his usual state of health, reporting that he felt no unusual symptoms this morning, until he had a sudden episode of syncope while sitting in a chair at work. He is unsure how long he lost consciousness. He did not fall or hit his head when he lost consciousness. He reports that he is unsure how long he was unconscious. When he woke up, he said he felt very weak, dizzy, and fatigued. He had some associated chest pain at this time. He was able to drive himself to his doctor's office, where he was found to be hypertensive with systolics in the 220s. He was sent here for further evaluation.  He reports that since leaving the doctor's office, he has begun feeling much better, and now feels completely back to normal. Past Medical History  Diagnosis Date  . CAD (coronary artery disease)     S/P CABG, Feb 2003, LIMA to the LAD, left free radial artery to an obtuse marginal, spahenous vein graft to diagonal, saphenous vein graft to RCA with endarterectomy  . S/P below knee amputation   . Cerebrovascular disease     MRA in 2005 with 75% stenosis in distal right vertebral artery and 75% stenosis greater in distal cervical internal carotid artery on teh right, severe bilateral disease and MRA of the extracranial circulation, high grade stenosis of the distal right internal carotid artery at the junction of the cervical internal carotid artery of the skull base  . Diabetes mellitus   . Hyperlipidemia   . Hypothyroidism   . History of tobacco use     Quit in 1990  .  Myocardial infarction 2002  . GERD (gastroesophageal reflux disease)   . Arthritis   . Cataract    Past Surgical History  Procedure Laterality Date  . Leg amputation below knee      Bilateral  . Arterial bypass surgry      Left; left femoral to posterior tibial bypass gracting, left femoral artery and deep femoral artery endarterectomy with vein patch angioplasty of the common femoral arter and deep femoral artery 06/2005, right fremoral popliteal bypass, right iliac artery restent, bilateral below knee amputations  . Thoracotomy      With drainage of a hemathroax and decortication of fibrothorax  . Coronary artery bypass graft  2002    By Kerin PernaPeter Van Trigt, MD  . Cataract extraction      Bilateral  . Carpal tunnel release      Right  . Amputation  10/17/2011    Procedure: AMPUTATION DIGIT;  Surgeon: Nadara MustardMarcus V Duda, MD;  [facility];  Service: Orthopedics;  Laterality: Left;  Amputation Left Index Finger at PIP Joint  . Finger surgery Right     pointer   Family History  Problem Relation Age of Onset  . Cancer Mother     brain and lung  . Diabetes Father 4760  . Coronary artery disease Father   . Colon cancer Neg Hx   . Diabetes Sister   . Diabetes Brother   . Diabetes Son    History  Substance  Use Topics  . Smoking status: Former Smoker -- 1.00 packs/day    Types: Cigarettes    Start date: 11/04/1954    Quit date: 05/06/1988  . Smokeless tobacco: Never Used  . Alcohol Use: No    Review of Systems    Allergies  Codeine; Ciprofloxacin; and Nsaids  Home Medications   Prior to Admission medications   Medication Sig Start Date End Date Taking? Authorizing Provider  aspirin 81 MG tablet Take 81 mg by mouth daily.     Yes Historical Provider, MD  BD INSULIN SYRINGE ULTRAFINE 31G X 15/64" 0.5 ML MISC USE AS DIRECTED   Yes Ernestina Penna, MD  Cholecalciferol (VITAMIN D PO) Take 1 tablet by mouth 2 (two) times daily.   Yes Historical Provider, MD  gabapentin (NEURONTIN)  600 MG tablet TAKE 1 CAPSULE BY MOUTH THREE TIMES A DAY Patient taking differently: Take 600 mg by mouth 2 (two) times daily.  01/17/14  Yes Ernestina Penna, MD  insulin glargine (LANTUS) 100 UNIT/ML injection INJECT 4O UNITS SUBCUTANEOUSLY AT BEDTIME Patient taking differently: Inject 35 Units into the skin at bedtime.  01/17/14  Yes Ernestina Penna, MD  insulin lispro (HUMALOG) 100 UNIT/ML injection INJECT 0-20 UNITS INTO THE SKIN AS DIRECTED. SLIDING SCALE 01/17/14  Yes Ernestina Penna, MD  levothyroxine (SYNTHROID, LEVOTHROID) 125 MCG tablet TAKE 1 TABLET BY MOUTH DAILY 01/17/14  Yes Ernestina Penna, MD  nortriptyline (PAMELOR) 25 MG capsule TAKE 1 CAPSULE BY MOUTH AT BEDTIME 01/17/14  Yes Ernestina Penna, MD  omeprazole (PRILOSEC) 20 MG capsule TAKE 1 CAPSULE BY MOUTH DAILY 01/17/14  Yes Ernestina Penna, MD  ONE TOUCH ULTRA TEST test strip USE TO TEST SUGAR 4 TIMES DAILY   Yes Ernestina Penna, MD  ramipril (ALTACE) 2.5 MG capsule TAKE 1 CAPSULE BY MOUTH DAILY 01/17/14  Yes Ernestina Penna, MD  simvastatin (ZOCOR) 40 MG tablet Take 1 tablet (40 mg total) by mouth at bedtime. 01/17/14  Yes Ernestina Penna, MD   BP 157/54 mmHg  Pulse 77  Temp(Src) 98.2 F (36.8 C) (Oral)  Resp 15  SpO2 94% Physical Exam  ED Course  Procedures (including critical care time) Labs Review Labs Reviewed  CBC - Abnormal; Notable for the following:    Platelets 131 (*)    All other components within normal limits  BASIC METABOLIC PANEL - Abnormal; Notable for the following:    Sodium 135 (*)    Glucose, Bld 357 (*)    Creatinine, Ser 1.39 (*)    GFR calc non Af Amer 48 (*)    GFR calc Af Amer 56 (*)    All other components within normal limits  CBG MONITORING, ED - Abnormal; Notable for the following:    Glucose-Capillary 304 (*)    All other components within normal limits  I-STAT TROPOININ, ED  Rosezena Sensor, ED    Imaging Review Dg Chest 2 View  04/18/2014   CLINICAL DATA:  Syncope.  EXAM: CHEST  2 VIEW   COMPARISON:  August 30, 2013.  FINDINGS: Stable cardiomediastinal silhouette. Status post coronary artery bypass graft. Stable blunting of left costophrenic sulcus is noted most consistent with scarring. No pneumothorax is noted. No significant pleural effusion is noted. No acute pulmonary disease is noted. Postsurgical changes are again noted involving the left ribs.  IMPRESSION: Postsurgical changes as described above. No acute cardiopulmonary abnormality seen.   Electronically Signed   By: Marry Guan.D.  On: 04/18/2014 18:00   Ct Head Wo Contrast  04/18/2014   CLINICAL DATA:  Syncope  EXAM: CT HEAD WITHOUT CONTRAST  TECHNIQUE: Contiguous axial images were obtained from the base of the skull through the vertex without intravenous contrast.  COMPARISON:  Multiple exams, including 08/30/2003 and 08/29/2003  FINDINGS: Faint hypodensity in the central upper pons and in the periventricular white matter compatible with chronic ischemic microvascular white matter disease. Remote lacunar infarcts in the right putamen in the and right thalamus. Remote lacunar infarct in the medial left thalamus.  No intracranial hemorrhage, mass lesion, or acute CVA.  There is left pneumoparotid. Mild chronic bilateral maxillary sinusitis. There is atherosclerotic calcification of the cavernous carotid arteries bilaterally. Small right mastoid effusion.  IMPRESSION: 1. No acute intracranial findings. 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 2. Left pneumoparotid. Differential diagnostic considerations include a iatrogenic (recent dental instrumentation, general anesthesia with endotracheal intubation, or spirometry), chronic cough (COPD, cystic fibrosis, allergic rhinitis), and can be seen also in wind instrument musicians, scuba divers, and glass blowers.   Electronically Signed   By: Herbie Baltimore M.D.   On: 04/18/2014 17:55     EKG Interpretation   Date/Time:  Monday April 18 2014 17:11:07  EST Ventricular Rate:  86 PR Interval:  186 QRS Duration: 98 QT Interval:  364 QTC Calculation: 435 R Axis:   2 Text Interpretation:  Sinus rhythm Probable left atrial enlargement  Inferior infarct, old Anterior infarct, old Lateral leads are also  involved Probably LVH with repolarization changes which is new since 2007  Confirmed by DOCHERTY  MD, MEGAN [phone]) on 04/18/2014 5:19:32 PM      MDM   Final diagnoses:  Syncope   73 year old male with a history of coronary artery disease and diabetes presents with syncope. Patient had a sudden syncopal episode with no prodromal symptoms concerning for cardiac arrhythmia. He was unconscious for an unknown period of time, and felt ill after waking up. He was significant only hypertensive upon his arrival to his doctor's office, and hypertensive here as well, but gradually improved. His symptoms at all resolved at this point, he has a nonfocal neurologic exam, and it head CT not concerning for any acute bleed or CVA.  Labs reviewed, no laboratory evidence of any etiology of his syncope. We'll plan to admit for observation and telemetry.  Erskine Emery, MD 04/18/14 9604  Toy Cookey, MD 04/21/14 [phone]

## 2014-04-18 NOTE — ED Notes (Signed)
CBG 304 

## 2014-04-18 NOTE — ED Notes (Signed)
Per [employer], pt was at home and experienced a syncopal episode. Drove himself to UC, possible EKG changes by UC. EKG by EMS were unremarkable. Per patient, was sitting in his chair, and he felt like he passed out for a few seconds. Pt states he blacked out "everything went black". Pt did not experience any pain, denies pain at this time. Pt hypertensive by EMS 196/89 PT is AAOX4, in NAD.

## 2014-04-18 NOTE — ED Notes (Signed)
Attempted report 

## 2014-04-18 NOTE — ED Notes (Signed)
Alexes Lannin - [phone] Work number. Wife.

## 2014-04-18 NOTE — ED Notes (Signed)
Pt returned from xray

## 2014-04-19 ENCOUNTER — Observation Stay (HOSPITAL_COMMUNITY): Payer: Commercial Managed Care - HMO

## 2014-04-19 DIAGNOSIS — I059 Rheumatic mitral valve disease, unspecified: Secondary | ICD-10-CM

## 2014-04-19 LAB — COMPREHENSIVE METABOLIC PANEL
ALBUMIN: 3.6 g/dL (ref 3.5–5.2)
ALT: 18 U/L (ref 0–53)
AST: 21 U/L (ref 0–37)
Alkaline Phosphatase: 120 U/L — ABNORMAL HIGH (ref 39–117)
Anion gap: 13 (ref 5–15)
BUN: 16 mg/dL (ref 6–23)
CALCIUM: 9.7 mg/dL (ref 8.4–10.5)
CHLORIDE: 101 meq/L (ref 96–112)
CO2: 26 meq/L (ref 19–32)
Creatinine, Ser: 1.28 mg/dL (ref 0.50–1.35)
GFR calc Af Amer: 61 mL/min — ABNORMAL LOW (ref >90)
GFR calc non Af Amer: 53 mL/min — ABNORMAL LOW (ref >90)
Glucose, Bld: 117 mg/dL — ABNORMAL HIGH (ref 70–99)
Potassium: 4.4 mEq/L (ref 3.7–5.3)
SODIUM: 140 meq/L (ref 137–147)
Total Bilirubin: 0.4 mg/dL (ref 0.3–1.2)
Total Protein: 7.3 g/dL (ref 6.0–8.3)

## 2014-04-19 LAB — HEMOGLOBIN A1C
Hgb A1c MFr Bld: 10.1 % — ABNORMAL HIGH (ref <5.7)
MEAN PLASMA GLUCOSE: 243 mg/dL — AB (ref <117)

## 2014-04-19 LAB — CBC WITH DIFFERENTIAL/PLATELET
Basophils Absolute: 0.1 10*3/uL (ref 0.0–0.1)
Basophils Relative: 1 % (ref 0–1)
EOS ABS: 0.2 10*3/uL (ref 0.0–0.7)
EOS PCT: 4 % (ref 0–5)
HCT: 43.3 % (ref 39.0–52.0)
Hemoglobin: 14.3 g/dL (ref 13.0–17.0)
LYMPHS ABS: 2.4 10*3/uL (ref 0.7–4.0)
Lymphocytes Relative: 35 % (ref 12–46)
MCH: 29.5 pg (ref 26.0–34.0)
MCHC: 33 g/dL (ref 30.0–36.0)
MCV: 89.3 fL (ref 78.0–100.0)
Monocytes Absolute: 0.5 10*3/uL (ref 0.1–1.0)
Monocytes Relative: 8 % (ref 3–12)
Neutro Abs: 3.7 10*3/uL (ref 1.7–7.7)
Neutrophils Relative %: 52 % (ref 43–77)
Platelets: 147 10*3/uL — ABNORMAL LOW (ref 150–400)
RBC: 4.85 MIL/uL (ref 4.22–5.81)
RDW: 13.1 % (ref 11.5–15.5)
WBC: 6.9 10*3/uL (ref 4.0–10.5)

## 2014-04-19 LAB — D-DIMER, QUANTITATIVE (NOT AT ARMC): D DIMER QUANT: 0.56 ug{FEU}/mL — AB (ref 0.00–0.48)

## 2014-04-19 LAB — GLUCOSE, CAPILLARY
Glucose-Capillary: 70 mg/dL (ref 70–99)
Glucose-Capillary: 184 mg/dL — ABNORMAL HIGH (ref 70–99)
Glucose-Capillary: 176 mg/dL — ABNORMAL HIGH (ref 70–99)

## 2014-04-19 LAB — TROPONIN I

## 2014-04-19 LAB — MRSA PCR SCREENING: MRSA by PCR: POSITIVE — AB

## 2014-04-19 LAB — TSH: TSH: 0.423 u[IU]/mL (ref 0.350–4.500)

## 2014-04-19 MED ORDER — MUPIROCIN 2 % EX OINT
1.0000 "application " | TOPICAL_OINTMENT | Freq: Two times a day (BID) | CUTANEOUS | Status: DC
  Administered 2014-04-19 – 2014-04-22 (×7): 1 via NASAL
  Filled 2014-04-19 (×5): qty 22

## 2014-04-19 MED ORDER — HYDRALAZINE HCL 20 MG/ML IJ SOLN: 10.0000 mg | INTRAMUSCULAR | Status: DC | PRN

## 2014-04-19 MED ORDER — CHLORHEXIDINE GLUCONATE CLOTH 2 % EX PADS
6.0000 | MEDICATED_PAD | Freq: Every day | CUTANEOUS | Status: DC
  Administered 2014-04-20 – 2014-04-22 (×3): 6 via TOPICAL

## 2014-04-19 MED ORDER — IOHEXOL 350 MG/ML SOLN
80.0000 mL | Freq: Once | INTRAVENOUS | Status: AC | PRN
  Administered 2014-04-19: 67 mL via INTRAVENOUS

## 2014-04-19 NOTE — Progress Notes (Signed)
Nutrition Brief Note  Patient identified on the Malnutrition Screening Tool (MST) Report for weight loss. Patient reports that he has lost a few pounds over the past month or so due to a bad cold where he lost his appetite and was eating poorly. Patient's appetite is back to normal now and he is eating 100% of his meals, meeting nutrition needs.   Wt Readings from Last 15 Encounters:  04/19/14 143 lb 3.2 oz (64.955 kg)  03/21/14 156 lb (70.761 kg)  02/14/14 160 lb (72.576 kg)  01/17/14 160 lb (72.576 kg)  08/30/13 160 lb (72.576 kg)  05/03/13 159 lb (72.122 kg)  01/26/13 164 lb (74.39 kg)  11/11/12 163 lb (73.936 kg)  10/14/12 165 lb (74.844 kg)  10/08/12 163 lb 3.2 oz (74.027 kg)  10/01/12 160 lb (72.576 kg)  09/14/12 160 lb 12.8 oz (72.938 kg)  10/16/11 164 lb (74.39 kg)  07/03/11 165 lb (74.844 kg)  04/08/11 162 lb (73.483 kg)    Ht Readings from Last 2 Encounters:  04/18/14 5\' 8"  (1.727 m)  04/18/14 5\' 8"  (1.727 m)    Body mass index is 24.7 using equation for amputations. Patient meets criteria for normal weight based on current BMI.   Current diet order is heart healthy CHO-modified, patient is consuming approximately 100% of meals at this time. Labs and medications reviewed.   No nutrition interventions warranted at this time. If nutrition issues arise, please consult RD.    Joaquin Courts, RD, LDN, CNSC Pager [phone] After Hours Pager [phone]

## 2014-04-19 NOTE — Progress Notes (Signed)
Echocardiogram 2D Echocardiogram has been performed.  Christopher Padilla 04/19/2014, 11:30 AM

## 2014-04-19 NOTE — Progress Notes (Signed)
UR completed 

## 2014-04-19 NOTE — Progress Notes (Signed)
[facility] PROGRESS NOTE  Assessment/Plan: Syncope: - no prodromal symptoms. - no events on telemetry, cardiac markers negative x2. - d-dimer +, check ct angio chest, echo pending.  Accelerated hypertension: - continue home medication and closely follow blood pressure trends.  Diabetes mellitus type 2, uncontrolled - cont Lantus + SSI.  Hypothyroidism - cont synthroid.  Chronic kidney disease stage III - creatinine appears to be at baseline.  Code Status: full Family Communication: none  Disposition Plan: observation.   Consultants:  none  Procedures:  CT angio chest  Antibiotics:  None  HPI/Subjective: Had palpitation. No compalins  Objective: Filed Vitals:   04/18/14 2000 04/18/14 2145 04/18/14 2146 04/19/14 0500  BP: 157/54 167/61  149/65  Pulse: 77 76  73  Temp:  99 F (37.2 C)  97.7 F (36.5 C)  TempSrc:      Resp: 15 14  15   Height:  5\' 8"  (1.727 m)    Weight:  70.308 kg (155 lb) 67.541 kg (148 lb 14.4 oz) 64.955 kg (143 lb 3.2 oz)  SpO2: 94% 97%  100%    Intake/Output Summary (Last 24 hours) at 04/19/14 0841 Last data filed at 04/19/14 0830  Gross per 24 hour  Intake    240 ml  Output    500 ml  Net   -260 ml   Filed Weights   04/18/14 2145 04/18/14 2146 04/19/14 0500  Weight: 70.308 kg (155 lb) 67.541 kg (148 lb 14.4 oz) 64.955 kg (143 lb 3.2 oz)    Exam:  General: Alert, awake, oriented x3, in no acute distress.  HEENT: No bruits, no goiter.  Heart: Regular rate and rhythm..  Lungs: Good air movement, bilateral air movement.  Abdomen: Soft, nontender, nondistended, positive bowel sounds.    Data Reviewed: Basic Metabolic Panel:  Recent Labs Lab 04/18/14 1819 04/19/14 0437  NA 135* 140  K 5.2 4.4  CL 98 101  CO2 25 26  GLUCOSE 357* 117*  BUN 17 16  CREATININE 1.39* 1.28  CALCIUM 9.2 9.7   Liver Function Tests:  Recent Labs Lab 04/19/14 0437  AST 21  ALT 18  ALKPHOS 120*  BILITOT 0.4  PROT 7.3    ALBUMIN 3.6   No results for input(s): LIPASE, AMYLASE in the last 168 hours. No results for input(s): AMMONIA in the last 168 hours. CBC:  Recent Labs Lab 04/18/14 1819 04/19/14 0437  WBC 7.0 6.9  NEUTROABS  --  3.7  HGB 13.5 14.3  HCT 39.8 43.3  MCV 90.5 89.3  PLT 131* 147*   Cardiac Enzymes:  Recent Labs Lab 04/18/14 2341  TROPONINI <0.30   BNP (last 3 results) No results for input(s): PROBNP in the last 8760 hours. CBG:  Recent Labs Lab 04/18/14 1727 04/18/14 2152 04/19/14 0734  GLUCAP 304* 289* 70    Recent Results (from the past 240 hour(s))  MRSA PCR Screening     Status: Abnormal   Collection Time: 04/19/14  2:36 AM  Result Value Ref Range Status   MRSA by PCR POSITIVE (A) NEGATIVE Final    Comment:        The GeneXpert MRSA Assay (FDA approved for NASAL specimens only), is one component of a comprehensive MRSA colonization surveillance program. It is not intended to diagnose MRSA infection nor to guide or monitor treatment for MRSA infections. RESULT CALLED TO, READ BACK BY AND VERIFIED WITH: CALLED TO RN Mellody Dance [id number] @0510  THANEY      Studies: Dg Chest  2 View  04/18/2014   CLINICAL DATA:  Syncope.  EXAM: CHEST  2 VIEW  COMPARISON:  August 30, 2013.  FINDINGS: Stable cardiomediastinal silhouette. Status post coronary artery bypass graft. Stable blunting of left costophrenic sulcus is noted most consistent with scarring. No pneumothorax is noted. No significant pleural effusion is noted. No acute pulmonary disease is noted. Postsurgical changes are again noted involving the left ribs.  IMPRESSION: Postsurgical changes as described above. No acute cardiopulmonary abnormality seen.   Electronically Signed   By: Roque LiasJames  Green M.D.   On: 04/18/2014 18:00   Ct Head Wo Contrast  04/18/2014   CLINICAL DATA:  Syncope  EXAM: CT HEAD WITHOUT CONTRAST  TECHNIQUE: Contiguous axial images were obtained from the base of the skull through the vertex  without intravenous contrast.  COMPARISON:  Multiple exams, including 08/30/2003 and 08/29/2003  FINDINGS: Faint hypodensity in the central upper pons and in the periventricular white matter compatible with chronic ischemic microvascular white matter disease. Remote lacunar infarcts in the right putamen in the and right thalamus. Remote lacunar infarct in the medial left thalamus.  No intracranial hemorrhage, mass lesion, or acute CVA.  There is left pneumoparotid. Mild chronic bilateral maxillary sinusitis. There is atherosclerotic calcification of the cavernous carotid arteries bilaterally. Small right mastoid effusion.  IMPRESSION: 1. No acute intracranial findings. 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 2. Left pneumoparotid. Differential diagnostic considerations include a iatrogenic (recent dental instrumentation, general anesthesia with endotracheal intubation, or spirometry), chronic cough (COPD, cystic fibrosis, allergic rhinitis), and can be seen also in wind instrument musicians, scuba divers, and glass blowers.   Electronically Signed   By: Herbie BaltimoreWalt  Liebkemann M.D.   On: 04/18/2014 17:55    Scheduled Meds: . aspirin EC  81 mg Oral Daily  . Chlorhexidine Gluconate Cloth  6 each Topical Q0600  . enoxaparin (LOVENOX) injection  40 mg Subcutaneous Q24H  . gabapentin  600 mg Oral BID  . insulin aspart  0-9 Units Subcutaneous TID WC  . insulin glargine  35 Units Subcutaneous QHS  . levothyroxine  125 mcg Oral QAC breakfast  . mupirocin ointment  1 application Nasal BID  . nortriptyline  25 mg Oral QHS  . pantoprazole  40 mg Oral Daily  . ramipril  2.5 mg Oral Daily  . simvastatin  40 mg Oral QHS  . sodium chloride  3 mL Intravenous Q12H   Continuous Infusions: . sodium chloride       Marinda ElkFELIZ ORTIZ, Shemar Plemmons  [facility] Pager [phone]. If 8PM-8AM, please contact night-coverage at [email or web address], password [email or web address] 04/19/2014, 8:41 AM  LOS: 1 day

## 2014-04-20 DIAGNOSIS — I25810 Atherosclerosis of coronary artery bypass graft(s) without angina pectoris: Secondary | ICD-10-CM

## 2014-04-20 DIAGNOSIS — I255 Ischemic cardiomyopathy: Secondary | ICD-10-CM

## 2014-04-20 DIAGNOSIS — I459 Conduction disorder, unspecified: Secondary | ICD-10-CM

## 2014-04-20 LAB — GLUCOSE, CAPILLARY
GLUCOSE-CAPILLARY: 92 mg/dL (ref 70–99)
GLUCOSE-CAPILLARY: 145 mg/dL — AB (ref 70–99)
Glucose-Capillary: 74 mg/dL (ref 70–99)
Glucose-Capillary: 167 mg/dL — ABNORMAL HIGH (ref 70–99)
Glucose-Capillary: 199 mg/dL — ABNORMAL HIGH (ref 70–99)

## 2014-04-20 MED ORDER — SODIUM CHLORIDE 0.9 % IJ SOLN: 3.0000 mL | INTRAMUSCULAR | Status: DC | PRN

## 2014-04-20 MED ORDER — ASPIRIN 81 MG PO CHEW
81.0000 mg | CHEWABLE_TABLET | ORAL | Status: AC
  Administered 2014-04-21: 81 mg via ORAL
  Filled 2014-04-20: qty 1

## 2014-04-20 MED ORDER — SODIUM CHLORIDE 0.9 % IV SOLN: 250.0000 mL | INTRAVENOUS | Status: DC | PRN

## 2014-04-20 MED ORDER — SODIUM CHLORIDE 0.9 % IJ SOLN
3.0000 mL | Freq: Two times a day (BID) | INTRAMUSCULAR | Status: DC
  Administered 2014-04-20 (×2): 3 mL via INTRAVENOUS

## 2014-04-20 MED ORDER — RAMIPRIL 5 MG PO CAPS
5.0000 mg | ORAL_CAPSULE | Freq: Every day | ORAL | Status: DC
  Administered 2014-04-20: 5 mg via ORAL
  Filled 2014-04-20: qty 1

## 2014-04-20 MED ORDER — CARVEDILOL 3.125 MG PO TABS
3.1250 mg | ORAL_TABLET | Freq: Two times a day (BID) | ORAL | Status: DC
  Administered 2014-04-20 – 2014-04-22 (×5): 3.125 mg via ORAL
  Filled 2014-04-20 (×5): qty 1

## 2014-04-20 NOTE — Progress Notes (Signed)
[facility] PROGRESS NOTE  Assessment/Plan: Syncope and collapse: - no prodromal symptoms. - no events on telemetry, cardiac markers negative x3. - d-dimer +, Ct angio no acute PE. - Echo showed decrease EF, new wall motion abnormality. Consult cardiology.  Chronic systolic heart failure/Ischemic cardiomyopathy h/o CABG > 10 years: - Cont ACE-I, EF 40% start Beta blockers. - ASA and statins.  Accelerated hypertension: - continue home medication and closely follow blood pressure trends.  Diabetes mellitus type 2, uncontrolled: - poorly controlled, A1c high. - great controlled in house. - cont Lantus + SSI.  Hypothyroidism - cont synthroid.  Chronic kidney disease stage III - creatinine appears to be at baseline.  Code Status: full Family Communication: none  Disposition Plan: observation.   Consultants:  none  Procedures:  CT angio chest  Antibiotics:  None  HPI/Subjective: No compalins, no chest or SOB now or with in the past month.  Objective: Filed Vitals:   04/19/14 0500 04/19/14 1011 04/19/14 1438 04/20/14 0501  BP: 149/65 145/48 141/48 157/64  Pulse: 73  68 73  Temp: 97.7 F (36.5 C)  98.2 F (36.8 C) 97.2 F (36.2 C)  TempSrc:   Oral Oral  Resp: 15  18   Height:      Weight: 64.955 kg (143 lb 3.2 oz)   64.864 kg (143 lb)  SpO2: 100%  98% 94%    Intake/Output Summary (Last 24 hours) at 04/20/14 0824 Last data filed at 04/19/14 1728  Gross per 24 hour  Intake    720 ml  Output      0 ml  Net    720 ml   Filed Weights   04/18/14 2146 04/19/14 0500 04/20/14 0501  Weight: 67.541 kg (148 lb 14.4 oz) 64.955 kg (143 lb 3.2 oz) 64.864 kg (143 lb)    Exam:  General: Alert, awake, oriented x3, in no acute distress.  HEENT: No bruits, no goiter.  Heart: Regular rate and rhythm. Lungs: Good air movement, bilateral air movement.  Abdomen: Soft, nontender, nondistended, positive bowel sounds.    Data Reviewed: Basic Metabolic  Panel:  Recent Labs Lab 04/18/14 1819 04/19/14 0437  NA 135* 140  K 5.2 4.4  CL 98 101  CO2 25 26  GLUCOSE 357* 117*  BUN 17 16  CREATININE 1.39* 1.28  CALCIUM 9.2 9.7   Liver Function Tests:  Recent Labs Lab 04/19/14 0437  AST 21  ALT 18  ALKPHOS 120*  BILITOT 0.4  PROT 7.3  ALBUMIN 3.6   No results for input(s): LIPASE, AMYLASE in the last 168 hours. No results for input(s): AMMONIA in the last 168 hours. CBC:  Recent Labs Lab 04/18/14 1819 04/19/14 0437  WBC 7.0 6.9  NEUTROABS  --  3.7  HGB 13.5 14.3  HCT 39.8 43.3  MCV 90.5 89.3  PLT 131* 147*   Cardiac Enzymes:  Recent Labs Lab 04/18/14 2341  TROPONINI <0.30   BNP (last 3 results) No results for input(s): PROBNP in the last 8760 hours. CBG:  Recent Labs Lab 04/19/14 0734 04/19/14 1143 04/19/14 1644 04/20/14 0458 04/20/14 0746  GLUCAP 70 184* 176* 92 74    Recent Results (from the past 240 hour(s))  MRSA PCR Screening     Status: Abnormal   Collection Time: 04/19/14  2:36 AM  Result Value Ref Range Status   MRSA by PCR POSITIVE (A) NEGATIVE Final    Comment:        The GeneXpert MRSA Assay (FDA approved  for NASAL specimens only), is one component of a comprehensive MRSA colonization surveillance program. It is not intended to diagnose MRSA infection nor to guide or monitor treatment for MRSA infections. RESULT CALLED TO, READ BACK BY AND VERIFIED WITH: CALLED TO RN Mellody DanceLLISON DUNDON [phone] @0510  THANEY      Studies: Dg Chest 2 View  04/18/2014   CLINICAL DATA:  Syncope.  EXAM: CHEST  2 VIEW  COMPARISON:  August 30, 2013.  FINDINGS: Stable cardiomediastinal silhouette. Status post coronary artery bypass graft. Stable blunting of left costophrenic sulcus is noted most consistent with scarring. No pneumothorax is noted. No significant pleural effusion is noted. No acute pulmonary disease is noted. Postsurgical changes are again noted involving the left ribs.  IMPRESSION: Postsurgical  changes as described above. No acute cardiopulmonary abnormality seen.   Electronically Signed   By: Roque LiasJames  Green M.D.   On: 04/18/2014 18:00   Ct Head Wo Contrast  04/18/2014   CLINICAL DATA:  Syncope  EXAM: CT HEAD WITHOUT CONTRAST  TECHNIQUE: Contiguous axial images were obtained from the base of the skull through the vertex without intravenous contrast.  COMPARISON:  Multiple exams, including 08/30/2003 and 08/29/2003  FINDINGS: Faint hypodensity in the central upper pons and in the periventricular white matter compatible with chronic ischemic microvascular white matter disease. Remote lacunar infarcts in the right putamen in the and right thalamus. Remote lacunar infarct in the medial left thalamus.  No intracranial hemorrhage, mass lesion, or acute CVA.  There is left pneumoparotid. Mild chronic bilateral maxillary sinusitis. There is atherosclerotic calcification of the cavernous carotid arteries bilaterally. Small right mastoid effusion.  IMPRESSION: 1. No acute intracranial findings. 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 2. Left pneumoparotid. Differential diagnostic considerations include a iatrogenic (recent dental instrumentation, general anesthesia with endotracheal intubation, or spirometry), chronic cough (COPD, cystic fibrosis, allergic rhinitis), and can be seen also in wind instrument musicians, scuba divers, and glass blowers.   Electronically Signed   By: Herbie BaltimoreWalt  Liebkemann M.D.   On: 04/18/2014 17:55   Ct Angio Chest Pe W/cm &/or Wo Cm  04/19/2014   CLINICAL DATA:  Syncopal episode yesterday, positive D-dimer  EXAM: CT ANGIOGRAPHY CHEST WITH CONTRAST  TECHNIQUE: Multidetector CT imaging of the chest was performed using the standard protocol during bolus administration of intravenous contrast. Multiplanar CT image reconstructions and MIPs were obtained to evaluate the vascular anatomy.  CONTRAST:  67mL OMNIPAQUE IOHEXOL 350 MG/ML SOLN  COMPARISON:  None.  FINDINGS:  Sagittal images of the spine shows mild degenerative changes thoracic spine. The patient is status post median sternotomy.  Images of the thoracic inlet are unremarkable. Central airways are patent. Atherosclerotic calcifications of thoracic aorta and coronary arteries are noted. No pericardial effusion. Visualized upper abdomen shows tiny calcified flattening gallstones within gallbladder. No adrenal gland mass is noted in visualized upper abdomen.  The study is of excellent technical quality. No pulmonary embolus is noted.  There is prior left posterior rib resection. Old left rib fracture. Small left pleural effusion with left base pleural thickening. There are diaphragmatic and basilar posterior pleural calcifications on left side. Probable sequela from prior infection or inflammatory process.  There is some atelectasis or scarring in left upper lobe posterior medially along the fissure. No acute infiltrate or pulmonary edema. There is no mediastinal hematoma or adenopathy. No hilar adenopathy. No axillary adenopathy. Status post CABG.  Review of the MIP images confirms the above findings.  IMPRESSION: 1. No pulmonary embolus  is identified. 2. No acute infiltrate or pulmonary edema. Small left pleural effusion. There is pleural thickening and there are left base posterior and left diaphragmatic pleural calcifications probable sequela from prior infection or inflammation. Postsurgical changes left posterior ribs. 3. No mediastinal hematoma or adenopathy. 4. Atherosclerotic calcifications of thoracic aorta and coronary arteries. Status post CABG. 5. Small layering calcified gallstones are noted within gallbladder.   Electronically Signed   By: Natasha Mead M.D.   On: 04/19/2014 10:50    Scheduled Meds: . aspirin EC  81 mg Oral Daily  . Chlorhexidine Gluconate Cloth  6 each Topical Q0600  . enoxaparin (LOVENOX) injection  40 mg Subcutaneous Q24H  . gabapentin  600 mg Oral BID  . insulin aspart  0-9 Units  Subcutaneous TID WC  . insulin glargine  35 Units Subcutaneous QHS  . levothyroxine  125 mcg Oral QAC breakfast  . mupirocin ointment  1 application Nasal BID  . nortriptyline  25 mg Oral QHS  . pantoprazole  40 mg Oral Daily  . ramipril  2.5 mg Oral Daily  . simvastatin  40 mg Oral QHS  . sodium chloride  3 mL Intravenous Q12H   Continuous Infusions: . sodium chloride       Marinda Elk  [facility] Pager [phone]. If 8PM-8AM, please contact night-coverage at [email or web address], password [email or web address] 04/20/2014, 8:24 AM  LOS: 2 days

## 2014-04-20 NOTE — Progress Notes (Signed)
UR completed 

## 2014-04-20 NOTE — Consult Note (Signed)
Reason for Consult: Syncope  Requesting Physician: David Stall  Cardiologist: Antoine Poche  HPI: This is a 73 y.o. male with a past medical history significant for CAD s/p CABG 2003 (LIMA to the LAD, left free radial artery to an obtuse marginal, saphenous vein graft to diagonal, saphenous vein graft to RCA with endarterectomy), old inferior infarction, previously EF 50-55% and nuclear study with inferior scar and small area of apical ischemia, extensive PAD s/p bilateral BKA, carotid and vertebral artery disease, DM, HTN.  Presents with two episodes of unheralded syncope, but without angina or CHF symptoms. Both syncopal events were unheralded and were not associated with a change in position or palpitations or symptoms of hypoglycemia. He was diaphoretic when he woke up. This happened after eating lunch. Hours later, in his MD office, glucose was >300.  ECG is abnormal, but unchanged, Echo shows new LAD territory wall motion abnormality and a reduction in EF to 40-45%. Normal cardiac enzymes. Renal function at baseline (GFR 50-60 mL/min).  PMHx:  Past Medical History  Diagnosis Date  . CAD (coronary artery disease)     S/P CABG, Feb 2003, LIMA to the LAD, left free radial artery to an obtuse marginal, spahenous vein graft to diagonal, saphenous vein graft to RCA with endarterectomy  . S/P below knee amputation   . Cerebrovascular disease     MRA in 2005 with 75% stenosis in distal right vertebral artery and 75% stenosis greater in distal cervical internal carotid artery on teh right, severe bilateral disease and MRA of the extracranial circulation, high grade stenosis of the distal right internal carotid artery at the junction of the cervical internal carotid artery of the skull base  . Diabetes mellitus   . Hyperlipidemia   . Hypothyroidism   . History of tobacco use     Quit in 1990  . Myocardial infarction 2002  . GERD (gastroesophageal reflux disease)   . Arthritis   .  Cataract    Past Surgical History  Procedure Laterality Date  . Leg amputation below knee      Bilateral  . Arterial bypass surgry      Left; left femoral to posterior tibial bypass gracting, left femoral artery and deep femoral artery endarterectomy with vein patch angioplasty of the common femoral arter and deep femoral artery 06/2005, right fremoral popliteal bypass, right iliac artery restent, bilateral below knee amputations  . Thoracotomy      With drainage of a hemathroax and decortication of fibrothorax  . Coronary artery bypass graft  2002    By [name], MD  . Cataract extraction      Bilateral  . Carpal tunnel release      Right  . Amputation  10/17/2011    Procedure: AMPUTATION DIGIT;  Surgeon: [name], MD;  Location[facility] OR;  Service: Orthopedics;  Laterality: Left;  Amputation Left Index Finger at PIP Joint  . Finger surgery Right     pointer    FAMHx: Family History  Problem Relation Age of Onset  . Cancer Mother     brain and lung  . Diabetes Father 56  . Coronary artery disease Father   . Colon cancer Neg Hx   . Diabetes Sister   . Diabetes Brother   . Diabetes Son     SOCHx:  reports that he quit smoking about 25 years ago. His smoking use included Cigarettes. He started smoking about 59 years ago. He smoked 1.00 pack per  day. He has never used smokeless tobacco. He reports that he does not drink alcohol or use illicit drugs.  ALLERGIES: Allergies  Allergen Reactions  . Codeine     hallucinations  . Ciprofloxacin Diarrhea  . Nsaids Other (See Comments)    GI upset     ROS: Constitutional: negative for chills, fevers and postive for post-syncopal diaporesis Eyes: negative Ears, nose, mouth, throat, and face: negative Respiratory: negative for cough, hemoptysis and wheezing Cardiovascular: negative for chest pressure/discomfort, dyspnea and palpitations Gastrointestinal: negative for abdominal pain, constipation, diarrhea, dysphagia,  nausea and vomiting Genitourinary:negative Integument/breast: negative Hematologic/lymphatic: negative for bleeding, easy bruising and lymphadenopathy Musculoskeletal:positive for bilateral leg amputations Neurological: positive for syncope, negative for focal deficits Behavioral/Psych: negative Endocrine: negative Allergic/Immunologic: negative  HOME MEDICATIONS: Prescriptions prior to admission  Medication Sig Dispense Refill Last Dose  . aspirin 81 MG tablet Take 81 mg by mouth daily.     04/18/2014 at Unknown time  . BD INSULIN SYRINGE ULTRAFINE 31G X 15/64" 0.5 ML MISC USE AS DIRECTED 100 each 2 04/17/2014 at Unknown time  . Cholecalciferol (VITAMIN D PO) Take 1 tablet by mouth 2 (two) times daily.   04/18/2014 at Unknown time  . gabapentin (NEURONTIN) 600 MG tablet TAKE 1 CAPSULE BY MOUTH THREE TIMES A DAY (Patient taking differently: Take 600 mg by mouth 2 (two) times daily. ) 270 tablet 2 04/18/2014 at Unknown time  . insulin glargine (LANTUS) 100 UNIT/ML injection INJECT 4O UNITS SUBCUTANEOUSLY AT BEDTIME (Patient taking differently: Inject 35 Units into the skin at bedtime. ) 30 mL 5 04/17/2014 at Unknown time  . insulin lispro (HUMALOG) 100 UNIT/ML injection INJECT 0-20 UNITS INTO THE SKIN AS DIRECTED. SLIDING SCALE 10 mL 5 04/17/2014 at Unknown time  . levothyroxine (SYNTHROID, LEVOTHROID) 125 MCG tablet TAKE 1 TABLET BY MOUTH DAILY 90 tablet 5 04/18/2014 at Unknown time  . nortriptyline (PAMELOR) 25 MG capsule TAKE 1 CAPSULE BY MOUTH AT BEDTIME 90 capsule 3 04/17/2014 at Unknown time  . omeprazole (PRILOSEC) 20 MG capsule TAKE 1 CAPSULE BY MOUTH DAILY 90 capsule 3 04/18/2014 at Unknown time  . ONE TOUCH ULTRA TEST test strip USE TO TEST SUGAR 4 TIMES DAILY 100 each 4 04/17/2014 at Unknown time  . ramipril (ALTACE) 2.5 MG capsule TAKE 1 CAPSULE BY MOUTH DAILY 90 capsule 3 04/18/2014 at Unknown time  . simvastatin (ZOCOR) 40 MG tablet Take 1 tablet (40 mg total) by mouth at  bedtime. 90 tablet 3 04/17/2014 at Unknown time    HOSPITAL MEDICATIONS: I have reviewed the patient's current medications. Scheduled: . aspirin EC  81 mg Oral Daily  . carvedilol  3.125 mg Oral BID WC  . Chlorhexidine Gluconate Cloth  6 each Topical Q0600  . enoxaparin (LOVENOX) injection  40 mg Subcutaneous Q24H  . gabapentin  600 mg Oral BID  . insulin aspart  0-9 Units Subcutaneous TID WC  . insulin glargine  35 Units Subcutaneous QHS  . levothyroxine  125 mcg Oral QAC breakfast  . mupirocin ointment  1 application Nasal BID  . nortriptyline  25 mg Oral QHS  . pantoprazole  40 mg Oral Daily  . ramipril  5 mg Oral Daily  . simvastatin  40 mg Oral QHS  . sodium chloride  3 mL Intravenous Q12H   Continuous: . sodium chloride      VITALS: Blood pressure 157/64, pulse 73, temperature 97.2 F (36.2 C), temperature source Oral, resp. rate 18, height 5\' 8"  (1.727 m), weight 143  lb (64.864 kg), SpO2 94 %.  PHYSICAL EXAM:  General: Alert, oriented x3, no distress Head: no evidence of trauma, PERRL, EOMI, no exophtalmos or lid lag, no myxedema, no xanthelasma; normal ears, nose and oropharynx Neck: normal jugular venous pulsations and no hepatojugular reflux; brisk carotid pulses with faint bilateral delay and no carotid bruits Chest: clear to auscultation, no signs of consolidation by percussion or palpation, normal fremitus, symmetrical and full respiratory excursions Cardiovascular: normal position and quality of the apical impulse, regular rhythm, normal first heart sound and normal second heart sound, no rubs or gallops, no murmur Abdomen: no tenderness or distention, no masses by palpation, no abnormal pulsatility or arterial bruits, normal bowel sounds, no hepatosplenomegaly Extremities: s/p bilateral BKA; no clubbing, cyanosis;  no edema; 2+ right radial, ulnar and brachial pulses, absent left radial (harvested for CABG); 2+ right femoral, 2+ left femoral. Neurological: grossly  nonfocal   LABS  CBC  Recent Labs  04/18/14 1819 04/19/14 0437  WBC 7.0 6.9  NEUTROABS  --  3.7  HGB 13.5 14.3  HCT 39.8 43.3  MCV 90.5 89.3  PLT 131* 147*   Basic Metabolic Panel  Recent Labs  04/18/14 1819 04/19/14 0437  NA 135* 140  K 5.2 4.4  CL 98 101  CO2 25 26  GLUCOSE 357* 117*  BUN 17 16  CREATININE 1.39* 1.28  CALCIUM 9.2 9.7   Liver Function Tests  Recent Labs  04/19/14 0437  AST 21  ALT 18  ALKPHOS 120*  BILITOT 0.4  PROT 7.3  ALBUMIN 3.6   No results for input(s): LIPASE, AMYLASE in the last 72 hours. Cardiac Enzymes  Recent Labs  04/18/14 2341  TROPONINI <0.30   BNP Invalid input(s): POCBNP D-Dimer  Recent Labs  04/18/14 2341  DDIMER 0.56*   Hemoglobin A1C  Recent Labs  04/18/14 2341  HGBA1C 10.1*   Fasting Lipid Panel No results for input(s): CHOL, HDL, LDLCALC, TRIG, CHOLHDL, LDLDIRECT in the last 72 hours. Thyroid Function Tests  Recent Labs  04/18/14 2341  TSH 0.423      IMAGING: Dg Chest 2 View  04/18/2014   CLINICAL DATA:  Syncope.  EXAM: CHEST  2 VIEW  COMPARISON:  August 30, 2013.  FINDINGS: Stable cardiomediastinal silhouette. Status post coronary artery bypass graft. Stable blunting of left costophrenic sulcus is noted most consistent with scarring. No pneumothorax is noted. No significant pleural effusion is noted. No acute pulmonary disease is noted. Postsurgical changes are again noted involving the left ribs.  IMPRESSION: Postsurgical changes as described above. No acute cardiopulmonary abnormality seen.   Electronically Signed   By: Roque Lias M.D.   On: 04/18/2014 18:00   Ct Head Wo Contrast  04/18/2014   CLINICAL DATA:  Syncope  EXAM: CT HEAD WITHOUT CONTRAST  TECHNIQUE: Contiguous axial images were obtained from the base of the skull through the vertex without intravenous contrast.  COMPARISON:  Multiple exams, including 08/30/2003 and 08/29/2003  FINDINGS: Faint hypodensity in the central upper  pons and in the periventricular white matter compatible with chronic ischemic microvascular white matter disease. Remote lacunar infarcts in the right putamen in the and right thalamus. Remote lacunar infarct in the medial left thalamus.  No intracranial hemorrhage, mass lesion, or acute CVA.  There is left pneumoparotid. Mild chronic bilateral maxillary sinusitis. There is atherosclerotic calcification of the cavernous carotid arteries bilaterally. Small right mastoid effusion.  IMPRESSION: 1. No acute intracranial findings. Remote lacunar infarcts in both thalami and the right  putamen. 2. Left pneumoparotid. Differential diagnostic considerations include a iatrogenic (recent dental instrumentation, general anesthesia with endotracheal intubation, or spirometry), chronic cough (COPD, cystic fibrosis, allergic rhinitis), and can be seen also in wind instrument musicians, scuba divers, and glass blowers.   Electronically Signed   By: Herbie BaltimoreWalt  Liebkemann M.D.   On: 04/18/2014 17:55   Ct Angio Chest Pe W/cm &/or Wo Cm  04/19/2014   CLINICAL DATA:  Syncopal episode yesterday, positive D-dimer  EXAM: CT ANGIOGRAPHY CHEST WITH CONTRAST  TECHNIQUE: Multidetector CT imaging of the chest was performed using the standard protocol during bolus administration of intravenous contrast. Multiplanar CT image reconstructions and MIPs were obtained to evaluate the vascular anatomy.  CONTRAST:  67mL OMNIPAQUE IOHEXOL 350 MG/ML SOLN  COMPARISON:  None.  FINDINGS: Sagittal images of the spine shows mild degenerative changes thoracic spine. The patient is status post median sternotomy.  Images of the thoracic inlet are unremarkable. Central airways are patent. Atherosclerotic calcifications of thoracic aorta and coronary arteries are noted. No pericardial effusion. Visualized upper abdomen shows tiny calcified flattening gallstones within gallbladder. No adrenal gland mass is noted in visualized upper abdomen.  The study is of excellent  technical quality. No pulmonary embolus is noted.  There is prior left posterior rib resection. Old left rib fracture. Small left pleural effusion with left base pleural thickening. There are diaphragmatic and basilar posterior pleural calcifications on left side. Probable sequela from prior infection or inflammatory process.  There is some atelectasis or scarring in left upper lobe posterior medially along the fissure. No acute infiltrate or pulmonary edema. There is no mediastinal hematoma or adenopathy. No hilar adenopathy. No axillary adenopathy. Status post CABG.  Review of the MIP images confirms the above findings.  IMPRESSION: 1. No pulmonary embolus is identified. 2. No acute infiltrate or pulmonary edema. Small left pleural effusion. There is pleural thickening and there are left base posterior and left diaphragmatic pleural calcifications probable sequela from prior infection or inflammation. Postsurgical changes left posterior ribs. 3. No mediastinal hematoma or adenopathy. 4. Atherosclerotic calcifications of thoracic aorta and coronary arteries. Status post CABG. 5. Small layering calcified gallstones are noted within gallbladder.   Electronically Signed   By: Natasha MeadLiviu  Pop M.D.   On: 04/19/2014 10:50    ECG: NSR, old inferior MI, left axis, poor R wave progression, lateral ST depression and T wave inversion  TELEMETRY: NSR. Frequent PACs and blocked PACs (with very short pauses), rare PVCs  IMPRESSION: 1. Syncope - possible arrhythmic mechanism 2. Worsening painless ischemic cardiomyopathy with new LAD distribution abnormality 3. No evidence of CHF at this time 4. Extensive CAD and PAD 5. DM  RECOMMENDATION: 1. Coronary angio/bypasses tomorrow (received contrast yesterday for CTA and has mild baseline diabetic nephropathy) 2. Loop recorder implantation for syncope with suspected arrhythmic mechanism. 3. He is not on a beta blocker. For now will avoid this, since hypoglycemia with  unawareness may be another explanation for his syncope. Long term, likely to benefit from carvedilol for CAD and cardiomyopathy  Time Spent Directly with Patient: 45 minutes  Thurmon FairMihai Selby Foisy, MD, [facility] [phone] office [phone] pager   04/20/2014, 11:24 AM

## 2014-04-21 DIAGNOSIS — R55 Syncope and collapse: Secondary | ICD-10-CM

## 2014-04-21 DIAGNOSIS — N183 Chronic kidney disease, stage 3 (moderate): Secondary | ICD-10-CM

## 2014-04-21 DIAGNOSIS — I739 Peripheral vascular disease, unspecified: Secondary | ICD-10-CM

## 2014-04-21 DIAGNOSIS — I2510 Atherosclerotic heart disease of native coronary artery without angina pectoris: Secondary | ICD-10-CM

## 2014-04-21 DIAGNOSIS — N179 Acute kidney failure, unspecified: Secondary | ICD-10-CM

## 2014-04-21 DIAGNOSIS — I10 Essential (primary) hypertension: Secondary | ICD-10-CM

## 2014-04-21 LAB — GLUCOSE, CAPILLARY
GLUCOSE-CAPILLARY: 92 mg/dL (ref 70–99)
GLUCOSE-CAPILLARY: 44 mg/dL — AB (ref 70–99)
GLUCOSE-CAPILLARY: 66 mg/dL — AB (ref 70–99)
GLUCOSE-CAPILLARY: 155 mg/dL — AB (ref 70–99)
Glucose-Capillary: 134 mg/dL — ABNORMAL HIGH (ref 70–99)
Glucose-Capillary: 211 mg/dL — ABNORMAL HIGH (ref 70–99)
Glucose-Capillary: 109 mg/dL — ABNORMAL HIGH (ref 70–99)

## 2014-04-21 LAB — BASIC METABOLIC PANEL
Anion gap: 12 (ref 5–15)
BUN: 28 mg/dL — AB (ref 6–23)
CHLORIDE: 98 meq/L (ref 96–112)
CO2: 26 mEq/L (ref 19–32)
Calcium: 9.4 mg/dL (ref 8.4–10.5)
Creatinine, Ser: 1.85 mg/dL — ABNORMAL HIGH (ref 0.50–1.35)
GFR calc Af Amer: 39 mL/min — ABNORMAL LOW (ref >90)
GFR calc non Af Amer: 34 mL/min — ABNORMAL LOW (ref >90)
GLUCOSE: 185 mg/dL — AB (ref 70–99)
POTASSIUM: 4.7 meq/L (ref 3.7–5.3)
Sodium: 136 mEq/L — ABNORMAL LOW (ref 137–147)

## 2014-04-21 LAB — PROTIME-INR
INR: 0.97 (ref 0.00–1.49)
Prothrombin Time: 13 seconds (ref 11.6–15.2)

## 2014-04-21 MED ORDER — INSULIN GLARGINE 100 UNIT/ML ~~LOC~~ SOLN
10.0000 [IU] | Freq: Every day | SUBCUTANEOUS | Status: DC
  Administered 2014-04-21: 10 [IU] via SUBCUTANEOUS
  Filled 2014-04-21 (×2): qty 0.1

## 2014-04-21 MED ORDER — SODIUM CHLORIDE 0.9 % IV SOLN: INTRAVENOUS | Status: AC

## 2014-04-21 MED ORDER — SODIUM CHLORIDE 0.9 % IJ SOLN: 3.0000 mL | INTRAMUSCULAR | Status: DC | PRN

## 2014-04-21 MED ORDER — SODIUM CHLORIDE 0.9 % IV BOLUS (SEPSIS)
500.0000 mL | Freq: Once | INTRAVENOUS | Status: AC
  Administered 2014-04-21: 500 mL via INTRAVENOUS

## 2014-04-21 MED ORDER — SODIUM CHLORIDE 0.9 % IV SOLN
1.0000 mL/kg/h | INTRAVENOUS | Status: DC
  Administered 2014-04-22: 1 mL/kg/h via INTRAVENOUS

## 2014-04-21 MED ORDER — SODIUM CHLORIDE 0.9 % IV SOLN: 250.0000 mL | INTRAVENOUS | Status: DC | PRN

## 2014-04-21 MED ORDER — SODIUM CHLORIDE 0.9 % IJ SOLN: 3.0000 mL | Freq: Two times a day (BID) | INTRAMUSCULAR | Status: DC

## 2014-04-21 NOTE — Progress Notes (Signed)
[facility] PROGRESS NOTE  Assessment/Plan: Syncope and collapse: - No prodromal symptoms. - No events on telemetry, cardiac markers negative x3. - d-dimer +, Ct angio no acute PE. - Echo showed decrease EF, new wall motion abnormality.  - Appreciate cardiology assistance. - Mild increase in cr,   Chronic systolic heart failure/Ischemic cardiomyopathy h/o CABG > 10 years: - Cont ACE-I, EF 40% start Beta blockers. - ASA and statins.  AKI: - With recent exposure to contrast in the setting of ACE-I and DM. - Most likely contrast induce nephropathy. - NS bolus Bolus BP medications. - b-met in am. Liberalize his fluids.  Accelerated hypertension: - Continue home medication and closely follow blood pressure trends.  Diabetes mellitus type 2, uncontrolled: - poorly controlled, A1c high. - great controlled in house. - cont Lantus + SSI.  Hypothyroidism - cont synthroid.  Chronic kidney disease stage III - creatinine appears to be at baseline.  Code Status: full Family Communication: none  Disposition Plan: observation.   Consultants:  none  Procedures:  CT angio chest  Antibiotics:  None  HPI/Subjective: No complains.  Objective: Filed Vitals:   04/20/14 0501 04/20/14 1631 04/20/14 2113 04/21/14 0617  BP: 157/64 96/53 135/42 115/48  Pulse: 73 66 65 62  Temp: 97.2 F (36.2 C) 97 F (36.1 C) 97 F (36.1 C) 97.2 F (36.2 C)  TempSrc: Oral Oral Oral Oral  Resp:  18 18 18   Height:      Weight: 64.864 kg (143 lb)   65.817 kg (145 lb 1.6 oz)  SpO2: 94% 93% 99% 96%    Intake/Output Summary (Last 24 hours) at 04/21/14 0936 Last data filed at 04/20/14 2104  Gross per 24 hour  Intake      3 ml  Output      0 ml  Net      3 ml   Filed Weights   04/19/14 0500 04/20/14 0501 04/21/14 0617  Weight: 64.955 kg (143 lb 3.2 oz) 64.864 kg (143 lb) 65.817 kg (145 lb 1.6 oz)    Exam:  General: Alert, awake, oriented x3, in no acute distress.  HEENT:  No bruits, no goiter.  Heart: Regular rate and rhythm. Lungs: Good air movement, bilateral air movement.  Abdomen: Soft, nontender, nondistended, positive bowel sounds.    Data Reviewed: Basic Metabolic Panel:  Recent Labs Lab 04/18/14 1819 04/19/14 0437 04/21/14 0512  NA 135* 140 136*  K 5.2 4.4 4.7  CL 98 101 98  CO2 25 26 26   GLUCOSE 357* 117* 185*  BUN 17 16 28*  CREATININE 1.39* 1.28 1.85*  CALCIUM 9.2 9.7 9.4   Liver Function Tests:  Recent Labs Lab 04/19/14 0437  AST 21  ALT 18  ALKPHOS 120*  BILITOT 0.4  PROT 7.3  ALBUMIN 3.6   No results for input(s): LIPASE, AMYLASE in the last 168 hours. No results for input(s): AMMONIA in the last 168 hours. CBC:  Recent Labs Lab 04/18/14 1819 04/19/14 0437  WBC 7.0 6.9  NEUTROABS  --  3.7  HGB 13.5 14.3  HCT 39.8 43.3  MCV 90.5 89.3  PLT 131* 147*   Cardiac Enzymes:  Recent Labs Lab 04/18/14 2341  TROPONINI <0.30   BNP (last 3 results) No results for input(s): PROBNP in the last 8760 hours. CBG:  Recent Labs Lab 04/20/14 2111 04/21/14 0732 04/21/14 0834 04/21/14 0852 04/21/14 0917  GLUCAP 199* 92 44* 66* 134*    Recent Results (from the past 240 hour(s))  MRSA PCR Screening     Status: Abnormal   Collection Time: 04/19/14  2:36 AM  Result Value Ref Range Status   MRSA by PCR POSITIVE (A) NEGATIVE Final    Comment:        The GeneXpert MRSA Assay (FDA approved for NASAL specimens only), is one component of a comprehensive MRSA colonization surveillance program. It is not intended to diagnose MRSA infection nor to guide or monitor treatment for MRSA infections. RESULT CALLED TO, READ BACK BY AND VERIFIED WITH: CALLED TO RN Johny Shock [phone] @0510  THANEY      Studies: Ct Angio Chest Pe W/cm &/or Wo Cm  04/19/2014   CLINICAL DATA:  Syncopal episode yesterday, positive D-dimer  EXAM: CT ANGIOGRAPHY CHEST WITH CONTRAST  TECHNIQUE: Multidetector CT imaging of the chest was  performed using the standard protocol during bolus administration of intravenous contrast. Multiplanar CT image reconstructions and MIPs were obtained to evaluate the vascular anatomy.  CONTRAST:  58m OMNIPAQUE IOHEXOL 350 MG/ML SOLN  COMPARISON:  None.  FINDINGS: Sagittal images of the spine shows mild degenerative changes thoracic spine. The patient is status post median sternotomy.  Images of the thoracic inlet are unremarkable. Central airways are patent. Atherosclerotic calcifications of thoracic aorta and coronary arteries are noted. No pericardial effusion. Visualized upper abdomen shows tiny calcified flattening gallstones within gallbladder. No adrenal gland mass is noted in visualized upper abdomen.  The study is of excellent technical quality. No pulmonary embolus is noted.  There is prior left posterior rib resection. Old left rib fracture. Small left pleural effusion with left base pleural thickening. There are diaphragmatic and basilar posterior pleural calcifications on left side. Probable sequela from prior infection or inflammatory process.  There is some atelectasis or scarring in left upper lobe posterior medially along the fissure. No acute infiltrate or pulmonary edema. There is no mediastinal hematoma or adenopathy. No hilar adenopathy. No axillary adenopathy. Status post CABG.  Review of the MIP images confirms the above findings.  IMPRESSION: 1. No pulmonary embolus is identified. 2. No acute infiltrate or pulmonary edema. Small left pleural effusion. There is pleural thickening and there are left base posterior and left diaphragmatic pleural calcifications probable sequela from prior infection or inflammation. Postsurgical changes left posterior ribs. 3. No mediastinal hematoma or adenopathy. 4. Atherosclerotic calcifications of thoracic aorta and coronary arteries. Status post CABG. 5. Small layering calcified gallstones are noted within gallbladder.   Electronically Signed   By: LLahoma CrockerM.D.   On: 04/19/2014 10:50    Scheduled Meds: . aspirin EC  81 mg Oral Daily  . carvedilol  3.125 mg Oral BID WC  . Chlorhexidine Gluconate Cloth  6 each Topical Q0600  . enoxaparin (LOVENOX) injection  40 mg Subcutaneous Q24H  . gabapentin  600 mg Oral BID  . insulin aspart  0-9 Units Subcutaneous TID WC  . insulin glargine  10 Units Subcutaneous QHS  . levothyroxine  125 mcg Oral QAC breakfast  . mupirocin ointment  1 application Nasal BID  . nortriptyline  25 mg Oral QHS  . pantoprazole  40 mg Oral Daily  . simvastatin  40 mg Oral QHS  . sodium chloride  3 mL Intravenous Q12H  . sodium chloride  3 mL Intravenous Q12H   Continuous Infusions: . sodium chloride       FCharlynne Cousins [facility] Pager [phone] If 8PM-8AM, please contact night-coverage at [email or web address], password [email or web address] [facility]12/17/2015, 9:36 AM  LOS: 3 days

## 2014-04-21 NOTE — Progress Notes (Signed)
UR completed 

## 2014-04-21 NOTE — Progress Notes (Signed)
Hypoglycemic Event  CBG: 66  Treatment: pt received breakfast tray  Symptoms: weakness  Follow-up CBG: Time:0917 CBG Result:134  Possible Reasons for Event: Lantus dose at bedtime and pt being NPO for cardiac cath  Comments/MD notified:Dr. Micheline Maze  Remember to initiate Hypoglycemia Order Set & complete

## 2014-04-21 NOTE — Addendum Note (Signed)
Addended by: Tamera Punt on: 04/21/2014 11:36 AM   Modules accepted: Orders

## 2014-04-21 NOTE — Progress Notes (Addendum)
Hypoglycemic Event  CBG: 44  Treatment: 4oz juice  Symptoms: dizzy and weak  Follow-up CBG: [id number] CBG Result:66  Possible Reasons for Event: NPO for cardiac cath  Comments/MD notified:Dr. Micheline Maze  Remember to initiate Hypoglycemia Order Set & complete

## 2014-04-21 NOTE — Progress Notes (Addendum)
TELEMETRY: Reviewed telemetry pt in NSR, occ sinus pause versus blocked PAC: Filed Vitals:   04/20/14 0501 04/20/14 1631 04/20/14 2113 04/21/14 0617  BP: 157/64 96/53 135/42 115/48  Pulse: 73 66 65 62  Temp: 97.2 F (36.2 C) 97 F (36.1 C) 97 F (36.1 C) 97.2 F (36.2 C)  TempSrc: Oral Oral Oral Oral  Resp:  18 18 18   Height:      Weight: 143 lb (64.864 kg)   145 lb 1.6 oz (65.817 kg)  SpO2: 94% 93% 99% 96%    Intake/Output Summary (Last 24 hours) at 04/21/14 0755 Last data filed at 04/20/14 2104  Gross per 24 hour  Intake    243 ml  Output      0 ml  Net    243 ml   Filed Weights   04/19/14 0500 04/20/14 0501 04/21/14 0617  Weight: 143 lb 3.2 oz (64.955 kg) 143 lb (64.864 kg) 145 lb 1.6 oz (65.817 kg)    Subjective Feels well. No dizziness, chest pain, or SOB.   Marland Kitchen aspirin EC  81 mg Oral Daily  . carvedilol  3.125 mg Oral BID WC  . Chlorhexidine Gluconate Cloth  6 each Topical Q0600  . enoxaparin (LOVENOX) injection  40 mg Subcutaneous Q24H  . gabapentin  600 mg Oral BID  . insulin aspart  0-9 Units Subcutaneous TID WC  . insulin glargine  35 Units Subcutaneous QHS  . levothyroxine  125 mcg Oral QAC breakfast  . mupirocin ointment  1 application Nasal BID  . nortriptyline  25 mg Oral QHS  . pantoprazole  40 mg Oral Daily  . ramipril  5 mg Oral Daily  . simvastatin  40 mg Oral QHS  . sodium chloride  3 mL Intravenous Q12H  . sodium chloride  3 mL Intravenous Q12H   . sodium chloride      LABS: Basic Metabolic Panel:  Recent Labs  09/81/19 0437 04/21/14 0512  NA 140 136*  K 4.4 4.7  CL 101 98  CO2 26 26  GLUCOSE 117* 185*  BUN 16 28*  CREATININE 1.28 1.85*  CALCIUM 9.7 9.4   Liver Function Tests:  Recent Labs  04/19/14 0437  AST 21  ALT 18  ALKPHOS 120*  BILITOT 0.4  PROT 7.3  ALBUMIN 3.6   No results for input(s): LIPASE, AMYLASE in the last 72 hours. CBC:  Recent Labs  04/18/14 1819 04/19/14 0437  WBC 7.0 6.9  NEUTROABS  --   3.7  HGB 13.5 14.3  HCT 39.8 43.3  MCV 90.5 89.3  PLT 131* 147*   Cardiac Enzymes:  Recent Labs  04/18/14 2341  TROPONINI <0.30   BNP: No results for input(s): PROBNP in the last 72 hours. D-Dimer:  Recent Labs  04/18/14 2341  DDIMER 0.56*   Hemoglobin A1C:  Recent Labs  04/18/14 2341  HGBA1C 10.1*   Fasting Lipid Panel: No results for input(s): CHOL, HDL, LDLCALC, TRIG, CHOLHDL, LDLDIRECT in the last 72 hours. Thyroid Function Tests:  Recent Labs  04/18/14 2341  TSH 0.423     Radiology/Studies:  Ct Angio Chest Pe W/cm &/or Wo Cm  04/19/2014   CLINICAL DATA:  Syncopal episode yesterday, positive D-dimer  EXAM: CT ANGIOGRAPHY CHEST WITH CONTRAST  TECHNIQUE: Multidetector CT imaging of the chest was performed using the standard protocol during bolus administration of intravenous contrast. Multiplanar CT image reconstructions and MIPs were obtained to evaluate the vascular anatomy.  CONTRAST:  67mL OMNIPAQUE IOHEXOL 350  MG/ML SOLN  COMPARISON:  None.  FINDINGS: Sagittal images of the spine shows mild degenerative changes thoracic spine. The patient is status post median sternotomy.  Images of the thoracic inlet are unremarkable. Central airways are patent. Atherosclerotic calcifications of thoracic aorta and coronary arteries are noted. No pericardial effusion. Visualized upper abdomen shows tiny calcified flattening gallstones within gallbladder. No adrenal gland mass is noted in visualized upper abdomen.  The study is of excellent technical quality. No pulmonary embolus is noted.  There is prior left posterior rib resection. Old left rib fracture. Small left pleural effusion with left base pleural thickening. There are diaphragmatic and basilar posterior pleural calcifications on left side. Probable sequela from prior infection or inflammatory process.  There is some atelectasis or scarring in left upper lobe posterior medially along the fissure. No acute infiltrate or  pulmonary edema. There is no mediastinal hematoma or adenopathy. No hilar adenopathy. No axillary adenopathy. Status post CABG.  Review of the MIP images confirms the above findings.  IMPRESSION: 1. No pulmonary embolus is identified. 2. No acute infiltrate or pulmonary edema. Small left pleural effusion. There is pleural thickening and there are left base posterior and left diaphragmatic pleural calcifications probable sequela from prior infection or inflammation. Postsurgical changes left posterior ribs. 3. No mediastinal hematoma or adenopathy. 4. Atherosclerotic calcifications of thoracic aorta and coronary arteries. Status post CABG. 5. Small layering calcified gallstones are noted within gallbladder.   Electronically Signed   By: Natasha Mead M.D.   On: 04/19/2014 10:50   Echo: 04/19/14: Study Conclusions  - Left ventricle: The cavity size was normal. Wall thickness was normal. Systolic function was mildly to moderately reduced. The estimated ejection fraction was in the range of 40% to 45%. There is hypokinesis of the mid-apicalanteroseptal and apical myocardium. Doppler parameters are consistent with abnormal left ventricular relaxation (grade 1 diastolic dysfunction). Doppler parameters are consistent with high ventricular filling pressure. - Aortic valve: There was mild regurgitation. - Mitral valve: There was moderate regurgitation. - Tricuspid valve: There was moderate regurgitation. - Pulmonary arteries: Systolic pressure was mildly to moderately increased. PA peak pressure: 40 mm Hg (S).  Impressions:  - Hypokinesis of the mid-distal anteroseptal and apical myocardium with mild to moderate reduction in LV function; grade 1 diastolic dysfunction with elevated LV filling pressures; mild AI; moderate MR; moderate, eccentric TR with mild to moderate elevation in pulmonary pressures.  Ecg: 04/18/14: NSR with old anterior and inferior infarcts. Lateral ST-T  changes.   PHYSICAL EXAM General: Well developed, well nourished, in no acute distress. Head: Normocephalic, atraumatic, sclera non-icteric, oropharynx is clear Neck: Negative for carotid bruits. JVD not elevated. No adenopathy Lungs: Clear bilaterally to auscultation without wheezes, rales, or rhonchi. Breathing is unlabored. Heart: RRR S1 S2 without murmurs, rubs, or gallops.  Abdomen: Soft, non-tender, non-distended with normoactive bowel sounds. No hepatomegaly. No rebound/guarding. No obvious abdominal masses. Msk:  S/p bilateral BKA Extremities: No clubbing, cyanosis or edema.  Left femoral pulse is palpable. Absent left radial and right femoral pulse.  Neuro: Alert and oriented X 3. Moves all extremities spontaneously. Psych:  Responds to questions appropriately with a normal affect.  ASSESSMENT AND PLAN: 1. Syncope. Etiology is unclear. No significant arrhythmia noted. ? Hypoglycemia with glucose down to 70 last pm. New wall motion abnormality noted on Echo. Planned for cardiac cath today but this will need to be postponed due to increased creatinine post IV contrast for CT chest. Will hydrate tonight and tentatively schedule for  tomorrow. Plan loop recorder. Will discuss with EP. 2. Ischemic cardiomyopathy. EF 40-45%. No evidence of volume overload. On low dose carvedilol. Hold ACEi/ARB due to acute on CKD.  3. CAD s/p CABG in 2003. When able to do cardiac cath will plan left femoral approach. No pulse on right femoral and left radial artery used previously for graft.  4. PAD s/p bilateral BKA.  5. Acute on CKD stage 3. Creatinine increased post CT. Baseline 1.3 now 1.85. Will hydrate this pm and recheck in am. 6. DM type 2 on insulin.  7. HL 8. Cerebrovascular disease.   Present on Admission:  . Syncope . Accelerated hypertension . Diabetes mellitus type 2, uncontrolled . Hypothyroidism . Coronary atherosclerosis . Hyperlipidemia LDL goal <70  Signed, Merryn Thaker [place],  MDFACC 04/21/2014 7:55 AM

## 2014-04-22 ENCOUNTER — Other Ambulatory Visit: Payer: Self-pay | Admitting: Physician Assistant

## 2014-04-22 DIAGNOSIS — N189 Chronic kidney disease, unspecified: Secondary | ICD-10-CM

## 2014-04-22 DIAGNOSIS — E1339 Other specified diabetes mellitus with other diabetic ophthalmic complication: Secondary | ICD-10-CM

## 2014-04-22 DIAGNOSIS — Z89529 Acquired absence of unspecified knee: Secondary | ICD-10-CM

## 2014-04-22 LAB — BASIC METABOLIC PANEL
ANION GAP: 10 (ref 5–15)
BUN: 27 mg/dL — ABNORMAL HIGH (ref 6–23)
CALCIUM: 9.3 mg/dL (ref 8.4–10.5)
CO2: 28 mEq/L (ref 19–32)
Chloride: 102 mEq/L (ref 96–112)
Creatinine, Ser: 1.67 mg/dL — ABNORMAL HIGH (ref 0.50–1.35)
GFR calc Af Amer: 45 mL/min — ABNORMAL LOW (ref >90)
GFR calc non Af Amer: 38 mL/min — ABNORMAL LOW (ref >90)
GLUCOSE: 175 mg/dL — AB (ref 70–99)
POTASSIUM: 5 meq/L (ref 3.7–5.3)
SODIUM: 140 meq/L (ref 137–147)

## 2014-04-22 LAB — GLUCOSE, CAPILLARY: Glucose-Capillary: 130 mg/dL — ABNORMAL HIGH (ref 70–99)

## 2014-04-22 MED ORDER — RAMIPRIL 2.5 MG PO CAPS: ORAL_CAPSULE | ORAL | Status: DC

## 2014-04-22 MED ORDER — CARVEDILOL 3.125 MG PO TABS: 3.1250 mg | ORAL_TABLET | Freq: Two times a day (BID) | ORAL | Status: DC

## 2014-04-22 MED ORDER — SODIUM POLYSTYRENE SULFONATE 15 GM/60ML PO SUSP
30.0000 g | Freq: Once | ORAL | Status: AC
  Administered 2014-04-22: 30 g via ORAL
  Filled 2014-04-22: qty 120

## 2014-04-22 NOTE — Progress Notes (Addendum)
TELEMETRY: Reviewed telemetry pt in NSR, one 4 beat run of NSVT: Filed Vitals:   04/21/14 1653 04/21/14 1712 04/21/14 2107 04/22/14 0702  BP: 120/33 134/41 117/50 154/55  Pulse: 64  71 71  Temp: 97.4 F (36.3 C)  98.6 F (37 C) 97.5 F (36.4 C)  TempSrc: Oral  Oral Oral  Resp: 18  18 18   Height:      Weight:    153 lb 11.2 oz (69.718 kg)  SpO2: 97%  98% 94%   No intake or output data in the 24 hours ending 04/22/14 0803 Filed Weights   04/20/14 0501 04/21/14 0617 04/22/14 0702  Weight: 143 lb (64.864 kg) 145 lb 1.6 oz (65.817 kg) 153 lb 11.2 oz (69.718 kg)    Subjective Feels well. No dizziness, chest pain, or SOB. Some hypoglycemia noted.  Marland Kitchen aspirin EC  81 mg Oral Daily  . carvedilol  3.125 mg Oral BID WC  . Chlorhexidine Gluconate Cloth  6 each Topical Q0600  . enoxaparin (LOVENOX) injection  40 mg Subcutaneous Q24H  . gabapentin  600 mg Oral BID  . insulin aspart  0-9 Units Subcutaneous TID WC  . insulin glargine  10 Units Subcutaneous QHS  . levothyroxine  125 mcg Oral QAC breakfast  . mupirocin ointment  1 application Nasal BID  . nortriptyline  25 mg Oral QHS  . pantoprazole  40 mg Oral Daily  . simvastatin  40 mg Oral QHS  . sodium chloride  3 mL Intravenous Q12H   . sodium chloride 1 mL/kg/hr (04/22/14 0253)  . sodium chloride Stopped (04/21/14 2041)    LABS: Basic Metabolic Panel:  Recent Labs  39/53/20 0512 04/22/14 0419  NA 136* 140  K 4.7 5.0  CL 98 102  CO2 26 28  GLUCOSE 185* 175*  BUN 28* 27*  CREATININE 1.85* 1.67*  CALCIUM 9.4 9.3     Radiology/Studies:  No results found. Echo: 04/19/14: Study Conclusions  - Left ventricle: The cavity size was normal. Wall thickness was normal. Systolic function was mildly to moderately reduced. The estimated ejection fraction was in the range of 40% to 45%. There is hypokinesis of the mid-apicalanteroseptal and apical myocardium. Doppler parameters are consistent with abnormal  left ventricular relaxation (grade 1 diastolic dysfunction). Doppler parameters are consistent with high ventricular filling pressure. - Aortic valve: There was mild regurgitation. - Mitral valve: There was moderate regurgitation. - Tricuspid valve: There was moderate regurgitation. - Pulmonary arteries: Systolic pressure was mildly to moderately increased. PA peak pressure: 40 mm Hg (S).  Impressions:  - Hypokinesis of the mid-distal anteroseptal and apical myocardium with mild to moderate reduction in LV function; grade 1 diastolic dysfunction with elevated LV filling pressures; mild AI; moderate MR; moderate, eccentric TR with mild to moderate elevation in pulmonary pressures.  Ecg: 04/18/14: NSR with old anterior and inferior infarcts. Lateral ST-T changes.   PHYSICAL EXAM General: Well developed, well nourished, in no acute distress. Head: Normocephalic, atraumatic, sclera non-icteric, oropharynx is clear Neck: Negative for carotid bruits. JVD not elevated. No adenopathy Lungs: Clear bilaterally to auscultation without wheezes, rales, or rhonchi. Breathing is unlabored. Heart: RRR S1 S2 without murmurs, rubs, or gallops.  Abdomen: Soft, non-tender, non-distended with normoactive bowel sounds. No hepatomegaly. No rebound/guarding. No obvious abdominal masses. Msk:  S/p bilateral BKA Extremities: No clubbing, cyanosis or edema.  Left femoral pulse is palpable. Absent left radial and right femoral pulse.  Neuro: Alert and oriented X 3. Moves all extremities spontaneously.  Psych:  Responds to questions appropriately with a normal affect.  ASSESSMENT AND PLAN: 1. Syncope. Etiology is unclear. No significant arrhythmia noted. One 4 beat run of NSVT. ? Hypoglycemia with glucose down to 44 last pm. New wall motion abnormality noted on Echo. Planned for cardiac cath today but creatinine is still elevated from baseline post CT chest. Patient at high risk for contrast induced  nephropathy due to extensive PAD, DM, and CKD. I think it would be best to postpone cardiac cath until renal function has returned to baseline 1.4. We could keep him here over the weekend or let him go home and recheck renal function next week. I think it would be better to let him go home. He had negative cardiac enzymes and has no chest pain. Consider loop recorder once coronary status is known. ZPlan for cardiac cath once renal function is back to baseline. 2. Ischemic cardiomyopathy. EF 40-45%. No evidence of volume overload. On low dose carvedilol. Hold ACEi/ARB due to acute on CKD.  3. CAD s/p CABG in 2003. When able to do cardiac cath will plan left femoral approach. No pulse on right femoral and left radial artery used previously for graft.  4. PAD s/p bilateral BKA.  5. Acute on CKD stage 3. Creatinine increased post CT. Baseline 1.3-1.4, increased to  1.85 post CT. Now 1.67.  6. DM type 2 on insulin.  7. HL 8. Cerebrovascular disease.   Discussed above with Dr. Robb Matarrtiz. Will arrange follow up in our office next week with [facility].  Present on Admission:  . Syncope . Accelerated hypertension . Diabetes mellitus type 2, uncontrolled . Hypothyroidism . Coronary atherosclerosis . Hyperlipidemia LDL goal <70  Signed, Peter [place], MDFACC 04/22/2014 8:03 AM

## 2014-04-22 NOTE — Discharge Summary (Addendum)
Physician Discharge Summary  Christopher Padilla GUR:[id number] DOB: 03/30/1941 DOA: 04/18/2014  PCP: Redge Gainer, MD  Admit date: 04/18/2014 Discharge date: 04/22/2014  Time spent: 35 minutes  Recommendations for Outpatient Follow-up:  1. Follow up with cardiology next week for cardiac cath. 2. Will need b-met next week to monitor Cr.  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 male with history of CAD status post CABG, bilateral BKA, hypothyroidism, hyperlipidemia and chronic kidney disease and diabetes mellitus was brought to the ER after patient had a brief episode of loss of consciousness. Patient states that around 3 PM when he was working on his papers he suddenly briefly lost consciousness without any prodromal symptoms. Patient denies any chest pain palpitations shortness of breath nausea vomiting prior to the incident. Patient felt diaphoretic after the incident. He regained his consciousness is within a minute time  Hospital Course:  Syncope and collapse: - No prodromal symptoms. - No events on telemetry, cardiac markers negative x3. - d-dimer +, Ct angio no acute PE. - Cardiology consulted. - Echo showed decrease EF, new wall motion abnormality. cards rec cardiac cath. - Mild increase in cr., cardiac cath post pone for next week.  Chronic systolic heart failure/Ischemic cardiomyopathy h/o CABG > 10 years: - Cont ACE-I, EF 40% start Beta blockers. - ASA and  statins.  AKI: - With recent exposure to contrast in the setting of ACE-I and DM.  - Most likely contrast induce nephropathy. - NS bolus Bolus and antihypertensive medications held, cr imrpoved. - hold ACE-I for 1 week.  Accelerated hypertension: - Continue home medication and closely follow blood pressure trends.  Diabetes mellitus type 2, uncontrolled: - poorly controlled, A1c high. - great controlled in house. - Resume insulin home dose.  Hypothyroidism - cont synthroid.     Procedures:  Ct angio of chest SHowed no PE.  Echo showed new wall motion abnormality with Decrease EF.  Consultations:  cardiology  Discharge Exam: Filed Vitals:   04/22/14 0702  BP: 154/55  Pulse: 71  Temp: 97.5 F (36.4 C)  Resp: 18    General: A&O x3 Cardiovascular: RRR Respiratory: good air movement CTA B/L  Discharge Instructions You were cared for by a hospitalist during your hospital stay. If you have any questions about your discharge medications or the care you received while you were in the hospital after you are discharged, you can call the unit and asked to speak with the hospitalist on call if the hospitalist that took care of you is not available. Once you are discharged, your primary care physician will handle any further medical issues. Please note that NO REFILLS for any discharge medications will be authorized once you are discharged, as it is imperative that you return to your primary care physician (or establish a relationship with a primary care physician if you do not have one) for your aftercare needs so that they can reassess your need for medications and monitor your lab values.  Discharge  Instructions    Diet - low sodium heart healthy    Complete by:  As directed      Increase activity slowly    Complete by:  As directed           Current Discharge Medication List    START taking these medications   Details  carvedilol (COREG) 3.125 MG tablet Take 1  tablet (3.125 mg total) by mouth 2 (two) times daily with a meal. Qty: 60 tablet, Refills: 3      CONTINUE these medications which have CHANGED   Details  ramipril (ALTACE) 2.5 MG capsule TAKE 1 CAPSULE BY MOUTH DAILY Qty: 90 capsule, Refills: 3      CONTINUE these medications which have NOT CHANGED   Details  aspirin 81 MG tablet Take 81 mg by mouth daily.      BD INSULIN SYRINGE ULTRAFINE 31G X 15/64" 0.5 ML MISC USE AS DIRECTED Qty: 100 each, Refills: 2    Cholecalciferol (VITAMIN D PO) Take 1 tablet by mouth 2 (two) times daily.    gabapentin (NEURONTIN) 600 MG tablet TAKE 1 CAPSULE BY MOUTH THREE TIMES A DAY Qty: 270 tablet, Refills: 2    insulin glargine (LANTUS) 100 UNIT/ML injection INJECT 4O UNITS SUBCUTANEOUSLY AT BEDTIME Qty: 30 mL, Refills: 5    insulin lispro (HUMALOG) 100 UNIT/ML injection INJECT 0-20 UNITS INTO THE SKIN AS DIRECTED. SLIDING SCALE Qty: 10 mL, Refills: 5    levothyroxine (SYNTHROID, LEVOTHROID) 125 MCG tablet TAKE 1 TABLET BY MOUTH DAILY Qty: 90 tablet, Refills: 5    nortriptyline (PAMELOR) 25 MG capsule TAKE 1 CAPSULE BY MOUTH AT BEDTIME Qty: 90 capsule, Refills: 3    omeprazole (PRILOSEC) 20 MG capsule TAKE 1 CAPSULE BY MOUTH DAILY Qty: 90 capsule, Refills: 3    ONE TOUCH ULTRA TEST test strip USE TO TEST SUGAR 4 TIMES DAILY Qty: 100 each, Refills: 4    simvastatin (ZOCOR) 40 MG tablet Take 1 tablet (40 mg total) by mouth at bedtime. Qty: 90 tablet, Refills: 3       Allergies  Allergen Reactions  . Codeine     hallucinations  . Ciprofloxacin Diarrhea  . Nsaids Other (See Comments)    GI upset   Follow-up Information    Follow up with Truitt Merle, NP On 04/27/2014.   Specialty:  Nurse Practitioner   Why:  2:00 PM   Contact information:   [address] [place] [address] [phone]        The results of significant diagnostics from this hospitalization (including imaging, microbiology, ancillary and  laboratory) are listed below for reference.    Significant Diagnostic Studies: Dg Chest 2 View  04/18/2014   CLINICAL DATA:  Syncope.  EXAM: CHEST  2 VIEW  COMPARISON:  August 30, 2013.  FINDINGS: Stable cardiomediastinal silhouette. Status post coronary artery bypass graft. Stable blunting of left costophrenic sulcus is noted most consistent with scarring. No pneumothorax is noted. No significant pleural effusion is noted. No acute pulmonary disease is noted. Postsurgical changes are again noted involving the left ribs.  IMPRESSION: Postsurgical changes as described above. No acute cardiopulmonary abnormality seen.   Electronically Signed   By: Sabino Dick M.D.   On: 04/18/2014 18:00   Ct Head Wo Contrast  04/18/2014   CLINICAL DATA:  Syncope  EXAM: CT HEAD WITHOUT CONTRAST  TECHNIQUE: Contiguous axial images were obtained from the base of the skull through the vertex without intravenous contrast.  COMPARISON:  Multiple exams, including 08/30/2003 and 08/29/2003  FINDINGS: Faint hypodensity in the central upper pons and in the periventricular white matter compatible with chronic ischemic microvascular white matter disease. Remote lacunar infarcts in the right putamen in the and right thalamus. Remote lacunar infarct in the medial left thalamus.  No intracranial hemorrhage, mass lesion, or acute CVA.  There is left pneumoparotid. Mild chronic bilateral maxillary sinusitis. There is atherosclerotic calcification of the cavernous carotid arteries bilaterally. Small right mastoid effusion.  IMPRESSION: 1. No acute intracranial findings. 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 2. Left pneumoparotid. Differential diagnostic considerations include a iatrogenic (recent dental instrumentation, general anesthesia with endotracheal intubation, or spirometry), chronic cough (COPD, cystic fibrosis, allergic rhinitis), and can be seen also in wind instrument musicians, scuba divers, and glass  blowers.   Electronically Signed   By: Sherryl Barters M.D.   On: 04/18/2014 17:55   Ct Angio Chest Pe W/cm &/or Wo Cm  04/19/2014   CLINICAL DATA:  Syncopal episode yesterday, positive D-dimer  EXAM: CT ANGIOGRAPHY CHEST WITH CONTRAST  TECHNIQUE: Multidetector CT imaging of the chest was performed using the standard protocol during bolus administration of intravenous contrast. Multiplanar CT image reconstructions and MIPs were obtained to evaluate the vascular anatomy.  CONTRAST:  62m OMNIPAQUE IOHEXOL 350 MG/ML SOLN  COMPARISON:  None.  FINDINGS: Sagittal images of the spine shows mild degenerative changes thoracic spine. The patient is status post median sternotomy.  Images of the thoracic inlet are unremarkable. Central airways are patent. Atherosclerotic calcifications of thoracic aorta and coronary arteries are noted. No pericardial effusion. Visualized upper abdomen shows tiny calcified flattening gallstones within gallbladder. No adrenal gland mass is noted in visualized upper abdomen.  The study is of excellent technical quality. No pulmonary embolus is noted.  There is prior left posterior rib resection. Old left rib fracture. Small left pleural effusion with left base pleural thickening. There are diaphragmatic and basilar posterior pleural calcifications on left side. Probable sequela from prior infection or inflammatory process.  There is some atelectasis or scarring in left upper lobe posterior medially along the fissure. No acute infiltrate or pulmonary edema. There is no mediastinal hematoma or adenopathy. No hilar adenopathy. No axillary adenopathy. Status post CABG.  Review of the MIP images confirms the above findings.  IMPRESSION: 1. No pulmonary embolus is identified. 2. No acute infiltrate or pulmonary edema. Small left pleural effusion. There is pleural thickening and there are left base posterior and left diaphragmatic pleural calcifications probable sequela from prior infection or  inflammation. Postsurgical changes left posterior ribs. 3. No mediastinal hematoma or adenopathy. 4. Atherosclerotic calcifications of thoracic aorta and coronary arteries. Status post CABG. 5. Small layering calcified gallstones are noted within gallbladder.   Electronically Signed   By: LLahoma CrockerM.D.   On: 04/19/2014 10:50    Microbiology: Recent Results (from the past 240 hour(s))  MRSA PCR Screening     Status: Abnormal   Collection Time: 04/19/14  2:36 AM  Result Value Ref Range Status   MRSA by PCR POSITIVE (A) NEGATIVE Final    Comment:        The GeneXpert MRSA Assay (FDA approved for NASAL specimens only), is one component of a comprehensive MRSA colonization surveillance program. It is not intended to diagnose MRSA infection nor to guide or monitor treatment for MRSA infections. RESULT CALLED TO, READ BACK BY AND VERIFIED WITH: CALLED TO RN AEbony HailDUNDON [id number]  [facility]  Labs: Basic Metabolic Panel:  Recent Labs Lab 04/18/14 1819 04/19/14 0437 04/21/14 0512 04/22/14 0419  NA 135* 140 136* 140  K 5.2 4.4 4.7 5.0  CL 98 101 98 102  CO2 _0 GLUCOSE 357* 117* 185* 175*  BUN 17 16 28* 27*  CREATININE 1.39* 1.28 1.85* 1.67*  CALCIUM 9.2 9.7 9.4 9.3   Liver Function Tests:  Recent Labs Lab 04/19/14 0437  AST 21  ALT 18  ALKPHOS 120*  BILITOT 0.4  PROT 7.3  ALBUMIN 3.6   No results for input(s): LIPASE, AMYLASE in the last 168 hours. No results for input(s): AMMONIA in the last 168 hours. CBC:  Recent Labs Lab 04/18/14 1819 04/19/14 0437  WBC 7.0 6.9  NEUTROABS  --  3.7  HGB 13.5 14.3  HCT 39.8 43.3  MCV 90.5 89.3  PLT 131* 147*   Cardiac Enzymes:  Recent Labs Lab 04/18/14 2341  TROPONINI <0.30   BNP: BNP (last 3 results) No results for input(s): PROBNP in the last 8760 hours. CBG:  Recent Labs Lab 04/21/14 0917 04/21/14 1133 04/21/14 1647 04/21/14 2105 04/22/14 0814  GLUCAP 134* 211* 155* 109* 130*        Signed:  Charlynne Cousins  [facility] 04/22/2014, 8:37 AM

## 2014-04-22 NOTE — Discharge Instructions (Signed)
Cannot drive for 6 months. Until cardiology clears you for driving.

## 2014-04-25 SURGERY — LEFT HEART CATHETERIZATION WITH CORONARY/GRAFT ANGIOGRAM
Anesthesia: LOCAL

## 2014-04-27 ENCOUNTER — Ambulatory Visit (INDEPENDENT_AMBULATORY_CARE_PROVIDER_SITE_OTHER): Payer: Commercial Managed Care - HMO | Admitting: Nurse Practitioner

## 2014-04-27 ENCOUNTER — Ambulatory Visit (HOSPITAL_COMMUNITY): Payer: Medicare HMO | Attending: Nurse Practitioner | Admitting: *Deleted

## 2014-04-27 ENCOUNTER — Encounter: Payer: Self-pay | Admitting: Nurse Practitioner

## 2014-04-27 ENCOUNTER — Other Ambulatory Visit (INDEPENDENT_AMBULATORY_CARE_PROVIDER_SITE_OTHER): Payer: Commercial Managed Care - HMO | Admitting: *Deleted

## 2014-04-27 VITALS — BP 130/70 | HR 77 | Ht 68.0 in | Wt 162.0 lb

## 2014-04-27 DIAGNOSIS — R55 Syncope and collapse: Secondary | ICD-10-CM

## 2014-04-27 DIAGNOSIS — IMO0002 Reserved for concepts with insufficient information to code with codable children: Secondary | ICD-10-CM

## 2014-04-27 DIAGNOSIS — E785 Hyperlipidemia, unspecified: Secondary | ICD-10-CM | POA: Insufficient documentation

## 2014-04-27 DIAGNOSIS — N189 Chronic kidney disease, unspecified: Secondary | ICD-10-CM

## 2014-04-27 DIAGNOSIS — E119 Type 2 diabetes mellitus without complications: Secondary | ICD-10-CM | POA: Insufficient documentation

## 2014-04-27 DIAGNOSIS — I739 Peripheral vascular disease, unspecified: Secondary | ICD-10-CM

## 2014-04-27 DIAGNOSIS — Z87891 Personal history of nicotine dependence: Secondary | ICD-10-CM | POA: Insufficient documentation

## 2014-04-27 DIAGNOSIS — I6523 Occlusion and stenosis of bilateral carotid arteries: Secondary | ICD-10-CM | POA: Insufficient documentation

## 2014-04-27 DIAGNOSIS — I259 Chronic ischemic heart disease, unspecified: Secondary | ICD-10-CM

## 2014-04-27 DIAGNOSIS — Z951 Presence of aortocoronary bypass graft: Secondary | ICD-10-CM | POA: Diagnosis not present

## 2014-04-27 DIAGNOSIS — I2510 Atherosclerotic heart disease of native coronary artery without angina pectoris: Secondary | ICD-10-CM | POA: Diagnosis not present

## 2014-04-27 DIAGNOSIS — E1165 Type 2 diabetes mellitus with hyperglycemia: Secondary | ICD-10-CM

## 2014-04-27 DIAGNOSIS — I10 Essential (primary) hypertension: Secondary | ICD-10-CM | POA: Diagnosis not present

## 2014-04-27 LAB — BASIC METABOLIC PANEL
BUN: 27 mg/dL — ABNORMAL HIGH (ref 6–23)
CO2: 26 mEq/L (ref 19–32)
Calcium: 8.5 mg/dL (ref 8.4–10.5)
Chloride: 104 mEq/L (ref 96–112)
Creatinine, Ser: 1.8 mg/dL — ABNORMAL HIGH (ref 0.4–1.5)
GFR: 39.5 mL/min — ABNORMAL LOW (ref >60.00)
Glucose, Bld: 225 mg/dL — ABNORMAL HIGH (ref 70–99)
Potassium: 4.9 mEq/L (ref 3.5–5.1)
Sodium: 135 mEq/L (ref 135–145)

## 2014-04-27 NOTE — Addendum Note (Signed)
Addended by: WYSOR, TAMMY T on: 04/27/2014 02:23 PM ° ° Modules accepted: Orders ° °

## 2014-04-27 NOTE — Patient Instructions (Addendum)
We will be checking the following labs today BMET  BMET with Dr. Christell Constant next Wednesday at his office and have them send to [place]  Do not take any more Ramipril until we tell you to  When your kidney function gets better we will plan to do a cardiac catheterization  Call the [facility] office at [phone] if you have any questions, problems or concerns.

## 2014-04-27 NOTE — Addendum Note (Signed)
Addended by: Armen Pickup T on: 04/27/2014 02:23 PM   Modules accepted: Orders

## 2014-04-27 NOTE — Progress Notes (Signed)
Carotid duplex complete 

## 2014-04-27 NOTE — Progress Notes (Addendum)
 Christopher Padilla Date of Birth: 08/24/1940 Medical Record #[id number]  History of Present Illness: [name] is seen back today for a post hospital visit. Suppose to be a TOC visit but no phone call noted. He is seen for Dr. Hochrein. He is a 73 y.o. male with history of CAD status post CABG in 2003 with LIMA to the LAD, left free radial artery to an obtuse marginal, saphenous vein graft to diagonal, saphenous vein graft to RCA with endarterectomy per Dr. Peter VanTrigt, old inferior infarction, previously EF of 50-55% and past nuclear study with inferior scar and small area of apical ischemia. His other issues include bilateral BKA, hypothyroidism, uncontrolled DM, hyperlipidemia and chronic kidney disease and diabetes mellitus.  The patient is also noted to have bilateral carotid disease with last doppler study in June of 2014 - to have been repeated in 6 months but does not look like this was done.  Most recently presented to [facility] with syncope - etiology unclear. No significant arrhythmia but a 4 beat run of NSVT. New wall motion abnormality on echo noted. EF now down to 40 to 45%. Was planned for cardiac cath but cancelled due to elevated creatinine. Felt to be high risk for contrast induced nephropathy due to extensive PAD, DM and CKD. Plan was to let him go home, let his creatinine return to baseline and then proceed with cath (left femoral approach). To consider loop recorder once his coronary status was known.   Comes in today. Here alone.  He says he is doing good. No chest pain. No more syncope. He is not driving. He has continued to take his ACE - did not stop for a week as noted in the discharge papers. He is on Coreg. No problems so far. He sees Dr. Moore in [place]. Easier to get there. He understands the plan of care about the need for cath and then possible loop recorder.    Current Outpatient Prescriptions  Medication Sig Dispense Refill  . aspirin 81 MG tablet Take 81 mg by mouth  daily.      . BD INSULIN SYRINGE ULTRAFINE 31G X 15/64" 0.5 ML MISC USE AS DIRECTED 100 each 2  . carvedilol (COREG) 3.125 MG tablet Take 1 tablet (3.125 mg total) by mouth 2 (two) times daily with a meal. 60 tablet 3  . Cholecalciferol (VITAMIN D PO) Take 1 tablet by mouth 2 (two) times daily.    . gabapentin (NEURONTIN) 600 MG tablet TAKE 1 CAPSULE BY MOUTH THREE TIMES A DAY (Patient taking differently: Take 600 mg by mouth 2 (two) times daily. ) 270 tablet 2  . insulin glargine (LANTUS) 100 UNIT/ML injection INJECT 4O UNITS SUBCUTANEOUSLY AT BEDTIME (Patient taking differently: Inject 35 Units into the skin at bedtime. ) 30 mL 5  . insulin lispro (HUMALOG) 100 UNIT/ML injection INJECT 0-20 UNITS INTO THE SKIN AS DIRECTED. SLIDING SCALE 10 mL 5  . levothyroxine (SYNTHROID, LEVOTHROID) 125 MCG tablet TAKE 1 TABLET BY MOUTH DAILY 90 tablet 5  . nortriptyline (PAMELOR) 25 MG capsule TAKE 1 CAPSULE BY MOUTH AT BEDTIME 90 capsule 3  . omeprazole (PRILOSEC) 20 MG capsule TAKE 1 CAPSULE BY MOUTH DAILY 90 capsule 3  . ONE TOUCH ULTRA TEST test strip USE TO TEST SUGAR 4 TIMES DAILY 100 each 4  . ramipril (ALTACE) 2.5 MG capsule TAKE 1 CAPSULE BY MOUTH DAILY 90 capsule 3  . simvastatin (ZOCOR) 40 MG tablet Take 1 tablet (40 mg total) by mouth   at bedtime. 90 tablet 3   No current facility-administered medications for this visit.    Allergies  Allergen Reactions  . Codeine     hallucinations  . Ciprofloxacin Diarrhea  . Nsaids Other (See Comments)    GI upset    Past Medical History  Diagnosis Date  . CAD (coronary artery disease)     S/P CABG, Feb 2003, LIMA to the LAD, left free radial artery to an obtuse marginal, spahenous vein graft to diagonal, saphenous vein graft to RCA with endarterectomy  . S/P below knee amputation   . Cerebrovascular disease     MRA in 2005 with 75% stenosis in distal right vertebral artery and 75% stenosis greater in distal cervical internal carotid artery on teh  right, severe bilateral disease and MRA of the extracranial circulation, high grade stenosis of the distal right internal carotid artery at the junction of the cervical internal carotid artery of the skull base  . Diabetes mellitus   . Hyperlipidemia   . Hypothyroidism   . History of tobacco use     Quit in 1990  . Myocardial infarction 2002  . GERD (gastroesophageal reflux disease)   . Arthritis   . Cataract     Past Surgical History  Procedure Laterality Date  . Leg amputation below knee      Bilateral  . Arterial bypass surgry      Left; left femoral to posterior tibial bypass gracting, left femoral artery and deep femoral artery endarterectomy with vein patch angioplasty of the common femoral arter and deep femoral artery 06/2005, right fremoral popliteal bypass, right iliac artery restent, bilateral below knee amputations  . Thoracotomy      With drainage of a hemathroax and decortication of fibrothorax  . Coronary artery bypass graft  2002    By Peter Van Trigt, MD  . Cataract extraction      Bilateral  . Carpal tunnel release      Right  . Amputation  10/17/2011    Procedure: AMPUTATION DIGIT;  Surgeon: Marcus V Duda, MD;  Location: [facility] OR;  Service: Orthopedics;  Laterality: Left;  Amputation Left Index Finger at PIP Joint  . Finger surgery Right     pointer    History  Smoking status  . Former Smoker -- 1.00 packs/day  . Types: Cigarettes  . Start date: 11/04/1954  . Quit date: 05/06/1988  Smokeless tobacco  . Never Used    History  Alcohol Use No    Family History  Problem Relation Age of Onset  . Cancer Mother     brain and lung  . Diabetes Father 60  . Coronary artery disease Father   . Colon cancer Neg Hx   . Diabetes Sister   . Diabetes Brother   . Diabetes Son     Review of Systems: The review of systems is per the HPI.  All other systems were reviewed and are negative.  Physical Exam: BP 130/70 mmHg  Pulse 77  Ht 5' 8" (1.727 m)  Wt 162 lb  (73.483 kg)  BMI 24.64 kg/m2  SpO2 95% Patient is very pleasant and in no acute distress. Skin is warm and dry. Color is normal.  HEENT is unremarkable. Normocephalic/atraumatic. PERRL. Sclera are nonicteric. Neck is supple. No masses. No JVD. Lungs are clear. Cardiac exam shows a regular rate and rhythm. Soft outflow murmur. Abdomen is soft. Extremities are without edema. He has bilateral prostheses. Gait and ROM are intact. No gross neurologic deficits   noted.  Wt Readings from Last 3 Encounters:  04/27/14 162 lb (73.483 kg)  04/22/14 153 lb 11.2 oz (69.718 kg)  03/21/14 156 lb (70.761 kg)    LABORATORY DATA/PROCEDURES: BMET pending  Lab Results  Component Value Date   WBC 6.9 04/19/2014   HGB 14.3 04/19/2014   HCT 43.3 04/19/2014   PLT 147* 04/19/2014   GLUCOSE 175* 04/22/2014   CHOL 94* 01/31/2014   TRIG 53 01/31/2014   HDL 43 01/31/2014   LDLCALC 40 01/31/2014   ALT 18 04/19/2014   AST 21 04/19/2014   NA 140 04/22/2014   K 5.0 04/22/2014   CL 102 04/22/2014   CREATININE 1.67* 04/22/2014   BUN 27* 04/22/2014   CO2 28 04/22/2014   TSH 0.423 04/18/2014   PSA 0.9 01/31/2014   INR 0.97 04/21/2014   HGBA1C 10.1* 04/18/2014    BNP (last 3 results) No results for input(s): PROBNP in the last 8760 hours.  Echo Study Conclusions from 04/2014  - Left ventricle: The cavity size was normal. Wall thickness was normal. Systolic function was mildly to moderately reduced. The estimated ejection fraction was in the range of 40% to 45%. There is hypokinesis of the mid-apicalanteroseptal and apical myocardium. Doppler parameters are consistent with abnormal left ventricular relaxation (grade 1 diastolic dysfunction). Doppler parameters are consistent with high ventricular filling pressure. - Aortic valve: There was mild regurgitation. - Mitral valve: There was moderate regurgitation. - Tricuspid valve: There was moderate regurgitation. - Pulmonary arteries: Systolic  pressure was mildly to moderately increased. PA peak pressure: 40 mm Hg (S).   Assessment / Plan: 1. Syncope. Etiology is unclear. No significant arrhythmia noted. One 4 beat run of NSVT. ? Hypoglycemia with glucose down to 44 last pm. New wall motion abnormality noted on Echo. Plan was for cardiac cath but creatinine was elevated from baseline post CT chest. Patient at high risk for contrast induced nephropathy due to extensive PAD, DM, and CKD. Dr. Jordan has felt that it would be best to postpone cardiac cath until renal function has returned to baseline 1.4. Consider loop recorder once coronary status is known. We are checking BMET today. Will more than likely need repeat BMET next week and will try to get at Dr. Moore's office since that is closer for him. No driving.   2. Ischemic cardiomyopathy. EF 40-45%. No evidence of volume overload. On low dose carvedilol. I have asked him to stop the ACE for now  3. CAD s/p CABG in 2003. Per Dr. Jordan - when able to do cardiac cath will plan left femoral approach. No pulse on right femoral and left radial artery used previously for graft.   4. PAD s/p bilateral BKA.   5. Acute on CKD stage 3. Creatinine increased post CT. Baseline 1.3-1.4 - rechecking BMET today and probably will need repeat in one week.  6. DM type 2 on insulin.   7. HL  8. Carotid disease - last study 1 1/2 years ago with bilateral disease noted - 60 to 79% on the right and 40 to 59% on the left. Will get his doppler study updated today.   Further disposition to follow.   Patient is agreeable to this plan and will call if any problems develop in the interim.   Darrnell Mangiaracina C. Dotty Gonzalo, RN, ANP-C [facility] [address] [place], [place]  [address] [phone]  Addendum: Repeat of his lab on 05/04/14  Lab Results  Component Value   Date   CREATININE 1.40* 05/04/2014   CREATININE 1.8* 04/27/2014   CREATININE 1.67* 04/22/2014    I have arranged to proceed on with cardiac catheterization next week with Dr. Jordan.   Jacaria Colburn C. Chealsey Miyamoto, RN, ANP-C [facility] [address] [place], [place]  [address] [phone]  

## 2014-04-28 ENCOUNTER — Encounter: Payer: Self-pay | Admitting: Cardiology

## 2014-05-03 ENCOUNTER — Other Ambulatory Visit: Payer: Medicare HMO

## 2014-05-04 ENCOUNTER — Other Ambulatory Visit (INDEPENDENT_AMBULATORY_CARE_PROVIDER_SITE_OTHER): Payer: Commercial Managed Care - HMO

## 2014-05-04 DIAGNOSIS — E1165 Type 2 diabetes mellitus with hyperglycemia: Secondary | ICD-10-CM

## 2014-05-04 DIAGNOSIS — IMO0002 Reserved for concepts with insufficient information to code with codable children: Secondary | ICD-10-CM

## 2014-05-04 DIAGNOSIS — I259 Chronic ischemic heart disease, unspecified: Secondary | ICD-10-CM

## 2014-05-04 DIAGNOSIS — I1310 Hypertensive heart and chronic kidney disease without heart failure, with stage 1 through stage 4 chronic kidney disease, or unspecified chronic kidney disease: Secondary | ICD-10-CM

## 2014-05-04 DIAGNOSIS — R55 Syncope and collapse: Secondary | ICD-10-CM

## 2014-05-04 NOTE — Progress Notes (Signed)
Labs for Dr Norma Fredrickson

## 2014-05-05 ENCOUNTER — Other Ambulatory Visit: Payer: Self-pay | Admitting: Nurse Practitioner

## 2014-05-05 ENCOUNTER — Telehealth: Payer: Self-pay | Admitting: *Deleted

## 2014-05-05 ENCOUNTER — Encounter: Payer: Self-pay | Admitting: *Deleted

## 2014-05-05 DIAGNOSIS — I25700 Atherosclerosis of coronary artery bypass graft(s), unspecified, with unstable angina pectoris: Secondary | ICD-10-CM

## 2014-05-05 DIAGNOSIS — R55 Syncope and collapse: Secondary | ICD-10-CM

## 2014-05-05 LAB — BMP8+EGFR
BUN/Creatinine Ratio: 14 (ref 10–22)
BUN: 20 mg/dL (ref 8–27)
CO2: 26 mmol/L (ref 18–29)
Calcium: 9.2 mg/dL (ref 8.6–10.2)
Chloride: 97 mmol/L (ref 97–108)
Creatinine, Ser: 1.4 mg/dL — ABNORMAL HIGH (ref 0.76–1.27)
GFR calc Af Amer: 56 mL/min/1.73 — ABNORMAL LOW
GFR calc non Af Amer: 49 mL/min/1.73 — ABNORMAL LOW
Glucose: 230 mg/dL — ABNORMAL HIGH (ref 65–99)
Potassium: 4.7 mmol/L (ref 3.5–5.2)
Sodium: 138 mmol/L (ref 134–144)

## 2014-05-05 NOTE — Telephone Encounter (Signed)
I spoke with Christopher Padilla & wife. Left heart cath is scheduled for 05/10/14 at 12 with Dr. [place] at [facility].  Lab work on 05/09/14 Christopher Padilla will come in at 6 am for IV hydration.     Please call Mr. Higinbotham,         I have looked at his lab from yesterday at Dr. Kathi Der office. His kidney function has improved and since his creatinine is back down to 1.4 - he can proceed with cardiac cath as discussed at his visit.        Would like scheduled with Dr. [place] - left heart cath - no LV gram    Left femoral approach.    Show up at least 6 hours prior to start fluids.     Pre cath lab the day before - BMET, CBC, Christopher Padilla and PTT        Please let me know the date and I will place cath orders.        Thanks        Lawson Fiscal

## 2014-05-06 DIAGNOSIS — M8580 Other specified disorders of bone density and structure, unspecified site: Secondary | ICD-10-CM

## 2014-05-06 HISTORY — DX: Other specified disorders of bone density and structure, unspecified site: M85.80

## 2014-05-09 ENCOUNTER — Other Ambulatory Visit (INDEPENDENT_AMBULATORY_CARE_PROVIDER_SITE_OTHER): Payer: Commercial Managed Care - HMO | Admitting: *Deleted

## 2014-05-09 ENCOUNTER — Encounter: Payer: Self-pay | Admitting: Nurse Practitioner

## 2014-05-09 ENCOUNTER — Ambulatory Visit (INDEPENDENT_AMBULATORY_CARE_PROVIDER_SITE_OTHER): Payer: Commercial Managed Care - HMO | Admitting: Nurse Practitioner

## 2014-05-09 ENCOUNTER — Ambulatory Visit (INDEPENDENT_AMBULATORY_CARE_PROVIDER_SITE_OTHER): Payer: Commercial Managed Care - HMO

## 2014-05-09 VITALS — BP 154/73 | HR 74 | Ht 68.0 in | Wt 162.0 lb

## 2014-05-09 DIAGNOSIS — M545 Low back pain, unspecified: Secondary | ICD-10-CM

## 2014-05-09 DIAGNOSIS — I25700 Atherosclerosis of coronary artery bypass graft(s), unspecified, with unstable angina pectoris: Secondary | ICD-10-CM

## 2014-05-09 DIAGNOSIS — W19XXXA Unspecified fall, initial encounter: Secondary | ICD-10-CM

## 2014-05-09 LAB — BASIC METABOLIC PANEL
BUN: 16 mg/dL (ref 6–23)
CO2: 29 mEq/L (ref 19–32)
Calcium: 8.8 mg/dL (ref 8.4–10.5)
Chloride: 102 mEq/L (ref 96–112)
Creatinine, Ser: 1.3 mg/dL (ref 0.4–1.5)
GFR: 58.16 mL/min — ABNORMAL LOW (ref >60.00)
Glucose, Bld: 280 mg/dL — ABNORMAL HIGH (ref 70–99)
Potassium: 4.4 mEq/L (ref 3.5–5.1)
Sodium: 137 mEq/L (ref 135–145)

## 2014-05-09 LAB — CBC WITH DIFFERENTIAL/PLATELET
Basophils Absolute: 0 10*3/uL (ref 0.0–0.1)
Basophils Relative: 0.3 % (ref 0.0–3.0)
Eosinophils Absolute: 0.2 10*3/uL (ref 0.0–0.7)
Eosinophils Relative: 2.3 % (ref 0.0–5.0)
HCT: 41.9 % (ref 39.0–52.0)
Hemoglobin: 13.5 g/dL (ref 13.0–17.0)
Lymphocytes Relative: 24.1 % (ref 12.0–46.0)
Lymphs Abs: 1.7 10*3/uL (ref 0.7–4.0)
MCHC: 32.2 g/dL (ref 30.0–36.0)
MCV: 91.9 fl (ref 78.0–100.0)
Monocytes Absolute: 0.6 10*3/uL (ref 0.1–1.0)
Monocytes Relative: 8.7 % (ref 3.0–12.0)
Neutro Abs: 4.4 10*3/uL (ref 1.4–7.7)
Neutrophils Relative %: 64.6 % (ref 43.0–77.0)
Platelets: 200 10*3/uL (ref 150.0–400.0)
RBC: 4.56 Mil/uL (ref 4.22–5.81)
RDW: 14 % (ref 11.5–15.5)
WBC: 6.9 10*3/uL (ref 4.0–10.5)

## 2014-05-09 LAB — APTT: aPTT: 25.4 s (ref 23.4–32.7)

## 2014-05-09 LAB — PROTIME-INR
INR: 1 ratio (ref 0.8–1.0)
Prothrombin Time: 11.1 s (ref 9.6–13.1)

## 2014-05-09 NOTE — Patient Instructions (Signed)
Back Pain, Adult Low back pain is very common. About 1 in 5 people have back pain.The cause of low back pain is rarely dangerous. The pain often gets better over time.About half of people with a sudden onset of back pain feel better in just 2 weeks. About 8 in 10 people feel better by 6 weeks.  CAUSES Some common causes of back pain include:  Strain of the muscles or ligaments supporting the spine.  Wear and tear (degeneration) of the spinal discs.  Arthritis.  Direct injury to the back. DIAGNOSIS Most of the time, the direct cause of low back pain is not known.However, back pain can be treated effectively even when the exact cause of the pain is unknown.Answering your caregiver's questions about your overall health and symptoms is one of the most accurate ways to make sure the cause of your pain is not dangerous. If your caregiver needs more information, he or she may order lab work or imaging tests (X-rays or MRIs).However, even if imaging tests show changes in your back, this usually does not require surgery. HOME CARE INSTRUCTIONS For many people, back pain returns.Since low back pain is rarely dangerous, it is often a condition that people can learn to manageon their own.   Remain active. It is stressful on the back to sit or stand in one place. Do not sit, drive, or stand in one place for more than 30 minutes at a time. Take short walks on level surfaces as soon as pain allows.Try to increase the length of time you walk each day.  Do not stay in bed.Resting more than 1 or 2 days can delay your recovery.  Do not avoid exercise or work.Your body is made to move.It is not dangerous to be active, even though your back may hurt.Your back will likely heal faster if you return to being active before your pain is gone.  Pay attention to your body when you bend and lift. Many people have less discomfortwhen lifting if they bend their knees, keep the load close to their bodies,and  avoid twisting. Often, the most comfortable positions are those that put less stress on your recovering back.  Find a comfortable position to sleep. Use a firm mattress and lie on your side with your knees slightly bent. If you lie on your back, put a pillow under your knees.  Only take over-the-counter or prescription medicines as directed by your caregiver. Over-the-counter medicines to reduce pain and inflammation are often the most helpful.Your caregiver may prescribe muscle relaxant drugs.These medicines help dull your pain so you can more quickly return to your normal activities and healthy exercise.  Put ice on the injured area.  Put ice in a plastic bag.  Place a towel between your skin and the bag.  Leave the ice on for 15-20 minutes, 03-04 times a day for the first 2 to 3 days. After that, ice and heat may be alternated to reduce pain and spasms.  Ask your caregiver about trying back exercises and gentle massage. This may be of some benefit.  Avoid feeling anxious or stressed.Stress increases muscle tension and can worsen back pain.It is important to recognize when you are anxious or stressed and learn ways to manage it.Exercise is a great option. SEEK MEDICAL CARE IF:  You have pain that is not relieved with rest or medicine.  You have pain that does not improve in 1 week.  You have new symptoms.  You are generally not feeling well. SEEK   IMMEDIATE MEDICAL CARE IF:   You have pain that radiates from your back into your legs.  You develop new bowel or bladder control problems.  You have unusual weakness or numbness in your arms or legs.  You develop nausea or vomiting.  You develop abdominal pain.  You feel faint. Document Released: 04/22/2005 Document Revised: 10/22/2011 Document Reviewed: 08/24/2013 ExitCare Patient Information 2015 [employer]. This information is not intended to replace advice given to you by your health care provider. Make sure you  discuss any questions you have with your health care provider.  

## 2014-05-09 NOTE — Progress Notes (Signed)
   Subjective:    Patient ID: Christopher Padilla, male    DOB: July 22, 1940, 74 y.o.   MRN: [id number]  HPI Patient in today stating that he fell yesterday at home. He said that his right lower artificial limb was not locked in and when he stood up it gave way with him and he fell over top of it lhitting right lower back on his prosthesis. Today his right leg is hurting. He is able to walk.    Review of Systems  Constitutional: Negative.   HENT: Negative.   Respiratory: Negative.   Cardiovascular: Negative.   Genitourinary: Negative.   Neurological: Negative.   Psychiatric/Behavioral: Negative.   All other systems reviewed and are negative.      Objective:   Physical Exam  Constitutional: He appears well-developed and well-nourished.  Cardiovascular: Normal rate and normal heart sounds.   Pulmonary/Chest: Effort normal and breath sounds normal.  Musculoskeletal:  FROM of back without pain- small erythematous macular sopt on right lower back.   BP 154/73 mmHg  Pulse 74  Ht 5\' 8"  (1.727 m)  Wt 162 lb (73.483 kg)  BMI 24.64 kg/m2  Lumbar x ray- normal- Preliminary reading by Paulene Floor, FNP  [facility]       Assessment & Plan:   1. Fall, initial encounter   2. Right-sided low back pain without sciatica    Moist heat to area Motrin or tylenol OTC as needed Follow up if not improving  Mary-Margaret Daphine Deutscher, FNP

## 2014-05-10 ENCOUNTER — Ambulatory Visit (HOSPITAL_COMMUNITY)
Admission: RE | Admit: 2014-05-10 | Discharge: 2014-05-10 | Disposition: A | Discharge status: Home / Self Care | Payer: Commercial Managed Care - HMO | Source: Ambulatory Visit | Attending: Cardiology | Admitting: Cardiology

## 2014-05-10 ENCOUNTER — Encounter (HOSPITAL_COMMUNITY): Payer: Self-pay | Admitting: *Deleted

## 2014-05-10 ENCOUNTER — Encounter (HOSPITAL_COMMUNITY)
Admission: RE | Disposition: A | Discharge status: Home / Self Care | Payer: Self-pay | Source: Ambulatory Visit | Attending: Cardiology

## 2014-05-10 ENCOUNTER — Other Ambulatory Visit: Payer: Self-pay | Admitting: Nurse Practitioner

## 2014-05-10 DIAGNOSIS — E785 Hyperlipidemia, unspecified: Secondary | ICD-10-CM | POA: Diagnosis not present

## 2014-05-10 DIAGNOSIS — E119 Type 2 diabetes mellitus without complications: Secondary | ICD-10-CM | POA: Diagnosis not present

## 2014-05-10 DIAGNOSIS — K219 Gastro-esophageal reflux disease without esophagitis: Secondary | ICD-10-CM | POA: Diagnosis not present

## 2014-05-10 DIAGNOSIS — I255 Ischemic cardiomyopathy: Secondary | ICD-10-CM | POA: Diagnosis not present

## 2014-05-10 DIAGNOSIS — Z7982 Long term (current) use of aspirin: Secondary | ICD-10-CM | POA: Diagnosis not present

## 2014-05-10 DIAGNOSIS — I2510 Atherosclerotic heart disease of native coronary artery without angina pectoris: Secondary | ICD-10-CM | POA: Insufficient documentation

## 2014-05-10 DIAGNOSIS — Z951 Presence of aortocoronary bypass graft: Secondary | ICD-10-CM | POA: Insufficient documentation

## 2014-05-10 DIAGNOSIS — Z87891 Personal history of nicotine dependence: Secondary | ICD-10-CM | POA: Diagnosis not present

## 2014-05-10 DIAGNOSIS — N183 Chronic kidney disease, stage 3 (moderate): Secondary | ICD-10-CM | POA: Insufficient documentation

## 2014-05-10 DIAGNOSIS — R55 Syncope and collapse: Secondary | ICD-10-CM

## 2014-05-10 DIAGNOSIS — E039 Hypothyroidism, unspecified: Secondary | ICD-10-CM | POA: Insufficient documentation

## 2014-05-10 DIAGNOSIS — I252 Old myocardial infarction: Secondary | ICD-10-CM | POA: Insufficient documentation

## 2014-05-10 DIAGNOSIS — Z794 Long term (current) use of insulin: Secondary | ICD-10-CM | POA: Diagnosis not present

## 2014-05-10 DIAGNOSIS — IMO0002 Reserved for concepts with insufficient information to code with codable children: Secondary | ICD-10-CM

## 2014-05-10 HISTORY — PX: LEFT HEART CATHETERIZATION WITH CORONARY ANGIOGRAM: SHX5451

## 2014-05-10 LAB — GLUCOSE, CAPILLARY
Glucose-Capillary: 141 mg/dL — ABNORMAL HIGH (ref 70–99)
Glucose-Capillary: 131 mg/dL — ABNORMAL HIGH (ref 70–99)

## 2014-05-10 SURGERY — LEFT HEART CATHETERIZATION WITH CORONARY ANGIOGRAM
Anesthesia: LOCAL

## 2014-05-10 MED ORDER — HEPARIN (PORCINE) IN NACL 2-0.9 UNIT/ML-% IJ SOLN
INTRAMUSCULAR | Status: AC
  Filled 2014-05-10: qty 1000

## 2014-05-10 MED ORDER — MIDAZOLAM HCL 2 MG/2ML IJ SOLN
INTRAMUSCULAR | Status: AC
  Filled 2014-05-10: qty 2

## 2014-05-10 MED ORDER — LIDOCAINE HCL (PF) 1 % IJ SOLN
INTRAMUSCULAR | Status: AC
  Filled 2014-05-10: qty 30

## 2014-05-10 MED ORDER — SODIUM CHLORIDE 0.9 % IV SOLN: INTRAVENOUS | Status: DC

## 2014-05-10 MED ORDER — SODIUM CHLORIDE 0.9 % IV SOLN: 250.0000 mL | INTRAVENOUS | Status: DC | PRN

## 2014-05-10 MED ORDER — SODIUM CHLORIDE 0.9 % IV SOLN
INTRAVENOUS | Status: DC
  Administered 2014-05-10: 11:00:00 via INTRAVENOUS

## 2014-05-10 MED ORDER — DIAZEPAM 5 MG PO TABS: 5.0000 mg | ORAL_TABLET | ORAL | Status: DC

## 2014-05-10 MED ORDER — SODIUM CHLORIDE 0.9 % IJ SOLN: 3.0000 mL | INTRAMUSCULAR | Status: DC | PRN

## 2014-05-10 MED ORDER — FENTANYL CITRATE 0.05 MG/ML IJ SOLN
INTRAMUSCULAR | Status: AC
  Filled 2014-05-10: qty 2

## 2014-05-10 MED ORDER — NITROGLYCERIN 1 MG/10 ML FOR IR/CATH LAB
INTRA_ARTERIAL | Status: AC
  Filled 2014-05-10: qty 10

## 2014-05-10 MED ORDER — SODIUM CHLORIDE 0.9 % IJ SOLN: 3.0000 mL | Freq: Two times a day (BID) | INTRAMUSCULAR | Status: DC

## 2014-05-10 MED ORDER — ASPIRIN 81 MG PO CHEW
CHEWABLE_TABLET | ORAL | Status: AC
  Filled 2014-05-10: qty 1

## 2014-05-10 MED ORDER — ASPIRIN 81 MG PO CHEW
81.0000 mg | CHEWABLE_TABLET | ORAL | Status: AC
  Administered 2014-05-10: 81 mg via ORAL

## 2014-05-10 NOTE — CV Procedure (Signed)
     Cardiac Catheterization Operative Report  Christopher Padilla [id number] 1/5/20161:04 PM Rudi Heap, MD  Procedure Performed:  1. Left Heart Catheterization 2. Selective Coronary Angiography 3. SVG angiography 4. Free radial graft angiography 5. LIMA graft angiography  Operator: Verne Carrow, MD  Indication:  74 yo male with history of CAD s/p CABG, DM with recent syncopal episode. LV function slightly depressed. Cardiac cath to assess patency of grafts.                                    Procedure Details: The risks, benefits, complications, treatment options, and expected outcomes were discussed with the patient. The patient and/or family concurred with the proposed plan, giving informed consent. The patient was brought to the cath lab after IV hydration was begun and oral premedication was given. The patient was further sedated with Versed and Fentanyl. The left groin was prepped and draped in the usual manner. Using the modified Seldinger access technique, a 5 French sheath was placed in the left femoral artery. I engaged the left main with a JL4 catheter. I engaged the RCA, SVG to RCA, Radial artery graft to OM and the LIMA graft with the JR4 catheter. I engaged the occluded SVG to Diagonal with a LCB catheter. A pigtail catheter was used to cross the aortic valve into the LV. LV pressures were measured.   There were no immediate complications. The patient was taken to the recovery area in stable condition.   Hemodynamic Findings: Central aortic pressure: 135/49 Left ventricular pressure: 134/1/9  Angiographic Findings:  Left main: Diffuse 20-30% stenosis.   Left Anterior Descending Artery: Moderate caliber diffusely diseased vessel that courses to the apex. The proximal vessel has diffuse 70% stenosis followed by 100% mid occlusion. The mid and distal vessel fills from the patent IMA graft.   Circumflex Artery: Small to moderate caliber vessel with diffuse 99%  stenosis from the ostium with 100% mid occlusion. The remainder of the AV groove Circumflex fills from left to left collaterals. These vessels are diffusely diseased. The first OM branch fills from the patent graft.   Right Coronary Artery: Moderate caliber dominant vessel with 100% proximal occlusion. The distal vessel fills from the patent vein graft.   Graft Anatomy:  SVG to RCA is patent SVG to Diagonal is occluded Free radial graft to OM1  is patent with 40% mid stenosis. This is seen in multiple views and does appears to be moderate.  LIMA to mid LAD is patent  Left Ventricular Angiogram: Deferred.   Impression: 1. Severe triple vessel CAD s/p CABG with 3/4 patent bypass grafts 2. Occluded SVG to Diagonal 3. Moderate stenosis in the patent free radial artery graft. This does not appear to be flow limiting. Since this is an arterial graft, I do not think this moderate stenosis is contributing to his isolated episode of syncope or his cardiomyopathy.  4. No symptoms of unstable angina  Recommendations: Continue medical management of CAD       Complications:  None. The patient tolerated the procedure well.

## 2014-05-10 NOTE — Progress Notes (Signed)
79f sheath removed from the lt femoral artery 1335.  Manual pressure applied for 20 min.  Post sheath removal instructions given.  Pt acknowledges and understands.  Lt popliteal artery doppled.  Lt groin level 0.  Bandage applied to lt groin.

## 2014-05-10 NOTE — Interval H&P Note (Signed)
History and Physical Interval Note:  05/10/2014 12:20 PM  Christopher Padilla  has presented today for cardiac cath with the diagnosis of cardiomyopathy, CAD, syncope.  The various methods of treatment have been discussed with the patient and family. After consideration of risks, benefits and other options for treatment, the patient has consented to  Procedure(s): LEFT HEART CATHETERIZATION WITH CORONARY ANGIOGRAM (N/A) as a surgical intervention .  The patient's history has been reviewed, patient examined, no change in status, stable for surgery.  I have reviewed the patient's chart and labs.  Questions were answered to the patient's satisfaction.    Cath Lab Visit (complete for each Cath Lab visit)  Clinical Evaluation Leading to the Procedure:   ACS: No.  Non-ACS:    Anginal Classification: CCS II  Anti-ischemic medical therapy: Minimal Therapy (1 class of medications)  Non-Invasive Test Results: No non-invasive testing performed  Prior CABG: Previous CABG        MCALHANY,CHRISTOPHER

## 2014-05-10 NOTE — H&P (View-Only) (Signed)
Silva Bandy Date of Birth: 08/16/1940 Medical Record #[id number]  History of Present Illness: [name] is seen back today for a post hospital visit. Suppose to be a TOC visit but no phone call noted. He is seen for Dr. Antoine Poche. He is a 74 y.o. male with history of CAD status post CABG in 2003 with LIMA to the LAD, left free radial artery to an obtuse marginal, saphenous vein graft to diagonal, saphenous vein graft to RCA with endarterectomy per Dr. 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 His other issues include bilateral BKA, hypothyroidism, uncontrolled DM, hyperlipidemia and chronic kidney disease and diabetes mellitus.  The patient is also noted to have bilateral carotid disease with last doppler study in June of 2014 - to have been repeated in 6 months but does not look like this was done.  Most recently presented to [facility] with syncope - etiology unclear. No significant arrhythmia but a 4 beat run of NSVT. New wall motion abnormality on echo noted. EF now down to 40 to 45%. Was planned for cardiac cath but cancelled due to elevated creatinine. Felt to be high risk for contrast induced nephropathy due to extensive PAD, DM and CKD. Plan was to let him go home, let his creatinine return to baseline and then proceed with cath (left femoral approach). To consider loop recorder once his coronary status was known.   Comes in today. Here alone.  He says he is doing good. No chest pain. No more syncope. He is not driving. He has continued to take his ACE - did not stop for a week as noted in the discharge papers. He is on Coreg. No problems so far. He sees Dr. Christell Constant in [place]. Easier to get there. He understands the plan of care about the need for cath and then possible loop recorder.    Current Outpatient Prescriptions  Medication Sig Dispense Refill  . aspirin 81 MG tablet Take 81 mg by mouth  daily.      . BD INSULIN SYRINGE ULTRAFINE 31G X 15/64" 0.5 ML MISC USE AS DIRECTED 100 each 2  . carvedilol (COREG) 3.125 MG tablet Take 1 tablet (3.125 mg total) by mouth 2 (two) times daily with a meal. 60 tablet 3  . Cholecalciferol (VITAMIN D PO) Take 1 tablet by mouth 2 (two) times daily.    Marland Kitchen gabapentin (NEURONTIN) 600 MG tablet TAKE 1 CAPSULE BY MOUTH THREE TIMES A DAY (Patient taking differently: Take 600 mg by mouth 2 (two) times daily. ) 270 tablet 2  . insulin glargine (LANTUS) 100 UNIT/ML injection INJECT 4O UNITS SUBCUTANEOUSLY AT BEDTIME (Patient taking differently: Inject 35 Units into the skin at bedtime. ) 30 mL 5  . insulin lispro (HUMALOG) 100 UNIT/ML injection INJECT 0-20 UNITS INTO THE SKIN AS DIRECTED. SLIDING SCALE 10 mL 5  . levothyroxine (SYNTHROID, LEVOTHROID) 125 MCG tablet TAKE 1 TABLET BY MOUTH DAILY 90 tablet 5  . nortriptyline (PAMELOR) 25 MG capsule TAKE 1 CAPSULE BY MOUTH AT BEDTIME 90 capsule 3  . omeprazole (PRILOSEC) 20 MG capsule TAKE 1 CAPSULE BY MOUTH DAILY 90 capsule 3  . ONE TOUCH ULTRA TEST test strip USE TO TEST SUGAR 4 TIMES DAILY 100 each 4  . ramipril (ALTACE) 2.5 MG capsule TAKE 1 CAPSULE BY MOUTH DAILY 90 capsule 3  . simvastatin (ZOCOR) 40 MG tablet Take 1 tablet (40 mg total) by mouth  at bedtime. 90 tablet 3   No current facility-administered medications for this visit.    Allergies  Allergen Reactions  . Codeine     hallucinations  . Ciprofloxacin Diarrhea  . Nsaids Other (See Comments)    GI upset    Past Medical History  Diagnosis Date  . CAD (coronary artery disease)     S/P CABG, Feb 2003, LIMA to the LAD, left free radial artery to an obtuse marginal, spahenous vein graft to diagonal, saphenous vein graft to RCA with endarterectomy  . S/P below knee amputation   . Cerebrovascular disease     MRA in 2005 with 75% stenosis in distal right vertebral artery and 75% stenosis greater in distal cervical internal carotid artery on teh  right, severe bilateral disease and MRA of the extracranial circulation, high grade stenosis of the distal right internal carotid artery at the junction of the cervical internal carotid artery of the skull base  . Diabetes mellitus   . Hyperlipidemia   . Hypothyroidism   . History of tobacco use     Quit in 1990  . Myocardial infarction 2002  . GERD (gastroesophageal reflux disease)   . Arthritis   . Cataract     Past Surgical History  Procedure Laterality Date  . Leg amputation below knee      Bilateral  . Arterial bypass surgry      Left; left femoral to posterior tibial bypass gracting, left femoral artery and deep femoral artery endarterectomy with vein patch angioplasty of the common femoral arter and deep femoral artery 06/2005, right fremoral popliteal bypass, right iliac artery restent, bilateral below knee amputations  . Thoracotomy      With drainage of a hemathroax and decortication of fibrothorax  . Coronary artery bypass graft  2002    By [name], MD  . Cataract extraction      Bilateral  . Carpal tunnel release      Right  . Amputation  10/17/2011    Procedure: AMPUTATION DIGIT;  Surgeon: [name], MD;  Location: [facility];  Service: Orthopedics;  Laterality: Left;  Amputation Left Index Finger at PIP Joint  . Finger surgery Right     pointer    History  Smoking status  . Former Smoker -- 1.00 packs/day  . Types: Cigarettes  . Start date: 11/04/1954  . Quit date: 05/06/1988  Smokeless tobacco  . Never Used    History  Alcohol Use No    Family History  Problem Relation Age of Onset  . Cancer Mother     brain and lung  . Diabetes Father 63  . Coronary artery disease Father   . Colon cancer Neg Hx   . Diabetes Sister   . Diabetes Brother   . Diabetes Son     Review of Systems: The review of systems is per the HPI.  All other systems were reviewed and are negative.  Physical Exam: BP 130/70 mmHg  Pulse 77  Ht  (1.727 m)  Wt 162 lb  (73.483 kg)  BMI 24.64 kg/m2  SpO2 95% Patient is very pleasant and in no acute distress. Skin is warm and dry. Color is normal.  HEENT is unremarkable. Normocephalic/atraumatic. PERRL. Sclera are nonicteric. Neck is supple. No masses. No JVD. Lungs are clear. Cardiac exam shows a regular rate and rhythm. Soft outflow murmur. Abdomen is soft. Extremities are without edema. He has bilateral prostheses. Gait and ROM are intact. No gross neurologic deficits  noted.  Wt Readings from Last 3 Encounters:  04/27/14 162 lb (73.483 kg)  04/22/14 153 lb 11.2 oz (69.718 kg)  03/21/14 156 lb (70.761 kg)    LABORATORY DATA/PROCEDURES: BMET pending  Lab Results  Component Value Date   WBC 6.9 04/19/2014   HGB 14.3 04/19/2014   HCT 43.3 04/19/2014   PLT 147* 04/19/2014   GLUCOSE 175* 04/22/2014   CHOL 94* 01/31/2014   TRIG 53 01/31/2014   HDL 43 01/31/2014   LDLCALC 40 01/31/2014   ALT 18 04/19/2014   AST 21 04/19/2014   NA 140 04/22/2014   K 5.0 04/22/2014   CL 102 04/22/2014   CREATININE 1.67* 04/22/2014   BUN 27* 04/22/2014   CO2 28 04/22/2014   TSH 0.423 04/18/2014   PSA 0.9 01/31/2014   INR 0.97 04/21/2014   HGBA1C 10.1* 04/18/2014    BNP (last 3 results) No results for input(s): PROBNP in the last 8760 hours.  Echo Study Conclusions from 04/2014  - Left ventricle: The cavity size was normal. Wall thickness was normal. Systolic function was mildly to moderately reduced. The estimated ejection fraction was in the range of 40% to 45%. There is hypokinesis of the mid-apicalanteroseptal and apical myocardium. Doppler parameters are consistent with abnormal left ventricular relaxation (grade 1 diastolic dysfunction). Doppler parameters are consistent with high ventricular filling pressure. - Aortic valve: There was mild regurgitation. - Mitral valve: There was moderate regurgitation. - Tricuspid valve: There was moderate regurgitation. - Pulmonary arteries: Systolic  pressure was mildly to moderately increased. PA peak pressure: 40 mm Hg (S).   Assessment / Plan: 1. Syncope. Etiology is unclear. No significant arrhythmia noted. One 4 beat run of NSVT. ? Hypoglycemia with glucose down to 44 last pm. New wall motion abnormality noted on Echo. Plan was for cardiac cath but creatinine was elevated from baseline post CT chest. Patient at high risk for contrast induced nephropathy due to extensive PAD, DM, and CKD. Dr. Swaziland has felt that it would be best to postpone cardiac cath until renal function has returned to baseline 1.4. Consider loop recorder once coronary status is known. We are checking BMET today. Will more than likely need repeat BMET next week and will try to get at Dr. Kathi Der office since that is closer for him. No driving.   2. Ischemic cardiomyopathy. EF 40-45%. No evidence of volume overload. On low dose carvedilol. I have asked him to stop the ACE for now  3. CAD s/p CABG in 2003. Per Dr. Swaziland - when able to do cardiac cath will plan left femoral approach. No pulse on right femoral and left radial artery used previously for graft.   4. PAD s/p bilateral BKA.   5. Acute on CKD stage 3. Creatinine increased post CT. Baseline 1.3-1.4 - rechecking BMET today and probably will need repeat in one week.  6. DM type 2 on insulin.   7. HL  8. Carotid disease - last study 1 1/2 years ago with bilateral disease noted - 60 to 79% on the right and 40 to 59% on the left. Will get his doppler study updated today.   Further disposition to follow.   Patient is agreeable to this plan and will call if any problems develop in the interim.   Rosalio Macadamia, RN, ANP-C [facility] [address] [place], [place]  [address] [phone]  Addendum: Repeat of his lab on 05/04/14  Lab Results  Component Value  Date   CREATININE 1.40* 05/04/2014   CREATININE 1.8* 04/27/2014   CREATININE 1.67* 04/22/2014    I have arranged to proceed on with cardiac catheterization next week with Dr. SwazilandJordan.   Rosalio MacadamiaLori C. Maysen Sudol, RN, ANP-C [facility] [address] [place], [place]  [id number] [phone]

## 2014-05-10 NOTE — Discharge Instructions (Signed)
Angiogram, Care After °Refer to this sheet in the next few weeks. These instructions provide you with information on caring for yourself after your procedure. Your health care provider may also give you more specific instructions. Your treatment has been planned according to current medical practices, but problems sometimes occur. Call your health care provider if you have any problems or questions after your procedure.  °WHAT TO EXPECT AFTER THE PROCEDURE °After your procedure, it is typical to have the following sensations: °· Minor discomfort or tenderness and a small bump at the catheter insertion site. The bump should usually decrease in size and tenderness within 1 to 2 weeks. °· Any bruising will usually fade within 2 to 4 weeks. °HOME CARE INSTRUCTIONS  °· You may need to keep taking blood thinners if they were prescribed for you. Take medicines only as directed by your health care provider. °· Do not apply powder or lotion to the site. °· Do not take baths, swim, or use a hot tub until your health care provider approves. °· You may shower 24 hours after the procedure. Remove the bandage (dressing) and gently wash the site with plain soap and water. Gently pat the site dry. °· Inspect the site at least twice daily. °· Limit your activity for the first 48 hours. Do not bend, squat, or lift anything over 20 lb (9 kg) or as directed by your health care provider. °· Plan to have someone take you home after the procedure. Follow instructions about when you can drive or return to work. °SEEK MEDICAL CARE IF: °· You get light-headed when standing up. °· You have drainage (other than a small amount of blood on the dressing). °· You have chills. °· You have a fever. °· You have redness, warmth, swelling, or pain at the insertion site. °SEEK IMMEDIATE MEDICAL CARE IF:  °· You develop chest pain or shortness of breath, feel faint, or pass out. °· You have bleeding, swelling larger than a walnut, or drainage from the  catheter insertion site. °· You develop pain, discoloration, coldness, or severe bruising in the leg or arm that held the catheter. °· You have heavy bleeding from the site. If this happens, hold pressure on the site and call 911. °MAKE SURE YOU: °· Understand these instructions. °· Will watch your condition. °· Will get help right away if you are not doing well or get worse. °Document Released: 11/08/2004 Document Revised: 09/06/2013 Document Reviewed: 09/14/2012 °ExitCare® Patient Information ©2015 [employer]. This information is not intended to replace advice given to you by your health care provider. Make sure you discuss any questions you have with your health care provider. ° °

## 2014-05-13 ENCOUNTER — Encounter: Payer: Self-pay | Admitting: Nurse Practitioner

## 2014-05-14 ENCOUNTER — Inpatient Hospital Stay (HOSPITAL_COMMUNITY)
Admission: EM | Admit: 2014-05-14 | Discharge: 2014-05-17 | DRG: 243 | Disposition: A | Discharge status: Home Health | Payer: Medicare HMO | Attending: Cardiology | Admitting: Cardiology

## 2014-05-14 ENCOUNTER — Emergency Department (HOSPITAL_COMMUNITY): Payer: Medicare HMO

## 2014-05-14 ENCOUNTER — Encounter (HOSPITAL_COMMUNITY): Payer: Self-pay | Admitting: Emergency Medicine

## 2014-05-14 ENCOUNTER — Encounter (HOSPITAL_COMMUNITY)
Admission: EM | Disposition: A | Discharge status: Home Health | Payer: Commercial Managed Care - HMO | Source: Home / Self Care | Attending: Cardiology

## 2014-05-14 ENCOUNTER — Ambulatory Visit (INDEPENDENT_AMBULATORY_CARE_PROVIDER_SITE_OTHER): Payer: Commercial Managed Care - HMO | Admitting: General Practice

## 2014-05-14 VITALS — BP 150/90 | HR 88 | Temp 96.5°F | Ht 68.0 in | Wt 162.0 lb

## 2014-05-14 DIAGNOSIS — Z89511 Acquired absence of right leg below knee: Secondary | ICD-10-CM

## 2014-05-14 DIAGNOSIS — I499 Cardiac arrhythmia, unspecified: Secondary | ICD-10-CM

## 2014-05-14 DIAGNOSIS — I6523 Occlusion and stenosis of bilateral carotid arteries: Secondary | ICD-10-CM | POA: Diagnosis present

## 2014-05-14 DIAGNOSIS — Z7982 Long term (current) use of aspirin: Secondary | ICD-10-CM | POA: Diagnosis not present

## 2014-05-14 DIAGNOSIS — I10 Essential (primary) hypertension: Secondary | ICD-10-CM

## 2014-05-14 DIAGNOSIS — E1159 Type 2 diabetes mellitus with other circulatory complications: Secondary | ICD-10-CM | POA: Diagnosis present

## 2014-05-14 DIAGNOSIS — Z794 Long term (current) use of insulin: Secondary | ICD-10-CM

## 2014-05-14 DIAGNOSIS — R7989 Other specified abnormal findings of blood chemistry: Secondary | ICD-10-CM | POA: Diagnosis present

## 2014-05-14 DIAGNOSIS — K219 Gastro-esophageal reflux disease without esophagitis: Secondary | ICD-10-CM | POA: Diagnosis present

## 2014-05-14 DIAGNOSIS — R079 Chest pain, unspecified: Secondary | ICD-10-CM

## 2014-05-14 DIAGNOSIS — E1165 Type 2 diabetes mellitus with hyperglycemia: Secondary | ICD-10-CM | POA: Diagnosis present

## 2014-05-14 DIAGNOSIS — E1122 Type 2 diabetes mellitus with diabetic chronic kidney disease: Secondary | ICD-10-CM | POA: Diagnosis not present

## 2014-05-14 DIAGNOSIS — I5022 Chronic systolic (congestive) heart failure: Secondary | ICD-10-CM | POA: Diagnosis present

## 2014-05-14 DIAGNOSIS — N183 Chronic kidney disease, stage 3 unspecified: Secondary | ICD-10-CM | POA: Diagnosis present

## 2014-05-14 DIAGNOSIS — R001 Bradycardia, unspecified: Secondary | ICD-10-CM | POA: Diagnosis present

## 2014-05-14 DIAGNOSIS — I255 Ischemic cardiomyopathy: Secondary | ICD-10-CM | POA: Diagnosis present

## 2014-05-14 DIAGNOSIS — I442 Atrioventricular block, complete: Secondary | ICD-10-CM | POA: Diagnosis not present

## 2014-05-14 DIAGNOSIS — Z89519 Acquired absence of unspecified leg below knee: Secondary | ICD-10-CM

## 2014-05-14 DIAGNOSIS — Z89022 Acquired absence of left finger(s): Secondary | ICD-10-CM | POA: Diagnosis not present

## 2014-05-14 DIAGNOSIS — I25810 Atherosclerosis of coronary artery bypass graft(s) without angina pectoris: Secondary | ICD-10-CM | POA: Diagnosis present

## 2014-05-14 DIAGNOSIS — E039 Hypothyroidism, unspecified: Secondary | ICD-10-CM | POA: Diagnosis present

## 2014-05-14 DIAGNOSIS — I428 Other cardiomyopathies: Secondary | ICD-10-CM | POA: Diagnosis present

## 2014-05-14 DIAGNOSIS — I2510 Atherosclerotic heart disease of native coronary artery without angina pectoris: Secondary | ICD-10-CM | POA: Diagnosis present

## 2014-05-14 DIAGNOSIS — Z950 Presence of cardiac pacemaker: Secondary | ICD-10-CM

## 2014-05-14 DIAGNOSIS — E785 Hyperlipidemia, unspecified: Secondary | ICD-10-CM | POA: Diagnosis present

## 2014-05-14 DIAGNOSIS — I429 Cardiomyopathy, unspecified: Secondary | ICD-10-CM

## 2014-05-14 DIAGNOSIS — E875 Hyperkalemia: Secondary | ICD-10-CM | POA: Diagnosis present

## 2014-05-14 DIAGNOSIS — N289 Disorder of kidney and ureter, unspecified: Secondary | ICD-10-CM

## 2014-05-14 DIAGNOSIS — IMO0002 Reserved for concepts with insufficient information to code with codable children: Secondary | ICD-10-CM

## 2014-05-14 DIAGNOSIS — E1139 Type 2 diabetes mellitus with other diabetic ophthalmic complication: Secondary | ICD-10-CM

## 2014-05-14 DIAGNOSIS — I252 Old myocardial infarction: Secondary | ICD-10-CM

## 2014-05-14 DIAGNOSIS — Z89512 Acquired absence of left leg below knee: Secondary | ICD-10-CM | POA: Diagnosis not present

## 2014-05-14 DIAGNOSIS — Z87891 Personal history of nicotine dependence: Secondary | ICD-10-CM | POA: Diagnosis not present

## 2014-05-14 DIAGNOSIS — Z79899 Other long term (current) drug therapy: Secondary | ICD-10-CM

## 2014-05-14 DIAGNOSIS — IMO0001 Reserved for inherently not codable concepts without codable children: Secondary | ICD-10-CM

## 2014-05-14 DIAGNOSIS — I739 Peripheral vascular disease, unspecified: Secondary | ICD-10-CM | POA: Diagnosis present

## 2014-05-14 DIAGNOSIS — I152 Hypertension secondary to endocrine disorders: Secondary | ICD-10-CM | POA: Diagnosis present

## 2014-05-14 DIAGNOSIS — R0602 Shortness of breath: Secondary | ICD-10-CM

## 2014-05-14 HISTORY — DX: Atrioventricular block, complete: I44.2

## 2014-05-14 HISTORY — PX: TEMPORARY PACEMAKER INSERTION: SHX5471

## 2014-05-14 HISTORY — DX: Chronic kidney disease, stage 3 unspecified: N18.30

## 2014-05-14 HISTORY — DX: Chronic kidney disease, stage 3 (moderate): N18.3

## 2014-05-14 HISTORY — DX: Type 2 diabetes mellitus with diabetic chronic kidney disease: E11.22

## 2014-05-14 LAB — CBC
HCT: 38.9 % — ABNORMAL LOW (ref 39.0–52.0)
HCT: 38.5 % — ABNORMAL LOW (ref 39.0–52.0)
HEMOGLOBIN: 13.3 g/dL (ref 13.0–17.0)
Hemoglobin: 13 g/dL (ref 13.0–17.0)
MCH: 30.2 pg (ref 26.0–34.0)
MCH: 29.6 pg (ref 26.0–34.0)
MCHC: 34.2 g/dL (ref 30.0–36.0)
MCHC: 33.8 g/dL (ref 30.0–36.0)
MCV: 88.2 fL (ref 78.0–100.0)
MCV: 87.7 fL (ref 78.0–100.0)
Platelets: 168 10*3/uL (ref 150–400)
Platelets: 151 10*3/uL (ref 150–400)
RBC: 4.41 MIL/uL (ref 4.22–5.81)
RBC: 4.39 MIL/uL (ref 4.22–5.81)
RDW: 13.6 % (ref 11.5–15.5)
RDW: 13.4 % (ref 11.5–15.5)
WBC: 9 10*3/uL (ref 4.0–10.5)
WBC: 7 10*3/uL (ref 4.0–10.5)

## 2014-05-14 LAB — URINE MICROSCOPIC-ADD ON

## 2014-05-14 LAB — I-STAT CHEM 8, ED
BUN: 37 mg/dL — ABNORMAL HIGH (ref 6–23)
CALCIUM ION: 1.17 mmol/L (ref 1.13–1.30)
CHLORIDE: 102 meq/L (ref 96–112)
Creatinine, Ser: 2.4 mg/dL — ABNORMAL HIGH (ref 0.50–1.35)
Glucose, Bld: 233 mg/dL — ABNORMAL HIGH (ref 70–99)
HEMATOCRIT: 41 % (ref 39.0–52.0)
Hemoglobin: 13.9 g/dL (ref 13.0–17.0)
Potassium: 5.3 mmol/L — ABNORMAL HIGH (ref 3.5–5.1)
Sodium: 135 mmol/L (ref 135–145)
TCO2: 19 mmol/L (ref 0–100)

## 2014-05-14 LAB — TROPONIN I
TROPONIN I: 0.05 ng/mL — AB (ref <0.031)
TROPONIN I: 0.09 ng/mL — AB (ref <0.031)
TROPONIN I: 0.08 ng/mL — AB (ref <0.031)

## 2014-05-14 LAB — COMPREHENSIVE METABOLIC PANEL
ALT: 113 U/L — AB (ref 0–53)
ANION GAP: 9 (ref 5–15)
AST: 119 U/L — AB (ref 0–37)
Albumin: 2.9 g/dL — ABNORMAL LOW (ref 3.5–5.2)
Alkaline Phosphatase: 169 U/L — ABNORMAL HIGH (ref 39–117)
BUN: 35 mg/dL — ABNORMAL HIGH (ref 6–23)
CALCIUM: 9 mg/dL (ref 8.4–10.5)
CO2: 22 mmol/L (ref 19–32)
Chloride: 103 mEq/L (ref 96–112)
Creatinine, Ser: 2.7 mg/dL — ABNORMAL HIGH (ref 0.50–1.35)
GFR calc non Af Amer: 22 mL/min — ABNORMAL LOW (ref >90)
GFR, EST AFRICAN AMERICAN: 25 mL/min — AB (ref >90)
GLUCOSE: 238 mg/dL — AB (ref 70–99)
Potassium: 5.2 mmol/L — ABNORMAL HIGH (ref 3.5–5.1)
SODIUM: 134 mmol/L — AB (ref 135–145)
TOTAL PROTEIN: 5.6 g/dL — AB (ref 6.0–8.3)
Total Bilirubin: 1 mg/dL (ref 0.3–1.2)

## 2014-05-14 LAB — URINALYSIS, ROUTINE W REFLEX MICROSCOPIC
GLUCOSE, UA: 250 mg/dL — AB
HGB URINE DIPSTICK: NEGATIVE
Ketones, ur: 15 mg/dL — AB
LEUKOCYTES UA: NEGATIVE
NITRITE: NEGATIVE
Protein, ur: 30 mg/dL — AB
SPECIFIC GRAVITY, URINE: 1.024 (ref 1.005–1.030)
Urobilinogen, UA: 1 mg/dL (ref 0.0–1.0)
pH: 5 (ref 5.0–8.0)

## 2014-05-14 LAB — BRAIN NATRIURETIC PEPTIDE
B NATRIURETIC PEPTIDE 5: 330.4 pg/mL — AB (ref 0.0–100.0)
B Natriuretic Peptide: 523.8 pg/mL — ABNORMAL HIGH (ref 0.0–100.0)

## 2014-05-14 LAB — CREATININE, SERUM
CREATININE: 2.36 mg/dL — AB (ref 0.50–1.35)
GFR calc Af Amer: 29 mL/min — ABNORMAL LOW (ref >90)
GFR calc non Af Amer: 25 mL/min — ABNORMAL LOW (ref >90)

## 2014-05-14 LAB — PROTIME-INR
INR: 1.09 (ref 0.00–1.49)
INR: 1.12 (ref 0.00–1.49)
Prothrombin Time: 14.2 seconds (ref 11.6–15.2)
Prothrombin Time: 14.5 seconds (ref 11.6–15.2)

## 2014-05-14 LAB — I-STAT TROPONIN, ED
TROPONIN I, POC: 0.04 ng/mL (ref 0.00–0.08)
Troponin i, poc: 0.02 ng/mL (ref 0.00–0.08)

## 2014-05-14 LAB — TSH: TSH: 0.317 u[IU]/mL — AB (ref 0.350–4.500)

## 2014-05-14 LAB — MRSA PCR SCREENING: MRSA BY PCR: NEGATIVE

## 2014-05-14 SURGERY — TEMPORARY PACEMAKER INSERTION
Anesthesia: LOCAL

## 2014-05-14 MED ORDER — NORTRIPTYLINE HCL 25 MG PO CAPS
25.0000 mg | ORAL_CAPSULE | Freq: Every day | ORAL | Status: DC
  Administered 2014-05-14 – 2014-05-16 (×3): 25 mg via ORAL
  Filled 2014-05-14 (×5): qty 1

## 2014-05-14 MED ORDER — ONDANSETRON HCL 4 MG/2ML IJ SOLN
4.0000 mg | Freq: Once | INTRAMUSCULAR | Status: AC
  Administered 2014-05-14: 4 mg via INTRAVENOUS

## 2014-05-14 MED ORDER — SODIUM CHLORIDE 0.9 % IJ SOLN
3.0000 mL | INTRAMUSCULAR | Status: DC | PRN
  Administered 2014-05-15: 3 mL via INTRAVENOUS
  Filled 2014-05-14: qty 3

## 2014-05-14 MED ORDER — ACETAMINOPHEN 325 MG PO TABS
650.0000 mg | ORAL_TABLET | ORAL | Status: DC | PRN
  Administered 2014-05-14: 650 mg via ORAL
  Filled 2014-05-14: qty 2

## 2014-05-14 MED ORDER — SODIUM CHLORIDE 0.9 % IV BOLUS (SEPSIS)
1000.0000 mL | Freq: Once | INTRAVENOUS | Status: AC
  Administered 2014-05-14: 1000 mL via INTRAVENOUS

## 2014-05-14 MED ORDER — LEVOTHYROXINE SODIUM 125 MCG PO TABS
125.0000 ug | ORAL_TABLET | Freq: Every day | ORAL | Status: DC
  Administered 2014-05-15 – 2014-05-17 (×3): 125 ug via ORAL
  Filled 2014-05-14 (×5): qty 1

## 2014-05-14 MED ORDER — PANTOPRAZOLE SODIUM 40 MG PO TBEC
40.0000 mg | DELAYED_RELEASE_TABLET | Freq: Every day | ORAL | Status: DC
  Administered 2014-05-14 – 2014-05-17 (×4): 40 mg via ORAL
  Filled 2014-05-14 (×4): qty 1

## 2014-05-14 MED ORDER — ASPIRIN 81 MG PO CHEW
324.0000 mg | CHEWABLE_TABLET | Freq: Once | ORAL | Status: AC
  Administered 2014-05-14: 324 mg via ORAL
  Filled 2014-05-14: qty 4

## 2014-05-14 MED ORDER — SODIUM BICARBONATE 8.4 % IV SOLN
50.0000 meq | Freq: Once | INTRAVENOUS | Status: AC
  Administered 2014-05-14: 50 meq via INTRAVENOUS

## 2014-05-14 MED ORDER — LIDOCAINE HCL (PF) 1 % IJ SOLN
INTRAMUSCULAR | Status: AC
  Filled 2014-05-14: qty 30

## 2014-05-14 MED ORDER — ASPIRIN EC 81 MG PO TBEC
81.0000 mg | DELAYED_RELEASE_TABLET | Freq: Every day | ORAL | Status: DC
  Administered 2014-05-15 – 2014-05-17 (×3): 81 mg via ORAL
  Filled 2014-05-14 (×3): qty 1

## 2014-05-14 MED ORDER — SODIUM CHLORIDE 0.9 % IJ SOLN
3.0000 mL | Freq: Two times a day (BID) | INTRAMUSCULAR | Status: DC
  Administered 2014-05-14 – 2014-05-17 (×7): 3 mL via INTRAVENOUS

## 2014-05-14 MED ORDER — SIMVASTATIN 40 MG PO TABS
40.0000 mg | ORAL_TABLET | Freq: Every day | ORAL | Status: DC
  Administered 2014-05-14 – 2014-05-15 (×2): 40 mg via ORAL
  Filled 2014-05-14 (×3): qty 1

## 2014-05-14 MED ORDER — NITROGLYCERIN 0.4 MG SL SUBL: 0.4000 mg | SUBLINGUAL_TABLET | SUBLINGUAL | Status: DC | PRN

## 2014-05-14 MED ORDER — FENTANYL CITRATE 0.05 MG/ML IJ SOLN
INTRAMUSCULAR | Status: AC
  Filled 2014-05-14: qty 2

## 2014-05-14 MED ORDER — MIDAZOLAM HCL 2 MG/2ML IJ SOLN
INTRAMUSCULAR | Status: AC
  Filled 2014-05-14: qty 2

## 2014-05-14 MED ORDER — ATROPINE SULFATE 0.1 MG/ML IJ SOLN
INTRAMUSCULAR | Status: AC
Start: 2014-05-14 — End: 2014-05-14
  Administered 2014-05-14: 1 mg
  Filled 2014-05-14: qty 10

## 2014-05-14 MED ORDER — ASPIRIN 81 MG PO CHEW: 324.0000 mg | CHEWABLE_TABLET | ORAL | Status: DC

## 2014-05-14 MED ORDER — INSULIN ASPART 100 UNIT/ML IV SOLN
INTRAVENOUS | Status: AC
  Filled 2014-05-14: qty 1

## 2014-05-14 MED ORDER — ALBUTEROL SULFATE (2.5 MG/3ML) 0.083% IN NEBU
5.0000 mg | INHALATION_SOLUTION | Freq: Once | RESPIRATORY_TRACT | Status: AC
  Administered 2014-05-14: 5 mg via RESPIRATORY_TRACT
  Filled 2014-05-14: qty 6

## 2014-05-14 MED ORDER — ONDANSETRON HCL 4 MG/2ML IJ SOLN: 4.0000 mg | Freq: Four times a day (QID) | INTRAMUSCULAR | Status: DC | PRN

## 2014-05-14 MED ORDER — DEXTROSE 50 % IV SOLN
0.5000 | Freq: Once | INTRAVENOUS | Status: AC
  Administered 2014-05-14: 25 mL via INTRAVENOUS

## 2014-05-14 MED ORDER — LORAZEPAM 2 MG/ML IJ SOLN
INTRAMUSCULAR | Status: AC
  Filled 2014-05-14: qty 1

## 2014-05-14 MED ORDER — SODIUM CHLORIDE 0.9 % IV SOLN: INTRAVENOUS | Status: DC

## 2014-05-14 MED ORDER — SODIUM CHLORIDE 0.9 % IV SOLN: 250.0000 mL | INTRAVENOUS | Status: DC | PRN

## 2014-05-14 MED ORDER — CALCIUM CHLORIDE 10 % IV SOLN
1.0000 g | Freq: Once | INTRAVENOUS | Status: AC
  Administered 2014-05-14: 1 g via INTRAVENOUS

## 2014-05-14 MED ORDER — SODIUM CHLORIDE 0.9 % IV SOLN: 1.0000 g | Freq: Once | INTRAVENOUS | Status: DC

## 2014-05-14 MED ORDER — FENTANYL CITRATE 0.05 MG/ML IJ SOLN
50.0000 ug | Freq: Once | INTRAMUSCULAR | Status: AC
  Administered 2014-05-14: 50 ug via INTRAVENOUS

## 2014-05-14 MED ORDER — ONDANSETRON HCL 4 MG/2ML IJ SOLN
INTRAMUSCULAR | Status: AC
  Filled 2014-05-14: qty 2

## 2014-05-14 MED ORDER — HYDRALAZINE HCL 20 MG/ML IJ SOLN
10.0000 mg | Freq: Four times a day (QID) | INTRAMUSCULAR | Status: DC | PRN
  Administered 2014-05-14 – 2014-05-15 (×2): 10 mg via INTRAVENOUS
  Filled 2014-05-14 (×2): qty 1

## 2014-05-14 MED ORDER — ASPIRIN 300 MG RE SUPP: 300.0000 mg | RECTAL | Status: DC

## 2014-05-14 MED ORDER — GABAPENTIN 600 MG PO TABS
600.0000 mg | ORAL_TABLET | Freq: Two times a day (BID) | ORAL | Status: DC
  Administered 2014-05-14 – 2014-05-17 (×6): 600 mg via ORAL
  Filled 2014-05-14 (×8): qty 1

## 2014-05-14 MED ORDER — HEPARIN SODIUM (PORCINE) 5000 UNIT/ML IJ SOLN
5000.0000 [IU] | Freq: Three times a day (TID) | INTRAMUSCULAR | Status: DC
  Administered 2014-05-14 – 2014-05-17 (×7): 5000 [IU] via SUBCUTANEOUS
  Filled 2014-05-14 (×12): qty 1

## 2014-05-14 MED ORDER — HEPARIN (PORCINE) IN NACL 2-0.9 UNIT/ML-% IJ SOLN
INTRAMUSCULAR | Status: AC
  Filled 2014-05-14: qty 500

## 2014-05-14 MED ORDER — INSULIN ASPART 100 UNIT/ML ~~LOC~~ SOLN
7.0000 [IU] | Freq: Once | SUBCUTANEOUS | Status: AC
  Administered 2014-05-14: 7 [IU] via INTRAVENOUS

## 2014-05-14 MED FILL — Medication: Qty: 1 | Status: AC

## 2014-05-14 NOTE — ED Notes (Signed)
Still unable to obtain manual BP.  Per [employer] RN

## 2014-05-14 NOTE — H&P (View-Only) (Signed)
   Patient ID: Christopher Padilla MRN: [id number], DOB/AGE: 11/23/1940   Admit date: 05/14/2014   Primary Physician: Haliburton, Mae E, FNP Primary Cardiologist: Dr. Hochrein   Pt. Profile: He is a 74 y.o. 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   Most recently presented to [facility] with syncope (discharged 04/18/14) - etiology unclear. No significant arrhythmia but a 4 beat run of NSVT. New wall motion abnormality on echo noted. EF previously 50-55% and now down to 40 to 45%. Was planned for cardiac cath but cancelled due to elevated creatinine. Felt to be high risk for contrast induced nephropathy due to extensive PAD, DM and CKD. Plan was to let him go home, let his creatinine return to baseline and then proceed with cath (left femoral approach). To consider loop recorder once his coronary status was known. He underwent LHC on 05/10/14 which revealed  Impression: 1. Severe triple vessel CAD s/p CABG with 3/4 patent bypass grafts 2. Occluded SVG to Diagonal 3. Moderate stenosis in the patent free radial artery graft. This does not appear to be flow limiting. Since this is an arterial graft, I do not think this moderate stenosis is contributing to his isolated episode of syncope or his cardiomyopathy.  4. No symptoms of unstable angina Recommendations: Continue medical management of CAD  Today he presents from him PCP's office with complete heart block. He had synopsized 3-4 weeks ago and been discharged from the hospital 04/18/14 with unclear etiology. He had a mechanical fall after that which caused a significant hematoma on his back. He had an x ray done last week that was unremarkable. He continued to have pain and was set up in PCP  clinic today for a repeat CXR. He reported not feeling unwell in the clinic. An ECG revealed CHB and he was sent to [facility] for further evaluation. Reportedly had HRs 12-18bmp and K of 8.5 upon arrival. Given Calcium gluconate, insulin and bicarb.   He started feeling weak yesterday afternoon and woke up this morning feeling so weak he could not move. He feels dizzy , but no more syncopal episodes. Continues to feel dizzy now. No CP. Some mild dyspnea yesterday.    Problem List  Past Medical History  Diagnosis Date  . CAD (coronary artery disease)     S/P CABG, Feb 2003, LIMA to the LAD, left free radial artery to an obtuse marginal, spahenous vein graft to diagonal, saphenous vein graft to RCA with endarterectomy  . S/P below knee amputation   . Cerebrovascular disease     MRA in 2005 with 75% stenosis in distal right vertebral artery and 75% stenosis greater in distal cervical internal carotid artery on teh right, severe bilateral disease and MRA of the extracranial circulation, high grade stenosis of the distal right internal carotid artery at the junction of the cervical internal carotid artery of the skull base  . Diabetes mellitus   . Hyperlipidemia   . Hypothyroidism   . History of tobacco use     Quit in 1990  . Myocardial infarction 2002  . GERD (gastroesophageal reflux disease)   . Arthritis   . Cataract     Past Surgical History  Procedure Laterality Date  . Leg amputation below   knee      Bilateral  . Arterial bypass surgry      Left; left femoral to posterior tibial bypass gracting, left femoral artery and deep femoral artery endarterectomy with vein patch angioplasty of the common femoral arter and deep femoral artery 06/2005, right fremoral popliteal bypass, right iliac artery restent, bilateral below knee amputations  . Thoracotomy      With drainage of a hemathroax and decortication of fibrothorax  . Coronary artery bypass graft  2002    By Peter Van Trigt, MD  . Cataract  extraction      Bilateral  . Carpal tunnel release      Right  . Amputation  10/17/2011    Procedure: AMPUTATION DIGIT;  Surgeon: Marcus V Duda, MD;  Location: [facility] OR;  Service: Orthopedics;  Laterality: Left;  Amputation Left Index Finger at PIP Joint  . Finger surgery Right     pointer  . Left heart catheterization with coronary angiogram N/A 05/10/2014    Procedure: LEFT HEART CATHETERIZATION WITH CORONARY ANGIOGRAM;  Surgeon: Christopher D McAlhany, MD;  Location: [facility] CATH LAB;  Service: Cardiovascular;  Laterality: N/A;     Allergies  Allergies  Allergen Reactions  . Codeine     hallucinations  . Ciprofloxacin Diarrhea  . Nsaids Other (See Comments)    GI upset     Home Medications  Prior to Admission medications   Medication Sig Start Date End Date Taking? Authorizing Provider  aspirin 81 MG tablet Take 81 mg by mouth daily.     Yes Historical Provider, MD  BD INSULIN SYRINGE ULTRAFINE 31G X 15/64" 0.5 ML MISC USE AS DIRECTED   Yes Donald W Moore, MD  carvedilol (COREG) 3.125 MG tablet Take 1 tablet (3.125 mg total) by mouth 2 (two) times daily with a meal. 04/22/14  Yes Abraham Feliz Ortiz, MD  Cholecalciferol (VITAMIN D PO) Take 1 tablet by mouth 2 (two) times daily.   Yes Historical Provider, MD  gabapentin (NEURONTIN) 600 MG tablet TAKE 1 CAPSULE BY MOUTH THREE TIMES A DAY Patient taking differently: Take 600 mg by mouth 2 (two) times daily.  01/17/14  Yes Donald W Moore, MD  insulin glargine (LANTUS) 100 UNIT/ML injection INJECT 4O UNITS SUBCUTANEOUSLY AT BEDTIME Patient taking differently: Inject 35 Units into the skin at bedtime.  01/17/14  Yes Donald W Moore, MD  insulin lispro (HUMALOG) 100 UNIT/ML injection INJECT 0-20 UNITS INTO THE SKIN AS DIRECTED. SLIDING SCALE 01/17/14  Yes Donald W Moore, MD  levothyroxine (SYNTHROID, LEVOTHROID) 125 MCG tablet TAKE 1 TABLET BY MOUTH DAILY 01/17/14  Yes Donald W Moore, MD  Menthol-Methyl Salicylate (MUSCLE RUB) 10-15 % CREA Apply 1  application topically daily as needed for muscle pain.   Yes Historical Provider, MD  nortriptyline (PAMELOR) 25 MG capsule TAKE 1 CAPSULE BY MOUTH AT BEDTIME 01/17/14  Yes Donald W Moore, MD  omeprazole (PRILOSEC) 20 MG capsule TAKE 1 CAPSULE BY MOUTH DAILY 01/17/14  Yes Donald W Moore, MD  ONE TOUCH ULTRA TEST test strip USE TO TEST SUGAR 4 TIMES DAILY   Yes Donald W Moore, MD  simvastatin (ZOCOR) 40 MG tablet Take 1 tablet (40 mg total) by mouth at bedtime. 01/17/14  Yes Donald W Moore, MD    Family History  Family History  Problem Relation Age of Onset  . Cancer Mother     brain and lung  . Diabetes Father 60  . Coronary artery disease Father   . Colon cancer Neg   Hx   . Diabetes Sister   . Diabetes Brother   . Diabetes Son    Family Status  Relation Status Death Age  . Mother Deceased 47  . Father Deceased 82  . Sister Alive   . Brother Alive   . Son Alive   . Brother Alive      Social History  History   Social History  . Marital Status: Married    Spouse Name: N/A    Number of Children: N/A  . Years of Education: N/A   Occupational History  . Retired    Social History Main Topics  . Smoking status: Former Smoker -- 1.00 packs/day    Types: Cigarettes    Start date: 11/04/1954    Quit date: 05/06/1988  . Smokeless tobacco: Never Used  . Alcohol Use: No  . Drug Use: No  . Sexual Activity: Not Currently   Other Topics Concern  . Not on file   Social History Narrative   Married with 2 children     All other systems reviewed and are otherwise negative except as noted above.  Physical Exam  Blood pressure 162/42, pulse 32, temperature 97.5 F (36.4 C), temperature source Oral, resp. rate 15, height 5' 8" (1.727 m), weight 162 lb (73.483 kg), SpO2 93 %.  General: Pleasant, NAD, chronically ill appearing.  Psych: Normal affect. Neuro: Alert and oriented X 3. Moves all extremities spontaneously. HEENT: Normal  Neck: Supple without bruits or JVD. Lungs:   Resp regular and unlabored, CTA. Heart: RRR no s3, s4, + murmurs. Abdomen: Soft, non-tender, non-distended, BS + x 4.  Extremities: No clubbing, cyanosis or edema. Bilateral BKAs  Labs  No results for input(s): CKTOTAL, CKMB, TROPONINI in the last 72 hours. Lab Results  Component Value Date   WBC 9.0 05/14/2014   HGB 13.3 05/14/2014   HCT 38.9* 05/14/2014   MCV 88.2 05/14/2014   PLT 168 05/14/2014    Recent Labs Lab 05/09/14 1100  NA 137  K 4.4  CL 102  CO2 29  BUN 16  CREATININE 1.3  CALCIUM 8.8  GLUCOSE 280*   Lab Results  Component Value Date   CHOL 94* 01/31/2014   HDL 43 01/31/2014   LDLCALC 40 01/31/2014   TRIG 53 01/31/2014    Radiology/Studies  Dg Chest 2 View  04/18/2014   CLINICAL DATA:  Syncope.  EXAM: CHEST  2 VIEW  COMPARISON:  August 30, 2013.  FINDINGS: Stable cardiomediastinal silhouette. Status post coronary artery bypass graft. Stable blunting of left costophrenic sulcus is noted most consistent with scarring. No pneumothorax is noted. No significant pleural effusion is noted. No acute pulmonary disease is noted. Postsurgical changes are again noted involving the left ribs.  IMPRESSION: Postsurgical changes as described above. No acute cardiopulmonary abnormality seen.   Electronically Signed   By: James  Green M.D.   On: 04/18/2014 18:00   Dg Lumbar Spine 2-3 Views  05/09/2014   CLINICAL DATA:  Status post fall, initial visit  EXAM: LUMBAR SPINE - 2-3 VIEW  COMPARISON:  None.  FINDINGS: The lumbar vertebral bodies are preserved in height. The intervertebral disc space heights are reasonably well maintained. The pedicles and transverse processes are intact. There is lucency that projects through the pars region of L5 on the lateral view which may reflect bilateral pars defects. There is no significant spondylolisthesis. The observed portions of the sacrum are unremarkable. A right common iliac artery stent is visible.  IMPRESSION: There is mild   degenerative  endplate spurring at multiple lumbar levels without high-grade disc space narrowing or compression fracture. 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 This could be evaluated further with lumbar spine CT scanning.   Electronically Signed   By: David  Jordan   On: 05/09/2014 16:48   Ct Head Wo Contrast  04/18/2014   CLINICAL DATA:  Syncope  EXAM: CT HEAD WITHOUT CONTRAST  TECHNIQUE: Contiguous axial images were obtained from the base of the skull through the vertex without intravenous contrast.  COMPARISON:  Multiple exams, including 08/30/2003 and 08/29/2003  FINDINGS: Faint hypodensity in the central upper pons and in the periventricular white matter compatible with chronic ischemic microvascular white matter disease. Remote lacunar infarcts in the right putamen in the and right thalamus. Remote lacunar infarct in the medial left thalamus.  No intracranial hemorrhage, mass lesion, or acute CVA.  There is left pneumoparotid. Mild chronic bilateral maxillary sinusitis. There is atherosclerotic calcification of the cavernous carotid arteries bilaterally. Small right mastoid effusion.  IMPRESSION: 1. No acute intracranial findings. 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 2. Left pneumoparotid. Differential diagnostic considerations include a iatrogenic (recent dental instrumentation, general anesthesia with endotracheal intubation, or spirometry), chronic cough (COPD, cystic fibrosis, allergic rhinitis), and can be seen also in wind instrument musicians, scuba divers, and glass blowers.   Electronically Signed   By: Walt  Liebkemann M.D.   On: 04/18/2014 17:55   Ct Angio Chest Pe W/cm &/or Wo Cm  04/19/2014   CLINICAL DATA:  Syncopal episode yesterday, positive D-dimer  EXAM: CT ANGIOGRAPHY CHEST WITH CONTRAST  TECHNIQUE: Multidetector CT imaging of the chest was performed using the standard protocol during bolus administration of intravenous contrast. Multiplanar CT image  reconstructions and MIPs were obtained to evaluate the vascular anatomy.  CONTRAST:  67mL OMNIPAQUE IOHEXOL 350 MG/ML SOLN  COMPARISON:  None.  FINDINGS: Sagittal images of the spine shows mild degenerative changes thoracic spine. The patient is status post median sternotomy.  Images of the thoracic inlet are unremarkable. Central airways are patent. Atherosclerotic calcifications of thoracic aorta and coronary arteries are noted. No pericardial effusion. Visualized upper abdomen shows tiny calcified flattening gallstones within gallbladder. No adrenal gland mass is noted in visualized upper abdomen.  The study is of excellent technical quality. No pulmonary embolus is noted.  There is prior left posterior rib resection. Old left rib fracture. Small left pleural effusion with left base pleural thickening. There are diaphragmatic and basilar posterior pleural calcifications on left side. Probable sequela from prior infection or inflammatory process.  There is some atelectasis or scarring in left upper lobe posterior medially along the fissure. No acute infiltrate or pulmonary edema. There is no mediastinal hematoma or adenopathy. No hilar adenopathy. No axillary adenopathy. Status post CABG.  Review of the MIP images confirms the above findings.  IMPRESSION: 1. No pulmonary embolus is identified. 2. No acute infiltrate or pulmonary edema. Small left pleural effusion. There is pleural thickening and there are left base posterior and left diaphragmatic pleural calcifications probable sequela from prior infection or inflammation. Postsurgical changes left posterior ribs. 3. No mediastinal hematoma or adenopathy. 4. Atherosclerotic calcifications of thoracic aorta and coronary arteries. Status post CABG. 5. Small layering calcified gallstones are noted within gallbladder.   Electronically Signed   By: Liviu  Pop M.D.   On: 04/19/2014 10:50   Dg Chest Portable 1 View  05/14/2014   CLINICAL DATA:  Chest pressure. History  of CAD, diabetes and hyperlipidemia    EXAM: PORTABLE CHEST - 1 VIEW  COMPARISON:  04/18/2014; 08/30/2013; 06/21/2005; chest CT - 04/19/2014  FINDINGS: Grossly unchanged borderline enlarged cardiac silhouette and mediastinal contours post median sternotomy and CABG. External pacer devices overlie the cardiac apex. Mild pulmonary venous congestion without frank evidence of edema. There is chronic blunting of the left costophrenic angle without definite pleural effusion. No new focal airspace opacities. No pneumothorax. Unchanged bones.  IMPRESSION: Pulmonary venous congestion without definite acute cardiopulmonary disease on this AP portable examination.   Electronically Signed   By: John  Watts M.D.   On: 05/14/2014 11:22    ECG  Complete heart block, PVCs, RBBB, HR 44  ASSESSMENT AND PLAN  He is a 74 y.o. male with history of CAD s/p CABG  (2003), repeat LHC on 05/10/14 with 3/4 patent grafts and occulded SVG, non ischemic CM EF of 40-45%, bilateral BKA, bilateral carotid dz, hypothyroidism, uncontrolled DM, HLD and CKD and diabetes mellitus who presented from his PCPs office today with symptomatic CHB.  CHB- will admit to unit. [facility] called and will place an urgent temp wire with Dr. Cooper. -- Will likely need EP to see on Monday for permanent pacemaker placement -- Hold coreg for now  Hyperkalemia- no labs reported in epic, but per nursing report K 8.5. Given Ca gluconate, insulin/D50 and bicarb. This may have been a lab error as his K was normal 3 days ago.  -- Repeat K 5.3.   CAD s/p CABG (2003) --  He underwent LHC on 05/10/14 which revealed  Impression:  1. Severe triple vessel CAD s/p CABG with 3/4 patent bypass grafts  2. Occluded SVG to Diagonal  3. Moderate stenosis in the patent free radial artery graft. This does not appear to be flow limiting. Since this is an arterial graft, I do not think this moderate stenosis is contributing to his isolated episode of syncope or his  cardiomyopathy.   4. No symptoms of unstable angina -- Recommendations: Continue medical management of CAD -- Will cycle cardiac markers  Non-ischemic cardiomyopathy. EF 40-45%. S/p LHC on 05/10/14. Ischemia not thought to be the cause of new CM -- No evidence of volume overload. BNP 330.  -- Hold coreg due to bradycardia.   PAD s/p bilateral BKA.   Acute on CKD stage 3. Creatinine increased post CT. Baseline 1.3-1.4  -- Creat 2.4 today.   DM type 2 on insulin.   HLD- cont statin  Carotid disease - dopplers 04/27/14 with 60-79% RICA stenosis and 40-59% LICA stenosis. F/u studies recommended in 6 months.   Signed, THOMPSON, KATHRYN R, PA-C 05/14/2014, 11:31 AM  Pager [phone]   Attending note:  Extensive records reviewed, patient seen and examined, discussed with Ms. Thompson PA-C. Patient has a history of CAD status post CABG in 2003, just recently status post heart catheterization on January 5 demonstrating 3 or 4 patent bypass grafts and LVEF in the range of 40-45%. He had actually presented back in December 2015 after an episode of unexplained syncope, no distinct arrhythmias were noted with the exception of a brief episode of NSVT, and he was managed medically. Of note, heart catheterization had originally been deferred in December due to renal insufficiency with creatinine up to 1.9. Most recently on December 4 his potassium was 4.4 and his creatinine was 1.3. He states that yesterday he began to feel "bad" describing generalized lethargy and weakness. This was the case again this morning, and he was already scheduled to see his primary care provider   today in [place] (saw Ms. Halburton NP). He came in actually for follow-up of lower back pain after his prior fall, although due to his symptoms an ECG was obtained showing complete heart block and he was transferred to the [place] ER. Patient reportedly had an i-STAT potassium of 8.1 at presentation, however follow-up repeat found  potassium of 5.3 (this was however after treatment for hyperkalemia by ER staff). He has remained in complete heart block with right bundle branch block, heart rate 30 by ECG, hemodynamically stable with intermittent hypotension. No chest pain. Initial troponin I is negative. Creatinine is elevated to 2.4. He is on low-dose Coreg at home. On examination lungs are clear, slow heart rate noted without gallop, abdomen nontender, mild ankle edema. I reviewed the situation with the patient and his son at bedside. Plan is for him to undergo placement of a temporary pacing wire per Dr. Cooper in the cath lab for further stabilization. Coreg will be held, and rhythm can be followed to see if there is any reversibility. Seems unlikely that high normal potassium level is related, although could have been higher at presentation in light of renal insufficiency and suspected contrast nephropathy. Gentle hydration as tolerated. Will need to be seen by EP for permanent device pending clinical course.  Jekhi Bolin G. Jonquil Stubbe, M.D., F.A.C.C.  

## 2014-05-14 NOTE — CV Procedure (Signed)
   Cardiac Catheterization Procedure Note  Name: Christopher Padilla MRN: [id number] DOB: 12-09-1940  Procedure: Temporary pacemaker placement, fluoroscopic guidance  Indication: Complete AV block   Procedural details: Initially the right neck was prepped and draped. Ultrasound was used but the right IJ vein was not well-visualized and it appeared small in caliber behind the carotid artery. I elected to change access to the right femoral vein. The right groin was prepped, draped, and anesthetized with 1% lidocaine. Using modified Seldinger technique, a 6 French sheath was introduced into the right femoral vein. Under fluoroscopic guidance, a balloon-tipped temporary pacemaker wire was advanced without difficulty to the RV apex. The pacemaker was set at 80 bpm and the pacing threshold was < 0.4 mA.  There were no immediate procedural complications. The patient was transferred to the post catheterization recovery area for further monitoring.  Final Conclusions:  Successful placement of temporary transvenous pacing wire.   Recommendations: Hold beta-blocker. EP evaluation.  Tonny Bollman 05/14/2014, 1:33 PM

## 2014-05-14 NOTE — ED Notes (Addendum)
Heart rate dropped to 12, MD  Littie Deeds called and came into room. Now pacing started at 60 BPM per order by Noel Gerold MD.

## 2014-05-14 NOTE — ED Notes (Signed)
Noel Gerold MD, Diona Foley RN, Dahlia Byes RN, [place] Gross RN at bedside.

## 2014-05-14 NOTE — ED Notes (Signed)
Family at bedside. 

## 2014-05-14 NOTE — ED Notes (Addendum)
Pacing paused per order of Leslie Dales MD. HR at 30.

## 2014-05-14 NOTE — ED Notes (Signed)
Dr. McDowell at bedside.  

## 2014-05-14 NOTE — ED Notes (Signed)
Personal belongings: wallett, pants, bka stumps given to family (wife).

## 2014-05-14 NOTE — ED Notes (Addendum)
Per EMS- pt had a fall a week ago. C/o right lower back pain, bruising to front right lower as well abdomen. Pt had a heart cath last Tuesday. Bilateral BKA. 108/46 HR 31. Pt transferred from [facility]. 1 Liter in through 20g PIV left forearm.

## 2014-05-14 NOTE — ED Notes (Signed)
Pt c/o of nausea and states he feels like he is going to pass out. PA at bedside with me and Ardith Dark EMT. Pt sat up and given emesis basin. BP noted to have dropped, PA Sharilyn Sites informed. Pa calling Noel Gerold DM.

## 2014-05-14 NOTE — Progress Notes (Signed)
Patient ID: Christopher Padilla MRN: [id number], DOB/AGE: Dec 27, 1940   Admit date: 05/14/2014   Primary Physician: [name], FNP Primary Cardiologist: Dr. Antoine Poche   Pt. Profile: He is a 74 y.o. 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   Most recently presented to [facility] with syncope (discharged 04/18/14) - etiology unclear. No significant arrhythmia but a 4 beat run of NSVT. New wall motion abnormality on echo noted. EF previously 50-55% and now down to 40 to 45%. Was planned for cardiac cath but cancelled due to elevated creatinine. Felt to be high risk for contrast induced nephropathy due to extensive PAD, DM and CKD. Plan was to let him go home, let his creatinine return to baseline and then proceed with cath (left femoral approach). To consider loop recorder once his coronary status was known. He underwent LHC on 05/10/14 which revealed  Impression: 1. Severe triple vessel CAD s/p CABG with 3/4 patent bypass grafts 2. Occluded SVG to Diagonal 3. Moderate stenosis in the patent free radial artery graft. This does not appear to be flow limiting. Since this is an arterial graft, I do not think this moderate stenosis is contributing to his isolated episode of syncope or his cardiomyopathy.  4. No symptoms of unstable angina Recommendations: Continue medical management of CAD  Today he presents from him PCP's office with complete heart block. He had synopsized 3-4 weeks ago and been discharged from the hospital 04/18/14 with unclear etiology. He had a mechanical fall after that which caused a significant hematoma on his back. He had an x ray done last week that was unremarkable. He continued to have pain and was set up in PCP  clinic today for a repeat CXR. He reported not feeling unwell in the clinic. An ECG revealed CHB and he was sent to [facility] for further evaluation. Reportedly had HRs 12-18bmp and K of 8.5 upon arrival. Given Calcium gluconate, insulin and bicarb.   He started feeling weak yesterday afternoon and woke up this morning feeling so weak he could not move. He feels dizzy , but no more syncopal episodes. Continues to feel dizzy now. No CP. Some mild dyspnea yesterday.    Problem List  Past Medical History  Diagnosis Date  . CAD (coronary artery disease)     S/P CABG, Feb 2003, LIMA to the LAD, left free radial artery to an obtuse marginal, spahenous vein graft to diagonal, saphenous vein graft to RCA with endarterectomy  . S/P below knee amputation   . Cerebrovascular disease     MRA in 2005 with 75% stenosis in distal right vertebral artery and 75% stenosis greater in distal cervical internal carotid artery on teh right, severe bilateral disease and MRA of the extracranial circulation, high grade stenosis of the distal right internal carotid artery at the junction of the cervical internal carotid artery of the skull base  . Diabetes mellitus   . Hyperlipidemia   . Hypothyroidism   . History of tobacco use     Quit in 1990  . Myocardial infarction 2002  . GERD (gastroesophageal reflux disease)   . Arthritis   . Cataract     Past Surgical History  Procedure Laterality Date  . Leg amputation below  knee      Bilateral  . Arterial bypass surgry      Left; left femoral to posterior tibial bypass gracting, left femoral artery and deep femoral artery endarterectomy with vein patch angioplasty of the common femoral arter and deep femoral artery 06/2005, right fremoral popliteal bypass, right iliac artery restent, bilateral below knee amputations  . Thoracotomy      With drainage of a hemathroax and decortication of fibrothorax  . Coronary artery bypass graft  2002    By [name], MD  . Cataract  extraction      Bilateral  . Carpal tunnel release      Right  . Amputation  10/17/2011    Procedure: AMPUTATION DIGIT;  Surgeon: [name], MD;  Location: [facility] OR;  Service: Orthopedics;  Laterality: Left;  Amputation Left Index Finger at PIP Joint  . Finger surgery Right     pointer  . Left heart catheterization with coronary angiogram N/A 05/10/2014    Procedure: LEFT HEART CATHETERIZATION WITH CORONARY ANGIOGRAM;  Surgeon: Kathleene Hazel, MD;  Location: [facility] CATH LAB;  Service: Cardiovascular;  Laterality: N/A;     Allergies  Allergies  Allergen Reactions  . Codeine     hallucinations  . Ciprofloxacin Diarrhea  . Nsaids Other (See Comments)    GI upset     Home Medications  Prior to Admission medications   Medication Sig Start Date End Date Taking? Authorizing Provider  aspirin 81 MG tablet Take 81 mg by mouth daily.     Yes Historical Provider, MD  BD INSULIN SYRINGE ULTRAFINE 31G X 15/64" 0.5 ML MISC USE AS DIRECTED   Yes Ernestina Penna, MD  carvedilol (COREG) 3.125 MG tablet Take 1 tablet (3.125 mg total) by mouth 2 (two) times daily with a meal. 04/22/14  Yes Marinda Elk, MD  Cholecalciferol (VITAMIN D PO) Take 1 tablet by mouth 2 (two) times daily.   Yes Historical Provider, MD  gabapentin (NEURONTIN) 600 MG tablet TAKE 1 CAPSULE BY MOUTH THREE TIMES A DAY Patient taking differently: Take 600 mg by mouth 2 (two) times daily.  01/17/14  Yes Ernestina Penna, MD  insulin glargine (LANTUS) 100 UNIT/ML injection INJECT 4O UNITS SUBCUTANEOUSLY AT BEDTIME Patient taking differently: Inject 35 Units into the skin at bedtime.  01/17/14  Yes Ernestina Penna, MD  insulin lispro (HUMALOG) 100 UNIT/ML injection INJECT 0-20 UNITS INTO THE SKIN AS DIRECTED. SLIDING SCALE 01/17/14  Yes Ernestina Penna, MD  levothyroxine (SYNTHROID, LEVOTHROID) 125 MCG tablet TAKE 1 TABLET BY MOUTH DAILY 01/17/14  Yes Ernestina Penna, MD  Menthol-Methyl Salicylate (MUSCLE RUB) 10-15 % CREA Apply 1  application topically daily as needed for muscle pain.   Yes Historical Provider, MD  nortriptyline (PAMELOR) 25 MG capsule TAKE 1 CAPSULE BY MOUTH AT BEDTIME 01/17/14  Yes Ernestina Penna, MD  omeprazole (PRILOSEC) 20 MG capsule TAKE 1 CAPSULE BY MOUTH DAILY 01/17/14  Yes Ernestina Penna, MD  ONE TOUCH ULTRA TEST test strip USE TO TEST SUGAR 4 TIMES DAILY   Yes Ernestina Penna, MD  simvastatin (ZOCOR) 40 MG tablet Take 1 tablet (40 mg total) by mouth at bedtime. 01/17/14  Yes Ernestina Penna, MD    Family History  Family History  Problem Relation Age of Onset  . Cancer Mother     brain and lung  . Diabetes Father 72  . Coronary artery disease Father   . Colon cancer Neg  Hx   . Diabetes Sister   . Diabetes Brother   . Diabetes Son    Family Status  Relation Status Death Age  . Mother Deceased 28  . Father Deceased 49  . Sister Alive   . Brother Alive   . Son Alive   . Brother Alive      Social History  History   Social History  . Marital Status: Married    Spouse Name: N/A    Number of Children: N/A  . Years of Education: N/A   Occupational History  . Retired    Social History Main Topics  . Smoking status: Former Smoker -- 1.00 packs/day    Types: Cigarettes    Start date: 11/04/1954    Quit date: 05/06/1988  . Smokeless tobacco: Never Used  . Alcohol Use: No  . Drug Use: No  . Sexual Activity: Not Currently   Other Topics Concern  . Not on file   Social History Narrative   Married with 2 children     All other systems reviewed and are otherwise negative except as noted above.  Physical Exam  Blood pressure 162/42, pulse 32, temperature 97.5 F (36.4 C), temperature source Oral, resp. rate 15, height 5\' 8"  (1.727 m), weight 162 lb (73.483 kg), SpO2 93 %.  General: Pleasant, NAD, chronically ill appearing.  Psych: Normal affect. Neuro: Alert and oriented X 3. Moves all extremities spontaneously. HEENT: Normal  Neck: Supple without bruits or JVD. Lungs:   Resp regular and unlabored, CTA. Heart: RRR no s3, s4, + murmurs. Abdomen: Soft, non-tender, non-distended, BS + x 4.  Extremities: No clubbing, cyanosis or edema. Bilateral BKAs  Labs  No results for input(s): CKTOTAL, CKMB, TROPONINI in the last 72 hours. Lab Results  Component Value Date   WBC 9.0 05/14/2014   HGB 13.3 05/14/2014   HCT 38.9* 05/14/2014   MCV 88.2 05/14/2014   PLT 168 05/14/2014    Recent Labs Lab 05/09/14 1100  NA 137  K 4.4  CL 102  CO2 29  BUN 16  CREATININE 1.3  CALCIUM 8.8  GLUCOSE 280*   Lab Results  Component Value Date   CHOL 94* 01/31/2014   HDL 43 01/31/2014   LDLCALC 40 01/31/2014   TRIG 53 01/31/2014    Radiology/Studies  Dg Chest 2 View  04/18/2014   CLINICAL DATA:  Syncope.  EXAM: CHEST  2 VIEW  COMPARISON:  August 30, 2013.  FINDINGS: Stable cardiomediastinal silhouette. Status post coronary artery bypass graft. Stable blunting of left costophrenic sulcus is noted most consistent with scarring. No pneumothorax is noted. No significant pleural effusion is noted. No acute pulmonary disease is noted. Postsurgical changes are again noted involving the left ribs.  IMPRESSION: Postsurgical changes as described above. No acute cardiopulmonary abnormality seen.   Electronically Signed   By: Roque Lias M.D.   On: 04/18/2014 18:00   Dg Lumbar Spine 2-3 Views  05/09/2014   CLINICAL DATA:  Status post fall, initial visit  EXAM: LUMBAR SPINE - 2-3 VIEW  COMPARISON:  None.  FINDINGS: The lumbar vertebral bodies are preserved in height. The intervertebral disc space heights are reasonably well maintained. The pedicles and transverse processes are intact. There is lucency that projects through the pars region of L5 on the lateral view which may reflect bilateral pars defects. There is no significant spondylolisthesis. The observed portions of the sacrum are unremarkable. A right common iliac artery stent is visible.  IMPRESSION: There is mild  degenerative  endplate spurring at multiple lumbar levels without high-grade disc space narrowing or compression fracture. 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 This could be evaluated further with lumbar spine CT scanning.   Electronically Signed   By: David  [place]   On: 05/09/2014 16:48   Ct Head Wo Contrast  04/18/2014   CLINICAL DATA:  Syncope  EXAM: CT HEAD WITHOUT CONTRAST  TECHNIQUE: Contiguous axial images were obtained from the base of the skull through the vertex without intravenous contrast.  COMPARISON:  Multiple exams, including 08/30/2003 and 08/29/2003  FINDINGS: Faint hypodensity in the central upper pons and in the periventricular white matter compatible with chronic ischemic microvascular white matter disease. Remote lacunar infarcts in the right putamen in the and right thalamus. Remote lacunar infarct in the medial left thalamus.  No intracranial hemorrhage, mass lesion, or acute CVA.  There is left pneumoparotid. Mild chronic bilateral maxillary sinusitis. There is atherosclerotic calcification of the cavernous carotid arteries bilaterally. Small right mastoid effusion.  IMPRESSION: 1. No acute intracranial findings. 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 2. Left pneumoparotid. Differential diagnostic considerations include a iatrogenic (recent dental instrumentation, general anesthesia with endotracheal intubation, or spirometry), chronic cough (COPD, cystic fibrosis, allergic rhinitis), and can be seen also in wind instrument musicians, scuba divers, and glass blowers.   Electronically Signed   By: Herbie Baltimore M.D.   On: 04/18/2014 17:55   Ct Angio Chest Pe W/cm &/or Wo Cm  04/19/2014   CLINICAL DATA:  Syncopal episode yesterday, positive D-dimer  EXAM: CT ANGIOGRAPHY CHEST WITH CONTRAST  TECHNIQUE: Multidetector CT imaging of the chest was performed using the standard protocol during bolus administration of intravenous contrast. Multiplanar CT image  reconstructions and MIPs were obtained to evaluate the vascular anatomy.  CONTRAST:  67mL OMNIPAQUE IOHEXOL 350 MG/ML SOLN  COMPARISON:  None.  FINDINGS: Sagittal images of the spine shows mild degenerative changes thoracic spine. The patient is status post median sternotomy.  Images of the thoracic inlet are unremarkable. Central airways are patent. Atherosclerotic calcifications of thoracic aorta and coronary arteries are noted. No pericardial effusion. Visualized upper abdomen shows tiny calcified flattening gallstones within gallbladder. No adrenal gland mass is noted in visualized upper abdomen.  The study is of excellent technical quality. No pulmonary embolus is noted.  There is prior left posterior rib resection. Old left rib fracture. Small left pleural effusion with left base pleural thickening. There are diaphragmatic and basilar posterior pleural calcifications on left side. Probable sequela from prior infection or inflammatory process.  There is some atelectasis or scarring in left upper lobe posterior medially along the fissure. No acute infiltrate or pulmonary edema. There is no mediastinal hematoma or adenopathy. No hilar adenopathy. No axillary adenopathy. Status post CABG.  Review of the MIP images confirms the above findings.  IMPRESSION: 1. No pulmonary embolus is identified. 2. No acute infiltrate or pulmonary edema. Small left pleural effusion. There is pleural thickening and there are left base posterior and left diaphragmatic pleural calcifications probable sequela from prior infection or inflammation. Postsurgical changes left posterior ribs. 3. No mediastinal hematoma or adenopathy. 4. Atherosclerotic calcifications of thoracic aorta and coronary arteries. Status post CABG. 5. Small layering calcified gallstones are noted within gallbladder.   Electronically Signed   By: Natasha Mead M.D.   On: 04/19/2014 10:50   Dg Chest Portable 1 View  05/14/2014   CLINICAL DATA:  Chest pressure. History  of CAD, diabetes and hyperlipidemia  EXAM: PORTABLE CHEST - 1 VIEW  COMPARISON:  04/18/2014; 08/30/2013; 06/21/2005; chest CT - 04/19/2014  FINDINGS: Grossly unchanged borderline enlarged cardiac silhouette and mediastinal contours post median sternotomy and CABG. External pacer devices overlie the cardiac apex. Mild pulmonary venous congestion without frank evidence of edema. There is chronic blunting of the left costophrenic angle without definite pleural effusion. No new focal airspace opacities. No pneumothorax. Unchanged bones.  IMPRESSION: Pulmonary venous congestion without definite acute cardiopulmonary disease on this AP portable examination.   Electronically Signed   By: Simonne Come M.D.   On: 05/14/2014 11:22    ECG  Complete heart block, PVCs, RBBB, HR 44  ASSESSMENT AND PLAN  He is a 74 y.o. male with history of CAD s/p CABG  (2003), repeat LHC on 05/10/14 with 3/4 patent grafts and occulded SVG, non ischemic CM EF of 40-45%, bilateral BKA, bilateral carotid dz, hypothyroidism, uncontrolled DM, HLD and CKD and diabetes mellitus who presented from his PCPs office today with symptomatic CHB.  CHB- will admit to unit. [facility] called and will place an urgent temp wire with Dr. Excell Seltzer. -- Will likely need EP to see on Monday for permanent pacemaker placement -- Hold coreg for now  Hyperkalemia- no labs reported in epic, but per nursing report K 8.5. Given Ca gluconate, insulin/D50 and bicarb. This may have been a lab error as his K was normal 3 days ago.  -- Repeat K 5.3.   CAD s/p CABG (2003) --  He underwent LHC on 05/10/14 which revealed  Impression:  1. Severe triple vessel CAD s/p CABG with 3/4 patent bypass grafts  2. Occluded SVG to Diagonal  3. Moderate stenosis in the patent free radial artery graft. This does not appear to be flow limiting. Since this is an arterial graft, I do not think this moderate stenosis is contributing to his isolated episode of syncope or his  cardiomyopathy.   4. No symptoms of unstable angina -- Recommendations: Continue medical management of CAD -- Will cycle cardiac markers  Non-ischemic cardiomyopathy. EF 40-45%. S/p LHC on 05/10/14. Ischemia not thought to be the cause of new CM -- No evidence of volume overload. BNP 330.  -- Hold coreg due to bradycardia.   PAD s/p bilateral BKA.   Acute on CKD stage 3. Creatinine increased post CT. Baseline 1.3-1.4  -- Creat 2.4 today.   DM type 2 on insulin.   HLD- cont statin  Carotid disease - dopplers 04/27/14 with 60-79% RICA stenosis and 40-59% LICA stenosis. F/u studies recommended in 6 months.   Billy Fischer, PA-C 05/14/2014, 11:31 AM  Pager [phone]   Attending note:  Extensive records reviewed, patient seen and examined, discussed with Ms. [employer]. Patient has a history of CAD status post CABG in 2003, just recently status post heart catheterization on January 5 demonstrating 3 or 4 patent bypass grafts and LVEF in the range of 40-45%. He had actually presented back in December 2015 after an episode of unexplained syncope, no distinct arrhythmias were noted with the exception of a brief episode of NSVT, and he was managed medically. Of note, heart catheterization had originally been deferred in December due to renal insufficiency with creatinine up to 1.9. Most recently on December 4 his potassium was 4.4 and his creatinine was 1.3. He states that yesterday he began to feel "bad" describing generalized lethargy and weakness. This was the case again this morning, and he was already scheduled to see his primary care provider  today in [place] (saw Ms. Halburton NP). He came in actually for follow-up of lower back pain after his prior fall, although due to his symptoms an ECG was obtained showing complete heart block and he was transferred to the [facility] ER. Patient reportedly had an i-STAT potassium of 8.1 at presentation, however follow-up repeat found  potassium of 5.3 (this was however after treatment for hyperkalemia by ER staff). He has remained in complete heart block with right bundle branch block, heart rate 30 by ECG, hemodynamically stable with intermittent hypotension. No chest pain. Initial troponin I is negative. Creatinine is elevated to 2.4. He is on low-dose Coreg at home. On examination lungs are clear, slow heart rate noted without gallop, abdomen nontender, mild ankle edema. I reviewed the situation with the patient and his son at bedside. Plan is for him to undergo placement of a temporary pacing wire per Dr. Excell Seltzer in the cath lab for further stabilization. Coreg will be held, and rhythm can be followed to see if there is any reversibility. Seems unlikely that high normal potassium level is related, although could have been higher at presentation in light of renal insufficiency and suspected contrast nephropathy. Gentle hydration as tolerated. Will need to be seen by EP for permanent device pending clinical course.  Jonelle Sidle, M.D., F.A.C.C.

## 2014-05-14 NOTE — ED Notes (Signed)
Unable to obtain a monitor BP, tech obtaining a manual.

## 2014-05-14 NOTE — ED Notes (Signed)
Unable to obtain manual BP from left arm by [facility]

## 2014-05-14 NOTE — ED Notes (Signed)
Lab called asking for re-draw of cmp r/t potassium being grossly hemolysis. Dr. Diona Browner and Dr. Littie Deeds aware.

## 2014-05-14 NOTE — Progress Notes (Signed)
   Subjective:    Patient ID: Christopher Padilla, male    DOB: 1940/11/26, 74 y.o.   MRN: [id number]  HPI Patient presents today for follow up of fall (occurred on 05/08/2014). He was evaluated for right low back pain, lumbar xray taken, no fractures noted. He reports having pain in right mid back which was present since fall. Reports a sharp, shooting pain when moving right leg that radiates up to right mid back area. Rates pain as 8 of 10 with movement. Reports pain in right mid back upon taking a deep breath. Mr. Bula reports taking tylenol otc as directed, without relief. Reports weakness and chest tightness with ambulation or exertion. Reports having no energy, which has gradually worsened since yesterday.  Patient had cardiac catherization on 05/10/2014.     Review of Systems  Constitutional: Negative for fever and chills.  Respiratory: Positive for chest tightness and shortness of breath. Negative for cough.   Cardiovascular: Negative for chest pain and palpitations.  Gastrointestinal: Negative for abdominal pain.  Musculoskeletal: Positive for back pain.  Neurological: Positive for light-headedness. Negative for dizziness.       Objective:   Physical Exam  Constitutional: He is oriented to person, place, and time. He appears well-developed and well-nourished.  HENT:  Head: Normocephalic and atraumatic.  Cardiovascular: An irregular rhythm present. Bradycardia present.   Pulmonary/Chest: Effort normal. No respiratory distress. He exhibits no tenderness.  Abdominal: Soft. Bowel sounds are normal. There is no tenderness.  Musculoskeletal: He exhibits tenderness.  Right mid back tenderness upon palpation.   Neurological: He is alert and oriented to person, place, and time.  Skin: Skin is warm and dry.  Mild edema noted to mid right back, negative bruising or erythema.   Psychiatric: He has a normal mood and affect.     EKG- Irregular     Assessment & Plan:  1. SOB (shortness  of breath)  - EKG 12-Lead - EKG 12-Lead  2. Chest pain, unspecified chest pain type  - EKG 12-Lead  EMS notified, patient being transported to [facility] (patient preference). Patient wife aware and in agreement. EMS arrived at 9:02am, patient alert and oriented upon being transported.

## 2014-05-14 NOTE — ED Notes (Signed)
Hold off Dopamine.

## 2014-05-14 NOTE — ED Notes (Signed)
Pt. Transported to cath. Lab. Maryellen Pile, RN and Dr. Excell Seltzer accompany pt. Transport uneventful. Report given to cath team.

## 2014-05-14 NOTE — Interval H&P Note (Signed)
History and Physical Interval Note:  05/14/2014 1:06 PM  Christopher Padilla  has presented today for surgery, with the diagnosis of Temp pacer  The various methods of treatment have been discussed with the patient and family. After consideration of risks, benefits and other options for treatment, the patient has consented to  Procedure(s): TEMPORARY PACEMAKER INSERTION (N/A) as a surgical intervention .  The patient's history has been reviewed, patient examined, no change in status, stable for surgery.  I have reviewed the patient's chart and labs.  Questions were answered to the patient's satisfaction.     Tonny Bollman

## 2014-05-14 NOTE — ED Provider Notes (Signed)
CSN: [id number]     Arrival date & time 05/14/14  1014 History   First MD Initiated Contact with Patient 05/14/14 1020     Chief Complaint  Patient presents with  . third degree heart block      (Consider location/radiation/quality/duration/timing/severity/associated sxs/prior Treatment) Patient is a 74 y.o. male presenting with general illness.  Illness Location:  Generalized Quality:  Malaise, lightheadedness Severity:  Moderate Onset quality:  Gradual Duration:  1 day Timing:  Constant Progression:  Worsening Chronicity:  Recurrent Context:  Recent cath with 3/4 patent CABG grafts Relieved by:  Nothing Worsened by:  Nothing Associated symptoms: chest pain (chest pressure) and shortness of breath   Associated symptoms: no nausea and no vomiting     Past Medical History  Diagnosis Date  . CAD (coronary artery disease)     S/P CABG, Feb 2003, LIMA to the LAD, left free radial artery to an obtuse marginal, spahenous vein graft to diagonal, saphenous vein graft to RCA with endarterectomy  . S/P below knee amputation   . Cerebrovascular disease     MRA in 2005 with 75% stenosis in distal right vertebral artery and 75% stenosis greater in distal cervical internal carotid artery on teh right, severe bilateral disease and MRA of the extracranial circulation, high grade stenosis of the distal right internal carotid artery at the junction of the cervical internal carotid artery of the skull base  . Diabetes mellitus   . Hyperlipidemia   . Hypothyroidism   . History of tobacco use     Quit in 1990  . Myocardial infarction 2002  . GERD (gastroesophageal reflux disease)   . Arthritis   . Cataract   . Complete heart block 05/14/2014   Past Surgical History  Procedure Laterality Date  . Leg amputation below knee      Bilateral  . Arterial bypass surgry      Left; left femoral to posterior tibial bypass gracting, left femoral artery and deep femoral artery endarterectomy with vein  patch angioplasty of the common femoral arter and deep femoral artery 06/2005, right fremoral popliteal bypass, right iliac artery restent, bilateral below knee amputations  . Thoracotomy      With drainage of a hemathroax and decortication of fibrothorax  . Coronary artery bypass graft  2002    By [name], MD  . Cataract extraction      Bilateral  . Carpal tunnel release      Right  . Amputation  10/17/2011    Procedure: AMPUTATION DIGIT;  Surgeon: [name], MD;  Location: [facility] OR;  Service: Orthopedics;  Laterality: Left;  Amputation Left Index Finger at PIP Joint  . Finger surgery Right     pointer  . Left heart catheterization with coronary angiogram N/A 05/10/2014    Procedure: LEFT HEART CATHETERIZATION WITH CORONARY ANGIOGRAM;  Surgeon: Kathleene Hazel, MD;  Location[facility] C[facility] LAB;  Service: Cardiovascular;  Laterality: N/A;  . Temporary pacemaker insertion N/A 05/14/2014    Procedure: TEMPORARY PACEMAKER INSERTION;  Surgeon: Micheline Chapman, MD;  Location: [facility] CATH LAB;  Service: Cardiovascular;  Laterality: N/A;   Family History  Problem Relation Age of Onset  . Cancer Mother     brain and lung  . Diabetes Father 83  . Coronary artery disease Father   . Colon cancer Neg Hx   . Diabetes Sister   . Diabetes Brother   . Diabetes Son    History  Substance Use Topics  .  Smoking status: Former Smoker -- 1.00 packs/day    Types: Cigarettes    Start date: 11/04/1954    Quit date: 05/06/1988  . Smokeless tobacco: Never Used  . Alcohol Use: No    Review of Systems  Respiratory: Positive for shortness of breath.   Cardiovascular: Positive for chest pain (chest pressure).  Gastrointestinal: Negative for nausea and vomiting.  All other systems reviewed and are negative.     Allergies  Codeine; Ciprofloxacin; and Nsaids  Home Medications   Prior to Admission medications   Medication Sig Start Date End Date Taking? Authorizing Provider  aspirin 81 MG tablet  Take 81 mg by mouth daily.     Yes Historical Provider, MD  BD INSULIN SYRINGE ULTRAFINE 31G X 15/64" 0.5 ML MISC USE AS DIRECTED   Yes Ernestina Penna, MD  carvedilol (COREG) 3.125 MG tablet Take 1 tablet (3.125 mg total) by mouth 2 (two) times daily with a meal. 04/22/14  Yes Marinda Elk, MD  Cholecalciferol (VITAMIN D PO) Take 1 tablet by mouth 2 (two) times daily.   Yes Historical Provider, MD  gabapentin (NEURONTIN) 600 MG tablet TAKE 1 CAPSULE BY MOUTH THREE TIMES A DAY Patient taking differently: Take 600 mg by mouth 2 (two) times daily.  01/17/14  Yes Ernestina Penna, MD  insulin glargine (LANTUS) 100 UNIT/ML injection INJECT 4O UNITS SUBCUTANEOUSLY AT BEDTIME Patient taking differently: Inject 35 Units into the skin at bedtime.  01/17/14  Yes Ernestina Penna, MD  insulin lispro (HUMALOG) 100 UNIT/ML injection INJECT 0-20 UNITS INTO THE SKIN AS DIRECTED. SLIDING SCALE 01/17/14  Yes Ernestina Penna, MD  levothyroxine (SYNTHROID, LEVOTHROID) 125 MCG tablet TAKE 1 TABLET BY MOUTH DAILY 01/17/14  Yes Ernestina Penna, MD  Menthol-Methyl Salicylate (MUSCLE RUB) 10-15 % CREA Apply 1 application topically daily as needed for muscle pain.   Yes Historical Provider, MD  nortriptyline (PAMELOR) 25 MG capsule TAKE 1 CAPSULE BY MOUTH AT BEDTIME 01/17/14  Yes Ernestina Penna, MD  omeprazole (PRILOSEC) 20 MG capsule TAKE 1 CAPSULE BY MOUTH DAILY 01/17/14  Yes Ernestina Penna, MD  ONE TOUCH ULTRA TEST test strip USE TO TEST SUGAR 4 TIMES DAILY   Yes Ernestina Penna, MD  simvastatin (ZOCOR) 40 MG tablet Take 1 tablet (40 mg total) by mouth at bedtime. 01/17/14  Yes Ernestina Penna, MD   BP 157/50 mmHg  Pulse 74  Temp(Src) 98.5 F (36.9 C) (Oral)  Resp 21  Ht 4\' 10"  (1.473 m)  Wt 150 lb 9.2 oz (68.3 kg)  BMI 31.48 kg/m2  SpO2 96% Physical Exam  Constitutional: He is oriented to person, place, and time. He appears well-developed and well-nourished.  HENT:  Head: Normocephalic and atraumatic.  Eyes:  Conjunctivae and EOM are normal.  Neck: Normal range of motion. Neck supple.  Cardiovascular: Regular rhythm and normal heart sounds.  Bradycardia present.   Pulmonary/Chest: Effort normal and breath sounds normal. No respiratory distress.  Abdominal: He exhibits no distension. There is no tenderness. There is no rebound and no guarding.  Musculoskeletal: Normal range of motion.  Neurological: He is alert and oriented to person, place, and time.  Skin: Skin is warm and dry.  Vitals reviewed.   ED Course  External pacer Date/Time: 05/15/2014 6:51 AM Performed by: Mirian Mo Authorized by: Mirian Mo Consent: The procedure was performed in an emergent situation. Local anesthesia used: no Patient sedated: no Patient tolerance: Patient tolerated the procedure well with no  immediate complications   (including critical care time) Labs Review Labs Reviewed  BRAIN NATRIURETIC PEPTIDE - Abnormal; Notable for the following:    B Natriuretic Peptide 330.4 (*)    All other components within normal limits  URINALYSIS, ROUTINE W REFLEX MICROSCOPIC - Abnormal; Notable for the following:    Color, Urine AMBER (*)    APPearance CLOUDY (*)    Glucose, UA 250 (*)    Bilirubin Urine SMALL (*)    Ketones, ur 15 (*)    Protein, ur 30 (*)    All other components within normal limits  CBC - Abnormal; Notable for the following:    HCT 38.9 (*)    All other components within normal limits  URINE MICROSCOPIC-ADD ON - Abnormal; Notable for the following:    Squamous Epithelial / LPF FEW (*)    Casts GRANULAR CAST (*)    All other components within normal limits  COMPREHENSIVE METABOLIC PANEL - Abnormal; Notable for the following:    Sodium 134 (*)    Potassium 5.2 (*)    Glucose, Bld 238 (*)    BUN 35 (*)    Creatinine, Ser 2.70 (*)    Total Protein 5.6 (*)    Albumin 2.9 (*)    AST 119 (*)    ALT 113 (*)    Alkaline Phosphatase 169 (*)    GFR calc non Af Amer 22 (*)    GFR calc Af  Amer 25 (*)    All other components within normal limits  TROPONIN I - Abnormal; Notable for the following:    Troponin I 0.05 (*)    All other components within normal limits  CBC - Abnormal; Notable for the following:    HCT 38.5 (*)    All other components within normal limits  CREATININE, SERUM - Abnormal; Notable for the following:    Creatinine, Ser 2.36 (*)    GFR calc non Af Amer 25 (*)    GFR calc Af Amer 29 (*)    All other components within normal limits  TSH - Abnormal; Notable for the following:    TSH 0.317 (*)    All other components within normal limits  TROPONIN I - Abnormal; Notable for the following:    Troponin I 0.09 (*)    All other components within normal limits  TROPONIN I - Abnormal; Notable for the following:    Troponin I 0.08 (*)    All other components within normal limits  TROPONIN I - Abnormal; Notable for the following:    Troponin I 0.09 (*)    All other components within normal limits  HEMOGLOBIN A1C - Abnormal; Notable for the following:    Hgb A1c MFr Bld 10.3 (*)    Mean Plasma Glucose 249 (*)    All other components within normal limits  BRAIN NATRIURETIC PEPTIDE - Abnormal; Notable for the following:    B Natriuretic Peptide 523.8 (*)    All other components within normal limits  COMPREHENSIVE METABOLIC PANEL - Abnormal; Notable for the following:    Sodium 134 (*)    Potassium 5.2 (*)    Glucose, Bld 231 (*)    BUN 28 (*)    Creatinine, Ser 1.89 (*)    Total Protein 5.8 (*)    Albumin 3.0 (*)    AST 91 (*)    ALT 118 (*)    Alkaline Phosphatase 161 (*)    GFR calc non Af Amer 33 (*)  GFR calc Af Amer 38 (*)    All other components within normal limits  CBC - Abnormal; Notable for the following:    Hemoglobin 12.6 (*)    HCT 38.4 (*)    Platelets 144 (*)    All other components within normal limits  LIPID PANEL - Abnormal; Notable for the following:    HDL 38 (*)    All other components within normal limits  I-STAT CHEM  8, ED - Abnormal; Notable for the following:    Potassium 5.3 (*)    BUN 37 (*)    Creatinine, Ser 2.40 (*)    Glucose, Bld 233 (*)    All other components within normal limits  MRSA PCR SCREENING  PROTIME-INR  PROTIME-INR  PROTIME-INR  I-STAT TROPOININ, ED  I-STAT CHEM 8, ED  I-STAT TROPOININ, ED    Imaging Review Dg Chest Portable 1 View  05/14/2014   CLINICAL DATA:  Chest pressure. History of CAD, diabetes and hyperlipidemia  EXAM: PORTABLE CHEST - 1 VIEW  COMPARISON:  04/18/2014; 08/30/2013; 06/21/2005; chest CT - 04/19/2014  FINDINGS: Grossly unchanged borderline enlarged cardiac silhouette and mediastinal contours post median sternotomy and CABG. External pacer devices overlie the cardiac apex. Mild pulmonary venous congestion without frank evidence of edema. There is chronic blunting of the left costophrenic angle without definite pleural effusion. No new focal airspace opacities. No pneumothorax. Unchanged bones.  IMPRESSION: Pulmonary venous congestion without definite acute cardiopulmonary disease on this AP portable examination.   Electronically Signed   By: Simonne Come M.D.   On: 05/14/2014 11:22     EKG Interpretation   Date/Time:  Saturday May 14 2014 10:16:29 EST Ventricular Rate:  44 PR Interval:    QRS Duration: 165 QT Interval:  691 QTC Calculation: 591 R Axis:   39 Text Interpretation:  Third degree heart block Multiple ventricular  premature complexes Right bundle branch block Inferior infarct, age  indeterminate Lateral infarct, acute Baseline wander in lead(s) I II aVR  Confirmed by Mirian Mo [phone]) on 05/14/2014 10:24:35 AM     CRITICAL CARE Performed by: Mirian Mo   Total critical care time: 65 min  Critical care time was exclusive of separately billable procedures and treating other patients.  Critical care was necessary to treat or prevent imminent or life-threatening deterioration.  Critical care was time spent personally by me on  the following activities: development of treatment plan with patient and/or surrogate as well as nursing, discussions with consultants, evaluation of patient's response to treatment, examination of patient, obtaining history from patient or surrogate, ordering and performing treatments and interventions, ordering and review of laboratory studies, ordering and review of radiographic studies, pulse oximetry and re-evaluation of patient's condition.  MDM   Final diagnoses:  None    74 y.o. male with pertinent PMH of CAD sp CABG, recent unexplained syncope with new wall motion abnormality but cath 05/10/14 with 3/4 patent grafts presents with malaise, lightheadedness, chest pressure beginning yesterday afternoon.  Patient was seen by family medicine earlier today and had a EKG demonstrating new onset third-degree heart block. He was transferred expeditiously. On arrival blood pressure normal, heart rate 30. Patient complaining of chest pressure but otherwise asymptomatic. Labs and imaging as above ordered.  Consulted cardiology Dr. Diona Browner immediately after initial ECG.  Patient became very nauseous, lightheaded with decrease in heart rate to 12. Blood pressure also 70 systolic. Attempted pacing, atropine. I-STAT labs returned with elevated potassium and greater than 8.5. Calcium, insulin, D50, albuterol  started. This had no change.  Patient also received Zofran and fentanyl. After approximately 30 minutes his symptoms improved. Blood pressure improved to 160 systolic. Heart rate improved to 30.  Admitted in stable condition.    I have reviewed all laboratory and imaging studies if ordered as above  1. Complete Heart Block    Mirian Mo, MD 05/15/14 [id number]

## 2014-05-14 NOTE — ED Notes (Signed)
I-stat critical potassium read to MD [facility] by mini lab or 8.5. Orders obtained.

## 2014-05-15 DIAGNOSIS — I255 Ischemic cardiomyopathy: Secondary | ICD-10-CM | POA: Diagnosis present

## 2014-05-15 DIAGNOSIS — I2510 Atherosclerotic heart disease of native coronary artery without angina pectoris: Secondary | ICD-10-CM | POA: Diagnosis present

## 2014-05-15 LAB — COMPREHENSIVE METABOLIC PANEL
ALK PHOS: 161 U/L — AB (ref 39–117)
ALT: 118 U/L — ABNORMAL HIGH (ref 0–53)
ANION GAP: 6 (ref 5–15)
AST: 91 U/L — ABNORMAL HIGH (ref 0–37)
Albumin: 3 g/dL — ABNORMAL LOW (ref 3.5–5.2)
BUN: 28 mg/dL — AB (ref 6–23)
CO2: 26 mmol/L (ref 19–32)
Calcium: 8.7 mg/dL (ref 8.4–10.5)
Chloride: 102 mEq/L (ref 96–112)
Creatinine, Ser: 1.89 mg/dL — ABNORMAL HIGH (ref 0.50–1.35)
GFR, EST AFRICAN AMERICAN: 38 mL/min — AB (ref >90)
GFR, EST NON AFRICAN AMERICAN: 33 mL/min — AB (ref >90)
GLUCOSE: 231 mg/dL — AB (ref 70–99)
Potassium: 5.2 mmol/L — ABNORMAL HIGH (ref 3.5–5.1)
SODIUM: 134 mmol/L — AB (ref 135–145)
Total Bilirubin: 0.8 mg/dL (ref 0.3–1.2)
Total Protein: 5.8 g/dL — ABNORMAL LOW (ref 6.0–8.3)

## 2014-05-15 LAB — LIPID PANEL
CHOLESTEROL: 91 mg/dL (ref 0–200)
HDL: 38 mg/dL — ABNORMAL LOW (ref >39)
LDL Cholesterol: 40 mg/dL (ref 0–99)
Total CHOL/HDL Ratio: 2.4 RATIO
Triglycerides: 64 mg/dL (ref <150)
VLDL: 13 mg/dL (ref 0–40)

## 2014-05-15 LAB — TROPONIN I: TROPONIN I: 0.09 ng/mL — AB (ref <0.031)

## 2014-05-15 LAB — PROTIME-INR
INR: 1.17 (ref 0.00–1.49)
Prothrombin Time: 15.1 seconds (ref 11.6–15.2)

## 2014-05-15 LAB — GLUCOSE, CAPILLARY: Glucose-Capillary: 432 mg/dL — ABNORMAL HIGH (ref 70–99)

## 2014-05-15 LAB — CBC
HEMATOCRIT: 38.4 % — AB (ref 39.0–52.0)
Hemoglobin: 12.6 g/dL — ABNORMAL LOW (ref 13.0–17.0)
MCH: 29.1 pg (ref 26.0–34.0)
MCHC: 32.8 g/dL (ref 30.0–36.0)
MCV: 88.7 fL (ref 78.0–100.0)
Platelets: 144 10*3/uL — ABNORMAL LOW (ref 150–400)
RBC: 4.33 MIL/uL (ref 4.22–5.81)
RDW: 13.7 % (ref 11.5–15.5)
WBC: 9 10*3/uL (ref 4.0–10.5)

## 2014-05-15 LAB — HEMOGLOBIN A1C
HEMOGLOBIN A1C: 10.3 % — AB (ref <5.7)
Mean Plasma Glucose: 249 mg/dL — ABNORMAL HIGH (ref <117)

## 2014-05-15 MED ORDER — CHLORHEXIDINE GLUCONATE 4 % EX LIQD
60.0000 mL | Freq: Once | CUTANEOUS | Status: AC
  Administered 2014-05-16: 4 via TOPICAL

## 2014-05-15 MED ORDER — SODIUM CHLORIDE 0.45 % IV SOLN
INTRAVENOUS | Status: DC
  Administered 2014-05-16: 07:00:00 via INTRAVENOUS

## 2014-05-15 MED ORDER — INSULIN ASPART 100 UNIT/ML ~~LOC~~ SOLN
5.0000 [IU] | Freq: Once | SUBCUTANEOUS | Status: AC
  Administered 2014-05-15: 5 [IU] via SUBCUTANEOUS

## 2014-05-15 MED ORDER — CHLORHEXIDINE GLUCONATE 4 % EX LIQD
60.0000 mL | Freq: Once | CUTANEOUS | Status: AC
  Administered 2014-05-15: 4 via TOPICAL
  Filled 2014-05-15: qty 60

## 2014-05-15 MED ORDER — GENTAMICIN SULFATE 40 MG/ML IJ SOLN
80.0000 mg | INTRAMUSCULAR | Status: DC
  Filled 2014-05-15: qty 2

## 2014-05-15 MED ORDER — SODIUM CHLORIDE 0.9 % IV SOLN
INTRAVENOUS | Status: DC
  Administered 2014-05-15: 10 mL/h via INTRAVENOUS

## 2014-05-15 MED ORDER — SODIUM CHLORIDE 0.9 % IV SOLN
INTRAVENOUS | Status: DC
  Administered 2014-05-16: 07:00:00 via INTRAVENOUS

## 2014-05-15 MED ORDER — INSULIN GLARGINE 100 UNIT/ML ~~LOC~~ SOLN
10.0000 [IU] | Freq: Once | SUBCUTANEOUS | Status: AC
  Administered 2014-05-15: 10 [IU] via SUBCUTANEOUS
  Filled 2014-05-15: qty 0.1

## 2014-05-15 MED ORDER — INSULIN ASPART 100 UNIT/ML ~~LOC~~ SOLN
0.0000 [IU] | Freq: Every day | SUBCUTANEOUS | Status: DC
  Administered 2014-05-15: 5 [IU] via SUBCUTANEOUS
  Administered 2014-05-16: 2 [IU] via SUBCUTANEOUS

## 2014-05-15 MED ORDER — INSULIN ASPART 100 UNIT/ML ~~LOC~~ SOLN
0.0000 [IU] | Freq: Three times a day (TID) | SUBCUTANEOUS | Status: DC
  Administered 2014-05-16: 5 [IU] via SUBCUTANEOUS
  Administered 2014-05-16: 2 [IU] via SUBCUTANEOUS
  Administered 2014-05-17: 3 [IU] via SUBCUTANEOUS
  Administered 2014-05-17: 5 [IU] via SUBCUTANEOUS

## 2014-05-15 MED ORDER — CEFAZOLIN SODIUM-DEXTROSE 2-3 GM-% IV SOLR
2.0000 g | INTRAVENOUS | Status: DC
  Filled 2014-05-15: qty 50

## 2014-05-15 NOTE — Progress Notes (Signed)
Patient Name: Christopher Padilla Date of Encounter: 05/15/2014  Active Problems:   Complete heart block   Length of Stay: 1  SUBJECTIVE  Feels much better after TPW placed.  When temporary pacing is reduced to 30 bpm, there is no AV conduction or escape rhythm - still in complete heart block  CURRENT MEDS . aspirin EC  81 mg Oral Daily  . gabapentin  600 mg Oral BID  . heparin  5,000 Units Subcutaneous 3 times per day  . levothyroxine  125 mcg Oral QAC breakfast  . nortriptyline  25 mg Oral QHS  . pantoprazole  40 mg Oral Daily  . simvastatin  40 mg Oral QHS  . sodium chloride  3 mL Intravenous Q12H    OBJECTIVE   Intake/Output Summary (Last 24 hours) at 05/15/14 1200 Last data filed at 05/15/14 0700  Gross per 24 hour  Intake    540 ml  Output    750 ml  Net   -210 ml   Filed Weights   05/14/14 1024 05/14/14 1415 05/15/14 0256  Weight: 162 lb (73.483 kg) 147 lb 14.9 oz (67.1 kg) 150 lb 9.2 oz (68.3 kg)    PHYSICAL EXAM Filed Vitals:   05/15/14 0600 05/15/14 0700 05/15/14 0800 05/15/14 0900  BP: 143/56 133/69 181/48 155/66  Pulse: 79 79 81 79  Temp:   98.1 F (36.7 C)   TempSrc:   Oral   Resp: Height:      Weight:      SpO2: 95% 97% 96% 98%   General: Alert, oriented x3, no distress Head: no evidence of trauma, PERRL, EOMI, no exophtalmos or lid lag, no myxedema, no xanthelasma; normal ears, nose and oropharynx Neck: normal jugular venous pulsations and no hepatojugular reflux; brisk carotid pulses without delay and no carotid bruits Chest: clear to auscultation, no signs of consolidation by percussion or palpation, normal fremitus, symmetrical and full respiratory excursions Cardiovascular: normal position and quality of the apical impulse, regular rhythm, normal first and second heart sounds, no rubs or gallops, n0 murmur Abdomen: no tenderness or distention, no masses by palpation, no abnormal pulsatility or arterial bruits, normal bowel sounds,  no hepatosplenomegaly Extremities: no clubbing, cyanosis or edema; 2+ radial, ulnar and brachial pulses bilaterally; 2+ right femoral, posterior tibial and dorsalis pedis pulses; 2+ left femoral, posterior tibial and dorsalis pedis pulses; no subclavian or femoral bruits Neurological: grossly nonfocal  LABS  CBC  Recent Labs  05/14/14 1545 05/15/14 0315  WBC 7.0 9.0  HGB 13.0 12.6*  HCT 38.5* 38.4*  MCV 87.7 88.7  PLT 151 144*   Basic Metabolic Panel  Recent Labs  05/14/14 1205 05/14/14 1215 05/14/14 1545 05/15/14 0315  NA 134* 135  --  134*  K 5.2* 5.3*  --  5.2*  CL 103 102  --  102  CO2 22  --   --  26  GLUCOSE 238* 233*  --  231*  BUN 35* 37*  --  28*  CREATININE 2.70* 2.40* 2.36* 1.89*  CALCIUM 9.0  --   --  8.7   Liver Function Tests  Recent Labs  05/14/14 1205 05/15/14 0315  AST 119* 91*  ALT 113* 118*  ALKPHOS 169* 161*  BILITOT 1.0 0.8  PROT 5.6* 5.8*  ALBUMIN 2.9* 3.0*   No results for input(s): LIPASE, AMYLASE in the last 72 hours. Cardiac Enzymes  Recent Labs  05/14/14 1545 05/14/14 2048 05/15/14 0315  TROPONINI 0.09* 0.08*  0.09*   BNP Invalid input(s): POCBNP D-Dimer No results for input(s): DDIMER in the last 72 hours. Hemoglobin A1C  Recent Labs  05/14/14 1545  HGBA1C 10.3*   Fasting Lipid Panel  Recent Labs  05/15/14 0315  CHOL 91  HDL 38*  LDLCALC 40  TRIG 64  CHOLHDL 2.4   Thyroid Function Tests  Recent Labs  05/14/14 1545  TSH 0.317*    Radiology Studies Imaging results have been reviewed and Dg Chest Portable 1 View  05/14/2014   CLINICAL DATA:  Chest pressure. History of CAD, diabetes and hyperlipidemia  EXAM: PORTABLE CHEST - 1 VIEW  COMPARISON:  04/18/2014; 08/30/2013; 06/21/2005; chest CT - 04/19/2014  FINDINGS: Grossly unchanged borderline enlarged cardiac silhouette and mediastinal contours post median sternotomy and CABG. External pacer devices overlie the cardiac apex. Mild pulmonary venous congestion  without frank evidence of edema. There is chronic blunting of the left costophrenic angle without definite pleural effusion. No new focal airspace opacities. No pneumothorax. Unchanged bones.  IMPRESSION: Pulmonary venous congestion without definite acute cardiopulmonary disease on this AP portable examination.   Electronically Signed   By: Simonne Come M.D.   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 His worsening renal insufficiency and hyperkalemia were caused by the bradycardia (rather than the other way around) and are improving.  Will need device implantation, but would appreciate EP input regarding the choice of device in the setting of mild to moderate cardiomyopathy with anticipation of high frequency of ventricular pacing (conventional dual chamber PPM with option for future upgrade to CRT if HF develops, versus proceeding directly to CRT-P). History of NSVT may also have impact on decision re: ICD, although this is not clearly indicated with the data available. So far, no evidence of complications from temporary pacing wire. Discussed with Dr. Johney Frame by phone and requested consultation.  Thurmon Fair, MD, [facility] [phone] office [phone] pager 05/15/2014 12:00 PM

## 2014-05-15 NOTE — Consult Note (Addendum)
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 male with a h/o CAD, ischemic CM, DM, HTN, and PVD who is admitted with complete heart block.  EP is consulted by Dr Royann Shivers for further evaluation of management of complete heart block.    The patient presented on referral from primary care physicians office where he presented with complete heart block.  He reports symptoms of dizziness and fatigue.  He had a mechanical fall.  Upon arrival to PCPs office, he was noted to have complete heart block.   He reports taking coreg (last dose on Friday).  He had syncope 04/18/14 for which he presented to [facility] and was evaluated.  Cath at that time revealed stable 3v CAD with 3/4 grafts patent.  EF by echo at that time was 40%.  He is s/p BKA but remains active.  He has SOB with moderate activity.  Today, he denies symptoms of palpitations, chest pain,  orthopnea, PND, lower extremity edema, further syncope, or neurologic sequela. The patient is tolerating medications without difficulties and is otherwise without complaint today.   Past Medical History  Diagnosis Date  . CAD (coronary artery disease)     S/P CABG, Feb 2003, LIMA to the LAD, left free radial artery to an obtuse marginal, spahenous vein graft to diagonal, saphenous vein graft to RCA with endarterectomy  . S/P below knee amputation   . Cerebrovascular disease     MRA in 2005 with 75% stenosis in distal right vertebral artery and 75% stenosis greater in distal cervical internal carotid artery on teh right, severe bilateral disease and MRA of the extracranial circulation, high grade stenosis of the distal right internal carotid artery at the junction of the cervical internal carotid artery of the skull base  . Diabetes mellitus   . Hyperlipidemia   . Hypothyroidism   . History of tobacco use     Quit in 1990  . Myocardial infarction 2002  . GERD (gastroesophageal reflux  disease)   . Arthritis   . Cataract   . Complete heart block 05/14/2014   Past Surgical History  Procedure Laterality Date  . Leg amputation below knee      Bilateral  . Arterial bypass surgry      Left; left femoral to posterior tibial bypass gracting, left femoral artery and deep femoral artery endarterectomy with vein patch angioplasty of the common femoral arter and deep femoral artery 06/2005, right fremoral popliteal bypass, right iliac artery restent, bilateral below knee amputations  . Thoracotomy      With drainage of a hemathroax and decortication of fibrothorax  . Coronary artery bypass graft  2002    By [name], MD  . Cataract extraction      Bilateral  . Carpal tunnel release      Right  . Amputation  10/17/2011    Procedure: AMPUTATION DIGIT;  Surgeon: [name], MD;  Location: [facility] OR;  Service: Orthopedics;  Laterality: Left;  Amputation Left Index Finger at PIP Joint  . Finger surgery Right     pointer  . Left heart catheterization with coronary angiogram N/A 05/10/2014    Procedure: LEFT HEART CATHETERIZATION WITH CORONARY ANGIOGRAM;  Surgeon: Kathleene Hazel, MD;  Location: [facility] CATH LAB;  Service: Cardiovascular;  Laterality: N/A;  . Temporary pacemaker insertion N/A 05/14/2014    Procedure: TEMPORARY PACEMAKER INSERTION;  Surgeon: Micheline Chapman, MD;  Location: [facility]  CATH LAB;  Service: Cardiovascular;  Laterality: N/A;    Current Facility-Administered Medications  Medication Dose Route Frequency Provider Last Rate Last Dose  . 0.9 %  sodium chloride infusion  250 mL Intravenous PRN Janetta Hora, PA-C      . 0.9 %  sodium chloride infusion   Intravenous Continuous Micheline Chapman, MD 10 mL/hr at 05/15/14 0100 10 mL/hr at 05/15/14 0100  . acetaminophen (TYLENOL) tablet 650 mg  650 mg Oral Q4H PRN Janetta Hora, PA-C   650 mg at 05/14/14 2056  . aspirin EC tablet 81 mg  81 mg Oral Daily Janetta Hora, PA-C   81 mg at 05/15/14 1055  .  gabapentin (NEURONTIN) tablet 600 mg  600 mg Oral BID Janetta Hora, PA-C   600 mg at 05/15/14 1055  . heparin injection 5,000 Units  5,000 Units Subcutaneous 3 times per day Janetta Hora, PA-C   5,000 Units at 05/15/14 0546  . hydrALAZINE (APRESOLINE) injection 10 mg  10 mg Intravenous Q6H PRN Micheline Chapman, MD   10 mg at 05/14/14 1434  . levothyroxine (SYNTHROID, LEVOTHROID) tablet 125 mcg  125 mcg Oral QAC breakfast Janetta Hora, PA-C   125 mcg at 05/15/14 0756  . nitroGLYCERIN (NITROSTAT) SL tablet 0.4 mg  0.4 mg Sublingual Q5 Min x 3 PRN Janetta Hora, PA-C      . nortriptyline (PAMELOR) capsule 25 mg  25 mg Oral QHS Janetta Hora, PA-C   25 mg at 05/14/14 2059  . ondansetron (ZOFRAN) injection 4 mg  4 mg Intravenous Q6H PRN Janetta Hora, PA-C      . pantoprazole (PROTONIX) EC tablet 40 mg  40 mg Oral Daily Janetta Hora, PA-C   40 mg at 05/15/14 1100  . simvastatin (ZOCOR) tablet 40 mg  40 mg Oral QHS Janetta Hora, PA-C   40 mg at 05/14/14 2059  . sodium chloride 0.9 % injection 3 mL  3 mL Intravenous Q12H Janetta Hora, PA-C   3 mL at 05/15/14 1100  . sodium chloride 0.9 % injection 3 mL  3 mL Intravenous PRN Janetta Hora, PA-C        Allergies  Allergen Reactions  . Codeine     hallucinations  . Ciprofloxacin Diarrhea  . Nsaids Other (See Comments)    GI upset    History   Social History  . Marital Status: Married    Spouse Name: N/A    Number of Children: N/A  . Years of Education: N/A   Occupational History  . Retired    Social History Main Topics  . Smoking status: Former Smoker -- 1.00 packs/day    Types: Cigarettes    Start date: 11/04/1954    Quit date: 05/06/1988  . Smokeless tobacco: Never Used  . Alcohol Use: No  . Drug Use: No  . Sexual Activity: Not Currently   Other Topics Concern  . Not on file   Social History Narrative   Married with 2 children    Family History  Problem Relation Age of  Onset  . Cancer Mother     brain and lung  . Diabetes Father 54  . Coronary artery disease Father   . Colon cancer Neg Hx   . Diabetes Sister   . Diabetes Brother   . Diabetes Son     ROS- All systems are reviewed and negative except as per the HPI above  Physical Exam:  Filed Vitals:   05/15/14 0600 05/15/14 0700 05/15/14 0800 05/15/14 0900  BP: 143/56 133/69 181/48 155/66  Pulse: 79 79 81 79  Temp:   98.1 F (36.7 C)   TempSrc:   Oral   Resp: Height:      Weight:      SpO2: 95% 97% 96% 98%    GEN- The patient is well appearing, alert and oriented x 3 today.   Head- normocephalic, atraumatic Eyes-  Sclera clear, conjunctiva pink Ears- hearing intact Oropharynx- clear Neck- supple,  Lungs- Clear to ausculation bilaterally, normal work of breathing Heart- Regular rate and rhythm (paced) GI- soft, NT, ND, + BS Extremities- no clubbing, cyanosis, sp bil BKA, femoral sheath with temp wire are in place MS- no significant deformity or atrophy Skin- no rash or lesion Psych- euthymic mood, full affect Neuro- strength and sensation are intact  EKG today reveals complete heart block with V pacing Echo 04/19/14 reveals EF 40-45%, moderate MR, moderate TR, PA pressure 40 mm Hg  Assessment and Plan:  1. Complete heart block The patient has symptomatic persistent complete heart block for which he has required temporary pacing urgently.   He remains pacemaker dependant despite coreg washout.   I would therefore recommend pacemaker implantation at this time.  Given EF < 50%, he may also be expected to benefit from CRT.  There is no indication at this time however for ICD.  Risks, benefits, alternatives to biventricular pacemaker implantation were discussed in detail with the patient today. The patient understands that the risks include but are not limited to bleeding, infection, pneumothorax, perforation, tamponade, vascular damage, renal failure, MI, stroke, death,  and  lead dislodgement and wishes to proceed. We will therefore schedule the procedure at the next available time.  I have tentatively placed on the schedule for tomorrow as our schedule allows.  2. CAD Stable No change required today  3. Ischemic CM EF 40% Hold coreg for now This can be restarted s/p pacemaker implantation   Hillis Range MD 05/15/2014 12:45 PM

## 2014-05-16 ENCOUNTER — Encounter (HOSPITAL_COMMUNITY)
Admission: EM | Disposition: A | Discharge status: Home Health | Payer: Self-pay | Source: Home / Self Care | Attending: Cardiology

## 2014-05-16 DIAGNOSIS — I5022 Chronic systolic (congestive) heart failure: Secondary | ICD-10-CM

## 2014-05-16 DIAGNOSIS — I442 Atrioventricular block, complete: Secondary | ICD-10-CM

## 2014-05-16 HISTORY — PX: BI-VENTRICULAR PACEMAKER INSERTION: SHX5462

## 2014-05-16 LAB — GLUCOSE, CAPILLARY
GLUCOSE-CAPILLARY: 204 mg/dL — AB (ref 70–99)
GLUCOSE-CAPILLARY: 220 mg/dL — AB (ref 70–99)
Glucose-Capillary: 164 mg/dL — ABNORMAL HIGH (ref 70–99)
Glucose-Capillary: 209 mg/dL — ABNORMAL HIGH (ref 70–99)
Glucose-Capillary: 146 mg/dL — ABNORMAL HIGH (ref 70–99)

## 2014-05-16 SURGERY — BI-VENTRICULAR PACEMAKER INSERTION (CRT-P)
Anesthesia: LOCAL

## 2014-05-16 MED ORDER — HYDROCODONE-ACETAMINOPHEN 5-325 MG PO TABS
1.0000 | ORAL_TABLET | Freq: Once | ORAL | Status: AC
Start: 2014-05-16 — End: 2014-05-16
  Administered 2014-05-16: 1 via ORAL
  Filled 2014-05-16: qty 1

## 2014-05-16 MED ORDER — FENTANYL CITRATE 0.05 MG/ML IJ SOLN
INTRAMUSCULAR | Status: AC
  Filled 2014-05-16: qty 2

## 2014-05-16 MED ORDER — LIDOCAINE HCL (PF) 1 % IJ SOLN
INTRAMUSCULAR | Status: AC
  Filled 2014-05-16: qty 30

## 2014-05-16 MED ORDER — MIDAZOLAM HCL 5 MG/5ML IJ SOLN
INTRAMUSCULAR | Status: AC
  Filled 2014-05-16: qty 5

## 2014-05-16 MED ORDER — CEFAZOLIN SODIUM 1-5 GM-% IV SOLN
1.0000 g | Freq: Four times a day (QID) | INTRAVENOUS | Status: AC
  Administered 2014-05-16 – 2014-05-17 (×3): 1 g via INTRAVENOUS
  Filled 2014-05-16 (×3): qty 50

## 2014-05-16 MED ORDER — ONDANSETRON HCL 4 MG/2ML IJ SOLN: 4.0000 mg | Freq: Four times a day (QID) | INTRAMUSCULAR | Status: DC | PRN

## 2014-05-16 MED ORDER — HEPARIN (PORCINE) IN NACL 2-0.9 UNIT/ML-% IJ SOLN
INTRAMUSCULAR | Status: AC
  Filled 2014-05-16: qty 500

## 2014-05-16 MED ORDER — ACETAMINOPHEN 325 MG PO TABS
325.0000 mg | ORAL_TABLET | ORAL | Status: DC | PRN
  Administered 2014-05-17: 650 mg via ORAL
  Filled 2014-05-16: qty 2

## 2014-05-16 NOTE — Progress Notes (Signed)
Inpatient Diabetes Program Recommendations  AACE/ADA: New Consensus Statement on Inpatient Glycemic Control (2013)  Target Ranges:  Prepandial:   less than 140 mg/dL      Peak postprandial:   less than 180 mg/dL (1-2 hours)      Critically ill patients:  140 - 180 mg/dL   Inpatient Diabetes Program Recommendations Insulin - Basal: add Lantus 15 units at HS Thank you  Piedad Climes BSN, RN,CDE Inpatient Diabetes Coordinator [phone] (team pager)

## 2014-05-16 NOTE — Interval H&P Note (Signed)
History and Physical Interval Note:  05/16/2014 12:02 PM  Christopher Padilla  has presented today for surgery, with the diagnosis of bradycardia  The various methods of treatment have been discussed with the patient and family. After consideration of risks, benefits and other options for treatment, the patient has consented to  Procedure(s): BI-VENTRICULAR PACEMAKER INSERTION (CRT-P) (N/A) as a surgical intervention .  The patient's history has been reviewed, patient examined, no change in status, stable for surgery.  I have reviewed the patient's chart and labs.  Questions were answered to the patient's satisfaction.     Lewayne Bunting

## 2014-05-16 NOTE — Progress Notes (Signed)
Site area: rt fem vein Site Prior to Removal:  Level 0 Pressure Applied For: 10 min Manual:   yes Patient Status During Pull:  stable Post Pull Site:  Level 0 Post Pull Instructions Given:  Post procedure instructions given to patient Post Pull Pulses Present: yes Dressing Applied:  tegaderm Bedrest begins @ 1445 Comments:

## 2014-05-16 NOTE — CV Procedure (Signed)
SURGEON: Lewayne Bunting, MD   PREPROCEDURE DIAGNOSIS: Symptomatic complete AV block, ischemic CM (EF 40%), NYHA Class II CHF   POSTPROCEDURE DIAGNOSIS: Symptomatic complete AV block, ischemic CM (EF 40%), NYHA Class II CHF   PROCEDURES:  1. Coronary sinus venography.  2. Biventricular Pacemaker implantation.   INTRODUCTION: Christopher Padilla is a 74 y.o. male with a history of symptomatic complete AV block with EF 40% who presents today for pacemaker implantation. The patient reports intermittent episodes of dizziness and one episode of syncope over the past few months. No reversible causes have been identified. He is expected to V paced > 40% and has an EF of 40%. The patient therefore presents today for biventricular pacemaker implantation.   DESCRIPTION OF PROCEDURE: Informed written consent was obtained, and the patient was brought to the electrophysiology lab in a fasting state. The patient required no sedation for the procedure today. The patients left chest was prepped and draped in the usual sterile fashion by the EP lab staff. The skin overlying the left deltopectoral region was infiltrated with lidocaine for local analgesia. A 5-cm incision was made over the left deltopectoral region. A left subcutaneous pacemaker pocket was fashioned using a combination of sharp and blunt dissection. Electrocautery was required to assure hemostasis.    RA/RV Lead Placement:  The left axillary vein was cannulated. Through the left axillary vein, a [employer] model [id number] (serial number [id number]) right atrial lead and a [facility] model [id number] (serial number [id number]) right ventricular lead were advanced with fluoroscopic visualization into the right atrial appendage and right ventricular apical septal positions respectively. Initial atrial lead P- waves measured 3.2 mV with impedance of 390 ohms and a threshold of 1.2 V at 0.5 msec. Right ventricular lead R-waves measured 33 mV with an impedance  of 560 ohms and a threshold of 1.2 V at 0.5 msec. Both leads were secured to the pectoralis fascia using #2-0 silk over the suture sleeves.   LV Lead Placement:  A St. Jude guide was advanced through the left axillary vein into the low lateral right atrium. A 6 french hexapolar EP catheter was introduced through the [place]. Jude guide and used to cannulate the coronary sinus. A coronary sinus nonselective venogram was performed by hand injection of nonionic contrast. This demonstrated a small lateral vein which was tortuous. There was a middle cardiac vein and a very small posterolateral vein.  A 0.014 angioplasty guide wire was introduced through the St. Jude Guide and advanced into the lateral vein. A Medtronic 4196(serial number [id number]) lead was advanced through the lateral vein into the distal lateral branch. This was approximately one-thirds from the base to the apex in a very lateral  position. In this location with bipolar configuration, the left ventricular lead R-waves measured 18 mV with impedance of 668 ohms and a threshold of 2 volt at 0.5 Milliseconds with no diaphragmatic stimulation observed when pacing at 10 volts output. The St. Jude guide was therefore removed.  All three leads were secured to the pectoralis fascia using #2 silk suture over the suture sleeves. The pocket then irrigated with copious gentamicin solution.   Device Placement:  The leads were then connected to the [facility]. Jude BiV PM (serial number [id number]) pacemaker. The pacemaker was then placed into the pocket. The pocket was then closed in 2 layers with 2.0 Vicryl suture for the subcutaneous and subcuticular layers. Steri- strips and a sterile dressing were then applied.  There were no  early apparent complications. At the end of the procedure, the LV lead was noted to have retracted back to a position 1/3 from base to apex  CONCLUSIONS:  1. Successful implantation of a St. Jude BiV pacemaker for symptomatic complete AV block  in the setting of an ischemic CM, EF 40%. 2. No early apparent complications.   Lewayne Bunting, MD  2:17 PM 05/16/2014

## 2014-05-16 NOTE — Progress Notes (Signed)
Patient has a history of MRSA via PCR.  Positive on 04/20/14.  Needs contact orange precautions.  May re-screen via MRSA PCR on 05-20-14 (30 days per policy). Clarification discussed with Rodney,RN - cath lab and Kim,RN - 2H today at 1545.  CSpeagle,RN, Infection Prevention Specialist 05/16/14 @1550 .

## 2014-05-16 NOTE — Care Management Note (Addendum)
    Page 1 of 2   05/17/2014     3:43:07 PM CARE MANAGEMENT NOTE 05/17/2014  Patient:  Christopher Padilla, Christopher Padilla   Account Number:  [id number]  Date Initiated:  05/16/2014  Documentation initiated by:  Junius Creamer  Subjective/Objective Assessment:   adm w complete heart block     Action/Plan:   lives w wife, pcp dr Harvie Bridge [place]   Anticipated DC Date:  05/17/2014   Anticipated DC Plan:  HOME W HOME HEALTH SERVICES      DC Planning Services  CM consult      [facility] Choice  HOME HEALTH  DURABLE MEDICAL EQUIPMENT   Choice offered to / List presented to:  [name]   DME arranged  Levan Hurst      DME agency  Christopher Padilla     HH arranged  HH-2 PT      [facility] agency  [facility]   Status of service:  Completed, signed off Medicare Important Message given?  YES (If response is "NO", the following Medicare IM given date fields will be blank) Date Medicare IM given:  05/17/2014 Medicare IM given by:  Christopher Padilla Date Additional Medicare IM given:   Additional Medicare IM given by:    Discharge Disposition:  HOME W HOME HEALTH SERVICES  Per UR Regulation:  Reviewed for med. necessity/level of care/duration of stay  If discussed at Long Length of Stay Meetings, dates discussed:    Comments:  05/17/14-  1430- Christopher Pierini RN, BSN [phone] Pt for d/c home today, orders for RW with seat and HH-PT- have already spoken with pt at bedside- pt agreeable to [facility] services- would like to use [facility] - referral called to Lupita Leash with [facility]- for HH-PT-  As pt has [employer]- contracted with certain DME agencies- per pt he would like referral for rollator sent to [employer]- and dme delivered to home- call made to Christopher Allegra [phone])  and order for rollator faxed via epic. [phone]).

## 2014-05-16 NOTE — Progress Notes (Signed)
IV rt ac saline locked. Left arm placed in sling. Instructions given. Waiting on bed assignment. Sleeping.

## 2014-05-16 NOTE — H&P (View-Only) (Signed)
Patient Name: Christopher Padilla Date of Encounter: 05/16/2014  Active Problems:   Complete heart block   Cardiomyopathy, ischemic   Coronary artery disease involving native coronary artery of native heart without angina pectoris   Primary Cardiologist: Dr. Antoine Poche   Patient Profile: 74 yo male w/ hx ICM (EF 40-45% by echo 04/2014), CABG 2003, cath 01/05 w/ 3/4 patent grafts, bilateral BKA, bilateral carotid dz, hypothyroid, DM, HLD, PVD, CKD, admitted 01/09 with CHB.  SUBJECTIVE: Feels well, tolerating temp pacer well, no chest pain or SOB. Feels breathing is at baseline. Admits to a poor diet, says due to work schedule.   OBJECTIVE Filed Vitals:   05/16/14 0200 05/16/14 0300 05/16/14 0400 05/16/14 0500  BP: 123/94 144/62 149/59   Pulse: 138 79 79   Temp:      TempSrc:      Resp: 20 18 24    Height:      Weight:    146 lb 6.2 oz (66.4 kg)  SpO2: 99% 97% 99%     Intake/Output Summary (Last 24 hours) at 05/16/14 0731 Last data filed at 05/16/14 0400  Gross per 24 hour  Intake    750 ml  Output   1651 ml  Net   -901 ml   Filed Weights   05/14/14 1415 05/15/14 0256 05/16/14 0500  Weight: 147 lb 14.9 oz (67.1 kg) 150 lb 9.2 oz (68.3 kg) 146 lb 6.2 oz (66.4 kg)    PHYSICAL EXAM General: Well developed, well nourished, male in no acute distress. Head: Normocephalic, atraumatic.  Neck: Supple without bruits, JVD minimal elevated. Lungs:  Resp regular and unlabored, few rales bases. Heart: RRR, S1, S2, no S3, S4, 2/6 murmur; no rub. Abdomen: Soft, non-tender, non-distended, BS + x 4.  Extremities: s/p bilateral BKA, decreased pulses bilateral radials Neuro: Alert and oriented X 3. Moves all extremities spontaneously. Psych: Normal affect.  LABS: CBC: Recent Labs  05/14/14 1545 05/15/14 0315  WBC 7.0 9.0  HGB 13.0 12.6*  HCT 38.5* 38.4*  MCV 87.7 88.7  PLT 151 144*   INR: Recent Labs  05/15/14 0315  INR 1.17   Basic Metabolic Panel: Recent Labs  05/14/14 1205 05/14/14 1215 05/14/14 1545 05/15/14 0315  NA 134* 135  --  134*  K 5.2* 5.3*  --  5.2*  CL 103 102  --  102  CO2 22  --   --  26  GLUCOSE 238* 233*  --  231*  BUN 35* 37*  --  28*  CREATININE 2.70* 2.40* 2.36* 1.89*  CALCIUM 9.0  --   --  8.7   Liver Function Tests: Recent Labs  05/14/14 1205 05/15/14 0315  AST 119* 91*  ALT 113* 118*  ALKPHOS 169* 161*  BILITOT 1.0 0.8  PROT 5.6* 5.8*  ALBUMIN 2.9* 3.0*   Cardiac Enzymes: Recent Labs  05/14/14 1545 05/14/14 2048 05/15/14 0315  TROPONINI 0.09* 0.08* 0.09*    Recent Labs  05/14/14 1036 05/14/14 1212  TROPIPOC 0.02 0.04   Hemoglobin A1C: Recent Labs  05/14/14 1545  HGBA1C 10.3*   Fasting Lipid Panel: Recent Labs  05/15/14 0315  CHOL 91  HDL 38*  LDLCALC 40  TRIG 64  CHOLHDL 2.4   Thyroid Function Tests: Recent Labs  05/14/14 1545  TSH 0.317*    TELE:  V pacing, 3:1 2nd degree and CHB when rate decreased.       Radiology/Studies: Dg Chest Portable 1 View 05/14/2014   CLINICAL DATA:  Chest pressure. History of CAD, diabetes and hyperlipidemia  EXAM: PORTABLE CHEST - 1 VIEW  COMPARISON:  04/18/2014; 08/30/2013; 06/21/2005; chest CT - 04/19/2014  FINDINGS: Grossly unchanged borderline enlarged cardiac silhouette and mediastinal contours post median sternotomy and CABG. External pacer devices overlie the cardiac apex. Mild pulmonary venous congestion without frank evidence of edema. There is chronic blunting of the left costophrenic angle without definite pleural effusion. No new focal airspace opacities. No pneumothorax. Unchanged bones.  IMPRESSION: Pulmonary venous congestion without definite acute cardiopulmonary disease on this AP portable examination.   Electronically Signed   By: Simonne Come M.D.   On: 05/14/2014 11:22     Current Medications:  . aspirin EC  81 mg Oral Daily  .  ceFAZolin (ANCEF) IV  2 g Intravenous On Call  . gabapentin  600 mg Oral BID  . gentamicin irrigation   80 mg Irrigation On Call  . heparin  5,000 Units Subcutaneous 3 times per day  . insulin aspart  0-15 Units Subcutaneous TID WC  . insulin aspart  0-5 Units Subcutaneous QHS  . levothyroxine  125 mcg Oral QAC breakfast  . nortriptyline  25 mg Oral QHS  . pantoprazole  40 mg Oral Daily  . simvastatin  40 mg Oral QHS  . sodium chloride  3 mL Intravenous Q12H   . sodium chloride 10 mL/hr at 05/16/14 0637  . sodium chloride 10 mL/hr (05/15/14 0100)  . sodium chloride 50 mL/hr at 05/16/14 1610    ASSESSMENT AND PLAN: Active Problems:   Complete heart block - temp pacer functioning well, for PPM this pm, orders written.    Cardiomyopathy, ischemic - weight trending down, I/O negative, no sig volume overload by exam, follow    Coronary artery disease involving native coronary artery of native heart without angina pectoris - stable, on ASA, no BB with CHB, on statin, no ACE/ARB with CKD.    CKD 3 - Follow   Abnl LFTs - improving, will hold statin.    Hypothyroid - TSH is low, on home dose Synthroid, MD advise on decrease.    DM - A1c is elevated, pt eats poorly, will have nutritionist to see  Signed, Theodore Demark , PA-C 7:31 AM 05/16/2014  EP Attending  Patient seen and examined. Agree with above. The patient has persistent complete heart block and is using a temporary pm over 72 hours after stopping coreg. I have discussed the risks/benefits/goals/expectations of BiV PPM insertion and he wishes to proceed.  Leonia Reeves.D.

## 2014-05-16 NOTE — Progress Notes (Signed)
   Patient Name: Christopher Padilla Date of Encounter: 05/16/2014  Active Problems:   Complete heart block   Cardiomyopathy, ischemic   Coronary artery disease involving native coronary artery of native heart without angina pectoris   Primary Cardiologist: Dr. Hochrein   Patient Profile: 74 yo male w/ hx ICM (EF 40-45% by echo 04/2014), CABG 2003, cath 01/05 w/ 3/4 patent grafts, bilateral BKA, bilateral carotid dz, hypothyroid, DM, HLD, PVD, CKD, admitted 01/09 with CHB.  SUBJECTIVE: Feels well, tolerating temp pacer well, no chest pain or SOB. Feels breathing is at baseline. Admits to a poor diet, says due to work schedule.   OBJECTIVE Filed Vitals:   05/16/14 0200 05/16/14 0300 05/16/14 0400 05/16/14 0500  BP: 123/94 144/62 149/59   Pulse: 138 79 79   Temp:      TempSrc:      Resp: 20 18 24   Height:      Weight:    146 lb 6.2 oz (66.4 kg)  SpO2: 99% 97% 99%     Intake/Output Summary (Last 24 hours) at 05/16/14 0731 Last data filed at 05/16/14 0400  Gross per 24 hour  Intake    750 ml  Output   1651 ml  Net   -901 ml   Filed Weights   05/14/14 1415 05/15/14 0256 05/16/14 0500  Weight: 147 lb 14.9 oz (67.1 kg) 150 lb 9.2 oz (68.3 kg) 146 lb 6.2 oz (66.4 kg)    PHYSICAL EXAM General: Well developed, well nourished, male in no acute distress. Head: Normocephalic, atraumatic.  Neck: Supple without bruits, JVD minimal elevated. Lungs:  Resp regular and unlabored, few rales bases. Heart: RRR, S1, S2, no S3, S4, 2/6 murmur; no rub. Abdomen: Soft, non-tender, non-distended, BS + x 4.  Extremities: s/p bilateral BKA, decreased pulses bilateral radials Neuro: Alert and oriented X 3. Moves all extremities spontaneously. Psych: Normal affect.  LABS: CBC: Recent Labs  05/14/14 1545 05/15/14 0315  WBC 7.0 9.0  HGB 13.0 12.6*  HCT 38.5* 38.4*  MCV 87.7 88.7  PLT 151 144*   INR: Recent Labs  05/15/14 0315  INR 1.17   Basic Metabolic Panel: Recent Labs  05/14/14 1205 05/14/14 1215 05/14/14 1545 05/15/14 0315  NA 134* 135  --  134*  K 5.2* 5.3*  --  5.2*  CL 103 102  --  102  CO2 22  --   --  26  GLUCOSE 238* 233*  --  231*  BUN 35* 37*  --  28*  CREATININE 2.70* 2.40* 2.36* 1.89*  CALCIUM 9.0  --   --  8.7   Liver Function Tests: Recent Labs  05/14/14 1205 05/15/14 0315  AST 119* 91*  ALT 113* 118*  ALKPHOS 169* 161*  BILITOT 1.0 0.8  PROT 5.6* 5.8*  ALBUMIN 2.9* 3.0*   Cardiac Enzymes: Recent Labs  05/14/14 1545 05/14/14 2048 05/15/14 0315  TROPONINI 0.09* 0.08* 0.09*    Recent Labs  05/14/14 1036 05/14/14 1212  TROPIPOC 0.02 0.04   Hemoglobin A1C: Recent Labs  05/14/14 1545  HGBA1C 10.3*   Fasting Lipid Panel: Recent Labs  05/15/14 0315  CHOL 91  HDL 38*  LDLCALC 40  TRIG 64  CHOLHDL 2.4   Thyroid Function Tests: Recent Labs  05/14/14 1545  TSH 0.317*    TELE:  V pacing, 3:1 2nd degree and CHB when rate decreased.       Radiology/Studies: Dg Chest Portable 1 View 05/14/2014   CLINICAL DATA:    Chest pressure. History of CAD, diabetes and hyperlipidemia  EXAM: PORTABLE CHEST - 1 VIEW  COMPARISON:  04/18/2014; 08/30/2013; 06/21/2005; chest CT - 04/19/2014  FINDINGS: Grossly unchanged borderline enlarged cardiac silhouette and mediastinal contours post median sternotomy and CABG. External pacer devices overlie the cardiac apex. Mild pulmonary venous congestion without frank evidence of edema. There is chronic blunting of the left costophrenic angle without definite pleural effusion. No new focal airspace opacities. No pneumothorax. Unchanged bones.  IMPRESSION: Pulmonary venous congestion without definite acute cardiopulmonary disease on this AP portable examination.   Electronically Signed   By: John  Watts M.D.   On: 05/14/2014 11:22     Current Medications:  . aspirin EC  81 mg Oral Daily  .  ceFAZolin (ANCEF) IV  2 g Intravenous On Call  . gabapentin  600 mg Oral BID  . gentamicin irrigation   80 mg Irrigation On Call  . heparin  5,000 Units Subcutaneous 3 times per day  . insulin aspart  0-15 Units Subcutaneous TID WC  . insulin aspart  0-5 Units Subcutaneous QHS  . levothyroxine  125 mcg Oral QAC breakfast  . nortriptyline  25 mg Oral QHS  . pantoprazole  40 mg Oral Daily  . simvastatin  40 mg Oral QHS  . sodium chloride  3 mL Intravenous Q12H   . sodium chloride 10 mL/hr at 05/16/14 0637  . sodium chloride 10 mL/hr (05/15/14 0100)  . sodium chloride 50 mL/hr at 05/16/14 0637    ASSESSMENT AND PLAN: Active Problems:   Complete heart block - temp pacer functioning well, for PPM this pm, orders written.    Cardiomyopathy, ischemic - weight trending down, I/O negative, no sig volume overload by exam, follow    Coronary artery disease involving native coronary artery of native heart without angina pectoris - stable, on ASA, no BB with CHB, on statin, no ACE/ARB with CKD.    CKD 3 - Follow   Abnl LFTs - improving, will hold statin.    Hypothyroid - TSH is low, on home dose Synthroid, MD advise on decrease.    DM - A1c is elevated, pt eats poorly, will have nutritionist to see  Signed, Rhonda Barrett , PA-C 7:31 AM 05/16/2014  EP Attending  Patient seen and examined. Agree with above. The patient has persistent complete heart block and is using a temporary pm over 72 hours after stopping coreg. I have discussed the risks/benefits/goals/expectations of BiV PPM insertion and he wishes to proceed.  Khady Vandenberg,M.D. 

## 2014-05-17 ENCOUNTER — Encounter (HOSPITAL_COMMUNITY): Payer: Self-pay | Admitting: Internal Medicine

## 2014-05-17 ENCOUNTER — Inpatient Hospital Stay (HOSPITAL_COMMUNITY): Payer: Medicare HMO

## 2014-05-17 DIAGNOSIS — N183 Chronic kidney disease, stage 3 (moderate): Secondary | ICD-10-CM

## 2014-05-17 DIAGNOSIS — E1122 Type 2 diabetes mellitus with diabetic chronic kidney disease: Secondary | ICD-10-CM | POA: Diagnosis present

## 2014-05-17 LAB — COMPREHENSIVE METABOLIC PANEL
ALT: 76 U/L — ABNORMAL HIGH (ref 0–53)
AST: 38 U/L — AB (ref 0–37)
Albumin: 2.7 g/dL — ABNORMAL LOW (ref 3.5–5.2)
Alkaline Phosphatase: 136 U/L — ABNORMAL HIGH (ref 39–117)
Anion gap: 8 (ref 5–15)
BILIRUBIN TOTAL: 1.1 mg/dL (ref 0.3–1.2)
BUN: 15 mg/dL (ref 6–23)
CO2: 25 mmol/L (ref 19–32)
CREATININE: 1.25 mg/dL (ref 0.50–1.35)
Calcium: 8.6 mg/dL (ref 8.4–10.5)
Chloride: 102 mEq/L (ref 96–112)
GFR, EST AFRICAN AMERICAN: 63 mL/min — AB (ref >90)
GFR, EST NON AFRICAN AMERICAN: 55 mL/min — AB (ref >90)
Glucose, Bld: 264 mg/dL — ABNORMAL HIGH (ref 70–99)
Potassium: 5 mmol/L (ref 3.5–5.1)
SODIUM: 135 mmol/L (ref 135–145)
Total Protein: 5.7 g/dL — ABNORMAL LOW (ref 6.0–8.3)

## 2014-05-17 LAB — GLUCOSE, CAPILLARY
GLUCOSE-CAPILLARY: 223 mg/dL — AB (ref 70–99)
Glucose-Capillary: 180 mg/dL — ABNORMAL HIGH (ref 70–99)

## 2014-05-17 MED ORDER — HYDROCODONE-ACETAMINOPHEN 5-325 MG PO TABS
1.0000 | ORAL_TABLET | ORAL | Status: DC | PRN
  Administered 2014-05-17: 1 via ORAL
  Filled 2014-05-17: qty 1

## 2014-05-17 MED ORDER — HYDROCODONE-ACETAMINOPHEN 5-325 MG PO TABS: 1.0000 | ORAL_TABLET | ORAL | Status: DC | PRN

## 2014-05-17 MED ORDER — CARVEDILOL 3.125 MG PO TABS
3.1250 mg | ORAL_TABLET | Freq: Two times a day (BID) | ORAL | Status: DC
  Filled 2014-05-17 (×2): qty 1

## 2014-05-17 NOTE — Evaluation (Signed)
Physical Therapy Evaluation Patient Details Name: Christopher Padilla MRN: [id number] DOB: 12-14-1940 Today's Date: 05/17/2014   History of Present Illness  pt presents post pacer placement.  pt with hx of Bil BKAs.    Clinical Impression  Pt ed and discussion about movements allowed with L UE and pt's ability to don/doff his prosthetics LEs.  Feel pt is safe for D/C to home with his wife, but would benefit from HHPT and RW at D/C.  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  Will continue to follow if remains on acute.      Follow Up Recommendations Home health PT;Supervision for mobility/OOB    Equipment Recommendations  Rolling walker with 5" wheels    Recommendations for Other Services       Precautions / Restrictions Precautions Precautions: Fall Precaution Comments: Bil LE prosthetics.   Restrictions Weight Bearing Restrictions: No      Mobility  Bed Mobility Overal bed mobility: Needs Assistance Bed Mobility: Supine to Sit     Supine to sit: Min assist     General bed mobility comments: pt able to bring LEs towards EOB, but needs A to pull on PT's hand and bring trunk up to sitting.  pt states his wife does this at home.    Transfers Overall transfer level: Needs assistance Equipment used: Rolling walker (2 wheeled) Transfers: Sit to/from Stand Sit to Stand: Min assist         General transfer comment: cues for UE use and MinA to help power up as pt needs to come to standing in order for his prosthetics to lock.    Ambulation/Gait Ambulation/Gait assistance: Min guard Ambulation Distance (Feet): 100 Feet Assistive device: Rolling walker (2 wheeled) Gait Pattern/deviations: Step-through pattern;Decreased stride length;Trunk flexed     General Gait Details: cues for safe positioning within RW.  pt indicates overall feeling weak and fatigues more easily than normal.    Stairs             Wheelchair Mobility    Modified Rankin (Stroke Patients Only)       Balance Overall balance assessment: Needs assistance Sitting-balance support: Single extremity supported;Feet unsupported Sitting balance-Leahy Scale: Poor Sitting balance - Comments: pt needs at least single UE support to maintain balance until he has Bil prothetics on.  Once prosthetics on and feet supported pt able to maintain balance without UE support.     Standing balance support: Bilateral upper extremity supported Standing balance-Leahy Scale: Poor                               Pertinent Vitals/Pain Pain Assessment: 0-10 Pain Score: 3  Pain Location: L shoulder and L side of chest.   Pain Descriptors / Indicators: Sore Pain Intervention(s): Monitored during session;Premedicated before session;Repositioned    Home Living Family/patient expects to be discharged to:: Private residence Living Arrangements: Spouse/significant other Available Help at Discharge: Family;Available 24 hours/day Type of Home: House Home Access: Level entry     Home Layout: One level Home Equipment: Walker - standard;Cane - single point;Wheelchair - manual      Prior Function Level of Independence: Independent with assistive device(s)         Comments: pt indicates only using a cane when outside.       Hand Dominance  Extremity/Trunk Assessment   Upper Extremity Assessment: Overall WFL for tasks assessed           Lower Extremity Assessment: Generalized weakness (hx of Bil BKAs.  )      Cervical / Trunk Assessment: Normal  Communication   Communication: No difficulties  Cognition Arousal/Alertness: Awake/alert Behavior During Therapy: WFL for tasks assessed/performed Overall Cognitive Status: Within Functional Limits for tasks assessed                      General Comments      Exercises        Assessment/Plan    PT Assessment Patient needs continued PT  services  PT Diagnosis Difficulty walking;Generalized weakness   PT Problem List Decreased strength;Decreased activity tolerance;Decreased balance;Decreased mobility;Decreased knowledge of use of DME;Pain  PT Treatment Interventions DME instruction;Gait training;Functional mobility training;Therapeutic activities;Therapeutic exercise;Balance training;Patient/family education   PT Goals (Current goals can be found in the Care Plan section) Acute Rehab PT Goals Patient Stated Goal: Back to work PT Goal Formulation: With patient Time For Goal Achievement: 05/24/14 Potential to Achieve Goals: Good    Frequency Min 3X/week   Barriers to discharge        Co-evaluation               End of Session Equipment Utilized During Treatment: Gait belt Activity Tolerance: Patient tolerated treatment well Patient left: in chair;with call bell/phone within reach Nurse Communication: Mobility status         Time: 1497-0263 PT Time Calculation (min) (ACUTE ONLY): 34 min   Charges:   PT Evaluation $Initial PT Evaluation Tier I: 1 Procedure PT Treatments $Gait Training: 8-22 mins $Therapeutic Activity: 8-22 mins   PT G CodesSunny Schlein, [place] [phone] 05/17/2014, 12:02 PM

## 2014-05-17 NOTE — Plan of Care (Signed)
Problem: Food- and Nutrition-Related Knowledge Deficit (NB-1.1) Goal: Nutrition education Formal process to instruct or train a patient/client in a skill or to impart knowledge to help patients/clients voluntarily manage or modify food choices and eating behavior to maintain or improve health. Outcome: Completed/Met Date Met:  05/17/14  RD consulted for nutrition education regarding diabetes.   Pt has had DM x 35 years and currently takes insulin daily. Per pt he knows the DM diet but due to his work schedule has not always followed diet guidelines. 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 Wife continues to work as well and both feel that it's hard to put a meal on the table when they get home from work.  Pt with hx of bilateral BKA's.     Lab Results  Component Value Date    HGBA1C 10.3* 05/14/2014    RD provided "Carbohydrate Counting for People with Diabetes" handout from the Academy of [employer]etics. Discussed different food groups and their effects on blood sugar, emphasizing carbohydrate-containing foods. Provided list of carbohydrates and recommended serving sizes of common foods.  Discussed importance of controlled and consistent carbohydrate intake throughout the day. Provided examples of ways to balance meals/snacks and encouraged intake of high-fiber, whole grain complex carbohydrates. Teach back method used.  Expect fair compliance.  Current diet order is CHO Modified/Heart Healthy, patient is consuming approximately 100% of meals at this time. Labs and medications reviewed. No further nutrition interventions warranted at this time. RD contact information provided. If additional nutrition issues arise, please re-consult RD.  [place], [place], [place] Pager [phone] After Hours Pager

## 2014-05-17 NOTE — Progress Notes (Signed)
Medicare Important Message given? YES  (If response is "NO", the following Medicare IM given date fields will be blank)  Date Medicare IM given: 05/17/14 Medicare IM given by:  Martha Soltys  

## 2014-05-17 NOTE — Progress Notes (Signed)
Patient Name: Christopher Padilla Date of Encounter: 05/17/2014  Principal Problem:   Complete heart block Active Problems:   Hypothyroidism   Hx of BKA, bilateral   Diabetes mellitus type 2, uncontrolled   Cardiomyopathy, ischemic   Coronary artery disease involving native coronary artery of native heart without angina pectoris   CKD stage 3 due to type 2 diabetes mellitus   Primary Cardiologist: Dr. Antoine Poche  Patient Profile: 74 yo male w/ hx ICM (EF 40-45% by echo 04/2014), CABG 2003, cath 01/05 w/ 3/4 patent grafts, bilateral BKA, bilateral carotid dz, hypothyroid, DM, HLD, PVD, CKD, admitted 01/09 with CHB.  SUBJECTIVE: Feels well today. Admits that he will have problems getting his prosthetic legs on unless he has full use of both arms. Wife not able to do it.   OBJECTIVE Filed Vitals:   05/16/14 1600 05/16/14 1632 05/16/14 2049 05/17/14 0622  BP: 148/60 147/58 145/60 154/66  Pulse: 81 81 82 77  Temp:   98.7 F (37.1 C) 97.7 F (36.5 C)  TempSrc:   Oral Oral  Resp: Height:      Weight:      SpO2: 96% 95% 96% 96%    Intake/Output Summary (Last 24 hours) at 05/17/14 0828 Last data filed at 05/17/14 0751  Gross per 24 hour  Intake   1103 ml  Output   1450 ml  Net   -347 ml   Filed Weights   05/14/14 1415 05/15/14 0256 05/16/14 0500  Weight: 147 lb 14.9 oz (67.1 kg) 150 lb 9.2 oz (68.3 kg) 146 lb 6.2 oz (66.4 kg)    PHYSICAL EXAM General: Well developed, well nourished, male in no acute distress. Head: Normocephalic, atraumatic.  Neck: Supple without bruits, JVD not elevated. Lungs:  Resp regular and unlabored, few rales bases. Heart: RRR, S1, S2, no S3, S4, 2/6 murmur; no rub. Abdomen: Soft, non-tender, non-distended, BS + x 4.  Extremities: s/p bilateral BKA  Neuro: Alert and oriented X 3. Moves all extremities spontaneously. Psych: Normal affect.  LABS: CBC:  Recent Labs  05/14/14 1545 05/15/14 0315  WBC 7.0 9.0  HGB  13.0 12.6*  HCT 38.5* 38.4*  MCV 87.7 88.7  PLT 151 144*   INR:  Recent Labs  05/15/14 0315  INR 1.17   Basic Metabolic Panel:  Recent Labs  40/98/11 1205 05/14/14 1215 05/14/14 1545 05/15/14 0315  NA 134* 135  --  134*  K 5.2* 5.3*  --  5.2*  CL 103 102  --  102  CO2 22  --   --  26  GLUCOSE 238* 233*  --  231*  BUN 35* 37*  --  28*  CREATININE 2.70* 2.40* 2.36* 1.89*  CALCIUM 9.0  --   --  8.7   Liver Function Tests:  Recent Labs  05/14/14 1205 05/15/14 0315  AST 119* 91*  ALT 113* 118*  ALKPHOS 169* 161*  BILITOT 1.0 0.8  PROT 5.6* 5.8*  ALBUMIN 2.9* 3.0*   Cardiac Enzymes:  Recent Labs  05/14/14 1545 05/14/14 2048 05/15/14 0315  TROPONINI 0.09* 0.08* 0.09*    Recent Labs  05/14/14 1036 05/14/14 1212  TROPIPOC 0.02 0.04   Hemoglobin A1C:  Recent Labs  05/14/14 1545  HGBA1C 10.3*   Fasting Lipid Panel:  Recent Labs  05/15/14 0315  CHOL 91  HDL 38*  LDLCALC 40  TRIG 64  CHOLHDL 2.4   Thyroid Function Tests:  Recent Labs  05/14/14  1545  TSH 0.317*   TELE:  SR, V pacing, occ PVCs and pairs.     Radiology/Studies: Dg Chest 2 View 05/17/2014   CLINICAL DATA:  Sore arm.  Pacemaker.  EXAM: CHEST  2 VIEW  COMPARISON:  05/14/2014.  08/30/2013 .  FINDINGS: Prior CABG. Cardiac pacer with lead tips in right atrium and right ventricle. Stable cardiomegaly. No prominent pulmonary venous congestion. Stable left base pleural parenchymal scarring . No pneumothorax. No acute bony abnormality.  IMPRESSION: 1. Interim placement pacemaker. No complicating features noted. Prior CABG. Cardiomegaly. No pulmonary venous congestion. 2. Stable changes of left base pleural parenchymal scarring.   Electronically Signed   By: Maisie Fus  Register   On: 05/17/2014 08:10   Current Medications:  . aspirin EC  81 mg Oral Daily  . gabapentin  600 mg Oral BID  . heparin  5,000 Units Subcutaneous 3 times per day  . insulin aspart  0-15 Units Subcutaneous TID WC    . insulin aspart  0-5 Units Subcutaneous QHS  . levothyroxine  125 mcg Oral QAC breakfast  . nortriptyline  25 mg Oral QHS  . pantoprazole  40 mg Oral Daily  . sodium chloride  3 mL Intravenous Q12H   . sodium chloride 10 mL/hr (05/15/14 0100)    ASSESSMENT AND PLAN: Principal Problem:   Complete heart block - s/p BiV PPM, CXR OK,   Active Problems:   Hypothyroidism - TSH slightly low, MD advise on further labs or Synthroid dose change    Hx of BKA, bilateral - until has full use of left arm, problems with mobility, PT to see    Diabetes mellitus type 2, uncontrolled - nutrition consult.    Cardiomyopathy, ischemic - PTA was on Coreg 3.125 mg bid, will restart, on ASA, statin. No ACE/ARB due to renal dysfunction.    Coronary artery disease involving native coronary artery of native heart without angina pectoris  CKD stage 3 due to type 2 diabetes mellitus - Cr above baseline on admission, improved. Will recheck with LFTs, also abnl on admission  Plan - d/c when medically stable, possibly today if issues are resolved.   Melida Quitter , PA-C 8:28 AM 05/17/2014

## 2014-05-17 NOTE — Progress Notes (Signed)
IV and tele monitor d/c at this time; pt and wife given d/c instructions; both verbalized understanding; pt to d/c home with wife; will cont. To monitor. 

## 2014-05-17 NOTE — Discharge Summary (Signed)
CARDIOLOGY DISCHARGE SUMMARY   Patient ID: [name] MRN: [id number] DOB/AGE: 08-Mar-1941 74 y.o.  Admit date: 05/14/2014 Discharge date: 05/17/2014  PCP: [name], FNP Primary Cardiologist: Dr. Antoine Poche  Primary Discharge Diagnosis:  Complete heart block - s/p St. Jude BiV PM (serial number [phone])   Secondary Discharge Diagnosis:    Hypothyroidism   Hx of BKA, bilateral   Diabetes mellitus type 2, uncontrolled   Cardiomyopathy, ischemic   Coronary artery disease involving native coronary artery of native heart without angina pectoris   CKD stage 3 due to type 2 diabetes mellitus   Hyperkalemia  Consults: Electrophysiology  PROCEDURES:  1. Coronary sinus venography.  2. Biventricular Pacemaker implantation.  3. Temporary pacemaker insertion under fluoroscopic guidance  Hospital Course: Christopher Padilla is a 74 y.o. male with a history of CAD and ischemic cardiomyopathy. He was admitted on 05/14/2014 from his primary care physician's office for weakness and complete heart block.  He was symptomatic from the complete heart block and was felt to be unstable. A temporary transvenous pacing wire was inserted without complication. He tolerated the procedure well. He was admitted to intensive care. He had been on Coreg prior to admission but this was discontinued.  He was noted to have electrolyte abnormalities including hyperkalemia, renal insufficiency and abnormal LFTs. His troponins were minimally elevated but this was felt secondary to the acute illness and not acute coronary syndrome. His BUN/creatinine are significantly higher than normal at 35/2.7. He was hydrated and both his potassium levels and his renal function improved. His blood sugars were managed with sliding scale insulin. His LFTs gradually improved, discharge labs are below. Because of his abnormal liver functions, his statin was discontinued.  Despite discontinuing the Coreg, his heart rate did  not improve and he required constant pacing. He was seen by Dr. Johney Frame with electrophysiology and it was noted that he remained pacemaker dependent despite Coreg washout. Since his EF is less than 50%, CRT will also be of benefit. Therefore, a biventricular pacemaker was recommended and he agreed.  Mr. Teale had a St. Jude biventricular pacemaker inserted on 05/16/2014 without immediate complication. He tolerated the procedure well.  Because his left arm uses restricted temporarily and he is a bilateral lower extremity amputee, physical therapy consult was called to make sure he was able to manage his needs. He did well with them but home health physical therapy was recommended and he also needs a rolling walker. These were ordered.   On 05/17/2014, he was seen by Dr. Ladona Ridgel and all data were reviewed. He was having no chest pain or shortness of breath. His pacemaker was functioning well and his postprocedure chest x-ray showed no significant abnormalities. No further inpatient workup was indicated and he is considered stable for discharge, to follow-up as an outpatient.  Labs:   Lab Results  Component Value Date   WBC 9.0 05/15/2014   HGB 12.6* 05/15/2014   HCT 38.4* 05/15/2014   MCV 88.7 05/15/2014   PLT 144* 05/15/2014     Recent Labs Lab 05/17/14 0913  NA 135  K 5.0  CL 102  CO2 25  BUN 15  CREATININE 1.25  CALCIUM 8.6  PROT 5.7*  BILITOT 1.1  ALKPHOS 136*  ALT 76*  AST 38*  GLUCOSE 264*    Recent Labs  05/14/14 1545 05/14/14 2048 05/15/14 0315  TROPONINI 0.09* 0.08* 0.09*   Lipid Panel     Component Value Date/Time   CHOL  91 05/15/2014 0315   TRIG 64 05/15/2014 0315   HDL 38* 05/15/2014 0315   CHOLHDL 2.4 05/15/2014 0315   VLDL 13 05/15/2014 0315   LDLCALC 40 05/15/2014 0315    Recent Labs  05/15/14 0315  INR 1.17      Radiology: Dg Chest 2 View 05/17/2014   CLINICAL DATA:  Sore arm.  Pacemaker.  EXAM: CHEST  2 VIEW  COMPARISON:  05/14/2014.   08/30/2013 .  FINDINGS: Prior CABG. Cardiac pacer with lead tips in right atrium and right ventricle. Stable cardiomegaly. No prominent pulmonary venous congestion. Stable left base pleural parenchymal scarring . No pneumothorax. No acute bony abnormality.  IMPRESSION: 1. Interim placement pacemaker. No complicating features noted. Prior CABG. Cardiomegaly. No pulmonary venous congestion. 2. Stable changes of left base pleural parenchymal scarring.   Electronically Signed   By: Maisie Fus  Register   On: 05/17/2014 08:10   Dg Chest Portable 1 View 05/14/2014   CLINICAL DATA:  Chest pressure. History of CAD, diabetes and hyperlipidemia  EXAM: PORTABLE CHEST - 1 VIEW  COMPARISON:  04/18/2014; 08/30/2013; 06/21/2005; chest CT - 04/19/2014  FINDINGS: Grossly unchanged borderline enlarged cardiac silhouette and mediastinal contours post median sternotomy and CABG. External pacer devices overlie the cardiac apex. Mild pulmonary venous congestion without frank evidence of edema. There is chronic blunting of the left costophrenic angle without definite pleural effusion. No new focal airspace opacities. No pneumothorax. Unchanged bones.  IMPRESSION: Pulmonary venous congestion without definite acute cardiopulmonary disease on this AP portable examination.   Electronically Signed   By: Simonne Come M.D.   On: 05/14/2014 11:22   EKG: 05/17/2014 Atrial sensed, V paced, rate 75  FOLLOW UP PLANS AND APPOINTMENTS Allergies  Allergen Reactions  . Codeine     hallucinations  . Ciprofloxacin Diarrhea  . Nsaids Other (See Comments)    GI upset     Medication List    STOP taking these medications        simvastatin 40 MG tablet  Commonly known as:  ZOCOR      TAKE these medications        aspirin 81 MG tablet  Take 81 mg by mouth daily.     BD INSULIN SYRINGE ULTRAFINE 31G X 15/64" 0.5 ML Misc  Generic drug:  Insulin Syringe-Needle U-100  USE AS DIRECTED     carvedilol 3.125 MG tablet  Commonly known as:   COREG  Take 1 tablet (3.125 mg total) by mouth 2 (two) times daily with a meal.     gabapentin 600 MG tablet  Commonly known as:  NEURONTIN  TAKE 1 CAPSULE BY MOUTH THREE TIMES A DAY     HYDROcodone-acetaminophen 5-325 MG per tablet  Commonly known as:  NORCO/VICODIN  Take 1 tablet by mouth every 4 (four) hours as needed for moderate pain.     insulin glargine 100 UNIT/ML injection  Commonly known as:  LANTUS  INJECT 4O UNITS SUBCUTANEOUSLY AT BEDTIME     insulin lispro 100 UNIT/ML injection  Commonly known as:  HUMALOG  INJECT 0-20 UNITS INTO THE SKIN AS DIRECTED. SLIDING SCALE     levothyroxine 125 MCG tablet  Commonly known as:  SYNTHROID, LEVOTHROID  TAKE 1 TABLET BY MOUTH DAILY     MUSCLE RUB 10-15 % Crea  Apply 1 application topically daily as needed for muscle pain.     nortriptyline 25 MG capsule  Commonly known as:  PAMELOR  TAKE 1 CAPSULE BY MOUTH AT BEDTIME  omeprazole 20 MG capsule  Commonly known as:  PRILOSEC  TAKE 1 CAPSULE BY MOUTH DAILY     ONE TOUCH ULTRA TEST test strip  Generic drug:  glucose blood  USE TO TEST SUGAR 4 TIMES DAILY     VITAMIN D PO  Take 1 tablet by mouth 2 (two) times daily.        Discharge Instructions    Diet - low sodium heart healthy    Complete by:  As directed      Diet Carb Modified    Complete by:  As directed      Increase activity slowly    Complete by:  As directed           Follow-up Information    Follow up with [facility].   Why:  HH-PT arranged   Contact information:   [address] [place] [address] [phone]       Follow up with [facility].   Why:  rollator arranged and to be delivered to home- order faxed   Contact information:   [address] [place] [address] [phone]       Follow up with [facility]   Why:  Wound check and device check, the office will call   Contact information:   [address] [place] [address]       Follow up with Hillis Range, MD.   Specialty:  Cardiology   Why:  See in 3 months, the office will call.   Contact information:   [address] [place] [address] [phone]       BRING ALL MEDICATIONS WITH YOU TO FOLLOW UP APPOINTMENTS  Time spent with patient to include physician time: 45 min Signed: Theodore Demark, PA-C 05/17/2014, 3:16 PM Co-Sign MD

## 2014-05-17 NOTE — Discharge Instructions (Signed)
° °  Supplemental Discharge Instructions for  °Pacemaker/Defibrillator Patients ° °Activity °No heavy lifting or vigorous activity with your left/right arm for 6 to 8 weeks.  Do not raise your left/right arm above your head for one week.  Gradually raise your affected arm as drawn below. ° °        ° °            01/15                   01/16                    01/17                    01/18           ° °NO DRIVING for  1 week    ; you may begin driving on     01/19     . °WOUND CARE °- Keep the wound area clean and dry.  Do not get this area wet for one week. No showers for one week; you may shower on      01/19        . °- The tape/steri-strips on your wound will fall off; do not pull them off.  No bandage is needed on the site.  DO  NOT apply any creams, oils, or ointments to the wound area. °- If you notice any drainage or discharge from the wound, any swelling or bruising at the site, or you develop a fever > 101? F after you are discharged home, call the office at once. ° °Special Instructions °- You are still able to use cellular telephones; use the ear opposite the side where you have your pacemaker/defibrillator.  Avoid carrying your cellular phone near your device. °- When traveling through airports, show security personnel your identification card to avoid being screened in the metal detectors.  Ask the security personnel to use the hand wand. °- Avoid arc welding equipment, MRI testing (magnetic resonance imaging), TENS units (transcutaneous nerve stimulators).  Call the office for questions about other devices. °- Avoid electrical appliances that are in poor condition or are not properly grounded. °- Microwave ovens are safe to be near or to operate. ° °Additional information for defibrillator patients should your device go off: °- If your device goes off ONCE and you feel fine afterward, notify the device clinic nurses. °- If your device goes off ONCE and you do not feel well afterward, call 911. °- If  your device goes off TWICE, call 911. °- If your device goes off THREE times in one day, call 911. ° °DO NOT DRIVE YOURSELF OR A FAMILY MEMBER °WITH A DEFIBRILLATOR TO THE HOSPITAL--CALL 911. ° °

## 2014-05-17 NOTE — Progress Notes (Signed)
Patient ID: Christopher Padilla, male   DOB: December 04, 1940, 74 y.o.   MRN: [id number]    Patient Name: Christopher Padilla Date of Encounter: 05/17/2014     Principal Problem:   Complete heart block Active Problems:   Hypothyroidism   Hx of BKA, bilateral   Diabetes mellitus type 2, uncontrolled   Cardiomyopathy, ischemic   Coronary artery disease involving native coronary artery of native heart without angina pectoris   CKD stage 3 due to type 2 diabetes mellitus    SUBJECTIVE  No chest pain or sob.  CURRENT MEDS . aspirin EC  81 mg Oral Daily  . carvedilol  3.125 mg Oral BID WC  . gabapentin  600 mg Oral BID  . heparin  5,000 Units Subcutaneous 3 times per day  . insulin aspart  0-15 Units Subcutaneous TID WC  . insulin aspart  0-5 Units Subcutaneous QHS  . levothyroxine  125 mcg Oral QAC breakfast  . nortriptyline  25 mg Oral QHS  . pantoprazole  40 mg Oral Daily  . sodium chloride  3 mL Intravenous Q12H    OBJECTIVE  Filed Vitals:   05/16/14 1600 05/16/14 1632 05/16/14 2049 05/17/14 0622  BP: 148/60 147/58 145/60 154/66  Pulse: 81 81 82 77  Temp:   98.7 F (37.1 C) 97.7 F (36.5 C)  TempSrc:   Oral Oral  Resp: Height:      Weight:      SpO2: 96% 95% 96% 96%    Intake/Output Summary (Last 24 hours) at 05/17/14 0859 Last data filed at 05/17/14 0751  Gross per 24 hour  Intake   1103 ml  Output   1450 ml  Net   -347 ml   Filed Weights   05/14/14 1415 05/15/14 0256 05/16/14 0500  Weight: 147 lb 14.9 oz (67.1 kg) 150 lb 9.2 oz (68.3 kg) 146 lb 6.2 oz (66.4 kg)    PHYSICAL EXAM  General: Pleasant, 74 yo man, NAD. Neuro: Alert and oriented X 3. Moves all extremities spontaneously. Psych: Normal affect. HEENT:  Normal  Neck: Supple without bruits or JVD. Lungs:  Resp regular and unlabored, CTA. No hematoma Heart: RRR no s3, s4, or murmurs. Abdomen: Soft, non-tender, non-distended, BS + x 4.  Extremities: No clubbing, cyanosis or edema.  DP/PT/Radials 2+ and equal bilaterally.  Accessory Clinical Findings  CBC  Recent Labs  05/14/14 1545 05/15/14 0315  WBC 7.0 9.0  HGB 13.0 12.6*  HCT 38.5* 38.4*  MCV 87.7 88.7  PLT 151 144*   Basic Metabolic Panel  Recent Labs  05/14/14 1205 05/14/14 1215 05/14/14 1545 05/15/14 0315  NA 134* 135  --  134*  K 5.2* 5.3*  --  5.2*  CL 103 102  --  102  CO2 22  --   --  26  GLUCOSE 238* 233*  --  231*  BUN 35* 37*  --  28*  CREATININE 2.70* 2.40* 2.36* 1.89*  CALCIUM 9.0  --   --  8.7   Liver Function Tests  Recent Labs  05/14/14 1205 05/15/14 0315  AST 119* 91*  ALT 113* 118*  ALKPHOS 169* 161*  BILITOT 1.0 0.8  PROT 5.6* 5.8*  ALBUMIN 2.9* 3.0*   No results for input(s): LIPASE, AMYLASE in the last 72 hours. Cardiac Enzymes  Recent Labs  05/14/14 1545 05/14/14 2048 05/15/14 0315  TROPONINI 0.09* 0.08* 0.09*   BNP Invalid input(s): POCBNP D-Dimer No results for input(s): DDIMER  in the last 72 hours. Hemoglobin A1C  Recent Labs  05/14/14 1545  HGBA1C 10.3*   Fasting Lipid Panel  Recent Labs  05/15/14 0315  CHOL 91  HDL 38*  LDLCALC 40  TRIG 64  CHOLHDL 2.4   Thyroid Function Tests  Recent Labs  05/14/14 1545  TSH 0.317*    TELE  nsr  Radiology/Studies  Dg Chest 2 View  05/17/2014   CLINICAL DATA:  Sore arm.  Pacemaker.  EXAM: CHEST  2 VIEW  COMPARISON:  05/14/2014.  08/30/2013 .  FINDINGS: Prior CABG. Cardiac pacer with lead tips in right atrium and right ventricle. Stable cardiomegaly. No prominent pulmonary venous congestion. Stable left base pleural parenchymal scarring . No pneumothorax. No acute bony abnormality.  IMPRESSION: 1. Interim placement pacemaker. No complicating features noted. Prior CABG. Cardiomegaly. No pulmonary venous congestion. 2. Stable changes of left base pleural parenchymal scarring.   Electronically Signed   By: Maisie Fus  Register   On: 05/17/2014 08:10   Dg Chest 2 View  04/18/2014   CLINICAL  DATA:  Syncope.  EXAM: CHEST  2 VIEW  COMPARISON:  August 30, 2013.  FINDINGS: Stable cardiomediastinal silhouette. Status post coronary artery bypass graft. Stable blunting of left costophrenic sulcus is noted most consistent with scarring. No pneumothorax is noted. No significant pleural effusion is noted. No acute pulmonary disease is noted. Postsurgical changes are again noted involving the left ribs.  IMPRESSION: Postsurgical changes as described above. No acute cardiopulmonary abnormality seen.   Electronically Signed   By: Roque Lias M.D.   On: 04/18/2014 18:00   Dg Lumbar Spine 2-3 Views  05/09/2014   CLINICAL DATA:  Status post fall, initial visit  EXAM: LUMBAR SPINE - 2-3 VIEW  COMPARISON:  None.  FINDINGS: The lumbar vertebral bodies are preserved in height. The intervertebral disc space heights are reasonably well maintained. The pedicles and transverse processes are intact. There is lucency that projects through the pars region of L5 on the lateral view which may reflect bilateral pars defects. There is no significant spondylolisthesis. The observed portions of the sacrum are unremarkable. A right common iliac artery stent is visible.  IMPRESSION: There is mild degenerative endplate spurring at multiple lumbar levels without high-grade disc space narrowing or compression fracture. 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 This could be evaluated further with lumbar spine CT scanning.   Electronically Signed   By: David  [place]   On: 05/09/2014 16:48   Ct Head Wo Contrast  04/18/2014   CLINICAL DATA:  Syncope  EXAM: CT HEAD WITHOUT CONTRAST  TECHNIQUE: Contiguous axial images were obtained from the base of the skull through the vertex without intravenous contrast.  COMPARISON:  Multiple exams, including 08/30/2003 and 08/29/2003  FINDINGS: Faint hypodensity in the central upper pons and in the periventricular white matter compatible with chronic ischemic microvascular white matter disease.  Remote lacunar infarcts in the right putamen in the and right thalamus. Remote lacunar infarct in the medial left thalamus.  No intracranial hemorrhage, mass lesion, or acute CVA.  There is left pneumoparotid. Mild chronic bilateral maxillary sinusitis. There is atherosclerotic calcification of the cavernous carotid arteries bilaterally. Small right mastoid effusion.  IMPRESSION: 1. No acute intracranial findings. 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 2. Left pneumoparotid. Differential diagnostic considerations include a iatrogenic (recent dental instrumentation, general anesthesia with endotracheal intubation, or spirometry), chronic cough (COPD, cystic fibrosis, allergic rhinitis), and can be seen also in wind instrument musicians,  scuba divers, and glass blowers.   Electronically Signed   By: Herbie Baltimore M.D.   On: 04/18/2014 17:55   Ct Angio Chest Pe W/cm &/or Wo Cm  04/19/2014   CLINICAL DATA:  Syncopal episode yesterday, positive D-dimer  EXAM: CT ANGIOGRAPHY CHEST WITH CONTRAST  TECHNIQUE: Multidetector CT imaging of the chest was performed using the standard protocol during bolus administration of intravenous contrast. Multiplanar CT image reconstructions and MIPs were obtained to evaluate the vascular anatomy.  CONTRAST:  51mL OMNIPAQUE IOHEXOL 350 MG/ML SOLN  COMPARISON:  None.  FINDINGS: Sagittal images of the spine shows mild degenerative changes thoracic spine. The patient is status post median sternotomy.  Images of the thoracic inlet are unremarkable. Central airways are patent. Atherosclerotic calcifications of thoracic aorta and coronary arteries are noted. No pericardial effusion. Visualized upper abdomen shows tiny calcified flattening gallstones within gallbladder. No adrenal gland mass is noted in visualized upper abdomen.  The study is of excellent technical quality. No pulmonary embolus is noted.  There is prior left posterior rib resection. Old left rib  fracture. Small left pleural effusion with left base pleural thickening. There are diaphragmatic and basilar posterior pleural calcifications on left side. Probable sequela from prior infection or inflammatory process.  There is some atelectasis or scarring in left upper lobe posterior medially along the fissure. No acute infiltrate or pulmonary edema. There is no mediastinal hematoma or adenopathy. No hilar adenopathy. No axillary adenopathy. Status post CABG.  Review of the MIP images confirms the above findings.  IMPRESSION: 1. No pulmonary embolus is identified. 2. No acute infiltrate or pulmonary edema. Small left pleural effusion. There is pleural thickening and there are left base posterior and left diaphragmatic pleural calcifications probable sequela from prior infection or inflammation. Postsurgical changes left posterior ribs. 3. No mediastinal hematoma or adenopathy. 4. Atherosclerotic calcifications of thoracic aorta and coronary arteries. Status post CABG. 5. Small layering calcified gallstones are noted within gallbladder.   Electronically Signed   By: Natasha Mead M.D.   On: 04/19/2014 10:50   Dg Chest Portable 1 View  05/14/2014   CLINICAL DATA:  Chest pressure. History of CAD, diabetes and hyperlipidemia  EXAM: PORTABLE CHEST - 1 VIEW  COMPARISON:  04/18/2014; 08/30/2013; 06/21/2005; chest CT - 04/19/2014  FINDINGS: Grossly unchanged borderline enlarged cardiac silhouette and mediastinal contours post median sternotomy and CABG. External pacer devices overlie the cardiac apex. Mild pulmonary venous congestion without frank evidence of edema. There is chronic blunting of the left costophrenic angle without definite pleural effusion. No new focal airspace opacities. No pneumothorax. Unchanged bones.  IMPRESSION: Pulmonary venous congestion without definite acute cardiopulmonary disease on this AP portable examination.   Electronically Signed   By: Simonne Come M.D.   On: 05/14/2014 11:22     ASSESSMENT AND PLAN  1. Complete heart block 2. Ischemic CM 3. Chronic systolic heart failure 4. S/p BiV PPM 5. Elevated LFT's Rec: his device is functioning satisfactorily. Will plan to recheck LFT's. Ok to discharge later today.   Ladasha Schnackenberg,M.D.  05/17/2014 8:59 AM

## 2014-05-23 ENCOUNTER — Other Ambulatory Visit: Payer: Self-pay | Admitting: *Deleted

## 2014-05-23 MED ORDER — GLUCOSE BLOOD VI STRP: ORAL_STRIP | Status: DC

## 2014-05-23 MED ORDER — ACCU-CHEK AVIVA PLUS W/DEVICE KIT: PACK | Status: AC

## 2014-05-23 MED ORDER — ACCU-CHEK SOFTCLIX LANCETS MISC: Status: DC

## 2014-05-24 ENCOUNTER — Other Ambulatory Visit: Payer: Self-pay | Admitting: *Deleted

## 2014-05-24 ENCOUNTER — Ambulatory Visit: Payer: Medicare HMO | Admitting: Family Medicine

## 2014-05-24 MED ORDER — INSULIN GLARGINE 100 UNIT/ML ~~LOC~~ SOLN: 35.0000 [IU] | Freq: Every day | SUBCUTANEOUS | Status: DC

## 2014-05-30 ENCOUNTER — Ambulatory Visit: Payer: Medicare HMO | Admitting: Family Medicine

## 2014-05-30 ENCOUNTER — Ambulatory Visit (INDEPENDENT_AMBULATORY_CARE_PROVIDER_SITE_OTHER): Payer: Commercial Managed Care - HMO | Admitting: *Deleted

## 2014-05-30 DIAGNOSIS — I255 Ischemic cardiomyopathy: Secondary | ICD-10-CM

## 2014-05-30 DIAGNOSIS — I442 Atrioventricular block, complete: Secondary | ICD-10-CM | POA: Diagnosis not present

## 2014-05-30 LAB — MDC_IDC_ENUM_SESS_TYPE_INCLINIC
Battery Voltage: 2.98 V
Brady Statistic RA Percent Paced: 12 %
Brady Statistic RV Percent Paced: 99.49 %
Date Time Interrogation Session: 20160125153434
Implantable Pulse Generator Model: 3222
Implantable Pulse Generator Serial Number: 7714017
Lead Channel Impedance Value: 312.5 Ohm
Lead Channel Pacing Threshold Amplitude: 0.75 V
Lead Channel Pacing Threshold Amplitude: 0.75 V
Lead Channel Pacing Threshold Amplitude: 1 V
Lead Channel Pacing Threshold Amplitude: 1 V
Lead Channel Pacing Threshold Amplitude: 1.75 V
Lead Channel Pacing Threshold Pulse Width: 0.4 ms
Lead Channel Pacing Threshold Pulse Width: 0.4 ms
Lead Channel Pacing Threshold Pulse Width: 1 ms
Lead Channel Pacing Threshold Pulse Width: 1 ms
Lead Channel Sensing Intrinsic Amplitude: 5 mV
Lead Channel Setting Pacing Amplitude: 3.5 V
Lead Channel Setting Pacing Amplitude: 4.5 V
Lead Channel Setting Sensing Sensitivity: 4 mV
MDC IDC MSMT BATTERY REMAINING LONGEVITY: 30 mo
MDC IDC MSMT LEADCHNL LV PACING THRESHOLD AMPLITUDE: 1.75 V
MDC IDC MSMT LEADCHNL RA IMPEDANCE VALUE: 362.5 Ohm
MDC IDC MSMT LEADCHNL RA PACING THRESHOLD PULSEWIDTH: 0.4 ms
MDC IDC MSMT LEADCHNL RA PACING THRESHOLD PULSEWIDTH: 0.4 ms
MDC IDC MSMT LEADCHNL RV IMPEDANCE VALUE: 425 Ohm
MDC IDC MSMT LEADCHNL RV SENSING INTR AMPL: 12 mV
MDC IDC SET LEADCHNL LV PACING PULSEWIDTH: 1 ms
MDC IDC SET LEADCHNL RA PACING AMPLITUDE: 3.5 V
MDC IDC SET LEADCHNL RV PACING PULSEWIDTH: 0.4 ms

## 2014-05-30 NOTE — Progress Notes (Addendum)
Wound check appointment. Steri-strips removed. Wound without redness or edema. Incision edges approximated, wound well healed. Hematoma present---JA saw pt, no changes made. Stitch removed from R corner of incision site, antibiotic ointment and bandage applied. Normal device function. Thresholds, sensing, and impedances consistent with implant measurements. Device programmed at 3.5V for extra safety margin until 3 month visit. Histogram distribution appropriate for patient and level of activity. No mode switches or high ventricular rates noted. Patient educated about wound care, arm mobility, lifting restrictions. ROV w/GT on 4/18 @3 :00am.

## 2014-06-02 ENCOUNTER — Encounter: Payer: Self-pay | Admitting: Family Medicine

## 2014-06-02 ENCOUNTER — Ambulatory Visit (INDEPENDENT_AMBULATORY_CARE_PROVIDER_SITE_OTHER): Payer: Commercial Managed Care - HMO | Admitting: Family Medicine

## 2014-06-02 VITALS — BP 124/60 | HR 72 | Temp 96.7°F | Ht 67.99 in | Wt 160.0 lb

## 2014-06-02 DIAGNOSIS — N400 Enlarged prostate without lower urinary tract symptoms: Secondary | ICD-10-CM

## 2014-06-02 DIAGNOSIS — E1140 Type 2 diabetes mellitus with diabetic neuropathy, unspecified: Secondary | ICD-10-CM

## 2014-06-02 DIAGNOSIS — Z89512 Acquired absence of left leg below knee: Secondary | ICD-10-CM

## 2014-06-02 DIAGNOSIS — E1165 Type 2 diabetes mellitus with hyperglycemia: Secondary | ICD-10-CM

## 2014-06-02 DIAGNOSIS — E559 Vitamin D deficiency, unspecified: Secondary | ICD-10-CM

## 2014-06-02 DIAGNOSIS — N183 Chronic kidney disease, stage 3 unspecified: Secondary | ICD-10-CM

## 2014-06-02 DIAGNOSIS — Z89511 Acquired absence of right leg below knee: Secondary | ICD-10-CM

## 2014-06-02 DIAGNOSIS — I739 Peripheral vascular disease, unspecified: Secondary | ICD-10-CM

## 2014-06-02 DIAGNOSIS — E785 Hyperlipidemia, unspecified: Secondary | ICD-10-CM

## 2014-06-02 DIAGNOSIS — Z950 Presence of cardiac pacemaker: Secondary | ICD-10-CM

## 2014-06-02 DIAGNOSIS — IMO0002 Reserved for concepts with insufficient information to code with codable children: Secondary | ICD-10-CM

## 2014-06-02 LAB — POCT UA - MICROALBUMIN: Microalbumin Ur, POC: NEGATIVE mg/L

## 2014-06-02 NOTE — Addendum Note (Signed)
Addended by: Magdalene River on: 06/02/2014 04:13 PM   Modules accepted: Orders

## 2014-06-02 NOTE — Patient Instructions (Addendum)
Medicare Annual Wellness Visit  [facility] and the medical providers at [facility] strive to bring you the best medical care.  In doing so we not only want to address your current medical conditions and concerns but also to detect new conditions early and prevent illness, disease and health-related problems.    Medicare offers a yearly Wellness Visit which allows our clinical staff to assess your need for preventative services including immunizations, lifestyle education, counseling to decrease risk of preventable diseases and screening for fall risk and other medical concerns.    This visit is provided free of charge (no copay) for all Medicare recipients. The clinical pharmacists at [facility] have begun to conduct these Wellness Visits which will also include a thorough review of all your medications.    As you primary medical provider recommend that you make an appointment for your Annual Wellness Visit if you have not done so already this year.  You may set up this appointment before you leave today or you may call back ([phone]) and schedule an appointment.  Please make sure when you call that you mention that you are scheduling your Annual Wellness Visit with the clinical pharmacist so that the appointment may be made for the proper length of time.    Continue current medications. Continue good therapeutic lifestyle changes which include good diet and exercise. Fall precautions discussed with patient. If an FOBT was given today- please return it to our front desk. If you are over 40 years old - you may need Prevnar 13 or the adult Pneumonia vaccine.  Flu Shots will be available at our office starting mid- September. Please call and schedule a FLU CLINIC APPOINTMENT.   Keep follow-up appointment with cardiology Keep follow-up appointment with diabetic educator in practice Stay as active as possible physically Return  the FOBT Please complete visit evaluation as requested

## 2014-06-02 NOTE — Progress Notes (Signed)
Subjective:    Patient ID: Christopher Padilla, male    DOB: May 07, 1940, 74 y.o.   MRN: [id number]  HPI Pt here for follow up and management of chronic medical problems which include CKD, diabetes, hypothyroid, and hyperlipidemia. He was recently in the hospital and a pacemaker placed. He is taking medications regularly. The patient also has continued problems with his elevated blood sugar and this was noted even from hemoglobin A1c's that were barely elevated in the hospital. He is always had a problem with this. He always says he feels better with his blood sugars higher and we always have difficulty getting it under good control. He does have a follow-up appointment with the clinical pharmacists and we will continue to do the best we can to get this A1c down to a more reasonable level and the patient understands this. Recent lab work done and will be reviewed with him. The blood sugar was 264 and several other liver function tests were slightly elevated. He is due to receive an FOBT today and a urine microalbumin. His last A1c was 10.3 in the hospital. The patient is in good spirits and is working on getting a new prosthesis for his right leg and he is excited about that. He is recovering slowly from the pacemaker insertion and still has some bruising around the site of insertion. He has a follow-up appointment with the cardiologist in April.           Patient Active Problem List   Diagnosis Date Noted  . CKD stage 3 due to type 2 diabetes mellitus   . Cardiomyopathy, ischemic   . Coronary artery disease involving native coronary artery of native heart without angina pectoris   . Complete heart block 05/14/2014  . Syncope 04/18/2014  . Accelerated hypertension 04/18/2014  . Diabetes mellitus type 2, uncontrolled 04/18/2014  . Hx of BKA, bilateral 05/03/2013  . Mitral regurgitation 07/03/2011  . Hypothyroidism 11/05/2008  . 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 Hyperlipidemia LDL goal <70 11/05/2008  . CEREBROVASCULAR DISEASE 11/05/2008  . TOBACCO ABUSE, HX OF 11/05/2008  . Coronary atherosclerosis 10/04/2008  . HEART FAILURE, COMBINED UNSPEC. 10/04/2008   Outpatient Encounter Prescriptions as of 06/02/2014  Medication Sig  . ACCU-CHEK SOFTCLIX LANCETS lancets Check BG 4Xdaily DX E11.65  . aspirin 81 MG tablet Take 81 mg by mouth daily.    . BD INSULIN SYRINGE ULTRAFINE 31G X 15/64" 0.5 ML MISC USE AS DIRECTED  . Blood Glucose Monitoring Suppl (ACCU-CHEK AVIVA PLUS) W/DEVICE KIT Check BG 4XDaily DX E11.65  . carvedilol (COREG) 3.125 MG tablet Take 1 tablet (3.125 mg total) by mouth 2 (two) times daily with a meal.  . Cholecalciferol (VITAMIN D PO) Take 1 tablet by mouth 2 (two) times daily.  Marland Kitchen gabapentin (NEURONTIN) 600 MG tablet TAKE 1 CAPSULE BY MOUTH THREE TIMES A DAY (Patient taking differently: Take 600 mg by mouth 2 (two) times daily. )  . glucose blood (ACCU-CHEK AVIVA PLUS) test strip Check BG 4xdaily DX Ell.65  . insulin glargine (LANTUS) 100 UNIT/ML injection Inject 0.35 mLs (35 Units total) into the skin at bedtime.  . insulin lispro (HUMALOG) 100 UNIT/ML injection INJECT 0-20 UNITS INTO THE SKIN AS DIRECTED. SLIDING SCALE  . levothyroxine (SYNTHROID, LEVOTHROID) 125 MCG tablet TAKE 1 TABLET BY MOUTH DAILY  . Menthol-Methyl Salicylate (MUSCLE RUB) 10-15 % CREA Apply 1 application topically daily as needed for muscle pain.  . nortriptyline (PAMELOR) 25 MG capsule TAKE  1 CAPSULE BY MOUTH AT BEDTIME  . omeprazole (PRILOSEC) 20 MG capsule TAKE 1 CAPSULE BY MOUTH DAILY  . HYDROcodone-acetaminophen (NORCO/VICODIN) 5-325 MG per tablet Take 1 tablet by mouth every 4 (four) hours as needed for moderate pain. (Patient not taking: Reported on 06/02/2014)    Review of Systems  Constitutional: Negative.   HENT: Negative.   Eyes: Negative.   Respiratory: Negative.   Cardiovascular: Negative.        Recent pacemaker    Gastrointestinal: Negative.   Endocrine: Negative.   Genitourinary: Negative.   Musculoskeletal: Negative.   Skin: Negative.   Allergic/Immunologic: Negative.   Neurological: Negative.   Hematological: Negative.   Psychiatric/Behavioral: Negative.        Objective:   Physical Exam  Constitutional: He is oriented to person, place, and time. He appears well-developed and well-nourished.  Patient comes in today and he is alert and positive would try to recover from the recent pacemaker implant.  HENT:  Head: Normocephalic and atraumatic.  Right Ear: External ear normal.  Left Ear: External ear normal.  Nose: Nose normal.  Mouth/Throat: Oropharynx is clear and moist. No oropharyngeal exudate.  Eyes: Conjunctivae and EOM are normal. Pupils are equal, round, and reactive to light. Right eye exhibits no discharge. Left eye exhibits no discharge. No scleral icterus.  Neck: Normal range of motion. Neck supple. No tracheal deviation present. No thyromegaly present.  The patient has bilateral supraclavicular bruits and a right carotid bruit.  Cardiovascular: Normal rate, regular rhythm and normal heart sounds.  Exam reveals no gallop and no friction rub.   No murmur heard. The rhythm is regular today at 72/m. The pulses in the upper extremities appear to be somewhat diminished and he is a bilateral below the knee amputee secondary to vascular insufficiency and diabetes  Pulmonary/Chest: Effort normal. No respiratory distress. He has no wheezes. He has no rales. He exhibits no tenderness.  Breath sounds were somewhat diminished on the left side posteriorly  Abdominal: Soft. Bowel sounds are normal. He exhibits no mass. There is no tenderness. There is no rebound and no guarding.  Without masses or tenderness  Musculoskeletal: He exhibits no edema or tenderness.  The patient has some problems with arising from a sitting position due to both prostheses. He uses a cane for ambulation.   Lymphadenopathy:    He has no cervical adenopathy.  Neurological: He is alert and oriented to person, place, and time. He has normal reflexes. No cranial nerve deficit.  Skin: Skin is warm and dry. No rash noted. No erythema. No pallor.  Psychiatric: He has a normal mood and affect. His behavior is normal. Judgment and thought content normal.  Nursing note and vitals reviewed.  BP 124/60 mmHg  Pulse 72  Temp(Src) 96.7 F (35.9 C) (Oral)  Ht 5' 7.99" (1.727 m)  Wt 160 lb (72.576 kg)  BMI 24.33 kg/m2        Assessment & Plan:  1. Type 2 diabetes, uncontrolled, with neuropathy -Continue follow-up with diabetic educator and work on increasing physical activity and following diet  2. BPH (benign prostatic hyperplasia)  3. Hyperlipidemia LDL goal <70 -Continue follow-up with cardiology  4. Vitamin D deficiency -Continue current treatment  5. Status post placement of cardiac pacemaker -Continue follow-up with cardiology in April  6. Chronic kidney disease, stage 3 (moderate) -Continue to avoid all NSAIDs and monitor blood pressure and keep it under good control  7. Peripheral vascular insufficiency -Continue with fitting of knee prosthesis  and monitoring skin condition of both legs  8. S/P bilateral BKA (below knee amputation) -Continue good wound care and get refitted with prosthesis  No orders of the defined types were placed in this encounter.   Patient Instructions                       Medicare Annual Wellness Visit  [facility] and the medical providers at [facility] strive to bring you the best medical care.  In doing so we not only want to address your current medical conditions and concerns but also to detect new conditions early and prevent illness, disease and health-related problems.    Medicare offers a yearly Wellness Visit which allows our clinical staff to assess your need for preventative services including immunizations, lifestyle  education, counseling to decrease risk of preventable diseases and screening for fall risk and other medical concerns.    This visit is provided free of charge (no copay) for all Medicare recipients. The clinical pharmacists at [facility] have begun to conduct these Wellness Visits which will also include a thorough review of all your medications.    As you primary medical provider recommend that you make an appointment for your Annual Wellness Visit if you have not done so already this year.  You may set up this appointment before you leave today or you may call back ([phone]) and schedule an appointment.  Please make sure when you call that you mention that you are scheduling your Annual Wellness Visit with the clinical pharmacist so that the appointment may be made for the proper length of time.    Continue current medications. Continue good therapeutic lifestyle changes which include good diet and exercise. Fall precautions discussed with patient. If an FOBT was given today- please return it to our front desk. If you are over 74 years old - you may need Prevnar 4 or the adult Pneumonia vaccine.  Flu Shots will be available at our office starting mid- September. Please call and schedule a FLU CLINIC APPOINTMENT.   Keep follow-up appointment with cardiology Keep follow-up appointment with diabetic educator in practice Stay as active as possible physically Return the FOBT Please complete visit evaluation as requested   Arrie Senate MD

## 2014-06-02 NOTE — Addendum Note (Signed)
Addended by: Prescott Gum on: 06/02/2014 04:22 PM   Modules accepted: Kipp Brood

## 2014-06-14 ENCOUNTER — Encounter: Payer: Self-pay | Admitting: Internal Medicine

## 2014-06-27 ENCOUNTER — Ambulatory Visit: Payer: Commercial Managed Care - HMO

## 2014-07-04 DIAGNOSIS — Z48812 Encounter for surgical aftercare following surgery on the circulatory system: Secondary | ICD-10-CM | POA: Diagnosis not present

## 2014-07-04 DIAGNOSIS — Z89511 Acquired absence of right leg below knee: Secondary | ICD-10-CM | POA: Diagnosis not present

## 2014-07-04 DIAGNOSIS — Z950 Presence of cardiac pacemaker: Secondary | ICD-10-CM | POA: Diagnosis not present

## 2014-07-04 DIAGNOSIS — I2510 Atherosclerotic heart disease of native coronary artery without angina pectoris: Secondary | ICD-10-CM | POA: Diagnosis not present

## 2014-07-04 DIAGNOSIS — K219 Gastro-esophageal reflux disease without esophagitis: Secondary | ICD-10-CM | POA: Diagnosis not present

## 2014-07-04 DIAGNOSIS — I252 Old myocardial infarction: Secondary | ICD-10-CM | POA: Diagnosis not present

## 2014-07-04 DIAGNOSIS — M150 Primary generalized (osteo)arthritis: Secondary | ICD-10-CM | POA: Diagnosis not present

## 2014-07-20 ENCOUNTER — Other Ambulatory Visit: Payer: Self-pay | Admitting: Family Medicine

## 2014-07-21 ENCOUNTER — Telehealth: Payer: Self-pay

## 2014-07-25 ENCOUNTER — Ambulatory Visit (INDEPENDENT_AMBULATORY_CARE_PROVIDER_SITE_OTHER): Payer: Commercial Managed Care - HMO | Admitting: Pharmacist

## 2014-07-25 ENCOUNTER — Encounter: Payer: Self-pay | Admitting: Pharmacist

## 2014-07-25 ENCOUNTER — Ambulatory Visit (INDEPENDENT_AMBULATORY_CARE_PROVIDER_SITE_OTHER): Payer: Commercial Managed Care - HMO

## 2014-07-25 VITALS — Ht 68.0 in | Wt 160.0 lb

## 2014-07-25 DIAGNOSIS — T148 Other injury of unspecified body region: Secondary | ICD-10-CM

## 2014-07-25 DIAGNOSIS — M899 Disorder of bone, unspecified: Secondary | ICD-10-CM | POA: Diagnosis not present

## 2014-07-25 DIAGNOSIS — IMO0002 Reserved for concepts with insufficient information to code with codable children: Secondary | ICD-10-CM

## 2014-07-25 DIAGNOSIS — M8580 Other specified disorders of bone density and structure, unspecified site: Secondary | ICD-10-CM

## 2014-07-25 NOTE — Patient Instructions (Signed)
Increase calcium intake through diet (see handout with calcium rich foods) or add calcium citrate 500 to  a day.  Combining diet and calcium supplement to get goal of  of calcium per day.  Change omeprazole to 1 capsule every other day for 1 month.  If no worsening of reflux / heart burn then try to discontinue.  You can take 1 to 2 a day if needed.    Fall Prevention and Home Safety Falls cause injuries and can affect all age groups. It is possible to use preventive measures to significantly decrease the likelihood of falls. There are many simple measures which can make your home safer and prevent falls. OUTDOORS  Repair cracks and edges of walkways and driveways.  Remove high doorway thresholds.  Trim shrubbery on the main path into your home.  Have good outside lighting.  Clear walkways of tools, rocks, debris, and clutter.  Check that handrails are not broken and are securely fastened. Both sides of steps should have handrails.  Have leaves, snow, and ice cleared regularly.  Use sand or salt on walkways during winter months.  In the garage, clean up grease or oil spills. BATHROOM  Install night lights.  Install grab bars by the toilet and in the tub and shower.  Use non-skid mats or decals in the tub or shower.  Place a plastic non-slip stool in the shower to sit on, if needed.  Keep floors dry and clean up all water on the floor immediately.  Remove soap buildup in the tub or shower on a regular basis.  Secure bath mats with non-slip, double-sided rug tape.  Remove throw rugs and tripping hazards from the floors. BEDROOMS  Install night lights.  Make sure a bedside light is easy to reach.  Do not use oversized bedding.  Keep a telephone by your bedside.  Have a firm chair with side arms to use for getting dressed.  Remove throw rugs and tripping hazards from the floor. KITCHEN  Keep handles on pots and pans turned toward the center of the  stove. Use back burners when possible.  Clean up spills quickly and allow time for drying.  Avoid walking on wet floors.  Avoid hot utensils and knives.  Position shelves so they are not too high or low.  Place commonly used objects within easy reach.  If necessary, use a sturdy step stool with a grab bar when reaching.  Keep electrical cables out of the way.  Do not use floor polish or wax that makes floors slippery. If you must use wax, use non-skid floor wax.  Remove throw rugs and tripping hazards from the floor. STAIRWAYS  Never leave objects on stairs.  Place handrails on both sides of stairways and use them. Fix any loose handrails. Make sure handrails on both sides of the stairways are as long as the stairs.  Check carpeting to make sure it is firmly attached along stairs. Make repairs to worn or loose carpet promptly.  Avoid placing throw rugs at the top or bottom of stairways, or properly secure the rug with carpet tape to prevent slippage. Get rid of throw rugs, if possible.  Have an electrician put in a light switch at the top and bottom of the stairs. OTHER FALL PREVENTION TIPS  Wear low-heel or rubber-soled shoes that are supportive and fit well. Wear closed toe shoes.  When using a stepladder, make sure it is fully opened and both spreaders are firmly locked. Do not climb a  closed stepladder.  Add color or contrast paint or tape to grab bars and handrails in your home. Place contrasting color strips on first and last steps.  Learn and use mobility aids as needed. Install an electrical emergency response system.  Turn on lights to avoid dark areas. Replace light bulbs that burn out immediately. Get light switches that glow.  Arrange furniture to create clear pathways. Keep furniture in the same place.  Firmly attach carpet with non-skid or double-sided tape.  Eliminate uneven floor surfaces.  Select a carpet pattern that does not visually hide the edge of  steps.  Be aware of all pets. OTHER HOME SAFETY TIPS  Set the water temperature for 120 F (48.8 C).  Keep emergency numbers on or near the telephone.  Keep smoke detectors on every level of the home and near sleeping areas. Document Released: 04/12/2002 Document Revised: 10/22/2011 Document Reviewed: 07/12/2011 [facility] Patient Information 2015 [place], [place]. This information is not intended to replace advice given to you by your health care provider. Make sure you discuss any questions you have with your health care provider.

## 2014-07-25 NOTE — Progress Notes (Signed)
Osteoporosis Clinic Current Height: Height: 5\' 8"  (172.7 cm)      Max Lifetime Height:  5\' 8"  Current Weight: Weight: 160 lb (72.576 kg)       Ethnicity:Caucasian    HPI: Does pt already have a diagnosis of:  Osteopenia?  No Osteoporosis?  No  Back Pain?  yes    Kyphosis?  No Prior fracture?  No Med(s) for Osteoporosis/Osteopenia:  none Med(s) previously tried for Osteoporosis/Osteopenia:  none                                                             PMH: Steroid Use?  No Thyroid med?  Yes History of cancer?  No History of digestive disorders (ie Crohn's)?  Yes - GERD and on chronic PPI Current or previous eating disorders?  No Last Vitamin D Result:  26.6 (05/31/2013) Last GFR Result:  33 (01/110/2016)   FH/SH: Family history of osteoporosis?  No Parent with history of hip fracture?  No Family history of breast cancer?  No Exercise?  No Smoking?  No Alcohol?  No    Calcium Assessment Calcium Intake  # of servings/day  Calcium mg  Milk (8 oz) 0.5  x  300  = 150mg   Yogurt (4 oz) 0 x  200 = 0  Cheese (1 oz) 1 x  200 = 200mg   Other Calcium sources   250mg   Ca supplement 0 = 0   Estimated calcium intake per day 600mg     DEXA Results Date of Test T-Score for AP Spine L1-L4 T-Score for Total Left Hip T-Score for Total Right Hip  07/25/2014 4.8 -0.2 0.0                 Lowest T-Score = -1.5 at neck of right hip  FRAX 10 year estimate: Total FX risk:  7.7%  (consider medication if >/= 20%) Hip FX risk:  2.5%  (consider medication if >/= 3%)  Assessment: Osteopenia with low FRAX risk  Recommendations: 1.   Discussed BMD  / DEXA results and discussed fracture risk. 2.  recommend calcium 1200mg  daily through supplementation or diet.  3.  recommend weight bearing exercise - 30 minutes at least 4 days per week.   4.  Counseled and educated about fall risk and prevention. 5.  Recommend decrease omeprazole to every other day for 1 month - if no increased GERD or  heartburn, then try to discontinue.  May use 1 or 2 tums as needed as long as not taking daily.  Recheck DEXA:  2 years  Time spent counseling patient:  30 minutes   Henrene Pastor, PharmD, CPP

## 2014-08-19 ENCOUNTER — Encounter: Payer: Self-pay | Admitting: *Deleted

## 2014-08-22 ENCOUNTER — Encounter: Payer: Self-pay | Admitting: Internal Medicine

## 2014-08-22 ENCOUNTER — Ambulatory Visit (INDEPENDENT_AMBULATORY_CARE_PROVIDER_SITE_OTHER): Payer: Commercial Managed Care - HMO | Admitting: Internal Medicine

## 2014-08-22 VITALS — BP 130/74 | HR 78 | Ht 68.0 in | Wt 155.6 lb

## 2014-08-22 DIAGNOSIS — I459 Conduction disorder, unspecified: Secondary | ICD-10-CM | POA: Diagnosis not present

## 2014-08-22 DIAGNOSIS — I255 Ischemic cardiomyopathy: Secondary | ICD-10-CM

## 2014-08-22 DIAGNOSIS — I442 Atrioventricular block, complete: Secondary | ICD-10-CM

## 2014-08-22 DIAGNOSIS — Z950 Presence of cardiac pacemaker: Secondary | ICD-10-CM | POA: Diagnosis not present

## 2014-08-22 LAB — MDC_IDC_ENUM_SESS_TYPE_INCLINIC
Battery Voltage: 2.95 V
Brady Statistic RA Percent Paced: 4.2 %
Brady Statistic RV Percent Paced: 99 %
Implantable Pulse Generator Serial Number: 7714017
Lead Channel Impedance Value: 362.5 Ohm
Lead Channel Impedance Value: 425 Ohm
Lead Channel Impedance Value: 437.5 Ohm
Lead Channel Pacing Threshold Amplitude: 0.75 V
Lead Channel Pacing Threshold Amplitude: 1.5 V
Lead Channel Pacing Threshold Pulse Width: 0.4 ms
Lead Channel Pacing Threshold Pulse Width: 0.5 ms
Lead Channel Sensing Intrinsic Amplitude: 12 mV
Lead Channel Setting Pacing Amplitude: 2 V
Lead Channel Setting Pacing Amplitude: 2 V
Lead Channel Setting Pacing Pulse Width: 0.4 ms
Lead Channel Setting Pacing Pulse Width: 0.5 ms
Lead Channel Setting Sensing Sensitivity: 4 mV
MDC IDC MSMT BATTERY REMAINING LONGEVITY: 75.6 mo
MDC IDC MSMT LEADCHNL RA PACING THRESHOLD AMPLITUDE: 1 V
MDC IDC MSMT LEADCHNL RA PACING THRESHOLD PULSEWIDTH: 0.4 ms
MDC IDC MSMT LEADCHNL RA SENSING INTR AMPL: 5 mV
MDC IDC SESS DTM: 20160418152956
MDC IDC SET LEADCHNL LV PACING AMPLITUDE: 2.5 V

## 2014-08-22 NOTE — Patient Instructions (Signed)
Medication Instructions:  Your physician recommends that you continue on your current medications as directed. Please refer to the Current Medication list given to you today.   Labwork: none  Testing/Procedures: none  Follow-Up: Your physician wants you to follow-up in: January 2017. You will receive a reminder letter in the mail two months in advance. If you don't receive a letter, please call our office to schedule the follow-up appointment.

## 2014-08-22 NOTE — Progress Notes (Signed)
HPI [name] returns today for followup. He is a pleasant 74 yo man with severe vascular disease and chronic systolic heart failure, and LBBB who underwent BiV PPM insertion 3 months ago. In the interim he has improved. His DM was out of control but now his hgbA1C is better. He denies syncope. His activity has improved but he is limited by bilateral prosthesis.   Allergies  Allergen Reactions  . Codeine     hallucinations  . Ciprofloxacin Diarrhea  . Nsaids Other (See Comments)    GI upset     Current Outpatient Prescriptions  Medication Sig Dispense Refill  . ACCU-CHEK SOFTCLIX LANCETS lancets Check BG 4Xdaily DX E11.65 300 each 1  . aspirin 81 MG tablet Take 81 mg by mouth daily.      . BD INSULIN SYRINGE ULTRAFINE 31G X 15/64" 0.5 ML MISC USE AS DIRECTED 100 each 2  . Blood Glucose Monitoring Suppl (ACCU-CHEK AVIVA PLUS) W/DEVICE KIT Check BG 4XDaily DX E11.65 1 kit 0  . carvedilol (COREG) 3.125 MG tablet Take 1 tablet (3.125 mg total) by mouth 2 (two) times daily with a meal. 60 tablet 3  . Cholecalciferol (VITAMIN D PO) Take 1,000 Units by mouth daily.     Marland Kitchen gabapentin (NEURONTIN) 600 MG tablet TAKE 1 CAPSULE BY MOUTH THREE TIMES A DAY (Patient taking differently: Take 600 mg by mouth 2 (two) times daily. ) 270 tablet 2  . glucose blood (ACCU-CHEK AVIVA PLUS) test strip Check BG 4xdaily DX Ell.65 300 each 1  . insulin glargine (LANTUS) 100 UNIT/ML injection Inject 0.35 mLs (35 Units total) into the skin at bedtime. 30 mL 2  . insulin lispro (HUMALOG) 100 UNIT/ML injection INJECT 0-20 UNITS INTO THE SKIN AS DIRECTED. SLIDING SCALE 10 mL 5  . levothyroxine (SYNTHROID, LEVOTHROID) 125 MCG tablet TAKE 1 TABLET BY MOUTH DAILY 90 tablet 5  . Menthol-Methyl Salicylate (MUSCLE RUB) 10-15 % CREA Apply 1 application topically daily as needed for muscle pain.    . nortriptyline (PAMELOR) 25 MG capsule TAKE 1 CAPSULE BY MOUTH AT BEDTIME 90 capsule 3  . omeprazole (PRILOSEC) 20 MG  capsule TAKE 1 CAPSULE BY MOUTH DAILY 90 capsule 3   No current facility-administered medications for this visit.     Past Medical History  Diagnosis Date  . CAD (coronary artery disease)     S/P CABG, Feb 2003, LIMA to the LAD, left free radial artery to an obtuse marginal, spahenous vein graft to diagonal, saphenous vein graft to RCA with endarterectomy  . S/P below knee amputation   . Cerebrovascular disease     MRA in 2005 with 75% stenosis in distal right vertebral artery and 75% stenosis greater in distal cervical internal carotid artery on teh right, severe bilateral disease and MRA of the extracranial circulation, high grade stenosis of the distal right internal carotid artery at the junction of the cervical internal carotid artery of the skull base  . Diabetes mellitus   . Hyperlipidemia   . Hypothyroidism   . History of tobacco use     Quit in 1990  . Myocardial infarction 2002  . GERD (gastroesophageal reflux disease)   . Arthritis   . Cataract   . Complete heart block 05/14/2014    St. Jude BiV PM (serial number [id number]) pacemaker  . CKD stage 3 due to type 2 diabetes mellitus   . Osteopenia 2016    ROS:   All systems reviewed and negative except  as noted in the HPI.   Past Surgical History  Procedure Laterality Date  . Leg amputation below knee      Bilateral  . Arterial bypass surgry      Left; left femoral to posterior tibial bypass gracting, left femoral artery and deep femoral artery endarterectomy with vein patch angioplasty of the common femoral arter and deep femoral artery 06/2005, right fremoral popliteal bypass, right iliac artery restent, bilateral below knee amputations  . Thoracotomy      With drainage of a hemathroax and decortication of fibrothorax  . Coronary artery bypass graft  2002    By Ivin Poot, MD  . Cataract extraction      Bilateral  . Carpal tunnel release      Right  . Amputation  10/17/2011    Procedure: AMPUTATION DIGIT;   Surgeon: Newt Minion, MD;  Location: [facility];  Service: Orthopedics;  Laterality: Left;  Amputation Left Index Finger at PIP Joint  . Finger surgery Right     pointer  . Left heart catheterization with coronary angiogram N/A 05/10/2014    Procedure: LEFT HEART CATHETERIZATION WITH CORONARY ANGIOGRAM;  Surgeon: Burnell Blanks, MD;  Location: [facility] C[facility] LAB;  Service: Cardiovascular;  Laterality: N/A;  . Temporary pacemaker insertion N/A 05/14/2014    Procedure: TEMPORARY PACEMAKER INSERTION;  Surgeon: Blane Ohara, MD;  Location: [facility] CATH LAB;  Service: Cardiovascular;  Laterality: N/A;  . Bi-ventricular pacemaker insertion N/A 05/16/2014    Procedure: BI-VENTRICULAR PACEMAKER INSERTION (CRT-P);  Surgeon: Evans Lance, MD; St. Jude BiV PM (serial number [id number]) pacemaker     Family History  Problem Relation Age of Onset  . Cancer Mother     brain and lung  . Diabetes Father 43  . Coronary artery disease Father   . Colon cancer Neg Hx   . Diabetes Sister   . Diabetes Brother   . Diabetes Son      History   Social History  . Marital Status: Married    Spouse Name: N/A  . Number of Children: N/A  . Years of Education: N/A   Occupational History  . Retired    Social History Main Topics  . Smoking status: Former Smoker -- 1.00 packs/day    Types: Cigarettes    Start date: 11/04/1954    Quit date: 05/06/1988  . Smokeless tobacco: Never Used  . Alcohol Use: No  . Drug Use: No  . Sexual Activity: Not Currently   Other Topics Concern  . Not on file   Social History Narrative   Married with 2 children     BP 130/74 mmHg  Pulse 78  Ht 5' 8"  (1.727 m)  Wt 155 lb 9.6 oz (70.58 kg)  BMI 23.66 kg/m2  SpO2 98%  Physical Exam:  Well appearing 74 yo man, NAD HEENT: Unremarkable Neck:  No JVD, no thyromegally Lymphatics:  No adenopathy Back:  No CVA tenderness Lungs:  Clear with no wheezes HEART:  Regular rate rhythm, no murmurs, no rubs, no clicks Abd:  soft,  positive bowel sounds, no organomegally, no rebound, no guarding Ext:  Bilateral amputations with prosthesis in place. Skin:  No rashes no nodules Neuro:  CN II through XII intact, motor grossly intact   DEVICE  Normal device function.  See PaceArt for details.   Assess/Plan:

## 2014-08-22 NOTE — Assessment & Plan Note (Signed)
His device is working normally. We have reprogrammed his outputs to maximize battery longevity. Will recheck in several months.

## 2014-08-22 NOTE — Assessment & Plan Note (Signed)
No anginal symptoms. He is encouraged to increase his physical activity as much as his prosthesis will allow.

## 2014-08-22 NOTE — Assessment & Plan Note (Signed)
He's had no recurrent syncope since his PPM was placed. Will follow.

## 2014-09-22 IMAGING — CR DG CERVICAL SPINE COMPLETE 4+V
6 series · 6 of 6 positions shown · non-contrast
Comparison: None.

CLINICAL DATA: Hand pain

CERVICAL SPINE - COMPLETE 4+ VIEW

[view not recorded (1 of 6)]
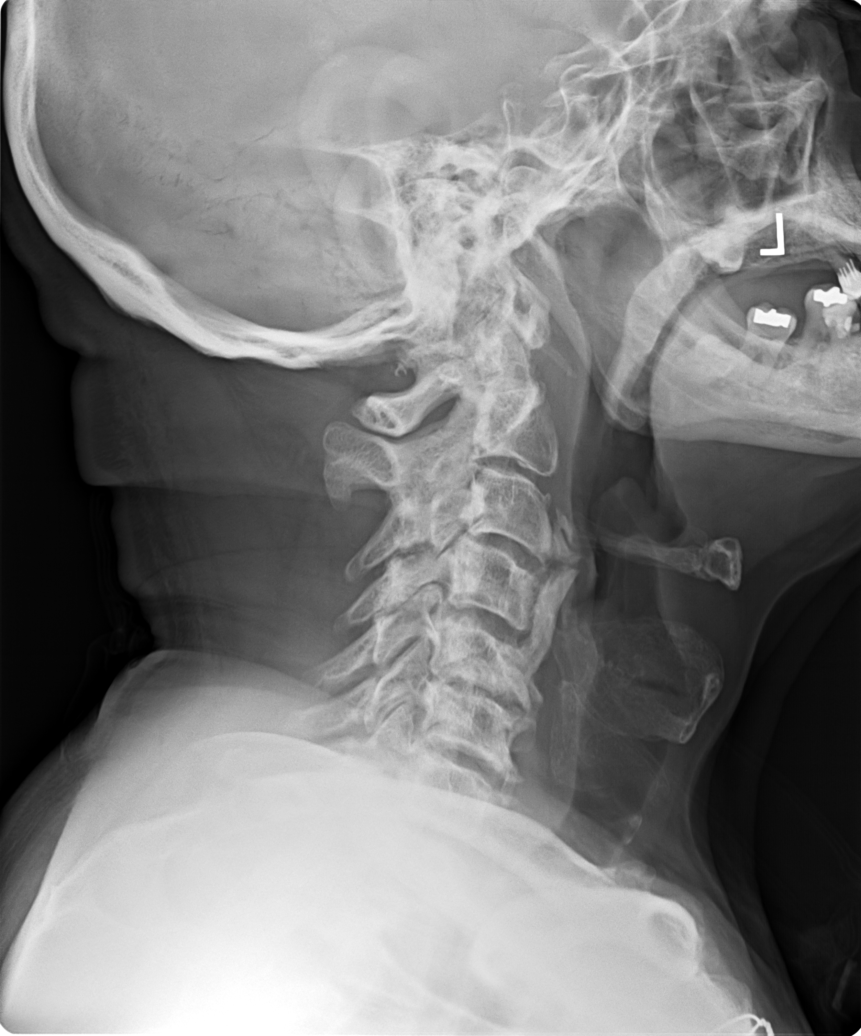

[view not recorded (2 of 6)]
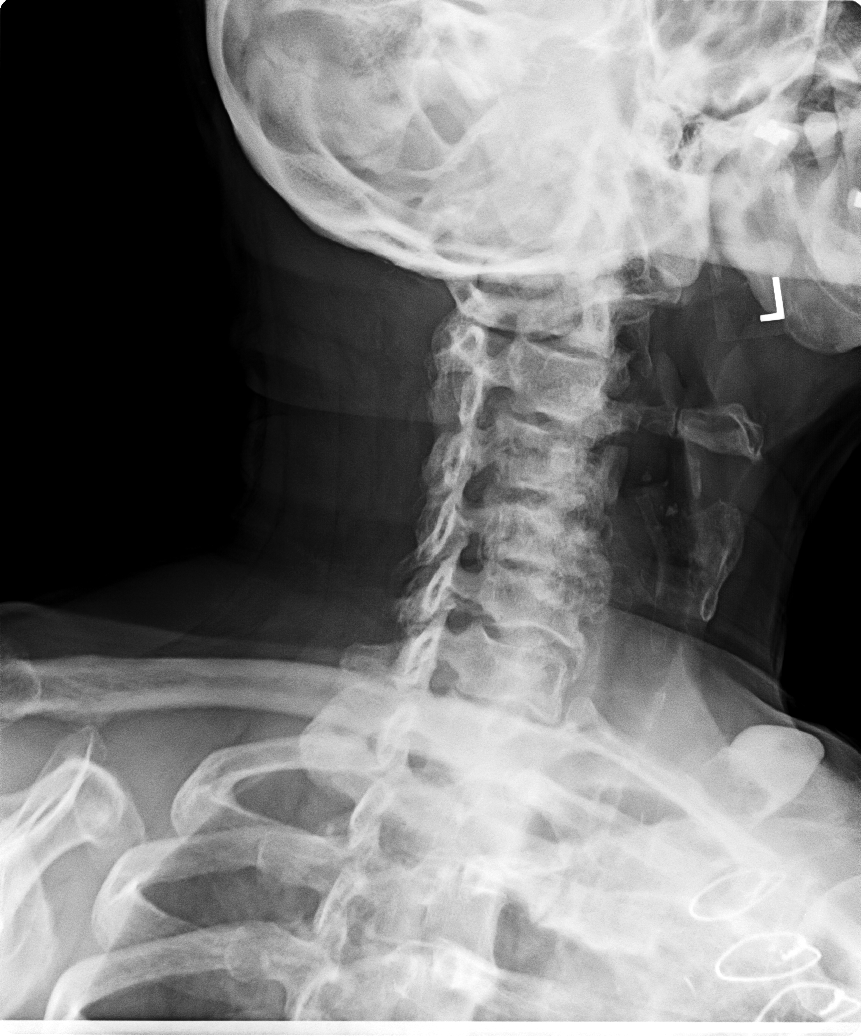

[view not recorded (3 of 6)]
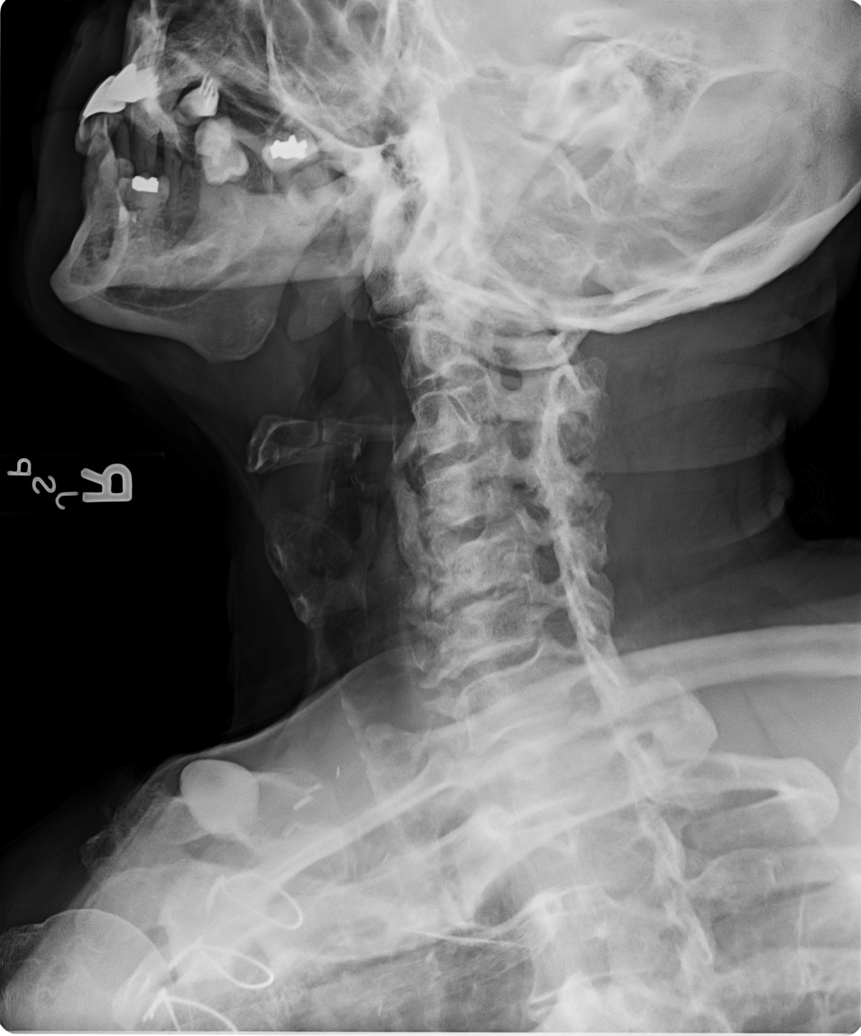

[view not recorded (4 of 6)]
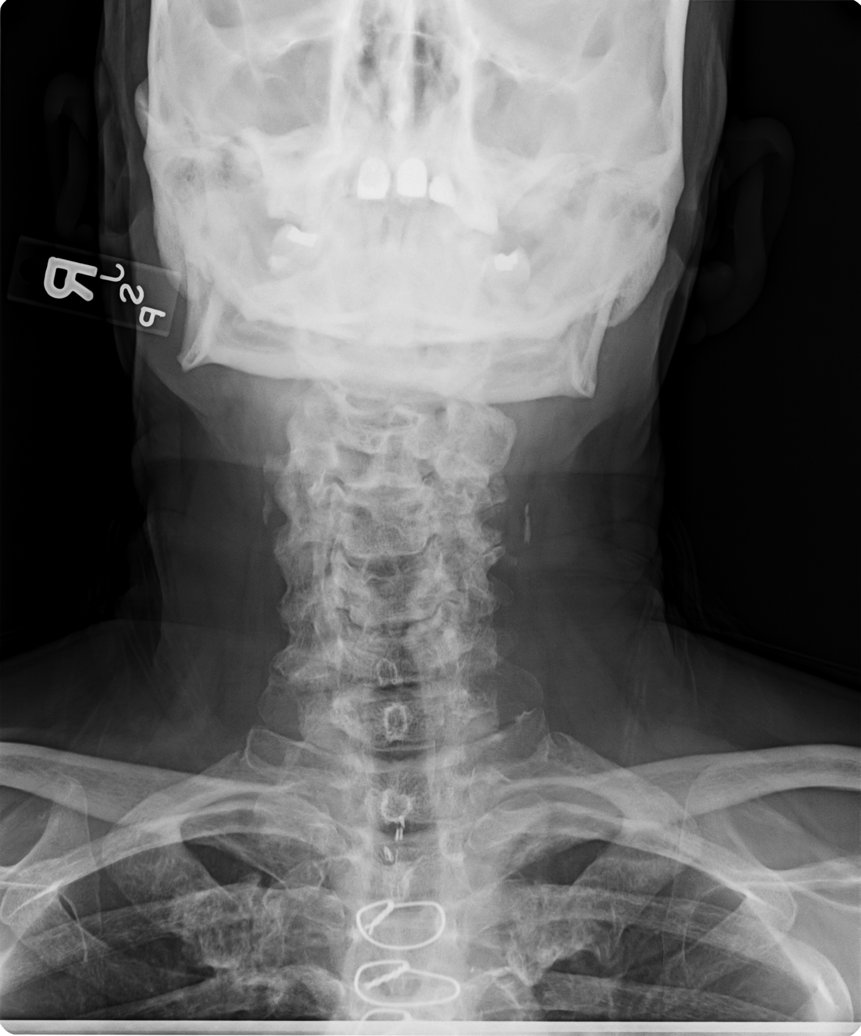

[view not recorded (5 of 6)]
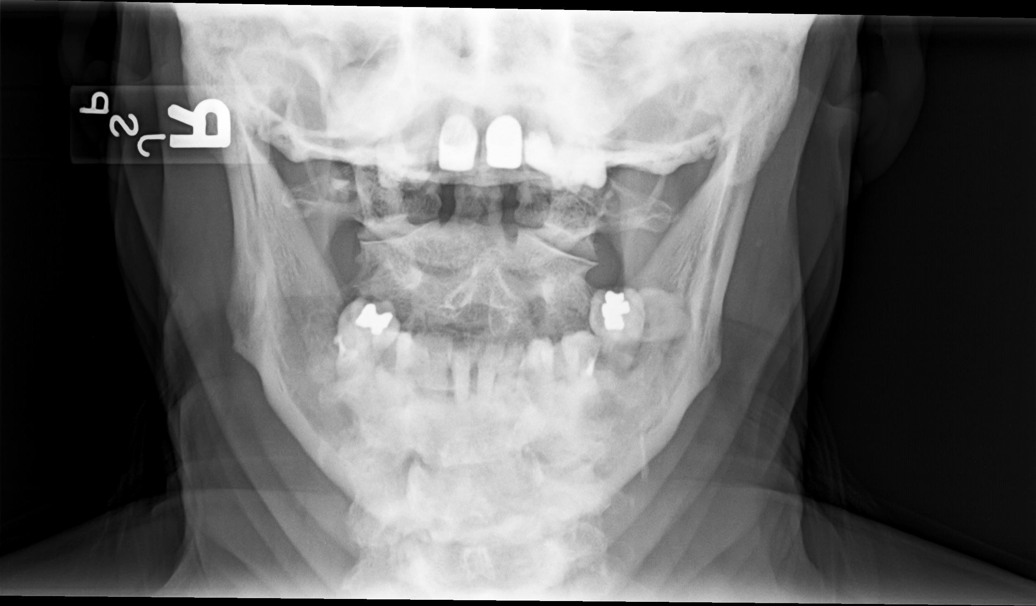

[view not recorded (6 of 6)]
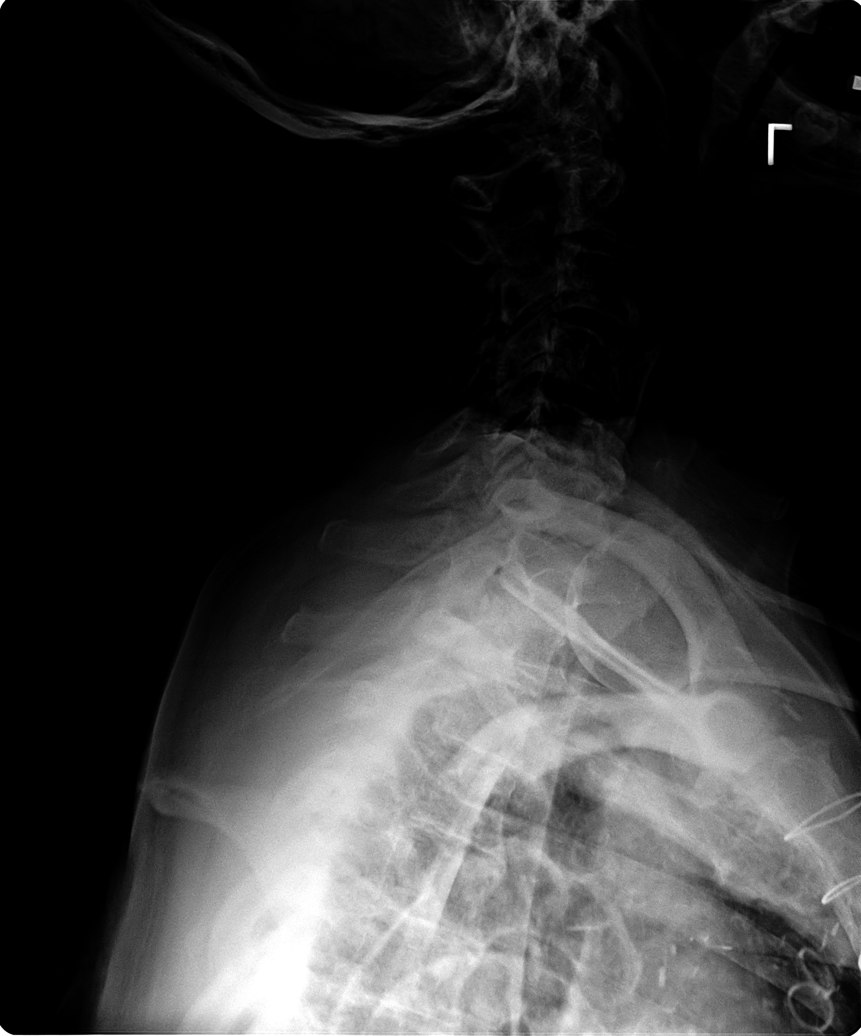

[6 of 6 positions shown; findings below may reference images not displayed]

FINDINGS: Six views of cervical spine submitted.  No acute fracture
or subluxation.  There is disc space flattening with mild anterior
spurring at C5-C6 and C6-C7 level.  Large anterior bridging
osteophytes noted at C4-C5 level.  Large anterior osteophytes and
calcifications of the anterior longitudinal ligament noted at C3-C4
level. Mild narrowing of the right neural foramina at C3 level.
Mild narrowing of the left neural foramina at C4 level.

C1-C2 relationship is unremarkable.  No prevertebral soft tissue
swelling.  Cervical airway is patent.
IMPRESSION: No acute fracture or subluxation.  Degenerative changes as
described above.

## 2014-10-10 ENCOUNTER — Ambulatory Visit: Payer: Commercial Managed Care - HMO | Admitting: Family Medicine

## 2014-10-17 ENCOUNTER — Other Ambulatory Visit: Payer: Self-pay | Admitting: Family Medicine

## 2014-10-21 ENCOUNTER — Encounter: Payer: Self-pay | Admitting: Family Medicine

## 2014-10-21 ENCOUNTER — Ambulatory Visit (INDEPENDENT_AMBULATORY_CARE_PROVIDER_SITE_OTHER): Payer: Commercial Managed Care - HMO | Admitting: Family Medicine

## 2014-10-21 VITALS — BP 143/74 | HR 74 | Temp 97.7°F | Ht 68.0 in | Wt 155.0 lb

## 2014-10-21 DIAGNOSIS — E1140 Type 2 diabetes mellitus with diabetic neuropathy, unspecified: Secondary | ICD-10-CM

## 2014-10-21 DIAGNOSIS — E785 Hyperlipidemia, unspecified: Secondary | ICD-10-CM

## 2014-10-21 DIAGNOSIS — N400 Enlarged prostate without lower urinary tract symptoms: Secondary | ICD-10-CM

## 2014-10-21 DIAGNOSIS — R1031 Right lower quadrant pain: Secondary | ICD-10-CM

## 2014-10-21 DIAGNOSIS — E1165 Type 2 diabetes mellitus with hyperglycemia: Secondary | ICD-10-CM

## 2014-10-21 DIAGNOSIS — N183 Chronic kidney disease, stage 3 unspecified: Secondary | ICD-10-CM

## 2014-10-21 DIAGNOSIS — E559 Vitamin D deficiency, unspecified: Secondary | ICD-10-CM

## 2014-10-21 DIAGNOSIS — E039 Hypothyroidism, unspecified: Secondary | ICD-10-CM | POA: Diagnosis not present

## 2014-10-21 DIAGNOSIS — IMO0002 Reserved for concepts with insufficient information to code with codable children: Secondary | ICD-10-CM

## 2014-10-21 DIAGNOSIS — R0989 Other specified symptoms and signs involving the circulatory and respiratory systems: Secondary | ICD-10-CM

## 2014-10-21 DIAGNOSIS — G5602 Carpal tunnel syndrome, left upper limb: Secondary | ICD-10-CM

## 2014-10-21 DIAGNOSIS — L819 Disorder of pigmentation, unspecified: Secondary | ICD-10-CM

## 2014-10-21 DIAGNOSIS — R1030 Lower abdominal pain, unspecified: Secondary | ICD-10-CM | POA: Diagnosis not present

## 2014-10-21 LAB — POCT CBC
GRANULOCYTE PERCENT: 61.7 % (ref 37–80)
HEMATOCRIT: 42.7 % — AB (ref 43.5–53.7)
Hemoglobin: 13.1 g/dL — AB (ref 14.1–18.1)
Lymph, poc: 2 (ref 0.6–3.4)
MCH: 26.9 pg — AB (ref 27–31.2)
MCHC: 30.6 g/dL — AB (ref 31.8–35.4)
MCV: 87.9 fL (ref 80–97)
MPV: 8.5 fL (ref 0–99.8)
POC Granulocyte: 4.3 (ref 2–6.9)
POC LYMPH PERCENT: 28.5 %L (ref 10–50)
Platelet Count, POC: 199 10*3/uL (ref 142–424)
RBC: 4.86 M/uL (ref 4.69–6.13)
RDW, POC: 14.4 %
WBC: 6.9 10*3/uL (ref 4.6–10.2)

## 2014-10-21 LAB — POCT GLYCOSYLATED HEMOGLOBIN (HGB A1C): Hemoglobin A1C: 5.8

## 2014-10-21 NOTE — Progress Notes (Signed)
Subjective:    Patient ID: Christopher Padilla, male    DOB: 06-08-1940, 74 y.o.   MRN: [id number]  HPI Pt here for follow up and management of chronic medical problems which includes hypothyroid, hyperlipidemia and diabetes. He is taking medications regularly. Today the patient complains of some right rolling soreness. He remains diabetic but he isn't amputee bilaterally. Traditional lab work today and will be given an FOBT to return. This patient is well-known to me for many years. He comes in today for his routine follow-up and is complaining of growing so soreness on the right without known injury. He continues to be adjusting his prostheses for his lower extremities. He denies chest pain shortness of breath trouble swallowing and his heartburn is controlled by taking a PPI. He's having no trouble passing his water. He has had no blood in his stool. He sees the cardiologist periodically. He recently had his pacemaker adjusted.      Patient Active Problem List   Diagnosis Date Noted  . Biventricular cardiac pacemaker in situ 08/22/2014  . Osteopenia 07/25/2014  . CKD stage 3 due to type 2 diabetes mellitus   . Cardiomyopathy, ischemic   . Coronary artery disease involving native coronary artery of native heart without angina pectoris   . Complete heart block 05/14/2014  . Syncope 04/18/2014  . Accelerated hypertension 04/18/2014  . Diabetes mellitus type 2, uncontrolled 04/18/2014  . Hx of BKA, bilateral 05/03/2013  . Mitral regurgitation 07/03/2011  . Hypothyroidism 11/05/2008  . 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 Hyperlipidemia LDL goal <70 11/05/2008  . CEREBROVASCULAR DISEASE 11/05/2008  . TOBACCO ABUSE, HX OF 11/05/2008  . Coronary atherosclerosis 10/04/2008  . HEART FAILURE, COMBINED UNSPEC. 10/04/2008   Outpatient Encounter Prescriptions as of 10/21/2014  Medication Sig  . ACCU-CHEK SOFTCLIX LANCETS lancets Check BG 4Xdaily DX  E11.65  . aspirin 81 MG tablet Take 81 mg by mouth daily.    . BD INSULIN SYRINGE ULTRAFINE 31G X 15/64" 0.5 ML MISC USE AS DIRECTED  . Blood Glucose Monitoring Suppl (ACCU-CHEK AVIVA PLUS) W/DEVICE KIT Check BG 4XDaily DX E11.65  . carvedilol (COREG) 3.125 MG tablet TAKE 1 TABLET (3.125 MG TOTAL) BY MOUTH 2 (TWO) TIMES DAILY WITH A MEAL.  Marland Kitchen Cholecalciferol (VITAMIN D PO) Take 1,000 Units by mouth daily.   Marland Kitchen gabapentin (NEURONTIN) 600 MG tablet TAKE 1 CAPSULE BY MOUTH THREE TIMES A DAY (Patient taking differently: Take 600 mg by mouth 2 (two) times daily. )  . glucose blood (ACCU-CHEK AVIVA PLUS) test strip Check BG 4xdaily DX Ell.65  . insulin glargine (LANTUS) 100 UNIT/ML injection Inject 0.35 mLs (35 Units total) into the skin at bedtime. (Patient taking differently: Inject 30 Units into the skin at bedtime. )  . insulin lispro (HUMALOG) 100 UNIT/ML injection INJECT 0-20 UNITS INTO THE SKIN AS DIRECTED. SLIDING SCALE  . levothyroxine (SYNTHROID, LEVOTHROID) 125 MCG tablet TAKE 1 TABLET BY MOUTH DAILY  . nortriptyline (PAMELOR) 25 MG capsule TAKE 1 CAPSULE BY MOUTH AT BEDTIME  . omeprazole (PRILOSEC) 20 MG capsule TAKE 1 CAPSULE BY MOUTH DAILY  . [DISCONTINUED] Menthol-Methyl Salicylate (MUSCLE RUB) 10-15 % CREA Apply 1 application topically daily as needed for muscle pain.  . prednisoLONE acetate (PRED FORTE) 1 % ophthalmic suspension    No facility-administered encounter medications on file as of 10/21/2014.     Review of Systems  Constitutional: Negative.   HENT: Negative.   Eyes: Negative.   Respiratory: Negative.  Cardiovascular: Negative.   Gastrointestinal: Negative.   Endocrine: Negative.   Genitourinary: Negative.   Musculoskeletal: Positive for myalgias (right groin soreness).  Skin: Negative.   Allergic/Immunologic: Negative.   Neurological: Negative.   Hematological: Negative.   Psychiatric/Behavioral: Negative.        Objective:   Physical Exam  Constitutional:  He is oriented to person, place, and time. He appears well-nourished. No distress.  Pleasant bilateral below the knee amputee who is alert.  HENT:  Head: Normocephalic and atraumatic.  Right Ear: External ear normal.  Left Ear: External ear normal.  Nose: Nose normal.  Mouth/Throat: Oropharynx is clear and moist. No oropharyngeal exudate.  Eyes: Conjunctivae and EOM are normal. Pupils are equal, round, and reactive to light. Right eye exhibits no discharge. Left eye exhibits no discharge. No scleral icterus.  Neck: Normal range of motion. Neck supple. No thyromegaly present.  The patient has bilateral supraclavicular bruits with diminished radial pulses.  Cardiovascular: Normal rate, regular rhythm and normal heart sounds.   No murmur heard. The heart is regular at 72/m  Pulmonary/Chest: Effort normal and breath sounds normal. No respiratory distress. He has no wheezes. He has no rales. He exhibits no tenderness.  Clear anteriorly and posteriorly  Abdominal: Soft. Bowel sounds are normal. He exhibits no mass. There is no tenderness. There is no rebound and no guarding.  The abdomen had a lot of gas but no tenderness masses or organ enlargement  Musculoskeletal: He exhibits no edema or tenderness.  Is tenderness of the right medial thigh over the ligament to the [place]. He has good movement but tender to palpation. He should has good range of motion of both lower extremities with below the knee prostheses bilaterally. He has thenar atrophy on the left with a history of carpal tunnel repair on the right  Lymphadenopathy:    He has no cervical adenopathy.  Neurological: He is alert and oriented to person, place, and time.  Skin: Skin is warm and dry. No rash noted. No erythema. No pallor.  There is an abnormal appearing skin lesion on the right ear which the patient was I'm aware of.  Psychiatric: He has a normal mood and affect. His behavior is normal. Judgment and thought content normal.    Nursing note and vitals reviewed.  BP 143/74 mmHg  Pulse 74  Temp(Src) 97.7 F (36.5 C) (Oral)  Ht 5' 8"  (1.727 m)  Wt 155 lb (70.308 kg)  BMI 23.57 kg/m2        Assessment & Plan:  1. Type 2 diabetes, uncontrolled, with neuropathy -The patient will get an A1c today and he indicates he has not been keeping his blood sugar checked as closely as possible and not watching his diet as closely as possible and not getting as much exercise as possible. We will not make any changes in his treatment at this time. - POCT CBC - BMP8+EGFR - POCT glycosylated hemoglobin (Hb A1C)  2. BPH (benign prostatic hyperplasia) -He is having no problems passing his water today. - POCT CBC  3. Hyperlipidemia LDL goal <70 -He will continue with aggressive therapeutic lifestyle changes as much as possible. - POCT CBC - BMP8+EGFR - Lipid panel - Hepatic function panel  4. Vitamin D deficiency -Continue with vitamin D as doing until lab work is returned - POCT CBC - Vit D  25 hydroxy (rtn osteoporosis monitoring)  5. Chronic kidney disease, stage 3 (moderate) -Continue with good blood pressure control which is slightly elevated on  the systolic side today. - POCT CBC - BMP8+EGFR  6. Hypothyroidism, unspecified hypothyroidism type -No change in treatment pending results of lab work - Thyroid Panel With TSH  7. Right groin pain -Use warm wet compresses range of motion exercises  8. Atypical  skin lesion -Referred to dermatology for biopsy of lesion on ear   9. Decreased radial pulse -Radial pulses were diminished bilaterally the patient complains of discomfort in both arms with activity  10. Thenar atrophy, left -Probable carpal tunnel syndrome with thenar atrophy on the left  Meds ordered this encounter  Medications  . prednisoLONE acetate (PRED FORTE) 1 % ophthalmic suspension    Sig:    Patient Instructions                       Medicare Annual Wellness Visit  [facility] and  the medical providers at [facility] strive to bring you the best medical care.  In doing so we not only want to address your current medical conditions and concerns but also to detect new conditions early and prevent illness, disease and health-related problems.    Medicare offers a yearly Wellness Visit which allows our clinical staff to assess your need for preventative services including immunizations, lifestyle education, counseling to decrease risk of preventable diseases and screening for fall risk and other medical concerns.    This visit is provided free of charge (no copay) for all Medicare recipients. The clinical pharmacists at [facility] have begun to conduct these Wellness Visits which will also include a thorough review of all your medications.    As you primary medical provider recommend that you make an appointment for your Annual Wellness Visit if you have not done so already this year.  You may set up this appointment before you leave today or you may call back ([phone]) and schedule an appointment.  Please make sure when you call that you mention that you are scheduling your Annual Wellness Visit with the clinical pharmacist so that the appointment may be made for the proper length of time.      Continue current medications. Continue good therapeutic lifestyle changes which include good diet and exercise. Fall precautions discussed with patient. If an FOBT was given today- please return it to our front desk. If you are over 25 years old - you may need Prevnar 22 or the adult Pneumonia vaccine.  Flu Shots are still available at our office. If you still haven't had one please call to set up a nurse visit to get one.   After your visit with [place] today you will receive a survey in the mail or online from [employer] regarding your care with [place]. Please take a moment to fill this out. Your feedback is very important to [place] as you can help  [place] better understand your patient needs as well as improve your experience and satisfaction. WE CARE ABOUT YOU!!!   Follow-up with vascular surgeon as planned because of discomfort using both hands and diminished circulation coupled with bilateral supraclavicular bruits Exercise as much as possible Use warm wet compresses to right medial groin area with range of motion exercises Keep blood sugar under as good a control as possible and monitor regularly   Arrie Senate MD

## 2014-10-21 NOTE — Patient Instructions (Addendum)
Medicare Annual Wellness Visit  [place] and the medical providers at [facility] strive to bring you the best medical care.  In doing so we not only want to address your current medical conditions and concerns but also to detect new conditions early and prevent illness, disease and health-related problems.    Medicare offers a yearly Wellness Visit which allows our clinical staff to assess your need for preventative services including immunizations, lifestyle education, counseling to decrease risk of preventable diseases and screening for fall risk and other medical concerns.    This visit is provided free of charge (no copay) for all Medicare recipients. The clinical pharmacists at [facility] have begun to conduct these Wellness Visits which will also include a thorough review of all your medications.    As you primary medical provider recommend that you make an appointment for your Annual Wellness Visit if you have not done so already this year.  You may set up this appointment before you leave today or you may call back ([phone]) and schedule an appointment.  Please make sure when you call that you mention that you are scheduling your Annual Wellness Visit with the clinical pharmacist so that the appointment may be made for the proper length of time.      Continue current medications. Continue good therapeutic lifestyle changes which include good diet and exercise. Fall precautions discussed with patient. If an FOBT was given today- please return it to our front desk. If you are over 6 years old - you may need Prevnar 13 or the adult Pneumonia vaccine.  Flu Shots are still available at our office. If you still haven't had one please call to set up a nurse visit to get one.   After your visit with [place] today you will receive a survey in the mail or online from [employer] regarding your care with [place]. Please take a moment to  fill this out. Your feedback is very important to [place] as you can help [place] better understand your patient needs as well as improve your experience and satisfaction. WE CARE ABOUT YOU!!!   Follow-up with vascular surgeon as planned because of discomfort using both hands and diminished circulation coupled with bilateral supraclavicular bruits Exercise as much as possible Use warm wet compresses to right medial groin area with range of motion exercises Keep blood sugar under as good a control as possible and monitor regularly

## 2014-10-22 LAB — LIPID PANEL
Chol/HDL Ratio: 2.5 ratio units (ref 0.0–5.0)
Cholesterol, Total: 96 mg/dL — ABNORMAL LOW (ref 100–199)
HDL: 38 mg/dL — AB (ref >39)
LDL CALC: 41 mg/dL (ref 0–99)
TRIGLYCERIDES: 83 mg/dL (ref 0–149)
VLDL Cholesterol Cal: 17 mg/dL (ref 5–40)

## 2014-10-22 LAB — THYROID PANEL WITH TSH
Free Thyroxine Index: 2.3 (ref 1.2–4.9)
T3 Uptake Ratio: 32 % (ref 24–39)
T4 TOTAL: 7.3 ug/dL (ref 4.5–12.0)
TSH: 0.232 u[IU]/mL — ABNORMAL LOW (ref 0.450–4.500)

## 2014-10-22 LAB — BMP8+EGFR
BUN/Creatinine Ratio: 17 (ref 10–22)
BUN: 23 mg/dL (ref 8–27)
CALCIUM: 9.1 mg/dL (ref 8.6–10.2)
CO2: 26 mmol/L (ref 18–29)
Chloride: 96 mmol/L — ABNORMAL LOW (ref 97–108)
Creatinine, Ser: 1.36 mg/dL — ABNORMAL HIGH (ref 0.76–1.27)
GFR calc Af Amer: 58 mL/min/{1.73_m2} — ABNORMAL LOW (ref >59)
GFR calc non Af Amer: 51 mL/min/{1.73_m2} — ABNORMAL LOW (ref >59)
GLUCOSE: 321 mg/dL — AB (ref 65–99)
POTASSIUM: 4.9 mmol/L (ref 3.5–5.2)
SODIUM: 136 mmol/L (ref 134–144)

## 2014-10-22 LAB — VITAMIN D 25 HYDROXY (VIT D DEFICIENCY, FRACTURES): VIT D 25 HYDROXY: 43.1 ng/mL (ref 30.0–100.0)

## 2014-10-22 LAB — HEPATIC FUNCTION PANEL
ALBUMIN: 3.6 g/dL (ref 3.5–4.8)
ALT: 17 IU/L (ref 0–44)
AST: 15 IU/L (ref 0–40)
Alkaline Phosphatase: 145 IU/L — ABNORMAL HIGH (ref 39–117)
BILIRUBIN TOTAL: 0.6 mg/dL (ref 0.0–1.2)
Bilirubin, Direct: 0.21 mg/dL (ref 0.00–0.40)
Total Protein: 6.8 g/dL (ref 6.0–8.5)

## 2014-11-08 ENCOUNTER — Other Ambulatory Visit: Payer: Self-pay | Admitting: Family Medicine

## 2014-11-21 ENCOUNTER — Telehealth: Payer: Self-pay | Admitting: Internal Medicine

## 2014-11-21 ENCOUNTER — Ambulatory Visit (INDEPENDENT_AMBULATORY_CARE_PROVIDER_SITE_OTHER): Payer: Commercial Managed Care - HMO | Admitting: *Deleted

## 2014-11-21 ENCOUNTER — Encounter: Payer: Self-pay | Admitting: Internal Medicine

## 2014-11-21 DIAGNOSIS — I442 Atrioventricular block, complete: Secondary | ICD-10-CM | POA: Diagnosis not present

## 2014-11-21 NOTE — Telephone Encounter (Signed)
Informed pt that his transmission was received. Pt verbalized understanding.  

## 2014-11-21 NOTE — Progress Notes (Signed)
Remote pacemaker transmission.   

## 2014-11-21 NOTE — Telephone Encounter (Signed)
NEw Message  Pt wants to speak w/ Device on how to send remote transmission.Please call back and discuss.

## 2014-11-22 LAB — CUP PACEART REMOTE DEVICE CHECK
Battery Remaining Longevity: 86 mo
Brady Statistic AS VP Percent: 76 %
Brady Statistic AS VS Percent: 7.2 %
Date Time Interrogation Session: 20160718074640
Lead Channel Impedance Value: 430 Ohm
Lead Channel Pacing Threshold Amplitude: 0.75 V
Lead Channel Pacing Threshold Amplitude: 1 V
Lead Channel Pacing Threshold Pulse Width: 0.4 ms
Lead Channel Pacing Threshold Pulse Width: 0.5 ms
Lead Channel Sensing Intrinsic Amplitude: 12 mV
Lead Channel Sensing Intrinsic Amplitude: 5 mV
Lead Channel Setting Pacing Amplitude: 2 V
Lead Channel Setting Pacing Amplitude: 2 V
Lead Channel Setting Pacing Amplitude: 2.5 V
Lead Channel Setting Pacing Pulse Width: 0.4 ms
Lead Channel Setting Pacing Pulse Width: 0.5 ms
Lead Channel Setting Sensing Sensitivity: 4 mV
MDC IDC MSMT BATTERY REMAINING PERCENTAGE: 95.5 %
MDC IDC MSMT BATTERY VOLTAGE: 2.99 V
MDC IDC MSMT LEADCHNL LV IMPEDANCE VALUE: 340 Ohm
MDC IDC MSMT LEADCHNL LV PACING THRESHOLD AMPLITUDE: 1.5 V
MDC IDC MSMT LEADCHNL RA IMPEDANCE VALUE: 400 Ohm
MDC IDC MSMT LEADCHNL RA PACING THRESHOLD PULSEWIDTH: 0.4 ms
MDC IDC PG SERIAL: 7714017
MDC IDC STAT BRADY AP VP PERCENT: 11 %
MDC IDC STAT BRADY AP VS PERCENT: 1 %
MDC IDC STAT BRADY RA PERCENT PACED: 5.3 %

## 2014-11-23 ENCOUNTER — Other Ambulatory Visit: Payer: Self-pay | Admitting: Family Medicine

## 2014-11-26 ENCOUNTER — Other Ambulatory Visit: Payer: Self-pay | Admitting: Family Medicine

## 2014-12-09 ENCOUNTER — Encounter: Payer: Self-pay | Admitting: Cardiology

## 2014-12-12 ENCOUNTER — Encounter: Payer: Self-pay | Admitting: Internal Medicine

## 2014-12-30 ENCOUNTER — Encounter: Payer: Self-pay | Admitting: Surgery

## 2015-01-02 ENCOUNTER — Encounter: Payer: Self-pay | Admitting: Surgery

## 2015-01-02 ENCOUNTER — Ambulatory Visit (INDEPENDENT_AMBULATORY_CARE_PROVIDER_SITE_OTHER): Payer: Commercial Managed Care - HMO | Admitting: Surgery

## 2015-01-02 ENCOUNTER — Encounter (HOSPITAL_COMMUNITY): Payer: Commercial Managed Care - HMO

## 2015-01-02 VITALS — BP 148/74 | HR 76 | Temp 98.0°F | Resp 14 | Ht 68.0 in | Wt 155.0 lb

## 2015-01-02 DIAGNOSIS — I6523 Occlusion and stenosis of bilateral carotid arteries: Secondary | ICD-10-CM | POA: Diagnosis not present

## 2015-01-02 DIAGNOSIS — I739 Peripheral vascular disease, unspecified: Secondary | ICD-10-CM

## 2015-01-02 NOTE — Progress Notes (Signed)
[facility] HISTORY AND PHYSICAL   History of Present Illness:  Patient is a 74 y.o. year old male who presents for evaluation of bilateral carotid stenosis that has been followed in the past by Dr. Redge Gainer who sent him back to [place] for follow up visit.  He has no new complaints of weakness in UE or LE, no problems with speech or vision changes.  He also has a past medical history of PAD in bilateral LE s/p  Right and Left femoral to above-knee popliteal artery bypasswith 6 mm PTFE graft.   that failed and both failed over time and ended BKA by Dr. Sharol Given.  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  Past Medical History  Diagnosis Date  . CAD (coronary artery disease)     S/P CABG, Feb 2003, LIMA to the LAD, left free radial artery to an obtuse marginal, spahenous vein graft to diagonal, saphenous vein graft to RCA with endarterectomy  . S/P below knee amputation   . Cerebrovascular disease     MRA in 2005 with 75% stenosis in distal right vertebral artery and 75% stenosis greater in distal cervical internal carotid artery on teh right, severe bilateral disease and MRA of the extracranial circulation, high grade stenosis of the distal right internal carotid artery at the junction of the cervical internal carotid artery of the skull base  . Diabetes mellitus   . Hyperlipidemia   .  Hypothyroidism   . History of tobacco use     Quit in 1990  . Myocardial infarction 2002  . GERD (gastroesophageal reflux disease)   . Arthritis   . Cataract   . Complete heart block 05/14/2014    St. Jude BiV PM (serial number [id number]) pacemaker  . CKD stage 3 due to type 2 diabetes mellitus   . Osteopenia 2016    Past Surgical History  Procedure Laterality Date  . Leg amputation below knee      Bilateral  . Arterial bypass surgry      Left; left femoral to posterior tibial bypass gracting, left femoral artery and deep femoral artery endarterectomy with vein patch angioplasty of the common femoral arter and deep femoral artery 06/2005, right fremoral popliteal bypass, right iliac artery restent, bilateral below knee amputations  . Thoracotomy      With drainage of a hemathroax and decortication of fibrothorax  . Coronary artery bypass graft  2002    By Ivin Poot, MD  . Cataract extraction      Bilateral  . Carpal tunnel release      Right  . Amputation  10/17/2011    Procedure: AMPUTATION DIGIT;  Surgeon: Newt Minion, MD;  Location: [facility];  Service: Orthopedics;  Laterality: Left;  Amputation Left Index Finger at PIP Joint  . Finger surgery Right     pointer  . Left heart catheterization with coronary angiogram N/A 05/10/2014    Procedure: LEFT HEART CATHETERIZATION WITH CORONARY ANGIOGRAM;  Surgeon: Burnell Blanks, MD;  Location[facility] CATH LAB;  Service: Cardiovascular;  Laterality: N/A;  . Temporary pacemaker insertion N/A 05/14/2014    Procedure: TEMPORARY PACEMAKER INSERTION;  Surgeon: Blane Ohara, MD;  Location[facility] CATH LAB;  Service: Cardiovascular;  Laterality: N/A;  . Bi-ventricular pacemaker insertion N/A 05/16/2014    Procedure: BI-VENTRICULAR PACEMAKER INSERTION (CRT-P);  Surgeon: Evans Lance, MD; St. Jude BiV PM (serial number [id number]) pacemaker    Social History Social History  Substance Use Topics  . Smoking status: Former Smoker -- 1.00 packs/day     Types: Cigarettes    Start date: 11/04/1954    Quit date: 05/06/1988  . Smokeless tobacco: Never Used  . Alcohol Use: No    Family History Family History  Problem Relation Age of Onset  . Cancer Mother     brain and lung  . Diabetes Father 62  . Coronary artery disease Father   . Colon cancer Neg Hx   . Diabetes Sister   . Diabetes Brother   . Diabetes Son     Allergies  Allergies  Allergen Reactions  . Codeine     hallucinations  . Ciprofloxacin Diarrhea  . Nsaids Other (See Comments)    GI upset     Current Outpatient Prescriptions  Medication Sig Dispense Refill  . ACCU-CHEK SOFTCLIX LANCETS lancets Check BG 4Xdaily DX E11.65 300 each 1  . aspirin 81 MG tablet Take 81 mg by mouth daily.      . Blood Glucose Monitoring Suppl (ACCU-CHEK AVIVA PLUS) W/DEVICE KIT Check BG 4XDaily DX E11.65 1 kit 0  . carvedilol (COREG) 3.125 MG tablet TAKE 1 TABLET (3.125 MG TOTAL) BY MOUTH 2 (TWO) TIMES DAILY WITH A MEAL. 60 tablet 5  . Cholecalciferol (VITAMIN D PO) Take 1,000 Units by mouth daily.     Marland Kitchen gabapentin (NEURONTIN) 600 MG tablet TAKE 1 CAPSULE BY MOUTH THREE TIMES A DAY (Patient taking differently: Take 600 mg by mouth 2 (two) times daily. ) 270 tablet 2  . glucose blood (ACCU-CHEK AVIVA PLUS) test strip Check BG 4xdaily DX Ell.65 300 each 1  . insulin glargine (LANTUS) 100 UNIT/ML injection Inject 0.35 mLs (35 Units total) into the skin at bedtime. (Patient taking differently: Inject 30 Units into the skin at bedtime. ) 30 mL 2  . insulin lispro (HUMALOG) 100 UNIT/ML injection INJECT 0-20 UNITS INTO THE SKIN AS DIRECTED. SLIDING SCALE 10 mL 5  . Insulin Syringe-Needle U-100 (BD INSULIN SYRINGE ULTRAFINE) 31G X 15/64" 0.5 ML MISC Use once daily 100 each 1  . levothyroxine (SYNTHROID, LEVOTHROID) 125 MCG tablet TAKE 1 TABLET BY MOUTH DAILY 90 tablet 5  . nortriptyline (PAMELOR) 25 MG capsule TAKE 1 CAPSULE BY MOUTH AT BEDTIME 90 capsule 3  . omeprazole (PRILOSEC) 20 MG  capsule TAKE 1 CAPSULE BY MOUTH DAILY 90 capsule 3  . prednisoLONE acetate (PRED FORTE) 1 % ophthalmic suspension      No current facility-administered medications for this visit.    ROS:   General:  No weight loss, Fever, chills  HEENT: No recent headaches, no nasal bleeding, no visual changes, no sore throat  Neurologic: No dizziness, blackouts, seizures. No recent symptoms of stroke or mini- stroke. No recent episodes of slurred speech, or temporary blindness.  Cardiac: No recent episodes of chest pain/pressure, no shortness of breath at rest.  No shortness of breath with exertion.  Denies history of atrial fibrillation or irregular heartbeat  Vascular: No history of rest pain in feet.  No history of claudication.  Positive history of non-healing ulcer,  No history of DVT   Pulmonary: No home oxygen, no productive cough, no hemoptysis,  No asthma or wheezing  Musculoskeletal:  [ ]  Arthritis, [ ]  Low back pain,  [ ]  Joint pain  Hematologic:No history of hypercoagulable state.  No history of easy bleeding.  No history of anemia  Gastrointestinal: No hematochezia or melena,  No gastroesophageal reflux, no trouble swallowing  Urinary: [ ]  chronic Kidney disease, [ ]  on HD - [ ]  MWF or [ ]  TTHS, [ ]  Burning with urination, [ ]  Frequent urination, [ ]  Difficulty urinating;   Skin: No rashes  Psychological: No history of anxiety,  No history of depression   Physical Examination  Filed Vitals:   01/02/15 1311 01/02/15 1313 01/02/15 1314  BP: 150/82 144/76 148/74  Pulse: 72 74 76  Temp: 98 F (36.7 C)    TempSrc: Oral    Resp: 14    Height: 5' 8"  (1.727 m)    Weight: 155 lb (70.308 kg)    SpO2: 99%      Body mass index is 23.57 kg/(m^2).  General:  Alert and oriented, no acute distress HEENT: Normal Neck: right sided supraclavicular bruit Pulmonary: Clear to auscultation bilaterally Cardiac: Regular Rate and Rhythm without murmur Abdomen: Soft, non-tender,  non-distended, no mass, no scars Skin: No rash Extremity Pulses:  2+ radial right and non palpable on the left, brachial, femoral palpable pulses Musculoskeletal: No  edema  Neurologic: Upper and lower extremity motor 5/5 grossly on the right decreased on the left secondary to arterial harvest for cardiac surgeryand symmetric  DATA:  No studies were performed today    ASSESSMENT:   Carotid stenosis right 60-79% 04/2014 Left <50% 04/2014 PAD bilateral LE s/p BKA bilaterally after failed bypass  PLAN:   We will schedule him for carotid duplex re peat study and he will follow up with Dr. Scot Dock once the studies are read, or scheduled for f/u studies in the future.   COLLINS, EMMA MAUREEN PA-C [facility]  The patient was seen in conjunction with Dr. Trula Slade today.   I agree with the above.  I have seen and evaluated the patient.  There was a mix up in scheduling today.  The opinion was that he was here for evaluation of right groin pain, however in actuality he was here for a carotid bruit with a history of duplex imaging most recently performed in 2015.  The patient did not get a carotid duplex today.  I tried to have this arranged after his visit but he could not wait an hour for the study to be done.  Therefore he agreed to come back.  Because the patient has undergone lower sternum a bypass surgery in the past by Dr. Scot Dock, I will return him to see Dr. Scot Dock after his carotid duplex.  Annamarie Major

## 2015-01-03 NOTE — Addendum Note (Signed)
Addended by: Adria Dill L on: 01/03/2015 10:04 AM   Modules accepted: Orders

## 2015-01-16 ENCOUNTER — Encounter (HOSPITAL_COMMUNITY): Payer: Commercial Managed Care - HMO

## 2015-01-25 ENCOUNTER — Other Ambulatory Visit: Payer: Self-pay | Admitting: Family Medicine

## 2015-01-31 ENCOUNTER — Other Ambulatory Visit: Payer: Self-pay | Admitting: Family Medicine

## 2015-02-06 ENCOUNTER — Ambulatory Visit (HOSPITAL_COMMUNITY)
Admission: RE | Admit: 2015-02-06 | Discharge: 2015-02-06 | Disposition: A | Discharge status: Home / Self Care | Payer: Commercial Managed Care - HMO | Source: Ambulatory Visit | Attending: Surgery | Admitting: Surgery

## 2015-02-06 DIAGNOSIS — N183 Chronic kidney disease, stage 3 (moderate): Secondary | ICD-10-CM | POA: Diagnosis not present

## 2015-02-06 DIAGNOSIS — E1122 Type 2 diabetes mellitus with diabetic chronic kidney disease: Secondary | ICD-10-CM | POA: Diagnosis not present

## 2015-02-06 DIAGNOSIS — I739 Peripheral vascular disease, unspecified: Secondary | ICD-10-CM | POA: Insufficient documentation

## 2015-02-06 DIAGNOSIS — I6523 Occlusion and stenosis of bilateral carotid arteries: Secondary | ICD-10-CM | POA: Diagnosis not present

## 2015-02-06 DIAGNOSIS — Z87891 Personal history of nicotine dependence: Secondary | ICD-10-CM | POA: Insufficient documentation

## 2015-02-06 DIAGNOSIS — E785 Hyperlipidemia, unspecified: Secondary | ICD-10-CM | POA: Insufficient documentation

## 2015-02-09 ENCOUNTER — Other Ambulatory Visit: Payer: Self-pay | Admitting: Family Medicine

## 2015-02-20 ENCOUNTER — Ambulatory Visit (INDEPENDENT_AMBULATORY_CARE_PROVIDER_SITE_OTHER): Payer: Commercial Managed Care - HMO | Admitting: *Deleted

## 2015-02-20 DIAGNOSIS — I442 Atrioventricular block, complete: Secondary | ICD-10-CM

## 2015-02-20 LAB — CUP PACEART REMOTE DEVICE CHECK
Battery Remaining Longevity: 86 mo
Battery Remaining Percentage: 95.5 %
Brady Statistic AP VP Percent: 8.3 %
Brady Statistic RA Percent Paced: 4.2 %
Implantable Lead Implant Date: 20160111
Lead Channel Impedance Value: 400 Ohm
Lead Channel Sensing Intrinsic Amplitude: 12 mV
Lead Channel Setting Sensing Sensitivity: 4 mV
MDC IDC LEAD IMPLANT DT: 20160111
MDC IDC LEAD IMPLANT DT: 20160111
MDC IDC LEAD LOCATION: 753857
MDC IDC LEAD LOCATION: 753859
MDC IDC LEAD LOCATION: 753860
MDC IDC LEAD MODEL: 4196
MDC IDC MSMT BATTERY VOLTAGE: 2.98 V
MDC IDC MSMT LEADCHNL LV IMPEDANCE VALUE: 360 Ohm
MDC IDC MSMT LEADCHNL RA SENSING INTR AMPL: 5 mV
MDC IDC MSMT LEADCHNL RV IMPEDANCE VALUE: 410 Ohm
MDC IDC SESS DTM: 20161017062638
MDC IDC SET LEADCHNL LV PACING AMPLITUDE: 2.5 V
MDC IDC SET LEADCHNL LV PACING PULSEWIDTH: 0.5 ms
MDC IDC SET LEADCHNL RA PACING AMPLITUDE: 2 V
MDC IDC SET LEADCHNL RV PACING AMPLITUDE: 2 V
MDC IDC SET LEADCHNL RV PACING PULSEWIDTH: 0.4 ms
MDC IDC STAT BRADY AP VS PERCENT: 1 %
MDC IDC STAT BRADY AS VP PERCENT: 81 %
MDC IDC STAT BRADY AS VS PERCENT: 6.8 %
Pulse Gen Model: 3222
Pulse Gen Serial Number: 7714017

## 2015-02-20 NOTE — Progress Notes (Signed)
Remote pacemaker transmission.   

## 2015-02-21 ENCOUNTER — Encounter: Payer: Self-pay | Admitting: Family Medicine

## 2015-02-21 ENCOUNTER — Encounter: Payer: Self-pay | Admitting: Cardiology

## 2015-03-01 ENCOUNTER — Encounter: Payer: Self-pay | Admitting: Cardiology

## 2015-03-13 ENCOUNTER — Encounter: Payer: Self-pay | Admitting: Family Medicine

## 2015-03-13 ENCOUNTER — Telehealth: Payer: Self-pay | Admitting: Internal Medicine

## 2015-03-13 ENCOUNTER — Ambulatory Visit (INDEPENDENT_AMBULATORY_CARE_PROVIDER_SITE_OTHER): Payer: Commercial Managed Care - HMO | Admitting: Family Medicine

## 2015-03-13 VITALS — BP 154/82 | HR 68 | Temp 97.3°F | Ht 68.0 in | Wt 155.0 lb

## 2015-03-13 DIAGNOSIS — E785 Hyperlipidemia, unspecified: Secondary | ICD-10-CM | POA: Diagnosis not present

## 2015-03-13 DIAGNOSIS — E039 Hypothyroidism, unspecified: Secondary | ICD-10-CM

## 2015-03-13 DIAGNOSIS — N183 Chronic kidney disease, stage 3 unspecified: Secondary | ICD-10-CM

## 2015-03-13 DIAGNOSIS — Z794 Long term (current) use of insulin: Secondary | ICD-10-CM

## 2015-03-13 DIAGNOSIS — N400 Enlarged prostate without lower urinary tract symptoms: Secondary | ICD-10-CM | POA: Diagnosis not present

## 2015-03-13 DIAGNOSIS — E559 Vitamin D deficiency, unspecified: Secondary | ICD-10-CM

## 2015-03-13 DIAGNOSIS — IMO0002 Reserved for concepts with insufficient information to code with codable children: Secondary | ICD-10-CM

## 2015-03-13 DIAGNOSIS — I442 Atrioventricular block, complete: Secondary | ICD-10-CM | POA: Diagnosis not present

## 2015-03-13 DIAGNOSIS — Z89521 Acquired absence of right knee: Secondary | ICD-10-CM

## 2015-03-13 DIAGNOSIS — Z89511 Acquired absence of right leg below knee: Secondary | ICD-10-CM

## 2015-03-13 DIAGNOSIS — Z23 Encounter for immunization: Secondary | ICD-10-CM | POA: Diagnosis not present

## 2015-03-13 DIAGNOSIS — E1139 Type 2 diabetes mellitus with other diabetic ophthalmic complication: Secondary | ICD-10-CM

## 2015-03-13 DIAGNOSIS — Z950 Presence of cardiac pacemaker: Secondary | ICD-10-CM

## 2015-03-13 DIAGNOSIS — E1140 Type 2 diabetes mellitus with diabetic neuropathy, unspecified: Secondary | ICD-10-CM | POA: Diagnosis not present

## 2015-03-13 DIAGNOSIS — E1122 Type 2 diabetes mellitus with diabetic chronic kidney disease: Secondary | ICD-10-CM

## 2015-03-13 DIAGNOSIS — E1165 Type 2 diabetes mellitus with hyperglycemia: Secondary | ICD-10-CM

## 2015-03-13 DIAGNOSIS — IMO0001 Reserved for inherently not codable concepts without codable children: Secondary | ICD-10-CM

## 2015-03-13 LAB — POCT GLYCOSYLATED HEMOGLOBIN (HGB A1C): Hemoglobin A1C: 10.8

## 2015-03-13 NOTE — Patient Instructions (Addendum)
Medicare Annual Wellness Visit  [place] and the medical providers at [facility] strive to bring you the best medical care.  In doing so we not only want to address your current medical conditions and concerns but also to detect new conditions early and prevent illness, disease and health-related problems.    Medicare offers a yearly Wellness Visit which allows our clinical staff to assess your need for preventative services including immunizations, lifestyle education, counseling to decrease risk of preventable diseases and screening for fall risk and other medical concerns.    This visit is provided free of charge (no copay) for all Medicare recipients. The clinical pharmacists at [facility] have begun to conduct these Wellness Visits which will also include a thorough review of all your medications.    As you primary medical provider recommend that you make an appointment for your Annual Wellness Visit if you have not done so already this year.  You may set up this appointment before you leave today or you may call back ([phone]) and schedule an appointment.  Please make sure when you call that you mention that you are scheduling your Annual Wellness Visit with the clinical pharmacist so that the appointment may be made for the proper length of time.     Continue current medications. Continue good therapeutic lifestyle changes which include good diet and exercise. Fall precautions discussed with patient. If an FOBT was given today- please return it to our front desk. If you are over 87 years old - you may need Prevnar 13 or the adult Pneumonia vaccine.  **Flu shots are available--- please call and schedule a FLU-CLINIC appointment**  After your visit with [place] today you will receive a survey in the mail or online from [employer] regarding your care with [place]. Please take a moment to fill this out. Your feedback is very  important to [place] as you can help [place] better understand your patient needs as well as improve your experience and satisfaction. WE CARE ABOUT YOU!!!   The patient should follow-up with a cardiologist as planned We gave him a copy of the recent Doppler results for his review as he had not gotten results of this He should drink more water and stay more hydrated and this will also help his constipation We will give him an appointment with the clinical pharmacist to discuss his insulin options and hoping that she can help him through the county obtain his insulin at a lower cost as well as helping him with getting his blood sugar under better control.

## 2015-03-13 NOTE — Telephone Encounter (Signed)
New Message    Office calling stating that the last few times pt has been in the office his BP readings have been borderline high and Dr. Rudi Heap increase Coreg to 6.25 2x per day. Please call back and advise.

## 2015-03-13 NOTE — Telephone Encounter (Signed)
Spoke with Asher Muir and let her know okay to increase.  Dr Ladona Ridgel prefers patients PCP to handle BP issues

## 2015-03-13 NOTE — Progress Notes (Signed)
Subjective:    Patient ID: Christopher Padilla, male    DOB: [birth year], 74 y.o.   MRN: [id number]  HPI Pt here for follow up and management of chronic medical problems which includes hyperlipidemia and diabetes. He is taking medications regularly. The patient comes in today for his regular follow-up. He has no specific complaints. 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 He has a pacemaker and he is followed by the cardiologist regularly. He also has chronic kidney disease along with his diabetes. The patient denies chest pain or shortness of breath. He has no problems with heartburn indigestion and nausea vomiting diarrhea or blood in the stool. He does have constipation but admits to not drinking enough water and fluids. He is passing his water well without problems. He does have an ongoing problem with his stop and this time is with the left with a little blister that is there that he is working to heal up on his own. He is constantly adjusting his prosthetics to help him manage the stump issues.      Patient Active Problem List   Diagnosis Date Noted  . Biventricular cardiac pacemaker in situ 08/22/2014  . Osteopenia 07/25/2014  . CKD stage 3 due to type 2 diabetes mellitus ([place])   . Cardiomyopathy, ischemic   . Coronary artery disease involving native coronary artery of native heart without angina pectoris   . Complete heart block ([place]) 05/14/2014  . Syncope 04/18/2014  . Accelerated hypertension 04/18/2014  . Diabetes mellitus type 2, uncontrolled ([place]) 04/18/2014  . Hx of BKA, bilateral 05/03/2013  . Mitral regurgitation 07/03/2011  . Hypothyroidism 11/05/2008  . 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 Hyperlipidemia LDL goal <70 11/05/2008  . CEREBROVASCULAR DISEASE 11/05/2008  . TOBACCO ABUSE, HX OF 11/05/2008  . Coronary atherosclerosis 10/04/2008  . HEART FAILURE, COMBINED UNSPEC.  10/04/2008   Outpatient Encounter Prescriptions as of 03/13/2015  Medication Sig  . ACCU-CHEK SOFTCLIX LANCETS lancets Check BG 4Xdaily DX E11.65  . aspirin 81 MG tablet Take 81 mg by mouth daily.    . Blood Glucose Monitoring Suppl (ACCU-CHEK AVIVA PLUS) W/DEVICE KIT Check BG 4XDaily DX E11.65  . carvedilol (COREG) 3.125 MG tablet TAKE 1 TABLET (3.125 MG TOTAL) BY MOUTH 2 (TWO) TIMES DAILY WITH A MEAL.  Marland Kitchen Cholecalciferol (VITAMIN D PO) Take 1,000 Units by mouth daily.   Marland Kitchen gabapentin (NEURONTIN) 600 MG tablet TAKE 1 CAPSULE BY MOUTH THREE TIMES A DAY (Patient taking differently: Take 600 mg by mouth 2 (two) times daily. )  . glucose blood (ACCU-CHEK AVIVA PLUS) test strip Check BG 4xdaily DX Ell.65  . HUMALOG 100 UNIT/ML injection INJECT 0-20 UNITS INTO THE SKIN AS DIRECTED. SLIDING SCALE  . insulin glargine (LANTUS) 100 UNIT/ML injection Inject 0.35 mLs (35 Units total) into the skin at bedtime. (Patient taking differently: Inject 30 Units into the skin at bedtime. )  . Insulin Syringe-Needle U-100 (BD INSULIN SYRINGE ULTRAFINE) 31G X 15/64" 0.5 ML MISC Use once daily  . LANTUS 100 UNIT/ML injection INJECT 4O UNITS SUBCUTANEOUSLY AT BEDTIME  . levothyroxine (SYNTHROID, LEVOTHROID) 125 MCG tablet TAKE 1 TABLET BY MOUTH DAILY  . nortriptyline (PAMELOR) 25 MG capsule TAKE 1 CAPSULE BY MOUTH AT BEDTIME  . omeprazole (PRILOSEC) 20 MG capsule TAKE 1 CAPSULE BY MOUTH DAILY  . prednisoLONE acetate (PRED FORTE) 1 % ophthalmic suspension   . simvastatin (ZOCOR) 40 MG tablet TAKE 1 TABLET (40 MG TOTAL) BY  MOUTH AT BEDTIME.   No facility-administered encounter medications on file as of 03/13/2015.      Review of Systems  Constitutional: Negative.   HENT: Negative.   Eyes: Negative.   Respiratory: Negative.   Cardiovascular: Negative.   Gastrointestinal: Negative.   Endocrine: Negative.   Genitourinary: Negative.   Musculoskeletal: Negative.   Skin: Negative.   Allergic/Immunologic: Negative.     Neurological: Negative.   Hematological: Negative.   Psychiatric/Behavioral: Negative.        Objective:   Physical Exam  Constitutional: He is oriented to person, place, and time. He appears well-developed and well-nourished.  Small framed but pleasant and alert with bilateral below the knee amputations secondary to peripheral vascular insufficiency and diabetes  HENT:  Head: Normocephalic and atraumatic.  Right Ear: External ear normal.  Left Ear: External ear normal.  Nose: Nose normal.  Mouth/Throat: Oropharynx is clear and moist. No oropharyngeal exudate.  Eyes: Conjunctivae and EOM are normal. Pupils are equal, round, and reactive to light. Right eye exhibits no discharge. Left eye exhibits no discharge. No scleral icterus.  Neck: Normal range of motion. Neck supple. No thyromegaly present.  The patient has bilateral supraclavicular bruits and a right carotid bruit. There is no anterior cervical adenopathy present and no thyromegaly.  Cardiovascular: Normal rate, regular rhythm and normal heart sounds.   No murmur heard. Pulmonary/Chest: Effort normal and breath sounds normal. No respiratory distress. He has no wheezes. He has no rales. He exhibits no tenderness.  Slight tightness with deep inhalation bilaterally  Abdominal: Soft. Bowel sounds are normal. He exhibits no mass. There is no tenderness. There is no rebound and no guarding.  Increased gas and tympany  Musculoskeletal: Normal range of motion. He exhibits no edema or tenderness.  The patient has bilateral below the knee prostheses and does well with both of these as far as his ambulation is concerned  Lymphadenopathy:    He has no cervical adenopathy.  Neurological: He is alert and oriented to person, place, and time.  Skin: Skin is warm and dry. No rash noted.  Psychiatric: He has a normal mood and affect. His behavior is normal. Judgment and thought content normal.  Nursing note and vitals reviewed.   BP 154/82  mmHg  Pulse 68  Temp(Src) 97.3 F (36.3 C) (Oral)  Ht 5' 8"  (1.727 m)  Wt 155 lb (70.308 kg)  BMI 23.57 kg/m2       Assessment & Plan:  1. Type 2 diabetes, uncontrolled, with neuropathy ([place]) -Appointment with clinical pharmacist for better diabetic control because of elevated hemoglobin A1c to 10.8% -This patient has never been under good control with his diabetes and this is been going on for years. - POCT glycosylated hemoglobin (Hb A1C) - BMP8+EGFR - CBC with Differential/Platelet  2. BPH (benign prostatic hyperplasia) -We will plan to do this exam at the next visit - CBC with Differential/Platelet  3. Hyperlipidemia LDL goal <70 -Continue current treatment pending results of lab work - CBC with Differential/Platelet - Hepatic function panel - NMR, lipoprofile  4. Vitamin D deficiency -Continue current treatment pending results of lab work - CBC with Differential/Platelet - Vit D  25 hydroxy (rtn osteoporosis monitoring)  5. Chronic kidney disease, stage 3 (moderate) -Continue to watch sodium intake and keep blood pressure under good control and we will discuss with the cardiologist about increasing his carvedilol to possibly 1-1/2 tablets twice daily. - BMP8+EGFR - CBC with Differential/Platelet  6. Hypothyroidism, unspecified hypothyroidism type -Continue  current treatment pending results of lab work - CBC with Differential/Platelet  7. 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 Hx of BKA, right -Continue to monitor stops and make sure that infection is clear and don't progress  9. CKD stage 3 due to type 2 diabetes mellitus ([place]) -Continue good blood pressure control and sodium restriction  10. Complete heart block ([place]) -Continue follow-up with cardiology  11. Biventricular cardiac pacemaker in situ -Continue follow-up with cardiology  Patient Instructions                        Medicare Annual Wellness Visit  [facility] and the medical providers at [facility] strive to bring you the best medical care.  In doing so we not only want to address your current medical conditions and concerns but also to detect new conditions early and prevent illness, disease and health-related problems.    Medicare offers a yearly Wellness Visit which allows our clinical staff to assess your need for preventative services including immunizations, lifestyle education, counseling to decrease risk of preventable diseases and screening for fall risk and other medical concerns.    This visit is provided free of charge (no copay) for all Medicare recipients. The clinical pharmacists at [facility] have begun to conduct these Wellness Visits which will also include a thorough review of all your medications.    As you primary medical provider recommend that you make an appointment for your Annual Wellness Visit if you have not done so already this year.  You may set up this appointment before you leave today or you may call back ([phone]) and schedule an appointment.  Please make sure when you call that you mention that you are scheduling your Annual Wellness Visit with the clinical pharmacist so that the appointment may be made for the proper length of time.     Continue current medications. Continue good therapeutic lifestyle changes which include good diet and exercise. Fall precautions discussed with patient. If an FOBT was given today- please return it to our front desk. If you are over 76 years old - you may need Prevnar 53 or the adult Pneumonia vaccine.  **Flu shots are available--- please call and schedule a FLU-CLINIC appointment**  After your visit with [place] today you will receive a survey in the mail or online from [employer] regarding your care with [place]. Please take a moment to fill this out. Your feedback is very important to [place] as  you can help [place] better understand your patient needs as well as improve your experience and satisfaction. WE CARE ABOUT YOU!!!   The patient should follow-up with a cardiologist as planned We gave him a copy of the recent Doppler results for his review as he had not gotten results of this He should drink more water and stay more hydrated and this will also help his constipation We will give him an appointment with the clinical pharmacist to discuss his insulin options and hoping that she can help him through the county obtain his insulin at a lower cost as well as helping him with getting his blood sugar under better control.   Arrie Senate MD

## 2015-03-14 ENCOUNTER — Telehealth: Payer: Self-pay | Admitting: Family Medicine

## 2015-03-14 LAB — CBC WITH DIFFERENTIAL/PLATELET
BASOS: 1 %
Basophils Absolute: 0 10*3/uL (ref 0.0–0.2)
EOS (ABSOLUTE): 0.1 10*3/uL (ref 0.0–0.4)
Eos: 2 %
HEMOGLOBIN: 14.2 g/dL (ref 12.6–17.7)
Hematocrit: 43.2 % (ref 37.5–51.0)
IMMATURE GRANS (ABS): 0 10*3/uL (ref 0.0–0.1)
Immature Granulocytes: 0 %
LYMPHS ABS: 2 10*3/uL (ref 0.7–3.1)
LYMPHS: 33 %
MCH: 30.1 pg (ref 26.6–33.0)
MCHC: 32.9 g/dL (ref 31.5–35.7)
MCV: 92 fL (ref 79–97)
MONOCYTES: 9 %
Monocytes Absolute: 0.5 10*3/uL (ref 0.1–0.9)
NEUTROS ABS: 3.5 10*3/uL (ref 1.4–7.0)
Neutrophils: 55 %
Platelets: 186 10*3/uL (ref 150–379)
RBC: 4.72 x10E6/uL (ref 4.14–5.80)
RDW: 14.3 % (ref 12.3–15.4)
WBC: 6.1 10*3/uL (ref 3.4–10.8)

## 2015-03-14 LAB — BMP8+EGFR
BUN / CREAT RATIO: 11 (ref 10–22)
BUN: 15 mg/dL (ref 8–27)
CALCIUM: 8.8 mg/dL (ref 8.6–10.2)
CHLORIDE: 98 mmol/L (ref 97–106)
CO2: 26 mmol/L (ref 18–29)
Creatinine, Ser: 1.36 mg/dL — ABNORMAL HIGH (ref 0.76–1.27)
GFR, EST AFRICAN AMERICAN: 58 mL/min/{1.73_m2} — AB (ref >59)
GFR, EST NON AFRICAN AMERICAN: 50 mL/min/{1.73_m2} — AB (ref >59)
Glucose: 190 mg/dL — ABNORMAL HIGH (ref 65–99)
POTASSIUM: 4.8 mmol/L (ref 3.5–5.2)
Sodium: 138 mmol/L (ref 136–144)

## 2015-03-14 LAB — NMR, LIPOPROFILE
Cholesterol: 98 mg/dL — ABNORMAL LOW (ref 100–199)
HDL CHOLESTEROL BY NMR: 38 mg/dL — AB (ref >39)
HDL Particle Number: 24.7 umol/L — ABNORMAL LOW (ref >=30.5)
LDL PARTICLE NUMBER: 693 nmol/L (ref <1000)
LDL SIZE: 20.7 nm (ref >20.5)
LDL-C: 51 mg/dL (ref 0–99)
LP-IR Score: 52 — ABNORMAL HIGH (ref <=45)
Small LDL Particle Number: 423 nmol/L (ref <=527)
TRIGLYCERIDES BY NMR: 44 mg/dL (ref 0–149)

## 2015-03-14 LAB — HEPATIC FUNCTION PANEL
ALK PHOS: 154 IU/L — AB (ref 39–117)
ALT: 18 IU/L (ref 0–44)
AST: 21 IU/L (ref 0–40)
Albumin: 3.7 g/dL (ref 3.5–4.8)
BILIRUBIN, DIRECT: 0.2 mg/dL (ref 0.00–0.40)
Bilirubin Total: 0.5 mg/dL (ref 0.0–1.2)
Total Protein: 6.8 g/dL (ref 6.0–8.5)

## 2015-03-14 LAB — VITAMIN D 25 HYDROXY (VIT D DEFICIENCY, FRACTURES): VIT D 25 HYDROXY: 37.7 ng/mL (ref 30.0–100.0)

## 2015-03-14 MED ORDER — INSULIN DETEMIR 100 UNIT/ML ~~LOC~~ SOLN: 30.0000 [IU] | Freq: Every day | SUBCUTANEOUS | Status: DC

## 2015-03-14 MED ORDER — CARVEDILOL 3.125 MG PO TABS: 3.1250 mg | ORAL_TABLET | Freq: Three times a day (TID) | ORAL | Status: DC

## 2015-03-14 MED ORDER — INSULIN ASPART 100 UNIT/ML ~~LOC~~ SOLN: SUBCUTANEOUS | Status: DC

## 2015-03-14 NOTE — Telephone Encounter (Signed)
Printed and given to [facility]

## 2015-03-14 NOTE — Progress Notes (Signed)
Pt called aware that per - Dr Sharrell Ku - we cal increase carvedilol.

## 2015-03-14 NOTE — Addendum Note (Signed)
Addended by: Magdalene River on: 03/14/2015 03:37 PM   Modules accepted: Orders

## 2015-03-14 NOTE — Telephone Encounter (Signed)
Please do a 3 month prescriptions for these insulins to help this patient

## 2015-03-17 ENCOUNTER — Telehealth: Payer: Self-pay | Admitting: Family Medicine

## 2015-03-17 NOTE — Telephone Encounter (Signed)
Reviewed labs with pt. 

## 2015-03-20 ENCOUNTER — Ambulatory Visit (INDEPENDENT_AMBULATORY_CARE_PROVIDER_SITE_OTHER): Payer: Commercial Managed Care - HMO | Admitting: Pharmacist

## 2015-03-20 ENCOUNTER — Encounter: Payer: Self-pay | Admitting: Pharmacist

## 2015-03-20 VITALS — BP 138/72 | HR 68 | Ht 68.0 in | Wt 160.0 lb

## 2015-03-20 DIAGNOSIS — E1165 Type 2 diabetes mellitus with hyperglycemia: Secondary | ICD-10-CM

## 2015-03-20 DIAGNOSIS — N183 Chronic kidney disease, stage 3 unspecified: Secondary | ICD-10-CM

## 2015-03-20 DIAGNOSIS — IMO0002 Reserved for concepts with insufficient information to code with codable children: Secondary | ICD-10-CM

## 2015-03-20 DIAGNOSIS — I10 Essential (primary) hypertension: Secondary | ICD-10-CM

## 2015-03-20 DIAGNOSIS — E1140 Type 2 diabetes mellitus with diabetic neuropathy, unspecified: Secondary | ICD-10-CM | POA: Diagnosis not present

## 2015-03-20 NOTE — Patient Instructions (Signed)
Change Levemir to 30 units in the morning (start tomorrow morning)   1 serving of carbohydrate containing food 2 or 3 servings of carbohydrate containing foods 4 or more serving of carbohydrate containing foods  2 units of humalog insulin 4 units of humalog insulin 6 units of humalog insulin     Blood glucose  Amount of Humalog insulin  Less than 100 none  101 to 120 2 unit  121 to 140 3 units  141 to 160 4 units  161 to 180 5 units  181 to 200 6 units  201 or over 8 units

## 2015-03-20 NOTE — Progress Notes (Signed)
Subjective:    Christopher Padilla is a 74 y.o. male who presents for an reevaluation of Type 2 diabetes mellitus.  Current symptoms/problems include hyperglycemia, hypoglycemia - occurs about 2 times per month during night and occasional during day prior to lunch.  The patient was initially diagnosed with Type 2 diabetes mellitus for 35 year (at 74 yo).  Known diabetic complications: retinopathy, peripheral neuropathy, cardiovascular disease and amplutations - BKA  - bilaterally and pointer finger of left hand Cardiovascular risk factors: advanced age (older than 35 for men, 33 for women), diabetes mellitus, dyslipidemia, hypertension and male gender Current diabetic medications include Lantus 30 units at bedtime and Humalog per sliding scale.   Eye exam current (within one year): yes 05/02/2014 Weight trend: stable Prior visit with dietician: yes  Current diet: patient is usually skipping lunch or only eating nab / crackers.  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   Home blood sugar records: Patient did not bring in BG readings but he reports am BG ranges from 114 to 200.  He r  Any episodes of hypoglycemia? yes - occurs about 1-2 times per week - usually in the am.  Most recently has readings of 45 and 75  Is He on ACE inhibitor or angiotensin II receptor blocker?  No ramipril was discontinued around 04/2014 - possibly related to increase serum creatinine which has since decreased and is currently stable.   The following portions of the patient's history were reviewed and updated as appropriate: allergies, current medications, past family history, past medical history, past social history, past surgical history and problem list.   Objective:     BP 138/72 mmHg  Pulse 68  Ht  (1.727 m)  Wt 160 lb (72.576 kg)  BMI 24.33 kg/m2  Lab Review GLUCOSE (mg/dL)  Date Value  16/02/9603 190*  10/21/2014 321*  05/04/2014 230*   GLUCOSE, BLD (mg/dL)  Date Value  54/01/8118 264*  05/15/2014 231*  05/14/2014 233*   CO2 (mmol/L)  Date Value  03/13/2015 26  10/21/2014 26  05/17/2014 25   BUN (mg/dL)  Date Value  14/78/2956 15  10/21/2014 23  05/17/2014 15  05/15/2014 28*  05/14/2014 37*  05/04/2014 20   CREAT (mg/dL)  Date Value  21/30/8657 1.22   CREATININE, SER (mg/dL)  Date Value  84/69/6295 1.36*  10/21/2014 1.36*  05/17/2014 1.25      Assessment:    Diabetes Mellitus type II, under inadequate control.   HTN - slightly elevated Plan:    1.  Rx changes:   Change Lantus to 30 units each morning.   Recommend increase checking BG to tid - qid (prior to each meal and at bedtime)   Humalog with each meal per sliding scale - scale updated 2.  Education: Reviewed 'ABCs' of diabetes management (respective goals in parentheses):  A1C (<7), blood pressure (<130/80), and cholesterol (LDL <100). 3.  Compliance at present is estimated to be inadequate. Efforts to improve compliance (if necessary) will be directed at dietary modifications:  eat a more susbtantial lunch.  Patient given anouther Accu Check Aviva glucometer to have at work so he can check BG at noon.    Reviewed CHO counting and regular  blood sugar monitoring: 3 to 4  times daily.   4. Continue carvedilol .6875 (1.5 tablets of 3.125mg ) twice a day.  Will continue to evaluated need / ability to take ACEI or ARB.  Last urine microalbumin was negative ad patient has elevated serum Cr in past.  Currently SCr is stable. 5.  Follow up: 1 weeks  -  Patient is instructed to bring BG monitor.   Henrene Pastor, PharmD, CPP, CDE

## 2015-03-27 ENCOUNTER — Encounter: Payer: Self-pay | Admitting: Pharmacist

## 2015-03-27 ENCOUNTER — Ambulatory Visit (INDEPENDENT_AMBULATORY_CARE_PROVIDER_SITE_OTHER): Payer: Commercial Managed Care - HMO | Admitting: Pharmacist

## 2015-03-27 VITALS — BP 120/54 | HR 78 | Ht 68.0 in | Wt 160.0 lb

## 2015-03-27 DIAGNOSIS — E1140 Type 2 diabetes mellitus with diabetic neuropathy, unspecified: Secondary | ICD-10-CM

## 2015-03-27 DIAGNOSIS — Z0000 Encounter for general adult medical examination without abnormal findings: Secondary | ICD-10-CM

## 2015-03-27 DIAGNOSIS — E1165 Type 2 diabetes mellitus with hyperglycemia: Secondary | ICD-10-CM

## 2015-03-27 DIAGNOSIS — IMO0002 Reserved for concepts with insufficient information to code with codable children: Secondary | ICD-10-CM

## 2015-03-27 NOTE — Patient Instructions (Addendum)
  [name] , Thank you for taking time to come for your Medicare Wellness Visit. I appreciate your ongoing commitment to your health goals. Please review the following plan we discussed and let me know if I can assist you in the future.   These are the goals we discussed:  Continue to check blood glucose prior to meals Increase Levemir to 35 units once each morning for next 7 days.  If your morning blood glucose reading is still over 150  After 1 week then increase to 38 units of Levemir.  Continue current Humalog / Novolog sliding scale.   This is a list of the screening recommended for you and due dates:  Health Maintenance  Topic Date Due  . Pneumonia vaccines (2 of 2 - PPSV23) 05/03/2014  . Eye exam for diabetics  05/03/2015  . Urine Protein Check  06/03/2015  . Hemoglobin A1C  09/10/2015  . Flu Shot  12/05/2015  . Complete foot exam   03/12/2016  . DEXA scan (bone density measurement)  07/24/2016  . Tetanus Vaccine  01/04/2021  . Shingles Vaccine  Completed

## 2015-03-27 NOTE — Progress Notes (Signed)
Patient ID: Christopher Padilla, male   DOB: 01/12/1941, 74 y.o.   MRN: [id number]    Subjective:   Christopher Padilla is a 74 y.o. whtie male who presents for an Initial Medicare Annual Wellness Visit.  Christopher Padilla is married.  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   Patient also has diabetes and was seen to adjust insulin 1 weeks ago.  He was changed from Lantus to Levemir because he is in Medicare coverage gap and Christopher Padilla was only able to get Levemir for him.    Christopher Padilla readings = 275, 249, 248, 98, 221, 245, 217, 221 No hypoglycemia  Patient has been checking BG more frequently  Current Medications (verified) Outpatient Encounter Prescriptions as of 03/27/2015  Medication Sig  . ACCU-CHEK SOFTCLIX LANCETS lancets Check BG 4Xdaily DX E11.65  . aspirin 81 MG tablet Take 81 mg by mouth daily.    . Blood Glucose Monitoring Suppl (ACCU-CHEK AVIVA PLUS) W/DEVICE KIT Check BG 4XDaily DX E11.65  . carvedilol (COREG) 3.125 MG tablet Take 1.5 tablets (4.6875 mg total) by mouth 2 (two) times daily with a meal. As directed  . Cholecalciferol (VITAMIN D PO) Take 1,000 Units by mouth daily.   Marland Kitchen gabapentin (NEURONTIN) 600 MG tablet TAKE 1 CAPSULE BY MOUTH THREE TIMES A DAY (Patient taking differently: Take 600 mg by mouth 2 (two) times daily. )  . glucose blood (ACCU-CHEK AVIVA PLUS) test strip Check BG 4xdaily DX Ell.65  . HUMALOG 100 UNIT/ML injection INJECT 0-20 UNITS INTO THE SKIN AS DIRECTED. SLIDING SCALE  . insulin detemir (LEVEMIR) 100 UNIT/ML injection Inject 0.35-0.38 mLs (35-38 Units total) into the skin every morning.  Marland Kitchen levothyroxine (SYNTHROID, LEVOTHROID) 125 MCG tablet TAKE 1 TABLET BY MOUTH DAILY  . nortriptyline (PAMELOR) 25 MG capsule TAKE 1 CAPSULE BY MOUTH AT BEDTIME  . omeprazole (PRILOSEC) 20 MG capsule TAKE 1 CAPSULE BY MOUTH DAILY (Patient taking differently:  every other day. TAKE 1 CAPSULE BY MOUTH DAILY)  . simvastatin (ZOCOR) 40 MG tablet TAKE 1 TABLET (40 MG TOTAL) BY MOUTH AT BEDTIME.  . [DISCONTINUED] insulin detemir (LEVEMIR) 100 UNIT/ML injection Inject 0.3 mLs (30 Units total) into the skin at bedtime.  . insulin aspart (NOVOLOG) 100 UNIT/ML injection Inject 0-20 units as directed per sliding scale (Patient not taking: Reported on 03/27/2015)  . insulin glargine (LANTUS) 100 UNIT/ML injection Inject 0.35 mLs (35 Units total) into the skin at bedtime. (Patient not taking: Reported on 03/20/2015)  . Insulin Syringe-Needle U-100 (BD INSULIN SYRINGE ULTRAFINE) 31G X 15/64" 0.5 ML MISC Use once daily   No facility-administered encounter medications on file as of 03/27/2015.    Allergies (verified) Codeine; Ciprofloxacin; and Nsaids   History: Past Medical History  Diagnosis Date  . CAD (coronary artery disease)     S/P CABG, Feb 2003, LIMA to the LAD, left free radial artery to an obtuse marginal, spahenous vein graft to diagonal, saphenous vein graft to RCA with endarterectomy  . 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 Cerebrovascular disease     MRA in 2005 with 75% stenosis in distal right vertebral artery and 75% stenosis greater in distal cervical internal carotid artery on teh right, severe bilateral disease and MRA of the extracranial circulation, high grade stenosis of the distal right internal carotid artery at the junction  of the cervical internal carotid artery of the skull base  . Diabetes mellitus   . Hyperlipidemia   . Hypothyroidism   . History of tobacco use     Quit in 1990  . Myocardial infarction ([place]) 2002  . GERD (gastroesophageal reflux disease)   . Arthritis   . Cataract   . Complete heart block ([place]) 05/14/2014    St. Jude BiV PM (serial number [id number]) pacemaker  . CKD stage 3 due to type 2 diabetes mellitus ([place])   . Osteopenia 2016  . Hypertension   . Collapsed lung    Past Surgical  History  Procedure Laterality Date  . Leg amputation below knee      Bilateral  . Arterial bypass surgry      Left; left femoral to posterior tibial bypass gracting, left femoral artery and deep femoral artery endarterectomy with vein patch angioplasty of the common femoral arter and deep femoral artery 06/2005, right fremoral popliteal bypass, right iliac artery restent, bilateral below knee amputations  . Thoracotomy      With drainage of a hemathroax and decortication of fibrothorax  . Coronary artery bypass graft  2002    By Christopher Poot, MD  . Cataract extraction      Bilateral  . Carpal tunnel release      Right  . Amputation  10/17/2011    Procedure: AMPUTATION DIGIT;  Surgeon: Christopher Minion, MD;  Location: [place];  Service: Orthopedics;  Laterality: Left;  Amputation Left Index Finger at PIP Joint  . Finger surgery Right     pointer  . Left heart catheterization with coronary angiogram N/A 05/10/2014    Procedure: LEFT HEART CATHETERIZATION WITH CORONARY ANGIOGRAM;  Surgeon: Christopher Blanks, MD;  Location[facility] C[facility] LAB;  Service: Cardiovascular;  Laterality: N/A;  . Temporary pacemaker insertion N/A 05/14/2014    Procedure: TEMPORARY PACEMAKER INSERTION;  Surgeon: Christopher Ohara, MD;  Location[facility] CATH LAB;  Service: Cardiovascular;  Laterality: N/A;  . Bi-ventricular pacemaker insertion N/A 05/16/2014    Procedure: BI-VENTRICULAR PACEMAKER INSERTION (CRT-P);  Surgeon: Christopher Lance, MD; St. Jude BiV PM (serial number [id number]) pacemaker  . Eye surgery     Family History  Problem Relation Age of Onset  . Cancer Mother     brain and lung  . Diabetes Father 23  . Coronary artery disease Father   . Colon cancer Neg Hx   . Diabetes Sister   . Diabetes Brother   . Diabetes Son    Social History   Occupational History  . Retired    Social History Main Topics  . Smoking status: Former Smoker -- 1.00 packs/day    Types: Cigarettes    Start date: 11/04/1954    Quit  date: 05/06/1988  . Smokeless tobacco: Never Used  . Alcohol Use: No  . Drug Use: No  . Sexual Activity: Not Currently    Do you feel safe at home?  Yes  Dietary issues and exercise activities discussed: Current Exercise Habits:: Exercise is limited by, Limited by:: orthopedic condition(s)  Current Dietary habits:  Since our last visit patient has been eating lunch more consistently and trying to make meal more consistent with regards to [facility] content Cardiac Risk Factors include: advanced age (>23mn, >>28women);diabetes mellitus;dyslipidemia;sedentary lifestyle;male gender  Objective:    Today's Vitals   03/27/15 0936  BP: 120/54  Pulse: 78  Height: 5' 8"  (1.727 m)  Weight: 160 lb (72.576 kg)  PainSc: 0-No  pain   Body mass index is 24.33 kg/(m^2).   Activities of Daily Living In your present state of health, do you have any difficulty performing the following activities: 03/27/2015 05/17/2014  Hearing? N N  Vision? N N  Difficulty concentrating or making decisions? N N  Walking or climbing stairs? Y Y  Dressing or bathing? N N  Doing errands, shopping? N Y  Conservation officer, nature and eating ? N -  Using the Toilet? N -  In the past six months, have you accidently leaked urine? N -  Do you have problems with loss of bowel control? N -  Managing your Medications? Y -  Managing your Finances? N -  Housekeeping or managing your Housekeeping? Y -    Are there smokers in your home (other than you)? No    Depression Screen PHQ 2/9 Scores 03/27/2015 03/13/2015 10/21/2014 06/02/2014  PHQ - 2 Score 3 0 0 0  PHQ- 9 Score 3 - - -    Fall Risk Fall Risk  03/27/2015 03/13/2015 10/21/2014 06/02/2014 05/14/2014  Falls in the past year? Yes No Yes Yes Yes  Number falls in past yr: 2 or more - 1 1 1   Injury with Fall? No - No Yes Yes  Risk Factor Category  High Fall Risk - - - -  Risk for fall due to : History of fall(s);Impaired balance/gait;Impaired mobility - - - -  Follow up Falls  evaluation completed;Falls prevention discussed - - - -    Cognitive Function: MMSE - Mini Mental State Exam 03/27/2015 03/27/2015  Orientation to time 5 5  Orientation to Place 5 5  Registration 3 -  Attention/ Calculation 5 5  Recall 2 -  Language- name 2 objects 2 -  Language- repeat 1 -  Language- follow 3 step command 3 -  Language- read & follow direction 1 -  Write a sentence 1 -  Copy design 1 -  Total score 29 -    Immunizations and Health Maintenance Immunization History  Administered Date(s) Administered  . Influenza,inj,Quad PF,36+ Mos 01/26/2013, 02/14/2014, 03/13/2015  . Pneumococcal Conjugate-13 05/03/2013  . Pneumococcal Polysaccharide-23 11/03/2009  . Zoster 08/30/2013   There are no preventive care reminders to display for this patient.  Patient Care Team: Chipper Herb, MD as PCP - General (Family Medicine) Christopher Lance, MD as Consulting Physician (Cardiology) Sherren Mocha, MD as Consulting Physician (Cardiology) Christopher Minion, MD as Consulting Physician (Orthopedic Surgery)  Indicate any recent Medical Services you may have received from other than [facility] providers in the past year (date may be approximate).    Assessment:    Annual Wellness Visit    Screening Tests Health Maintenance  Topic Date Due  . OPHTHALMOLOGY EXAM  05/03/2015  . URINE MICROALBUMIN  06/03/2015  . HEMOGLOBIN A1C  09/10/2015  . INFLUENZA VACCINE  12/05/2015  . FOOT EXAM  03/12/2016  . DEXA SCAN  07/24/2016  . TETANUS/TDAP  01/04/2021  . ZOSTAVAX  Completed  . PNA vac Low Risk Adult  Completed        Plan:   During the course of the visit Christopher Padilla was educated and counseled about the following appropriate screening and preventive services:   Vaccines to include Pneumoccal, Influenza, Hepatitis B, Td, Zostavax - vaccines are UTD  Colorectal cancer screening - FOBT was given   Cardiovascular disease screening - patient asked about carotid doppler performed  02/2015.  Note was sent to ordering physician to clarify pla going forward.  Diabetes - increase levemir to 35 units for 1 week - if morning FBG is still over 150 in 1 week then patient is to increase to 38 units qd.  Bone Denisty / Osteoporosis Screening - UTD  Glaucoma screening / Diabetic Eye Exam - reminded patient that eye exam is due at end of December.  Nutrition counseling - reviewed CHO counting  Continue arm exercises - also gave handout about chair exercises  Orders Placed This Encounter  Procedures  . Microalbumin / creatinine urine ratio     Patient Instructions (the written plan) were given to the patient.   Christopher Padilla, [facility]   03/27/2015

## 2015-03-28 LAB — MICROALBUMIN / CREATININE URINE RATIO
Creatinine, Urine: 52.3 mg/dL
MICROALB/CREAT RATIO: 28.9 mg/g{creat} (ref 0.0–30.0)
MICROALBUM., U, RANDOM: 15.1 ug/mL

## 2015-04-03 ENCOUNTER — Telehealth: Payer: Self-pay | Admitting: Family Medicine

## 2015-04-03 NOTE — Telephone Encounter (Signed)
Patient called - home BG readings have been as follows:  AM - 248, 123, 333, 243, 119, 218, 187 PM- 411, 235, 188  This am BG was 187 and he ate oatmeal for breakfast about 8am.  He checked BG while on the phone which was about 10:15am and BG was 268. See below for changes recommended - copy left at front desk for patient to pick up.  He is to call and let me know BG readings in 1 week.  Increase Levemir to 37 units once each morning  1 serving of carbohydrate containing food 2 or 3 servings of carbohydrate containing foods 4 or more serving of carbohydrate containing foods  3 units of humalog insulin 5 units of humalog insulin 7 units of humalog insulin     Blood glucose  Amount of Humalog insulin  Less than 100 none  101 to 120 3 unit  121 to 140 4 units  141 to 160 5 units  161 to 180 6 units  181 to 200 8 units  201 or over 10 units

## 2015-04-09 ENCOUNTER — Other Ambulatory Visit: Payer: Self-pay | Admitting: Family Medicine

## 2015-04-10 NOTE — Telephone Encounter (Signed)
Patients last thyroid labs were done on 10-21-14 and were abnormal. Dosage was changed and patient was to return in 6 weeks for recheck labs but did not. Please advise on refill

## 2015-04-10 NOTE — Telephone Encounter (Signed)
Please get thyroid profile and refill medication and any adjustments will be made after test results are returned

## 2015-04-24 ENCOUNTER — Telehealth: Payer: Self-pay | Admitting: Pharmacist

## 2015-04-24 NOTE — Telephone Encounter (Signed)
Left message with patient about follow up appt with Dr [name].

## 2015-04-24 NOTE — Telephone Encounter (Signed)
-----   Message from Nada Libman, MD sent at 03/28/2015  8:52 PM EST ----- Regarding: RE: carotid US He should be scheduled to see Dr. [name], who has operated on him in the past in 6 months for a repeat carotid duplex.  Dopplers in our office were not as bad as the one done in 2015. ----- Message -----    From: Henrene Pastor, PHARMD    Sent: 03/27/2015   9:51 AM      To: Nada Libman, MD Subject: carotid US                                     Dr Myra Gianotti,  I saw Mr. Markell for an annual wellness visit today.  He asked about results from a carotid ultrasound from 02/2015.  I was unsure what the follow up plan for this ultrasound was.  I just want to make sure he did not miss a consult or follow up visit.   Thanks  Henrene Pastor, PharmD, CPP [facility]

## 2015-04-28 ENCOUNTER — Other Ambulatory Visit: Payer: Self-pay | Admitting: Family Medicine

## 2015-05-04 ENCOUNTER — Telehealth: Payer: Self-pay | Admitting: Pharmacist

## 2015-05-04 NOTE — Telephone Encounter (Signed)
Patient states morning BG is 175 to 300. Has had 2 or 3 lows since our last visit.   Patient would like to switch back to taking long acting insulin at night.  Ok to switch - I also recommended that he increase to 40 units of Lantus / Levemir.   Continue to take Humalog tid with sliding scale - patient is due to RTC in 12 days.

## 2015-05-15 ENCOUNTER — Encounter: Payer: Self-pay | Admitting: Pharmacist

## 2015-05-15 ENCOUNTER — Ambulatory Visit: Payer: Self-pay | Admitting: Pharmacist

## 2015-05-15 ENCOUNTER — Ambulatory Visit (INDEPENDENT_AMBULATORY_CARE_PROVIDER_SITE_OTHER): Payer: PPO | Admitting: Pharmacist

## 2015-05-15 VITALS — BP 120/60 | HR 64 | Ht 68.0 in | Wt 160.0 lb

## 2015-05-15 DIAGNOSIS — E1140 Type 2 diabetes mellitus with diabetic neuropathy, unspecified: Secondary | ICD-10-CM | POA: Diagnosis not present

## 2015-05-15 DIAGNOSIS — IMO0002 Reserved for concepts with insufficient information to code with codable children: Secondary | ICD-10-CM

## 2015-05-15 DIAGNOSIS — E1165 Type 2 diabetes mellitus with hyperglycemia: Secondary | ICD-10-CM

## 2015-05-15 MED ORDER — OMEPRAZOLE 20 MG PO CPDR: DELAYED_RELEASE_CAPSULE | ORAL | Status: DC

## 2015-05-15 MED ORDER — LEVOTHYROXINE SODIUM 125 MCG PO TABS: 125.0000 ug | ORAL_TABLET | Freq: Every day | ORAL | Status: DC

## 2015-05-15 MED ORDER — GLUCOSE BLOOD VI STRP: ORAL_STRIP | Status: DC

## 2015-05-15 MED ORDER — NORTRIPTYLINE HCL 25 MG PO CAPS: 25.0000 mg | ORAL_CAPSULE | Freq: Every day | ORAL | Status: DC

## 2015-05-15 MED ORDER — "INSULIN SYRINGE-NEEDLE U-100 31G X 15/64"" 0.5 ML MISC": Status: DC

## 2015-05-15 MED ORDER — CARVEDILOL PHOSPHATE ER 10 MG PO CP24
10.0000 mg | ORAL_CAPSULE | Freq: Every day | ORAL | Status: DC
Start: 2015-05-15 — End: 2015-08-08

## 2015-05-15 MED ORDER — GABAPENTIN 600 MG PO TABS: ORAL_TABLET | ORAL | Status: DC

## 2015-05-15 NOTE — Progress Notes (Signed)
Subjective:    Christopher Padilla is a 75 y.o. male who presents for an reevaluation of Type 2 diabetes mellitus.  Last visit was about 6 weeks ago.  Insulin adjustments were made as patient was having lows after lunch and highs in the am.   The patient was initially diagnosed with Type 2 diabetes mellitus for 35 year (at 75 yo).  Patient also voices concerns about carvedilol.  His taking 3.125mg  1 and 1/2 tablets bid but states that when he halves tablets they crumble.    Known diabetic complications: retinopathy, peripheral neuropathy, cardiovascular disease and amplutations - BKA  - bilaterally and pointer finger of left hand Cardiovascular risk factors: advanced age (older than 96 for men, 9 for women), diabetes mellitus, dyslipidemia, hypertension and male gender Current diabetic medications include Lantus 34 units at bedtime and Humalog per below  1 serving of carbohydrate containing food 2 or 3 servings of carbohydrate containing foods 4 or more serving of carbohydrate containing foods  2 units of humalog insulin 4 units of humalog insulin 6 units of humalog insulin     Blood glucose  Amount of Humalog insulin  Less than 100 none  101 to 120 2 unit  121 to 140 3 units  141 to 160 4 units  161 to 180 5 units  181 to 200 6 units  201 or over 8 units        Eye exam current (within one year): no - but has appt this month Weight trend: stable Prior visit with dietician: yes  Current diet: patient is now eating lunch regularly.    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   Home blood sugar records: am readings - 238, 120, 170, 116, 150, 148, 170, 115, 140, 153, 198, 145  Any episodes of hypoglycemia? Once over last 6 weeks  Is He on ACE inhibitor or angiotensin II receptor  blocker?  No ramipril was discontinued around 04/2014 - possibly related to increase serum creatinine which has since decreased and is currently stable.   The following portions of the patient's history were reviewed and updated as appropriate: allergies, current medications, past family history, past medical history, past social history, past surgical history and problem list.   Objective:    There were no vitals taken for this visit.  Lab Review GLUCOSE (mg/dL)  Date Value  96/75/9163 190*  10/21/2014 321*  05/04/2014 230*   GLUCOSE, BLD (mg/dL)  Date Value  84/66/5993 264*  05/15/2014 231*  05/14/2014 233*   CO2 (mmol/L)  Date Value  03/13/2015 26  10/21/2014 26  05/17/2014 25   BUN (mg/dL)  Date Value  57/05/7791 15  10/21/2014 23  05/17/2014 15  05/15/2014 28*  05/14/2014 37*  05/04/2014 20   CREAT (mg/dL)  Date Value  90/30/0923 1.22   CREATININE, SER (mg/dL)  Date Value  30/11/6224 1.36*  10/21/2014 1.36*  05/17/2014 1.25      Assessment:    Diabetes Mellitus type II, under inadequate control.   HTN - slightly elevated Plan:    1.  Rx changes:   Change Lantus to 35 units each morning  Change carvedilol to CR 10mg  take 1 tablet daily  Increase checking BG to tid - qid (prior to each meal and at bedtime)   Humalog with each meal per sliding  scale - scale updated  1 serving of carbohydrate containing food 2 or 3 servings of carbohydrate containing foods 4 or more serving of carbohydrate containing foods  3 units of humalog insulin 5 units of humalog insulin 7 units of humalog insulin     Blood glucose  Amount of Humalog insulin  Less than 100 none  101 to 120 2 units  121 to 140 4 units  141 to 160 6 units  161 to 180 7 units  181 to 200 8 units  201 or over 10 units        2.  Education: Reviewed 'ABCs' of diabetes management (respective goals in parentheses):  A1C (<7), blood pressure  (<130/80), and cholesterol (LDL <100). 3.  Compliance at present is estimated to be improving. Efforts to improve compliance (if necessary) will be directed at dietary modifications:  continue eat a more susbtantial lunch.  4.  Follow up:   07/2015 with PCP  Henrene Pastor, PharmD, CPP, CDE

## 2015-05-15 NOTE — Patient Instructions (Signed)
   Increase Lantus to 35 units each morning for the next 3 to 5 days,  If blood glucose in morning is still over 120 and you have not had any lows during night then try to increase to 36 units once a day 1 serving of carbohydrate containing food 2 or 3 servings of carbohydrate containing foods 4 or more serving of carbohydrate containing foods  3 units of humalog insulin 5 units of humalog insulin 7 units of humalog insulin     Blood glucose  Amount of Humalog insulin  Less than 100 none  101 to 120 2 units  121 to 140 4 units  141 to 160 6 units  161 to 180 7 units  181 to 200 8 units  201 or over 10 units

## 2015-05-29 LAB — HM DIABETES EYE EXAM

## 2015-05-31 ENCOUNTER — Encounter: Payer: Self-pay | Admitting: *Deleted

## 2015-06-30 ENCOUNTER — Telehealth: Payer: Self-pay | Admitting: Family Medicine

## 2015-06-30 ENCOUNTER — Encounter: Payer: Self-pay | Admitting: Family Medicine

## 2015-06-30 ENCOUNTER — Encounter (HOSPITAL_COMMUNITY): Payer: Self-pay | Admitting: *Deleted

## 2015-06-30 ENCOUNTER — Inpatient Hospital Stay (HOSPITAL_COMMUNITY): Payer: PPO

## 2015-06-30 ENCOUNTER — Observation Stay (HOSPITAL_COMMUNITY)
Admission: AD | Admit: 2015-06-30 | Discharge: 2015-07-01 | Disposition: A | Discharge status: Home / Self Care | Payer: PPO | Source: Ambulatory Visit | Attending: Internal Medicine | Admitting: Internal Medicine

## 2015-06-30 ENCOUNTER — Ambulatory Visit (INDEPENDENT_AMBULATORY_CARE_PROVIDER_SITE_OTHER): Payer: PPO | Admitting: Family Medicine

## 2015-06-30 ENCOUNTER — Other Ambulatory Visit: Payer: Self-pay

## 2015-06-30 VITALS — BP 122/59 | HR 91 | Temp 97.4°F | Ht 68.0 in | Wt 160.0 lb

## 2015-06-30 DIAGNOSIS — Z7982 Long term (current) use of aspirin: Secondary | ICD-10-CM | POA: Insufficient documentation

## 2015-06-30 DIAGNOSIS — L089 Local infection of the skin and subcutaneous tissue, unspecified: Secondary | ICD-10-CM | POA: Diagnosis not present

## 2015-06-30 DIAGNOSIS — Z89512 Acquired absence of left leg below knee: Secondary | ICD-10-CM | POA: Insufficient documentation

## 2015-06-30 DIAGNOSIS — I129 Hypertensive chronic kidney disease with stage 1 through stage 4 chronic kidney disease, or unspecified chronic kidney disease: Secondary | ICD-10-CM | POA: Insufficient documentation

## 2015-06-30 DIAGNOSIS — L02415 Cutaneous abscess of right lower limb: Secondary | ICD-10-CM

## 2015-06-30 DIAGNOSIS — I255 Ischemic cardiomyopathy: Secondary | ICD-10-CM | POA: Diagnosis present

## 2015-06-30 DIAGNOSIS — E1122 Type 2 diabetes mellitus with diabetic chronic kidney disease: Secondary | ICD-10-CM

## 2015-06-30 DIAGNOSIS — E039 Hypothyroidism, unspecified: Secondary | ICD-10-CM | POA: Diagnosis not present

## 2015-06-30 DIAGNOSIS — E785 Hyperlipidemia, unspecified: Secondary | ICD-10-CM | POA: Diagnosis not present

## 2015-06-30 DIAGNOSIS — I2510 Atherosclerotic heart disease of native coronary artery without angina pectoris: Secondary | ICD-10-CM

## 2015-06-30 DIAGNOSIS — Z89511 Acquired absence of right leg below knee: Secondary | ICD-10-CM | POA: Diagnosis not present

## 2015-06-30 DIAGNOSIS — Z950 Presence of cardiac pacemaker: Secondary | ICD-10-CM | POA: Diagnosis present

## 2015-06-30 DIAGNOSIS — E1139 Type 2 diabetes mellitus with other diabetic ophthalmic complication: Secondary | ICD-10-CM | POA: Insufficient documentation

## 2015-06-30 DIAGNOSIS — Z89519 Acquired absence of unspecified leg below knee: Secondary | ICD-10-CM

## 2015-06-30 DIAGNOSIS — E034 Atrophy of thyroid (acquired): Secondary | ICD-10-CM

## 2015-06-30 DIAGNOSIS — N183 Chronic kidney disease, stage 3 unspecified: Secondary | ICD-10-CM | POA: Diagnosis present

## 2015-06-30 DIAGNOSIS — L0390 Cellulitis, unspecified: Secondary | ICD-10-CM

## 2015-06-30 DIAGNOSIS — IMO0001 Reserved for inherently not codable concepts without codable children: Secondary | ICD-10-CM

## 2015-06-30 DIAGNOSIS — Z794 Long term (current) use of insulin: Secondary | ICD-10-CM

## 2015-06-30 DIAGNOSIS — S88011A Complete traumatic amputation at knee level, right lower leg, initial encounter: Secondary | ICD-10-CM | POA: Diagnosis not present

## 2015-06-30 DIAGNOSIS — I10 Essential (primary) hypertension: Secondary | ICD-10-CM | POA: Diagnosis present

## 2015-06-30 DIAGNOSIS — E1159 Type 2 diabetes mellitus with other circulatory complications: Secondary | ICD-10-CM

## 2015-06-30 DIAGNOSIS — E038 Other specified hypothyroidism: Secondary | ICD-10-CM

## 2015-06-30 DIAGNOSIS — Z89521 Acquired absence of right knee: Secondary | ICD-10-CM

## 2015-06-30 DIAGNOSIS — I152 Hypertension secondary to endocrine disorders: Secondary | ICD-10-CM | POA: Diagnosis present

## 2015-06-30 DIAGNOSIS — Z79899 Other long term (current) drug therapy: Secondary | ICD-10-CM | POA: Diagnosis not present

## 2015-06-30 DIAGNOSIS — L03115 Cellulitis of right lower limb: Principal | ICD-10-CM | POA: Diagnosis present

## 2015-06-30 DIAGNOSIS — M25561 Pain in right knee: Secondary | ICD-10-CM | POA: Diagnosis not present

## 2015-06-30 LAB — CBC WITH DIFFERENTIAL/PLATELET
BASOS ABS: 0 10*3/uL (ref 0.0–0.1)
BASOS PCT: 0 %
Eosinophils Absolute: 0.2 10*3/uL (ref 0.0–0.7)
Eosinophils Relative: 3 %
HCT: 39.3 % (ref 39.0–52.0)
Hemoglobin: 13.1 g/dL (ref 13.0–17.0)
Lymphocytes Relative: 29 %
Lymphs Abs: 1.8 10*3/uL (ref 0.7–4.0)
MCH: 30.3 pg (ref 26.0–34.0)
MCHC: 33.3 g/dL (ref 30.0–36.0)
MCV: 91 fL (ref 78.0–100.0)
Monocytes Absolute: 0.6 10*3/uL (ref 0.1–1.0)
Monocytes Relative: 10 %
NEUTROS ABS: 3.5 10*3/uL (ref 1.7–7.7)
NEUTROS PCT: 58 %
PLATELETS: 187 10*3/uL (ref 150–400)
RBC: 4.32 MIL/uL (ref 4.22–5.81)
RDW: 13.9 % (ref 11.5–15.5)
WBC: 6 10*3/uL (ref 4.0–10.5)

## 2015-06-30 LAB — MRSA PCR SCREENING: MRSA BY PCR: NEGATIVE

## 2015-06-30 LAB — GLUCOSE, CAPILLARY
GLUCOSE-CAPILLARY: 218 mg/dL — AB (ref 65–99)
Glucose-Capillary: 269 mg/dL — ABNORMAL HIGH (ref 65–99)

## 2015-06-30 LAB — COMPREHENSIVE METABOLIC PANEL
ALK PHOS: 140 U/L — AB (ref 38–126)
ALT: 16 U/L — ABNORMAL LOW (ref 17–63)
ANION GAP: 7 (ref 5–15)
AST: 20 U/L (ref 15–41)
Albumin: 3.6 g/dL (ref 3.5–5.0)
BILIRUBIN TOTAL: 0.5 mg/dL (ref 0.3–1.2)
BUN: 26 mg/dL — ABNORMAL HIGH (ref 6–20)
CALCIUM: 8.7 mg/dL — AB (ref 8.9–10.3)
CO2: 26 mmol/L (ref 22–32)
Chloride: 103 mmol/L (ref 101–111)
Creatinine, Ser: 1.39 mg/dL — ABNORMAL HIGH (ref 0.61–1.24)
GFR calc non Af Amer: 48 mL/min — ABNORMAL LOW (ref >60)
GFR, EST AFRICAN AMERICAN: 55 mL/min — AB (ref >60)
Glucose, Bld: 259 mg/dL — ABNORMAL HIGH (ref 65–99)
Potassium: 4.8 mmol/L (ref 3.5–5.1)
Sodium: 136 mmol/L (ref 135–145)
TOTAL PROTEIN: 7.4 g/dL (ref 6.5–8.1)

## 2015-06-30 MED ORDER — SIMVASTATIN 20 MG PO TABS
40.0000 mg | ORAL_TABLET | Freq: Every day | ORAL | Status: DC
  Filled 2015-06-30: qty 2

## 2015-06-30 MED ORDER — NAPHAZOLINE-GLYCERIN 0.012-0.2 % OP SOLN
1.0000 [drp] | Freq: Two times a day (BID) | OPHTHALMIC | Status: DC
  Administered 2015-07-01: 1 [drp] via OPHTHALMIC
  Filled 2015-06-30: qty 15

## 2015-06-30 MED ORDER — VANCOMYCIN HCL IN DEXTROSE 1-5 GM/200ML-% IV SOLN
1000.0000 mg | Freq: Once | INTRAVENOUS | Status: AC
  Administered 2015-06-30: 1000 mg via INTRAVENOUS
  Filled 2015-06-30: qty 200

## 2015-06-30 MED ORDER — LEVOTHYROXINE SODIUM 25 MCG PO TABS
125.0000 ug | ORAL_TABLET | Freq: Every day | ORAL | Status: DC
  Administered 2015-07-01: 125 ug via ORAL
  Filled 2015-06-30: qty 1

## 2015-06-30 MED ORDER — INSULIN ASPART 100 UNIT/ML ~~LOC~~ SOLN
0.0000 [IU] | Freq: Every day | SUBCUTANEOUS | Status: DC
  Administered 2015-06-30: 3 [IU] via SUBCUTANEOUS

## 2015-06-30 MED ORDER — SODIUM CHLORIDE 0.9 % IV SOLN
INTRAVENOUS | Status: DC
  Administered 2015-06-30: 19:00:00 via INTRAVENOUS

## 2015-06-30 MED ORDER — ASPIRIN 81 MG PO CHEW
81.0000 mg | CHEWABLE_TABLET | Freq: Every day | ORAL | Status: DC
  Administered 2015-07-01: 81 mg via ORAL
  Filled 2015-06-30: qty 1

## 2015-06-30 MED ORDER — VITAMIN D 1000 UNITS PO TABS
1000.0000 [IU] | ORAL_TABLET | Freq: Every day | ORAL | Status: DC
  Administered 2015-07-01: 1000 [IU] via ORAL
  Filled 2015-06-30: qty 1

## 2015-06-30 MED ORDER — GABAPENTIN 300 MG PO CAPS
600.0000 mg | ORAL_CAPSULE | Freq: Three times a day (TID) | ORAL | Status: DC
  Administered 2015-06-30 – 2015-07-01 (×2): 600 mg via ORAL
  Filled 2015-06-30 (×2): qty 2

## 2015-06-30 MED ORDER — NORTRIPTYLINE HCL 25 MG PO CAPS
25.0000 mg | ORAL_CAPSULE | Freq: Every day | ORAL | Status: DC
  Administered 2015-06-30: 25 mg via ORAL
  Filled 2015-06-30: qty 1

## 2015-06-30 MED ORDER — INSULIN ASPART 100 UNIT/ML ~~LOC~~ SOLN
0.0000 [IU] | Freq: Three times a day (TID) | SUBCUTANEOUS | Status: DC
  Administered 2015-07-01: 2 [IU] via SUBCUTANEOUS

## 2015-06-30 MED ORDER — HYDRALAZINE HCL 20 MG/ML IJ SOLN: 5.0000 mg | INTRAMUSCULAR | Status: DC | PRN

## 2015-06-30 MED ORDER — VANCOMYCIN HCL IN DEXTROSE 750-5 MG/150ML-% IV SOLN
750.0000 mg | Freq: Two times a day (BID) | INTRAVENOUS | Status: DC
  Administered 2015-07-01: 750 mg via INTRAVENOUS
  Filled 2015-06-30 (×5): qty 150

## 2015-06-30 MED ORDER — ENOXAPARIN SODIUM 40 MG/0.4ML ~~LOC~~ SOLN
40.0000 mg | SUBCUTANEOUS | Status: DC
Start: 2015-06-30 — End: 2015-07-01
  Administered 2015-06-30: 40 mg via SUBCUTANEOUS
  Filled 2015-06-30: qty 0.4

## 2015-06-30 MED ORDER — ONDANSETRON HCL 4 MG/2ML IJ SOLN: 4.0000 mg | Freq: Four times a day (QID) | INTRAMUSCULAR | Status: DC | PRN

## 2015-06-30 MED ORDER — PANTOPRAZOLE SODIUM 40 MG PO TBEC
40.0000 mg | DELAYED_RELEASE_TABLET | Freq: Every day | ORAL | Status: DC
  Administered 2015-07-01: 40 mg via ORAL
  Filled 2015-06-30: qty 1

## 2015-06-30 MED ORDER — INSULIN DETEMIR 100 UNIT/ML ~~LOC~~ SOLN
35.0000 [IU] | Freq: Every day | SUBCUTANEOUS | Status: DC
  Administered 2015-06-30: 35 [IU] via SUBCUTANEOUS
  Filled 2015-06-30 (×3): qty 0.35

## 2015-06-30 MED ORDER — CARVEDILOL PHOSPHATE ER 10 MG PO CP24
10.0000 mg | ORAL_CAPSULE | Freq: Every day | ORAL | Status: DC
  Administered 2015-07-01: 10 mg via ORAL
  Filled 2015-06-30 (×3): qty 1

## 2015-06-30 NOTE — Progress Notes (Signed)
   Subjective:    Patient ID: Christopher Padilla, male    DOB: 1940/06/27, 75 y.o.   MRN: [id number]  HPI Pt here today c/o what he thinks is either a blister or infection starting on right knee (stump) where prosthesis is rubbing. He has had some irritation for about a week. He has had problems with this knee before.   Review of Systems Abscess secondary to blister over the knee proximal to the stump on his right lower leg    Objective:   Physical Exam  Constitutional: He is oriented to person, place, and time. He appears well-developed and well-nourished. No distress.  HENT:  Head: Normocephalic and atraumatic.  Eyes: Conjunctivae and EOM are normal. Pupils are equal, round, and reactive to light.  Neck: Normal range of motion. Neck supple.  Musculoskeletal:  Bilateral below the knee amputee  Neurological: He is alert and oriented to person, place, and time.  Skin: Skin is warm and dry. No rash noted. There is erythema.  Abscess right anterior knee with surrounding erythema  Psychiatric: He has a normal mood and affect. His behavior is normal. Judgment and thought content normal.  Nursing note reviewed.  BP 122/59 mmHg  Pulse 91  Temp(Src) 97.4 F (36.3 C) (Oral)  Ht 5\' 8"  (1.727 m)  Wt 160 lb (72.576 kg)  BMI 24.33 kg/m2 Large abscess nondraining with surrounding erythema over the knee and proximal to the stump of the right knee       Assessment & Plan:  1. Skin infection -Assisted just become worse and patient will need admission and the CBC and cultures that were ordered will be not done. - CBC with Differential/Platelet - Anaerobic and Aerobic Culture  2. Hypothyroidism due to acquired atrophy of thyroid  3. 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 Hyperlipidemia LDL goal <70  5. Coronary artery disease involving native coronary artery of native heart without angina pectoris  6. CKD stage 3 due to type 2 diabetes mellitus  (HCC)  7. Abscess of right knee -Admit to the hospital for IV antibiotics and incision and drainage  Patient Instructions  We will work out admission for the patient to the hospital for IV antibiotics and incision and drainage of abscess of right knee below the knee amputation   admission and is being arranged at [facility] in [place]

## 2015-06-30 NOTE — Progress Notes (Signed)
Dr. Christell Constant talked with Dr. Theador Hawthorne at [facility]. Bed control notified at [facility] for admission. Patient notified to go to Admitting at [facility] for admission under Dr. Elliot Cousin. Patient agreeable and voices understanding. Talked with Angelique Blonder in bed control at [facility].

## 2015-06-30 NOTE — Telephone Encounter (Signed)
Appt given for today 

## 2015-06-30 NOTE — H&P (Addendum)
[facility] History and Physical  KWELI GRASSEL LPN:[id number] DOB: [birth year] DOA: 06/30/2015  Referring physician: PCP, Dr. Laurance Flatten PCP: Redge Gainer, MD   Chief Complaint: Right knee pain, redness and swelling.  HPI: Christopher Padilla is a 75 y.o. male with a history of bilateral lower extremity BKA, ischemic cardiomyopathy, heart block, status post biventricular pacemaker, insulin requiring diabetes mellitus, and chronic kidney disease, who presents from Dr. Tawanna Sat office with a chief complaint of right knee pain and swelling. Patient's symptoms started approximately 1 week ago as he developed some discomfort over his right knee which progressed to redness and a blister that formed on top of the knee joint a couple days ago. He reports having trouble with his new prosthesis and has been getting adjustments made so that the prosthesis can properly fit. He tried to maneuver the prosthesis with adding a sock and putting a Band-Aid over the red and tender, but it did not work. The knee has become more uncomfortable, but he does not believe the infection is within the knee joint itself. He denies subjective fever or chills, presyncopal symptoms, nausea, vomiting, diarrhea, chest pain, shortness of breath, or swelling of his stumps bilaterally. He presented to Dr. Tawanna Sat office today. Dr. Laurance Flatten was concerned about worsening of the infection without IV antibiotics, so the patient is being directly admitted for further evaluation and management.  Laboratory studies ordered and results are noted for a normal WBC of 6.0, BUN of 26, creatinine of 1.39, and glucose of 259. He is afebrile and hemodynamically stable, but moderately hypertensive.     Review of Systems:  As above in history present illness. More specifically, he has had no chest pain or congestive heart failure type symptoms. Otherwise review of systems is negative.  Past Medical History  Diagnosis Date  . CAD (coronary artery  disease)     S/P CABG, Feb 2003, LIMA to the LAD, left free radial artery to an obtuse marginal, spahenous vein graft to diagonal, saphenous vein graft to RCA with endarterectomy  . S/P below knee amputation ([place]) 2001, 2004    bilaterally  . Cerebrovascular disease     MRA in 2005 with 75% stenosis in distal right vertebral artery and 75% stenosis greater in distal cervical internal carotid artery on teh right, severe bilateral disease and MRA of the extracranial circulation, high grade stenosis of the distal right internal carotid artery at the junction of the cervical internal carotid artery of the skull base  . Diabetes mellitus   . Hyperlipidemia   . Hypothyroidism   . History of tobacco use     Quit in 1990  . Myocardial infarction ([place]) 2002  . GERD (gastroesophageal reflux disease)   . Arthritis   . Cataract   . Complete heart block ([place]) 05/14/2014    St. Jude BiV PM (serial number [id number]) pacemaker  . CKD stage 3 due to type 2 diabetes mellitus ([place])   . Osteopenia 2016  . Hypertension   . Collapsed lung    Past Surgical History  Procedure Laterality Date  . Leg amputation below knee      Bilateral  . Arterial bypass surgry      Left; left femoral to posterior tibial bypass gracting, left femoral artery and deep femoral artery endarterectomy with vein patch angioplasty of the common femoral arter and deep femoral artery 06/2005, right fremoral popliteal bypass, right iliac artery restent, bilateral below knee amputations  . Thoracotomy      With drainage  of a hemathroax and decortication of fibrothorax  . Coronary artery bypass graft  2002    By Ivin Poot, MD  . Cataract extraction      Bilateral  . Carpal tunnel release      Right  . Amputation  10/17/2011    Procedure: AMPUTATION DIGIT;  Surgeon: Newt Minion, MD;  Location: [place];  Service: Orthopedics;  Laterality: Left;  Amputation Left Index Finger at PIP Joint  . Finger surgery Right     pointer  . Left  heart catheterization with coronary angiogram N/A 05/10/2014    Procedure: LEFT HEART CATHETERIZATION WITH CORONARY ANGIOGRAM;  Surgeon: Burnell Blanks, MD;  Location[facility] LAB;  Service: Cardiovascular;  Laterality: N/A;  . Temporary pacemaker insertion N/A 05/14/2014    Procedure: TEMPORARY PACEMAKER INSERTION;  Surgeon: Blane Ohara, MD;  Location: [facility] CATH LAB;  Service: Cardiovascular;  Laterality: N/A;  . Bi-ventricular pacemaker insertion N/A 05/16/2014    Procedure: BI-VENTRICULAR PACEMAKER INSERTION (CRT-P);  Surgeon: Evans Lance, MD; St. Jude BiV PM (serial number [id number]) pacemaker  . Eye surgery    . Insert / replace / remove pacemaker     Social History:  reports that he quit smoking about 27 years ago. His smoking use included Cigarettes. He started smoking about 60 years ago. He smoked 1.00 pack per day. He has never used smokeless tobacco. He reports that he does not drink alcohol or use illicit drugs.  Allergies  Allergen Reactions  . Codeine     hallucinations  . Ciprofloxacin Diarrhea  . Nsaids Other (See Comments)    GI upset    Family History  Problem Relation Age of Onset  . Cancer Mother     brain and lung  . Diabetes Father 44  . Coronary artery disease Father   . Colon cancer Neg Hx   . Diabetes Sister   . Diabetes Brother   . Diabetes Son     Prior to Admission medications   Medication Sig Start Date End Date Taking? Authorizing Provider  aspirin 81 MG tablet Take 81 mg by mouth daily.     Yes Historical Provider, MD  carvedilol (COREG CR) 10 MG 24 hr capsule Take 1 capsule (10 mg total) by mouth daily. 05/15/15  Yes Chipper Herb, MD  Chlorphen-Pseudoephed-APAP (COLD MEDICINE PLUS) 2-30-325 MG CAPS Take 1-2 capsules by mouth daily as needed (for cold and cough).   Yes Historical Provider, MD  Cholecalciferol (VITAMIN D PO) Take 1,000 Units by mouth daily.    Yes Historical Provider, MD  gabapentin (NEURONTIN) 600 MG tablet TAKE 1 CAPSULE BY  MOUTH THREE TIMES A DAY 05/15/15  Yes Chipper Herb, MD  insulin aspart (NOVOLOG) 100 UNIT/ML injection Inject 0-20 units as directed per sliding scale Patient taking differently: Inject 0-20 Units into the skin 3 (three) times daily with meals. Inject 0-20 units as directed per sliding scale 03/14/15  Yes Chipper Herb, MD  insulin detemir (LEVEMIR) 100 UNIT/ML injection Inject 35 Units into the skin at bedtime.   Yes Historical Provider, MD  levothyroxine (SYNTHROID, LEVOTHROID) 125 MCG tablet Take 1 tablet (125 mcg total) by mouth daily. 05/15/15  Yes Chipper Herb, MD  nortriptyline (PAMELOR) 25 MG capsule Take 1 capsule (25 mg total) by mouth at bedtime. 05/15/15  Yes Chipper Herb, MD  omeprazole (PRILOSEC) 20 MG capsule TAKE 1 CAPSULE BY MOUTH DAILY 05/15/15  Yes Chipper Herb, MD  simvastatin [facility])  40 MG tablet TAKE 1 TABLET (40 MG TOTAL) BY MOUTH AT BEDTIME. 02/10/15  Yes Chipper Herb, MD  tetrahydrozoline (EYE DROPS) 0.05 % ophthalmic solution Apply 1-2 drops to eye daily.   Yes Historical Provider, MD  ACCU-CHEK SOFTCLIX LANCETS lancets Check BG 4Xdaily DX E11.65 05/23/14   Chipper Herb, MD  Blood Glucose Monitoring Suppl (ACCU-CHEK AVIVA PLUS) W/DEVICE KIT Check BG 4XDaily DX E11.65 05/23/14   Chipper Herb, MD  glucose blood (ACCU-CHEK AVIVA PLUS) test strip Check BG 4xdaily DX Ell.65 05/15/15   Chipper Herb, MD  Insulin Syringe-Needle U-100 (BD INSULIN SYRINGE ULTRAFINE) 31G X 15/64" 0.5 ML MISC Use once daily 05/15/15   Chipper Herb, MD   Physical Exam: Filed Vitals:   06/30/15 1746  BP: 166/57  Pulse: 74  Temp: 98.2 F (36.8 C)  TempSrc: Oral  Resp: 18  Height: 5' 8"  (1.727 m)  Weight: 66.089 kg (145 lb 11.2 oz)  SpO2: 96%    Wt Readings from Last 3 Encounters:  06/30/15 66.089 kg (145 lb 11.2 oz)  06/30/15 72.576 kg (160 lb)  05/15/15 72.576 kg (160 lb)    General:  Appears calm and comfortable; pleasant 75 year old Caucasian man in no acute distress. Eyes: PERRL,  normal lids, irises & conjunctiva; conjunctivae are clear and sclerae are white. ENT: grossly normal hearing; oropharynx reveals mildly dry mucous membranes. Neck: no LAD, masses or thyromegaly; supple Cardiovascular: S1, S2, no murmurs rubs or gallops. No LE edema. Telemetry: SR, no arrhythmias  Respiratory: CTA bilaterally, no w/r/r. Normal respiratory effort. Abdomen: Positive bowel sounds, soft, nontender, nondistended. Skin: Right knee with erythema over the patella, approximating 4-5cm; there is some fluctuance and mild warmth; there is a small tan blister on top; no active drainage. Musculoskeletal: Right BKA with good flexion and extension and no tenderness over the joint itself; left BKA without abnormalities and good range of motion with flexion and extension. Psychiatric: grossly normal mood and affect, speech fluent and appropriate Neurologic: grossly non-focal; cranial nerves II through XII are grossly intact.           Labs on Admission:  Basic Metabolic Panel:  Recent Labs Lab 06/30/15 1724  NA 136  K 4.8  CL 103  CO2 26  GLUCOSE 259*  BUN 26*  CREATININE 1.39*  CALCIUM 8.7*   Liver Function Tests:  Recent Labs Lab 06/30/15 1724  AST 20  ALT 16*  ALKPHOS 140*  BILITOT 0.5  PROT 7.4  ALBUMIN 3.6   No results for input(s): LIPASE, AMYLASE in the last 168 hours. No results for input(s): AMMONIA in the last 168 hours. CBC:  Recent Labs Lab 06/30/15 1724  WBC 6.0  NEUTROABS 3.5  HGB 13.1  HCT 39.3  MCV 91.0  PLT 187   Cardiac Enzymes: No results for input(s): CKTOTAL, CKMB, CKMBINDEX, TROPONINI in the last 168 hours.  BNP (last 3 results) No results for input(s): BNP in the last 8760 hours.  ProBNP (last 3 results) No results for input(s): PROBNP in the last 8760 hours.  CBG:  Recent Labs Lab 06/30/15 1844  GLUCAP 218*    Radiological Exams on Admission: No results found.  EKG: Independently reviewed. Biventricular paced rhythm,  rate of 74 bpm.  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 There is clear cellulitis of the right knee, but there does not appear to be an infected joint. However, there could be a small abscess underneath the blister. He is nontoxic and afebrile. His white blood cell count is not elevated. However, in the setting of his chronic heart disease and diabetes, starting IV antibiotic therapy is reasonable. We'll start treatment with IV vancomycin. Will order warm compresses to the right knee to see if it comes to a head. Will order a 2 view x-ray to assess for a deeper infection. Will consult general surgeon, Dr. Arnoldo Morale not emergently. The cellulitic area may need to be I&D, but will defer the decision to Dr. Arnoldo Morale. We'll treat the patient's pain with as needed opiates. 2. Patient was instructed to not use the prosthesis given the infection. He reports that he will follow-up with biotech in [place] for further adjustments of his prosthesis. 3. We'll continue all of his chronic medications. His ischemic cardiomyopathy appears to be stable and compensated. 4. Sliding scale NovoLog moderate scale will be ordered and Levemir will be continued. 5. Patient's blood pressure is moderately elevated. He is treated with Coreg CR once daily. We'll add when necessary IV hydralazine for systolic blood pressure greater than 170. 6. Patient is BUN and creatinine are elevated, but stable and at baseline. He has stage III chronic kidney disease. We'll add gentle IV fluids overnight which can be discontinued tomorrow. 7. We'll order hemoglobin A1c and TSH for further  evaluation.    Code Status: Full code DVT Prophylaxis: Lovenox Family Communication:  Disposition Plan: Anticipate discharge to home in 48-72 hours.  Time spent: One hour  [facility] Pager [phone]

## 2015-06-30 NOTE — Patient Instructions (Signed)
We will work out admission for the patient to the hospital for IV antibiotics and incision and drainage of abscess of right knee below the knee amputation

## 2015-07-01 DIAGNOSIS — L0390 Cellulitis, unspecified: Secondary | ICD-10-CM | POA: Diagnosis present

## 2015-07-01 DIAGNOSIS — L03115 Cellulitis of right lower limb: Secondary | ICD-10-CM | POA: Diagnosis not present

## 2015-07-01 DIAGNOSIS — Z950 Presence of cardiac pacemaker: Secondary | ICD-10-CM | POA: Diagnosis not present

## 2015-07-01 DIAGNOSIS — E1122 Type 2 diabetes mellitus with diabetic chronic kidney disease: Secondary | ICD-10-CM | POA: Diagnosis not present

## 2015-07-01 DIAGNOSIS — I255 Ischemic cardiomyopathy: Secondary | ICD-10-CM | POA: Diagnosis not present

## 2015-07-01 DIAGNOSIS — E039 Hypothyroidism, unspecified: Secondary | ICD-10-CM

## 2015-07-01 LAB — CBC WITH DIFFERENTIAL/PLATELET
BASOS: 1 %
Basophils Absolute: 0 10*3/uL (ref 0.0–0.2)
EOS (ABSOLUTE): 0.1 10*3/uL (ref 0.0–0.4)
Eos: 3 %
Hematocrit: 38.8 % (ref 37.5–51.0)
Hemoglobin: 13 g/dL (ref 12.6–17.7)
IMMATURE GRANS (ABS): 0 10*3/uL (ref 0.0–0.1)
Immature Granulocytes: 0 %
LYMPHS: 32 %
Lymphocytes Absolute: 1.8 10*3/uL (ref 0.7–3.1)
MCH: 30 pg (ref 26.6–33.0)
MCHC: 33.5 g/dL (ref 31.5–35.7)
MCV: 89 fL (ref 79–97)
Monocytes Absolute: 0.5 10*3/uL (ref 0.1–0.9)
Monocytes: 9 %
NEUTROS ABS: 3.1 10*3/uL (ref 1.4–7.0)
Neutrophils: 55 %
PLATELETS: 221 10*3/uL (ref 150–379)
RBC: 4.34 x10E6/uL (ref 4.14–5.80)
RDW: 14 % (ref 12.3–15.4)
WBC: 5.7 10*3/uL (ref 3.4–10.8)

## 2015-07-01 LAB — BASIC METABOLIC PANEL
Anion gap: 6 (ref 5–15)
BUN: 22 mg/dL — AB (ref 6–20)
CHLORIDE: 106 mmol/L (ref 101–111)
CO2: 28 mmol/L (ref 22–32)
CREATININE: 1.29 mg/dL — AB (ref 0.61–1.24)
Calcium: 8.7 mg/dL — ABNORMAL LOW (ref 8.9–10.3)
GFR calc Af Amer: >60 mL/min (ref >60)
GFR calc non Af Amer: 52 mL/min — ABNORMAL LOW (ref >60)
GLUCOSE: 109 mg/dL — AB (ref 65–99)
POTASSIUM: 4.1 mmol/L (ref 3.5–5.1)
Sodium: 140 mmol/L (ref 135–145)

## 2015-07-01 LAB — CBC
HEMATOCRIT: 38.7 % — AB (ref 39.0–52.0)
HEMOGLOBIN: 12.7 g/dL — AB (ref 13.0–17.0)
MCH: 29.8 pg (ref 26.0–34.0)
MCHC: 32.8 g/dL (ref 30.0–36.0)
MCV: 90.8 fL (ref 78.0–100.0)
Platelets: 184 10*3/uL (ref 150–400)
RBC: 4.26 MIL/uL (ref 4.22–5.81)
RDW: 14 % (ref 11.5–15.5)
WBC: 4.9 10*3/uL (ref 4.0–10.5)

## 2015-07-01 LAB — GLUCOSE, CAPILLARY
GLUCOSE-CAPILLARY: 113 mg/dL — AB (ref 65–99)
Glucose-Capillary: 141 mg/dL — ABNORMAL HIGH (ref 65–99)
Glucose-Capillary: 149 mg/dL — ABNORMAL HIGH (ref 65–99)

## 2015-07-01 LAB — HEMOGLOBIN A1C
Hgb A1c MFr Bld: 11.3 % — ABNORMAL HIGH (ref 4.8–5.6)
Mean Plasma Glucose: 278 mg/dL

## 2015-07-01 MED ORDER — SULFAMETHOXAZOLE-TRIMETHOPRIM 800-160 MG PO TABS: 1.0000 | ORAL_TABLET | Freq: Two times a day (BID) | ORAL | Status: DC

## 2015-07-01 NOTE — Discharge Instructions (Signed)
Keep wound clean and dry with soap and water. °

## 2015-07-01 NOTE — Consult Note (Signed)
Reason for Consult: Superficial abscess, right knee Referring Physician: Hospitalist  Christopher Padilla is an 75 y.o. male.  HPI: Patient is a 75 year old white male with multiple medical problems and peripheral vascular disease, status post bilateral below-the-knee amputations in the past who has had difficulty with his right lower extremity prosthesis. He has had intermittent pain with this. He was seen in his primary care physician's office for a bulla over the right knee With surrounding erythema. He was sent to the emergency room for admission and IV antibiotics. This morning, he states he feels fine and agrees that this is due to an ill fitting right lower leg prosthesis. He was going to see the prosthetic company early next week.  Past Medical History  Diagnosis Date  . CAD (coronary artery disease)     S/P CABG, Feb 2003, LIMA to the LAD, left free radial artery to an obtuse marginal, spahenous vein graft to diagonal, saphenous vein graft to RCA with endarterectomy  . S/P below knee amputation ([place]) 2001, 2004    bilaterally  . Cerebrovascular disease     MRA in 2005 with 75% stenosis in distal right vertebral artery and 75% stenosis greater in distal cervical internal carotid artery on teh right, severe bilateral disease and MRA of the extracranial circulation, high grade stenosis of the distal right internal carotid artery at the junction of the cervical internal carotid artery of the skull base  . Diabetes mellitus   . Hyperlipidemia   . Hypothyroidism   . History of tobacco use     Quit in 1990  . Myocardial infarction ([place]) 2002  . GERD (gastroesophageal reflux disease)   . Arthritis   . Cataract   . Complete heart block ([place]) 05/14/2014    St. Jude BiV PM (serial number [id number]) pacemaker  . CKD stage 3 due to type 2 diabetes mellitus ([place])   . Osteopenia 2016  . Hypertension   . Collapsed lung     Past Surgical History  Procedure Laterality Date  . Leg amputation below  knee      Bilateral  . Arterial bypass surgry      Left; left femoral to posterior tibial bypass gracting, left femoral artery and deep femoral artery endarterectomy with vein patch angioplasty of the common femoral arter and deep femoral artery 06/2005, right fremoral popliteal bypass, right iliac artery restent, bilateral below knee amputations  . Thoracotomy      With drainage of a hemathroax and decortication of fibrothorax  . Coronary artery bypass graft  2002    By Ivin Poot, MD  . Cataract extraction      Bilateral  . Carpal tunnel release      Right  . Amputation  10/17/2011    Procedure: AMPUTATION DIGIT;  Surgeon: Newt Minion, MD;  Location: [place];  Service: Orthopedics;  Laterality: Left;  Amputation Left Index Finger at PIP Joint  . Finger surgery Right     pointer  . Left heart catheterization with coronary angiogram N/A 05/10/2014    Procedure: LEFT HEART CATHETERIZATION WITH CORONARY ANGIOGRAM;  Surgeon: Burnell Blanks, MD;  Location: [facility] CATH LAB;  Service: Cardiovascular;  Laterality: N/A;  . Temporary pacemaker insertion N/A 05/14/2014    Procedure: TEMPORARY PACEMAKER INSERTION;  Surgeon: Blane Ohara, MD;  Location: [facility] CATH LAB;  Service: Cardiovascular;  Laterality: N/A;  . Bi-ventricular pacemaker insertion N/A 05/16/2014    Procedure: BI-VENTRICULAR PACEMAKER INSERTION (CRT-P);  Surgeon: Evans Lance, MD; St.  Jude BiV PM (serial number [id number]) pacemaker  . Eye surgery    . Insert / replace / remove pacemaker      Family History  Problem Relation Age of Onset  . Cancer Mother     brain and lung  . Diabetes Father 65  . Coronary artery disease Father   . Colon cancer Neg Hx   . Diabetes Sister   . Diabetes Brother   . Diabetes Son     Social History:  reports that he quit smoking about 27 years ago. His smoking use included Cigarettes. He started smoking about 60 years ago. He smoked 1.00 pack per day. He has never used smokeless tobacco. He  reports that he does not drink alcohol or use illicit drugs.  Allergies:  Allergies  Allergen Reactions  . Codeine     hallucinations  . Ciprofloxacin Diarrhea  . Nsaids Other (See Comments)    GI upset    Medications: I have reviewed the patient's current medications.  Results for orders placed or performed during the hospital encounter of 06/30/15 (from the past 48 hour(s))  MRSA PCR Screening     Status: None   Collection Time: 06/30/15  5:19 PM  Result Value Ref Range   MRSA by PCR NEGATIVE NEGATIVE    Comment:        The GeneXpert MRSA Assay (FDA approved for NASAL specimens only), is one component of a comprehensive MRSA colonization surveillance program. It is not intended to diagnose MRSA infection nor to guide or monitor treatment for MRSA infections.   Comprehensive metabolic panel     Status: Abnormal   Collection Time: 06/30/15  5:24 PM  Result Value Ref Range   Sodium 136 135 - 145 mmol/L   Potassium 4.8 3.5 - 5.1 mmol/L   Chloride 103 101 - 111 mmol/L   CO2 26 22 - 32 mmol/L   Glucose, Bld 259 (H) 65 - 99 mg/dL   BUN 26 (H) 6 - 20 mg/dL   Creatinine, Ser 1.39 (H) 0.61 - 1.24 mg/dL   Calcium 8.7 (L) 8.9 - 10.3 mg/dL   Total Protein 7.4 6.5 - 8.1 g/dL   Albumin 3.6 3.5 - 5.0 g/dL   AST 20 15 - 41 U/L   ALT 16 (L) 17 - 63 U/L   Alkaline Phosphatase 140 (H) 38 - 126 U/L   Total Bilirubin 0.5 0.3 - 1.2 mg/dL   GFR calc non Af Amer 48 (L) >60 mL/min   GFR calc Af Amer 55 (L) >60 mL/min    Comment: (NOTE) The eGFR has been calculated using the CKD EPI equation. This calculation has not been validated in all clinical situations. eGFR's persistently <60 mL/min signify possible Chronic Kidney Disease.    Anion gap 7 5 - 15  CBC with Differential/Platelet     Status: None   Collection Time: 06/30/15  5:24 PM  Result Value Ref Range   WBC 6.0 4.0 - 10.5 K/uL   RBC 4.32 4.22 - 5.81 MIL/uL   Hemoglobin 13.1 13.0 - 17.0 g/dL   HCT 39.3 39.0 - 52.0 %   MCV  91.0 78.0 - 100.0 fL   MCH 30.3 26.0 - 34.0 pg   MCHC 33.3 30.0 - 36.0 g/dL   RDW 13.9 11.5 - 15.5 %   Platelets 187 150 - 400 K/uL   Neutrophils Relative % 58 %   Neutro Abs 3.5 1.7 - 7.7 K/uL   Lymphocytes Relative 29 %   Lymphs  Abs 1.8 0.7 - 4.0 K/uL   Monocytes Relative 10 %   Monocytes Absolute 0.6 0.1 - 1.0 K/uL   Eosinophils Relative 3 %   Eosinophils Absolute 0.2 0.0 - 0.7 K/uL   Basophils Relative 0 %   Basophils Absolute 0.0 0.0 - 0.1 K/uL  Hemoglobin A1c     Status: Abnormal   Collection Time: 06/30/15  5:24 PM  Result Value Ref Range   Hgb A1c MFr Bld 11.3 (H) 4.8 - 5.6 %    Comment: (NOTE)         Pre-diabetes: 5.7 - 6.4         Diabetes: >6.4         Glycemic control for adults with diabetes: <7.0    Mean Plasma Glucose 278 mg/dL    Comment: (NOTE) Performed At: [facility] [address] [place], [place] [id number] Lindon Romp MD [id number]   Glucose, capillary     Status: Abnormal   Collection Time: 06/30/15  6:44 PM  Result Value Ref Range   Glucose-Capillary 218 (H) 65 - 99 mg/dL   Comment 1 Notify RN   Glucose, capillary     Status: Abnormal   Collection Time: 06/30/15  9:27 PM  Result Value Ref Range   Glucose-Capillary 269 (H) 65 - 99 mg/dL  CBC     Status: Abnormal   Collection Time: 07/01/15  6:31 AM  Result Value Ref Range   WBC 4.9 4.0 - 10.5 K/uL   RBC 4.26 4.22 - 5.81 MIL/uL   Hemoglobin 12.7 (L) 13.0 - 17.0 g/dL   HCT 38.7 (L) 39.0 - 52.0 %   MCV 90.8 78.0 - 100.0 fL   MCH 29.8 26.0 - 34.0 pg   MCHC 32.8 30.0 - 36.0 g/dL   RDW 14.0 11.5 - 15.5 %   Platelets 184 150 - 400 K/uL  Basic metabolic panel     Status: Abnormal   Collection Time: 07/01/15  6:31 AM  Result Value Ref Range   Sodium 140 135 - 145 mmol/L   Potassium 4.1 3.5 - 5.1 mmol/L   Chloride 106 101 - 111 mmol/L   CO2 28 22 - 32 mmol/L   Glucose, Bld 109 (H) 65 - 99 mg/dL   BUN 22 (H) 6 - 20 mg/dL   Creatinine, Ser 1.29 (H) 0.61 - 1.24 mg/dL   Calcium  8.7 (L) 8.9 - 10.3 mg/dL   GFR calc non Af Amer 52 (L) >60 mL/min   GFR calc Af Amer >60 >60 mL/min    Comment: (NOTE) The eGFR has been calculated using the CKD EPI equation. This calculation has not been validated in all clinical situations. eGFR's persistently <60 mL/min signify possible Chronic Kidney Disease.    Anion gap 6 5 - 15  Glucose, capillary     Status: Abnormal   Collection Time: 07/01/15  9:09 AM  Result Value Ref Range   Glucose-Capillary 113 (H) 65 - 99 mg/dL    Dg Knee 1-2 Views Right  06/30/2015  CLINICAL DATA:  Cellulitis.  Abscess anterior to the right knee. EXAM: RIGHT KNEE - 1-2 VIEW COMPARISON:  None. FINDINGS: A a right below-the-knee amputation is present. The knee is located. There is no significant effusion. There is moderate prepatellar soft tissue swelling. No focal osseous abnormality is present. Atherosclerotic calcifications are present within the femoral and popliteal artery. IMPRESSION: 1. Prominent prepatellar soft tissue swelling compatible with cellulitis. 2. No underlying osseous destruction. 3. Below the knee amputation.  4. Atherosclerosis. Electronically Signed   By: San Morelle M.D.   On: 06/30/2015 20:20    ROS: See chart Blood pressure 169/76, pulse 80, temperature 97.5 F (36.4 C), temperature source Oral, resp. rate 18, height _0  (1.727 m), weight 66.089 kg (145 lb 11.2 oz), SpO2 96 %. Physical Exam: Pleasant white male in no acute distress. Right lower extremity, status post right below-knee amputation. A small less than 1 cm is present anteriorly around the right knee. Slight erythema is present. Incision and drainage revealed a minimal amount of purulent fluid.  Assessment/Plan: Impression: Superficial abscess due to ill fitting right lower extremity prosthesis. Plan: Patient would like to go home, which is fine with me. Suggest ten-day course of Bactrim. Patient instructed to keep the wound clean and dry twice a day. He is to  see the prosthetic company early next week. May follow-up in my office in one week.  Jakki Doughty A 07/01/2015, 12:30 PM

## 2015-07-01 NOTE — Discharge Summary (Signed)
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 Patient discharged home in a stable condition with instructions to follow with PCP in 2 weeks and to follow with general surgery at discharge in approximately 1 week.  Diet recommendation: Heart healthy diet and modify carbohydrates diet  Filed Weights   06/30/15 1746  Weight: 66.089 kg (145 lb 11.2 oz)    History of present illness:  As per Dr. Caryn Section on 2/24 75 y.o. male with a history of bilateral lower extremity BKA, ischemic cardiomyopathy, heart block, status post biventricular pacemaker, insulin requiring diabetes mellitus, and chronic kidney disease, who presents from Dr. Tawanna Sat office with a chief complaint of right knee pain and swelling. Patient's symptoms started approximately 1 week ago as he developed some discomfort over his right knee which progressed to redness and a blister that formed on top of the knee joint a couple days ago. He reports having trouble with his new prosthesis and has been getting adjustments made so that the prosthesis can properly fit. He tried to maneuver the prosthesis with adding a sock and putting a  Band-Aid over the red and tender, but it did not work. The knee has become more uncomfortable, but he does not believe the infection is within the knee joint itself. He denies subjective fever or chills, presyncopal symptoms, nausea, vomiting, diarrhea, chest pain, shortness of breath, or swelling of his stumps bilaterally. He presented to Dr. Tawanna Sat office today. Dr. Laurance Flatten was concerned about worsening of the infection without IV antibiotics, so the patient is being directly admitted for further evaluation and management.  Laboratory studies ordered and results are noted for a normal WBC of 6.0, BUN of 26, creatinine of 1.39, and glucose of 259. He is afebrile and hemodynamically stable, but moderately hypertensive.  Hospital Course:  1-cellulitis of right knee with a small abscess: -Patient nontoxic; afebrile and with normal WBCs -Status post IND by general surgery and recommendations for Bactrim twice a day for 10 days. -Instructions for wound care provided by general surgery to the patient and they will follow him in about 1 week as an outpatient. -Patient received 36 hours of IV antibiotics.  2-ischemic cardiomyopathy: Stable and compensated -Patient denies chest pain or shortness of breath -Will continue home medication regimen. -Advised to follow low sodium diet and to follow weight   3-history of bilateral BKA -With some issues and accommodating his prosthesis on the right side. Most likely etiology for his current a small abscess and cellulitis. -Currently plan is not to use his prosthesis in order to allow cellulitis to heal and he will follow for with prosthesis provider, for better fitting   4-essential hypertension and diabetes: Appears to be stable and well control. -Will continue home medication regimen -Patient has been advised to  follow a low-sodium and modify carbohydrates diet.  5-chronic kidney disease stage III: -Stable and at baseline. -Appears to be secondary to  hypertension and diabetes -Will recommend outpatient follow-up, especially with use of Bactrim at discharge.  6-hypothyroidism: Stable. -Continue Synthroid   Procedures:  I & D on right knee small superficial abscess 2/25  Consultations:  Gen. surgery  Discharge Exam: Filed Vitals:   06/30/15 2124 07/01/15 0547  BP: 161/77 169/76  Pulse: 77 80  Temp: 98 F (36.7 C) 97.5 F (36.4 C)  Resp: 18 18    General: Afebrile, denies any chest pain, shortness of breath, nausea, vomiting or any other acute complaints. Right knee status post I and D by general surgery at bedside. Minimal pain. Cardiovascular: S1 and S2, no rubs, no gallops Respiratory: Good air movement bilaterally, no wheezing, no crackles  Discharge Instructions   Discharge Instructions    Diet - low sodium heart healthy    Complete by:  As directed      Discharge instructions    Complete by:  As directed   Follow up with PCP in 2 weeks Follow up instructions from general surgeon and outpatient appointment with him as instructed Keep yourself well hydrated Take medications as prescribed          Current Discharge Medication List    START taking these medications   Details  sulfamethoxazole-trimethoprim (BACTRIM DS,SEPTRA DS) 800-160 MG tablet Take 1 tablet by mouth 2 (two) times daily. Qty: 10 tablet, Refills: 0      CONTINUE these medications which have NOT CHANGED   Details  aspirin 81 MG tablet Take 81 mg by mouth daily.      carvedilol (COREG CR) 10 MG 24 hr capsule Take 1 capsule (10 mg total) by mouth daily. Qty: 30 capsule, Refills: 1    Chlorphen-Pseudoephed-APAP (COLD MEDICINE PLUS) 2-30-325 MG CAPS Take 1-2 capsules by mouth daily as needed (for cold and cough).    Cholecalciferol (VITAMIN D PO) Take 1,000 Units by mouth daily.     gabapentin (NEURONTIN) 600 MG tablet TAKE 1 CAPSULE BY MOUTH THREE TIMES A DAY Qty: 90 tablet, Refills: 1    insulin aspart (NOVOLOG) 100 UNIT/ML injection  Inject 0-20 units as directed per sliding scale Qty: 30 mL, Refills: 6    insulin detemir (LEVEMIR) 100 UNIT/ML injection Inject 35 Units into the skin at bedtime.    levothyroxine (SYNTHROID, LEVOTHROID) 125 MCG tablet Take 1 tablet (125 mcg total) by mouth daily. Qty: 30 tablet, Refills: 1    nortriptyline (PAMELOR) 25 MG capsule Take 1 capsule (25 mg total) by mouth at bedtime. Qty: 30 capsule, Refills: 1    omeprazole (PRILOSEC) 20 MG capsule TAKE 1 CAPSULE BY MOUTH DAILY Qty: 30 capsule, Refills: 1    simvastatin (ZOCOR) 40 MG tablet TAKE 1 TABLET (40 MG TOTAL) BY MOUTH AT BEDTIME. Qty: 90 tablet, Refills: 3    tetrahydrozoline (EYE DROPS) 0.05 % ophthalmic solution Apply 1-2 drops to eye daily.    ACCU-CHEK SOFTCLIX LANCETS lancets Check BG 4Xdaily DX E11.65 Qty: 300 each, Refills: 1    Blood Glucose Monitoring Suppl (ACCU-CHEK AVIVA PLUS) W/DEVICE KIT Check BG 4XDaily DX E11.65 Qty: 1 kit, Refills: 0    glucose blood (ACCU-CHEK AVIVA PLUS) test strip Check BG 4xdaily DX Ell.65 Qty: 300 each, Refills: 1    Insulin Syringe-Needle U-100 (BD INSULIN SYRINGE ULTRAFINE) 31G X 15/64" 0.5 ML MISC Use once daily Qty: 100 each, Refills: 1  Allergies  Allergen Reactions  . Codeine     hallucinations  . Ciprofloxacin Diarrhea  . Nsaids Other (See Comments)    GI upset   Follow-up Information    Follow up with Jamesetta So, MD. Schedule an appointment as soon as possible for a visit on 07/11/2015.   Specialty:  General Surgery   Contact information:   [address] [place] [address] [phone]       Follow up with Redge Gainer, MD. Schedule an appointment as soon as possible for a visit in 2 weeks.   Specialty:  Family Medicine   Contact information:   [place] [address] [phone]        The results of significant diagnostics from this hospitalization (including imaging, microbiology, ancillary and laboratory) are listed  below for reference.    Significant Diagnostic Studies: Dg Knee 1-2 Views Right  06/30/2015  CLINICAL DATA:  Cellulitis.  Abscess anterior to the right knee. EXAM: RIGHT KNEE - 1-2 VIEW COMPARISON:  None. FINDINGS: A a right below-the-knee amputation is present. The knee is located. There is no significant effusion. There is moderate prepatellar soft tissue swelling. No focal osseous abnormality is present. Atherosclerotic calcifications are present within the femoral and popliteal artery. IMPRESSION: 1. Prominent prepatellar soft tissue swelling compatible with cellulitis. 2. No underlying osseous destruction. 3. Below the knee amputation. 4. Atherosclerosis. Electronically Signed   By: San Morelle M.D.   On: 06/30/2015 20:20    Microbiology: Recent Results (from the past 240 hour(s))  MRSA PCR Screening     Status: None   Collection Time: 06/30/15  5:19 PM  Result Value Ref Range Status   MRSA by PCR NEGATIVE NEGATIVE Final    Comment:        The GeneXpert MRSA Assay (FDA approved for NASAL specimens only), is one component of a comprehensive MRSA colonization surveillance program. It is not intended to diagnose MRSA infection nor to guide or monitor treatment for MRSA infections.      Labs: Basic Metabolic Panel:  Recent Labs Lab 06/30/15 1724 07/01/15 0631  NA 136 140  K 4.8 4.1  CL 103 106  CO2 26 28  GLUCOSE 259* 109*  BUN 26* 22*  CREATININE 1.39* 1.29*  CALCIUM 8.7* 8.7*   Liver Function Tests:  Recent Labs Lab 06/30/15 1724  AST 20  ALT 16*  ALKPHOS 140*  BILITOT 0.5  PROT 7.4  ALBUMIN 3.6   CBC:  Recent Labs Lab 06/30/15 1347 06/30/15 1724 07/01/15 0631  WBC 5.7 6.0 4.9  NEUTROABS 3.1 3.5  --   HGB  --  13.1 12.7*  HCT 38.8 39.3 38.7*  MCV 89 91.0 90.8  PLT 221 187 184   CBG:  Recent Labs Lab 06/30/15 1844 06/30/15 2127 07/01/15 0909  GLUCAP 218* 269* 113*    Signed:  Barton Dubois MD.  [facility] 07/01/2015,  2:15 PM

## 2015-07-01 NOTE — Progress Notes (Signed)
Pharmacy Antibiotic Note  Christopher Padilla is a 75 y.o. male admitted on 06/30/2015 with cellulitis.  Pharmacy has been consulted for Vancomycin dosing.  Plan:  Vancomycin 750mg  IV q12hrs  Check trough at steady state  Monitor labs, renal fxn, c/s  Height: 5\' 8"  (172.7 cm) Weight: 145 lb 11.2 oz (66.089 kg) IBW/kg (Calculated) : 68.4  Temp (24hrs), Avg:97.8 F (36.6 C), Min:97.4 F (36.3 C), Max:98.2 F (36.8 C)   Recent Labs Lab 06/30/15 1347 06/30/15 1724 07/01/15 0631  WBC 5.7 6.0 4.9  CREATININE  --  1.39* 1.29*    Estimated Creatinine Clearance: 45.5 mL/min (by C-G formula based on Cr of 1.29).    Allergies  Allergen Reactions  . Codeine     hallucinations  . Ciprofloxacin Diarrhea  . Nsaids Other (See Comments)    GI upset   Anti-infectives    Start     Dose/Rate Route Frequency Ordered Stop   07/01/15 0900  vancomycin (VANCOCIN) IVPB 750 mg/150 ml premix     750 mg 150 mL/hr over 60 Minutes Intravenous Every 12 hours 06/30/15 1920     06/30/15 2100  vancomycin (VANCOCIN) IVPB 1000 mg/200 mL premix     1,000 mg 200 mL/hr over 60 Minutes Intravenous  Once 06/30/15 1919 06/30/15 2229     Dose adjustments this admission: n/a  Results for orders placed or performed during the hospital encounter of 06/30/15  MRSA PCR Screening     Status: None   Collection Time: 06/30/15  5:19 PM  Result Value Ref Range Status   MRSA by PCR NEGATIVE NEGATIVE Final    Comment:        The GeneXpert MRSA Assay (FDA approved for NASAL specimens only), is one component of a comprehensive MRSA colonization surveillance program. It is not intended to diagnose MRSA infection nor to guide or monitor treatment for MRSA infections.    Thank you for allowing pharmacy to be a part of this patient's care.  Valrie Hart A 07/01/2015 10:15 AM

## 2015-07-03 DIAGNOSIS — L03115 Cellulitis of right lower limb: Secondary | ICD-10-CM | POA: Diagnosis not present

## 2015-07-04 LAB — ANAEROBIC AND AEROBIC CULTURE

## 2015-07-06 DIAGNOSIS — T148 Other injury of unspecified body region: Secondary | ICD-10-CM | POA: Diagnosis not present

## 2015-07-12 ENCOUNTER — Other Ambulatory Visit: Payer: Self-pay | Admitting: Family Medicine

## 2015-07-31 ENCOUNTER — Ambulatory Visit (INDEPENDENT_AMBULATORY_CARE_PROVIDER_SITE_OTHER): Payer: PPO | Admitting: Family Medicine

## 2015-07-31 ENCOUNTER — Encounter: Payer: Self-pay | Admitting: Family Medicine

## 2015-07-31 VITALS — BP 117/56 | HR 75 | Temp 97.3°F | Ht 68.0 in | Wt 158.0 lb

## 2015-07-31 DIAGNOSIS — L089 Local infection of the skin and subcutaneous tissue, unspecified: Secondary | ICD-10-CM

## 2015-07-31 DIAGNOSIS — E1122 Type 2 diabetes mellitus with diabetic chronic kidney disease: Secondary | ICD-10-CM

## 2015-07-31 DIAGNOSIS — E785 Hyperlipidemia, unspecified: Secondary | ICD-10-CM | POA: Diagnosis not present

## 2015-07-31 DIAGNOSIS — L03115 Cellulitis of right lower limb: Secondary | ICD-10-CM

## 2015-07-31 DIAGNOSIS — E034 Atrophy of thyroid (acquired): Secondary | ICD-10-CM | POA: Diagnosis not present

## 2015-07-31 DIAGNOSIS — E1139 Type 2 diabetes mellitus with other diabetic ophthalmic complication: Secondary | ICD-10-CM

## 2015-07-31 DIAGNOSIS — I10 Essential (primary) hypertension: Secondary | ICD-10-CM

## 2015-07-31 DIAGNOSIS — N400 Enlarged prostate without lower urinary tract symptoms: Secondary | ICD-10-CM

## 2015-07-31 DIAGNOSIS — E038 Other specified hypothyroidism: Secondary | ICD-10-CM

## 2015-07-31 DIAGNOSIS — N183 Chronic kidney disease, stage 3 unspecified: Secondary | ICD-10-CM

## 2015-07-31 DIAGNOSIS — Z1211 Encounter for screening for malignant neoplasm of colon: Secondary | ICD-10-CM

## 2015-07-31 DIAGNOSIS — Z794 Long term (current) use of insulin: Secondary | ICD-10-CM

## 2015-07-31 DIAGNOSIS — E559 Vitamin D deficiency, unspecified: Secondary | ICD-10-CM | POA: Diagnosis not present

## 2015-07-31 DIAGNOSIS — I872 Venous insufficiency (chronic) (peripheral): Secondary | ICD-10-CM

## 2015-07-31 DIAGNOSIS — IMO0001 Reserved for inherently not codable concepts without codable children: Secondary | ICD-10-CM

## 2015-07-31 NOTE — Patient Instructions (Addendum)
Medicare Annual Wellness Visit  [facility] and the medical providers at [facility] strive to bring you the best medical care.  In doing so we not only want to address your current medical conditions and concerns but also to detect new conditions early and prevent illness, disease and health-related problems.    Medicare offers a yearly Wellness Visit which allows our clinical staff to assess your need for preventative services including immunizations, lifestyle education, counseling to decrease risk of preventable diseases and screening for fall risk and other medical concerns.    This visit is provided free of charge (no copay) for all Medicare recipients. The clinical pharmacists at [facility] have begun to conduct these Wellness Visits which will also include a thorough review of all your medications.    As you primary medical provider recommend that you make an appointment for your Annual Wellness Visit if you have not done so already this year.  You may set up this appointment before you leave today or you may call back ([phone]) and schedule an appointment.  Please make sure when you call that you mention that you are scheduling your Annual Wellness Visit with the clinical pharmacist so that the appointment may be made for the proper length of time.     Continue current medications. Continue good therapeutic lifestyle changes which include good diet and exercise. Fall precautions discussed with patient. If an FOBT was given today- please return it to our front desk. If you are over 66 years old - you may need Prevnar 13 or the adult Pneumonia vaccine.  **Flu shots are available--- please call and schedule a FLU-CLINIC appointment**  After your visit with [place] today you will receive a survey in the mail or online from [employer] regarding your care with [place]. Please take a moment to fill this out. Your feedback is very  important to [place] as you can help [place] better understand your patient needs as well as improve your experience and satisfaction. WE CARE ABOUT YOU!!!   We will try to get the appointment with the vascular surgeon sooner rather than later because of the carotid Dopplers and this venous insufficiency in the left hand Continue to do better with controlling diet with diet and exercise and insulin

## 2015-07-31 NOTE — Progress Notes (Signed)
Subjective:    Patient ID: Christopher Padilla, male    DOB: 04-Apr-1941, 76 y.o.   MRN: [id number]  HPI Pt here for follow up and management of chronic medical problems which includes hypertension, hyperlipidemia, diabetes, and hypothyroid. He is taking medications regularly.The patient was recently hospitalized because of cellulitis of the right knee. He was admitted and given IV antibiotics. He does complain of some discoloration in his hand today. He will be given an FOBT to return. He will return to clinic for fasting lab work. 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 He seems to be excited about this. He has no complaints of chest pain or shortness of breath. He has no GI complaints and no trouble passing his water. The cellulitis of his right knee has healed well and he is no longer taking antibiotics for this. He has seen the clinical pharmacists because of the elevated hemoglobin A1c and has made adjustments in his insulin because of that visit. He is also try to make some adjustments in his diet.     Patient Active Problem List   Diagnosis Date Noted  . Cellulitis 07/01/2015  . Cellulitis of right knee 06/30/2015  . Essential hypertension 06/30/2015  . Biventricular cardiac pacemaker in situ 08/22/2014  . Osteopenia 07/25/2014  . CKD stage 3 due to type 2 diabetes mellitus ([place])   . Cardiomyopathy, ischemic   . Coronary artery disease involving native coronary artery of native heart without angina pectoris   . Complete heart block ([place]) 05/14/2014  . Syncope 04/18/2014  . Accelerated hypertension 04/18/2014  . Hypertension associated with diabetes ([place]) 04/18/2014  . Hx of BKA, bilateral 05/03/2013  . Mitral regurgitation 07/03/2011  . Hypothyroidism 11/05/2008  . 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  Hyperlipidemia LDL goal <70 11/05/2008  . CEREBROVASCULAR DISEASE 11/05/2008  . TOBACCO ABUSE, HX OF 11/05/2008  . Coronary atherosclerosis 10/04/2008  . HEART FAILURE, COMBINED UNSPEC. 10/04/2008   Outpatient Encounter Prescriptions as of 07/31/2015  Medication Sig  . ACCU-CHEK SOFTCLIX LANCETS lancets Check BG 4Xdaily DX E11.65  . aspirin 81 MG tablet Take 81 mg by mouth daily.    . Blood Glucose Monitoring Suppl (ACCU-CHEK AVIVA PLUS) W/DEVICE KIT Check BG 4XDaily DX E11.65  . carvedilol (COREG CR) 10 MG 24 hr capsule Take 1 capsule (10 mg total) by mouth daily.  . Chlorphen-Pseudoephed-APAP (COLD MEDICINE PLUS) 2-30-325 MG CAPS Take 1-2 capsules by mouth daily as needed (for cold and cough).  . Cholecalciferol (VITAMIN D PO) Take 1,000 Units by mouth daily.   Marland Kitchen gabapentin (NEURONTIN) 600 MG tablet TAKE 1 TABLET THREE TIMES A DAY  . glucose blood (ACCU-CHEK AVIVA PLUS) test strip Check BG 4xdaily DX Ell.65  . insulin aspart (NOVOLOG) 100 UNIT/ML injection Inject 0-20 units as directed per sliding scale (Patient taking differently: Inject 0-20 Units into the skin 3 (three) times daily with meals. Inject 0-20 units as directed per sliding scale)  . Insulin Syringe-Needle U-100 (BD INSULIN SYRINGE ULTRAFINE) 31G X 15/64" 0.5 ML MISC Use once daily  . LANTUS 100 UNIT/ML injection INJECT 4O UNITS SUBCUTANEOUSLY AT BEDTIME  . levothyroxine (SYNTHROID, LEVOTHROID) 125 MCG tablet TAKE 1 TABLET (125 MCG TOTAL) BY MOUTH DAILY.  . nortriptyline (PAMELOR) 25 MG capsule TAKE 1 CAPSULE (25 MG TOTAL) BY MOUTH AT BEDTIME.  Marland Kitchen omeprazole (  PRILOSEC) 20 MG capsule TAKE 1 CAPSULE BY MOUTH DAILY  . simvastatin (ZOCOR) 40 MG tablet TAKE 1 TABLET (40 MG TOTAL) BY MOUTH AT BEDTIME.  Marland Kitchen tetrahydrozoline (EYE DROPS) 0.05 % ophthalmic solution Apply 1-2 drops to eye daily.  . [DISCONTINUED] sulfamethoxazole-trimethoprim (BACTRIM DS,SEPTRA DS) 800-160 MG tablet Take 1 tablet by mouth 2 (two) times daily.  .  [DISCONTINUED] insulin detemir (LEVEMIR) 100 UNIT/ML injection Inject 35 Units into the skin at bedtime.   No facility-administered encounter medications on file as of 07/31/2015.      Review of Systems  Constitutional: Negative.   HENT: Negative.   Eyes: Negative.   Respiratory: Negative.   Cardiovascular: Negative.   Gastrointestinal: Negative.   Endocrine: Negative.   Genitourinary: Negative.   Musculoskeletal: Negative.   Skin: Negative.        Discoloration - left hand with numbness  Allergic/Immunologic: Negative.   Neurological: Negative.   Hematological: Negative.   Psychiatric/Behavioral: Negative.        Objective:   Physical Exam  Constitutional: He is oriented to person, place, and time. He appears well-developed and well-nourished. No distress.  HENT:  Head: Normocephalic and atraumatic.  Right Ear: External ear normal.  Left Ear: External ear normal.  Nose: Nose normal.  Mouth/Throat: Oropharynx is clear and moist. No oropharyngeal exudate.  Eyes: Conjunctivae and EOM are normal. Pupils are equal, round, and reactive to light. Right eye exhibits no discharge. Left eye exhibits no discharge. No scleral icterus.  Neck: Normal range of motion. Neck supple. No thyromegaly present.  Bilateral carotid bruits right greater than left  Cardiovascular: Normal rate, regular rhythm and normal heart sounds.   No murmur heard. The patient is a below the knee amputee bilaterally.  Pulmonary/Chest: Effort normal and breath sounds normal. No respiratory distress. He has no wheezes. He has no rales. He exhibits no tenderness.  Clear anteriorly and posteriorly  Abdominal: Soft. Bowel sounds are normal. He exhibits no mass. There is no tenderness. There is no rebound and no guarding.  Some abdominal gas but no obvious organ enlargement or bruits.  Genitourinary:  This was deferred today.  Musculoskeletal:  The patient is a bilateral amputee and has bilateral prostheses for  both lower extremities  Lymphadenopathy:    He has no cervical adenopathy.  Neurological: He is alert and oriented to person, place, and time.  Skin: Skin is warm and dry. No rash noted.  The cellulitis of the right knee has completely cleared and there is only some scar tissue present. The left hand redness has no warmth. There is decreased radial pulse present. With elevating the hand the color becomes normal. This is obviously a venous return problem. We will have him see the vascular surgeon when he has his carotid Dopplers done.  Psychiatric: He has a normal mood and affect. His behavior is normal. Judgment and thought content normal.  Nursing note and vitals reviewed.  BP 117/56 mmHg  Pulse 75  Temp(Src) 97.3 F (36.3 C) (Oral)  Ht 5' 8" (1.727 m)  Wt 158 lb (71.668 kg)  BMI 24.03 kg/m2        Assessment & Plan:  1. Hypothyroidism due to acquired atrophy of thyroid -Continue current treatment pending results of lab work - CBC with Differential/Platelet; Future - Thyroid Panel With TSH; Future  2. Hyperlipidemia LDL goal <70 -Continue current treatment pending results of lab work - NMR, lipoprofile; Future - CBC with Differential/Platelet; Future  3. CKD stage 3 due to type  2 diabetes mellitus ([place]) -Continue to avoid all anti-inflammatory medicines and keep blood sugar under as good a control as possible - BMP8+EGFR; Future - CBC with Differential/Platelet; Future  4. 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 Essential hypertension -The blood pressure is good today and he will continue with current treatment - BMP8+EGFR; Future - Hepatic function panel; Future - CBC with Differential/Platelet; Future  6. BPH (benign prostatic hyperplasia) -No  complaints with voiding. - CBC with Differential/Platelet; Future  7. Vitamin D deficiency -Continue vitamin D replacement pending results of lab work - CBC with Differential/Platelet; Future - VITAMIN D 25 Hydroxy (Vit-D Deficiency, Fractures); Future  8. Special screening for malignant neoplasms, colon - Fecal occult blood, imunochemical; Future  9. Skin infection -The right stump cellulitis has resolved  10. Cellulitis of right lower extremity -This is of the right stump and he has finished all antibiotics  11. Venous (peripheral) insufficiency -The discoloration in the hand resolves completely with elevation of the hand and this is obviously a venous return problem. We will have him see the vascular surgeon.  Patient Instructions                       Medicare Annual Wellness Visit  [facility] and the medical providers at [facility] strive to bring you the best medical care.  In doing so we not only want to address your current medical conditions and concerns but also to detect new conditions early and prevent illness, disease and health-related problems.    Medicare offers a yearly Wellness Visit which allows our clinical staff to assess your need for preventative services including immunizations, lifestyle education, counseling to decrease risk of preventable diseases and screening for fall risk and other medical concerns.    This visit is provided free of charge (no copay) for all Medicare recipients. The clinical pharmacists at [facility] have begun to conduct these Wellness Visits which will also include a thorough review of all your medications.    As you primary medical provider recommend that you make an appointment for your Annual Wellness Visit if you have not done so already this year.  You may set up this appointment before you leave today or you may call back ([phone]) and schedule an appointment.  Please make sure when  you call that you mention that you are scheduling your Annual Wellness Visit with the clinical pharmacist so that the appointment may be made for the proper length of time.     Continue current medications. Continue good therapeutic lifestyle changes which include good diet and exercise. Fall precautions discussed with patient. If an FOBT was given today- please return it to our front desk. If you are over 39 years old - you may need Prevnar 62 or the adult Pneumonia vaccine.  **Flu shots are available--- please call and schedule a FLU-CLINIC appointment**  After your visit with [place] today you will receive a survey in the mail or online from [employer] regarding your care with [place]. Please take a moment to fill this out. Your feedback is very important to [place] as you can help [place] better understand your patient needs as well as improve your experience and satisfaction. WE CARE ABOUT YOU!!!   We will try to get the  appointment with the vascular surgeon sooner rather than later because of the carotid Dopplers and this venous insufficiency in the left hand Continue to do better with controlling diet with diet and exercise and insulin   Arrie Senate MD

## 2015-08-01 ENCOUNTER — Telehealth: Payer: Self-pay

## 2015-08-01 DIAGNOSIS — L819 Disorder of pigmentation, unspecified: Secondary | ICD-10-CM

## 2015-08-01 DIAGNOSIS — R200 Anesthesia of skin: Secondary | ICD-10-CM

## 2015-08-01 NOTE — Telephone Encounter (Addendum)
rec'd call from Dr. Kathi Der office in [place].  Requested an earlier appt. With Dr. [name]; reported left hand discoloration of red and white blotchy appearance.  Also reported pt. C/o numbness of middle finger left hand.  Has f/u appt. For carotid disease in May, and Dr. Christell Constant would like the pt. To be seen sooner for the above symptoms.  Advised that our office scheduler will contact the pt. With appt. Inform.  Discussed with Dr. Hart [place].  Gave v.o. for left upper extremity arterial duplex.

## 2015-08-01 NOTE — Telephone Encounter (Signed)
LM for pt with appt dates/times

## 2015-08-04 ENCOUNTER — Inpatient Hospital Stay (INDEPENDENT_AMBULATORY_CARE_PROVIDER_SITE_OTHER)
Admission: RE | Admit: 2015-08-04 | Discharge: 2015-08-04 | Disposition: A | Discharge status: Home / Self Care | Payer: PPO | Source: Ambulatory Visit

## 2015-08-04 DIAGNOSIS — R208 Other disturbances of skin sensation: Secondary | ICD-10-CM

## 2015-08-04 DIAGNOSIS — L819 Disorder of pigmentation, unspecified: Secondary | ICD-10-CM | POA: Diagnosis not present

## 2015-08-04 DIAGNOSIS — R200 Anesthesia of skin: Secondary | ICD-10-CM

## 2015-08-08 ENCOUNTER — Ambulatory Visit (HOSPITAL_COMMUNITY)
Admission: RE | Admit: 2015-08-08 | Discharge: 2015-08-08 | Disposition: A | Discharge status: Home / Self Care | Payer: PPO | Source: Ambulatory Visit | Attending: Vascular Surgery | Admitting: Vascular Surgery

## 2015-08-08 ENCOUNTER — Other Ambulatory Visit: Payer: Self-pay | Admitting: Family Medicine

## 2015-08-08 ENCOUNTER — Encounter: Payer: Self-pay | Admitting: Vascular Surgery

## 2015-08-08 DIAGNOSIS — Z87891 Personal history of nicotine dependence: Secondary | ICD-10-CM | POA: Insufficient documentation

## 2015-08-08 DIAGNOSIS — E1122 Type 2 diabetes mellitus with diabetic chronic kidney disease: Secondary | ICD-10-CM | POA: Diagnosis not present

## 2015-08-08 DIAGNOSIS — L819 Disorder of pigmentation, unspecified: Secondary | ICD-10-CM | POA: Insufficient documentation

## 2015-08-08 DIAGNOSIS — R208 Other disturbances of skin sensation: Secondary | ICD-10-CM | POA: Insufficient documentation

## 2015-08-08 DIAGNOSIS — I442 Atrioventricular block, complete: Secondary | ICD-10-CM | POA: Insufficient documentation

## 2015-08-08 DIAGNOSIS — K219 Gastro-esophageal reflux disease without esophagitis: Secondary | ICD-10-CM | POA: Insufficient documentation

## 2015-08-08 DIAGNOSIS — I2510 Atherosclerotic heart disease of native coronary artery without angina pectoris: Secondary | ICD-10-CM | POA: Insufficient documentation

## 2015-08-08 DIAGNOSIS — I252 Old myocardial infarction: Secondary | ICD-10-CM | POA: Insufficient documentation

## 2015-08-08 DIAGNOSIS — I129 Hypertensive chronic kidney disease with stage 1 through stage 4 chronic kidney disease, or unspecified chronic kidney disease: Secondary | ICD-10-CM | POA: Diagnosis not present

## 2015-08-08 DIAGNOSIS — N183 Chronic kidney disease, stage 3 (moderate): Secondary | ICD-10-CM | POA: Diagnosis not present

## 2015-08-08 DIAGNOSIS — E785 Hyperlipidemia, unspecified: Secondary | ICD-10-CM | POA: Insufficient documentation

## 2015-08-08 DIAGNOSIS — Z950 Presence of cardiac pacemaker: Secondary | ICD-10-CM | POA: Insufficient documentation

## 2015-08-09 ENCOUNTER — Encounter: Payer: Self-pay | Admitting: Vascular Surgery

## 2015-08-09 ENCOUNTER — Ambulatory Visit (INDEPENDENT_AMBULATORY_CARE_PROVIDER_SITE_OTHER): Payer: PPO | Admitting: Vascular Surgery

## 2015-08-09 VITALS — BP 135/66 | HR 80 | Temp 98.4°F | Resp 16 | Ht 68.0 in | Wt 158.5 lb

## 2015-08-09 DIAGNOSIS — I7090 Unspecified atherosclerosis: Secondary | ICD-10-CM

## 2015-08-09 DIAGNOSIS — I708 Atherosclerosis of other arteries: Secondary | ICD-10-CM | POA: Diagnosis not present

## 2015-08-09 NOTE — Progress Notes (Signed)
[facility]  Patient name: Christopher Padilla MRN: [id number] DOB: 01-06-1941 Sex: male  REASON FOR CONSULT: Left hand discoloration and numbness of left hand. Referred by Dr. Redge Gainer.  HPI: Christopher Padilla is a 75 y.o. male, who I have seen in the remote past. I am unable to locate any old records but according to Epic he may have been seen last year in 2004. He underwent coronary revascularization in 2002 in the left radial artery was harvested. He states that about a year ago he began noticing some discoloration in his left hand. 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 This ultimately healed. He has had progressive discoloration of the left hand and was set up for a vascular consultation.  He describes paresthesias in the left hand. He also describes discoloration which improves when he elevates his hand. 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  His risk factors for vascular disease include diabetes, hypertension, and hyperlipidemia. He has a remote history of tobacco use. He denies any family history of premature cardiovascular disease.  He has undergone bilateral below-the-knee amputations.  He has stage III chronic kidney disease. GFR is 52. This was on 07/01/2015. Creatinine was 1.29.  Past Medical History  Diagnosis Date  . CAD (coronary artery disease)     S/P CABG, Feb 2003, LIMA to the LAD, left free radial artery to an obtuse marginal, spahenous vein graft to diagonal, saphenous vein graft to RCA with endarterectomy  . 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 Cerebrovascular disease     MRA in 2005 with 75% stenosis in distal right vertebral artery and 75% stenosis greater in distal cervical internal carotid artery on teh right, severe bilateral disease and MRA of the extracranial circulation, high grade stenosis of the distal right  internal carotid artery at the junction of the cervical internal carotid artery of the skull base  . Diabetes mellitus   . Hyperlipidemia   . Hypothyroidism   . History of tobacco use     Quit in 1990  . Myocardial infarction ([place]) 2002  . GERD (gastroesophageal reflux disease)   . Arthritis   . Cataract   . Complete heart block ([place]) 05/14/2014    St. Jude BiV PM (serial number [id number]) pacemaker  . CKD stage 3 due to type 2 diabetes mellitus ([place])   . Osteopenia 2016  . Hypertension   . Collapsed lung     Family History  Problem Relation Age of Onset  . Cancer Mother     brain and lung  . Diabetes Father 29  . Coronary artery disease Father   . Colon cancer Neg Hx   . Diabetes Sister   . Diabetes Brother   . Diabetes Son     SOCIAL HISTORY: Social History   Social History  . Marital Status: Married    Spouse Name: N/A  . Number of Children: N/A  . Years of Education: N/A   Occupational History  . Retired    Social History Main Topics  . Smoking status: Former Smoker -- 1.00 packs/day    Types: Cigarettes    Start date: 11/04/1954    Quit date: 05/06/1988  . Smokeless tobacco: Never Used  . Alcohol Use: No  . Drug Use: No  . Sexual Activity: Not Currently   Other Topics Concern  . Not on  file   Social History Narrative   Married with 2 children    Allergies  Allergen Reactions  . Codeine     hallucinations  . Ciprofloxacin Diarrhea  . Nsaids Other (See Comments)    GI upset    Current Outpatient Prescriptions  Medication Sig Dispense Refill  . ACCU-CHEK SOFTCLIX LANCETS lancets Check BG 4Xdaily DX E11.65 300 each 1  . aspirin 81 MG tablet Take 81 mg by mouth daily.      . Blood Glucose Monitoring Suppl (ACCU-CHEK AVIVA PLUS) W/DEVICE KIT Check BG 4XDaily DX E11.65 1 kit 0  . Cholecalciferol (VITAMIN D PO) Take 1,000 Units by mouth daily.     Marland Kitchen COREG CR 10 MG 24 hr capsule TAKE 1 CAPSULE (10 MG TOTAL) BY MOUTH DAILY. 30 capsule 2  . gabapentin  (NEURONTIN) 600 MG tablet TAKE 1 TABLET THREE TIMES A DAY 90 tablet 1  . glucose blood (ACCU-CHEK AVIVA PLUS) test strip Check BG 4xdaily DX Ell.65 300 each 1  . insulin aspart (NOVOLOG) 100 UNIT/ML injection Inject 0-20 units as directed per sliding scale (Patient taking differently: Inject 0-20 Units into the skin 3 (three) times daily with meals. Inject 0-20 units as directed per sliding scale) 30 mL 6  . Insulin Syringe-Needle U-100 (BD INSULIN SYRINGE ULTRAFINE) 31G X 15/64" 0.5 ML MISC Use once daily 100 each 1  . LANTUS 100 UNIT/ML injection INJECT 4O UNITS SUBCUTANEOUSLY AT BEDTIME  2  . levothyroxine (SYNTHROID, LEVOTHROID) 125 MCG tablet TAKE 1 TABLET (125 MCG TOTAL) BY MOUTH DAILY. 30 tablet 1  . nortriptyline (PAMELOR) 25 MG capsule TAKE 1 CAPSULE (25 MG TOTAL) BY MOUTH AT BEDTIME. 30 capsule 1  . omeprazole (PRILOSEC) 20 MG capsule TAKE 1 CAPSULE BY MOUTH DAILY 30 capsule 1  . simvastatin (ZOCOR) 40 MG tablet TAKE 1 TABLET (40 MG TOTAL) BY MOUTH AT BEDTIME. 90 tablet 3  . tetrahydrozoline (EYE DROPS) 0.05 % ophthalmic solution Apply 1-2 drops to eye daily.    . Chlorphen-Pseudoephed-APAP (COLD MEDICINE PLUS) 2-30-325 MG CAPS Take 1-2 capsules by mouth daily as needed (for cold and cough). Reported on 08/09/2015     No current facility-administered medications for this visit.    REVIEW OF SYSTEMS:  _0  denotes positive finding, _1  denotes negative finding Cardiac  Comments:  Chest pain or chest pressure:    Shortness of breath upon exertion:    Short of breath when lying flat:    Irregular heart rhythm:        Vascular    Pain in calf, thigh, or hip brought on by ambulation:    Pain in feet at night that wakes you up from your sleep:     Blood clot in your veins:    Leg swelling:         Pulmonary    Oxygen at home:    Productive cough:     Wheezing:         Neurologic    Sudden weakness in arms or legs:     Sudden numbness in arms or legs:     Sudden onset of  difficulty speaking or slurred speech:    Temporary loss of vision in one eye:     Problems with dizziness:         Gastrointestinal    Blood in stool:     Vomited blood:         Genitourinary    Burning when urinating:     Blood  in urine:        Psychiatric    Major depression:         Hematologic    Bleeding problems:    Problems with blood clotting too easily:        Skin    Rashes or ulcers:        Constitutional    Fever or chills:      PHYSICAL EXAM: Filed Vitals:   08/09/15 1550  BP: 135/66  Pulse: 80  Temp: 98.4 F (36.9 C)  TempSrc: Oral  Resp: 16  Height: _0  (1.727 m)  Weight: 158 lb 8 oz (71.895 kg)  SpO2: 95%    GENERAL: The patient is a well-nourished male, in no acute distress. The vital signs are documented above. CARDIAC: There is a regular rate and rhythm.  VASCULAR: I do not detect carotid bruits. He has a radial ulnar and palmar arch signal in the left hand with the Doppler. These are monophasic. I cannot palpate a right femoral pulse. Does have a diminished but palpable left femoral pulse. PULMONARY: There is good air exchange bilaterally without wheezing or rales. ABDOMEN: Soft and non-tender with normal pitched bowel sounds.  MUSCULOSKELETAL: He has bilateral below-the-knee amputations. He is had previous amputation of the left index finger. NEUROLOGIC: No focal weakness or paresthesias are detected. SKIN: He does have some dependent rubor of the left hand. PSYCHIATRIC: The patient has a normal affect.  DATA:   UPPER EXTREMITY ARTERIAL DUPLEX: I have independently interpreted his upper extremity arterial duplex of the left upper extremity. This shows biphasic Doppler flow in the left subclavian and axillary arteries and monophasic flow from the proximal brachial artery and distal to that. The radial artery has been previously harvested. There is a monophasic ulnar signal with the Doppler proximally but no ulnar signal distally.  There  is plaque noted throughout the brachial artery on the left. There is no flow noted in the distal ulnar artery.  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 The radial artery has been harvested for previous CABG. The ulnar artery appears to occlude distally. I'm not sure if he has any options for revascularization however I have recommended that we proceed with an arteriogram. We will have him come in early in the morning and do him as the third case of that he can be hydrated in the morning. If he has any options for revascularization, then I will need to see him back in the office for bilateral great saphenous vein mapping of both thighs. Fortunately he is not a smoker. I have reviewed with the patient the indications for arteriography. In addition, I have reviewed the potential complications of arteriography including but not limited to: Bleeding, arterial injury, arterial thrombosis, dye action, renal insufficiency, or other unpredictable medical problems. I have explained to the patient that if we find disease amenable to angioplasty we could potentially address this at the same time. I have discussed the potential complications of angioplasty and stenting, including but not limited to: Bleeding, arterial thrombosis, arterial injury, dissection, or the need for surgical intervention.  Deitra Mayo [facility]: [phone]

## 2015-08-10 ENCOUNTER — Other Ambulatory Visit: Payer: Self-pay

## 2015-08-14 ENCOUNTER — Other Ambulatory Visit: Payer: PPO

## 2015-08-14 DIAGNOSIS — E038 Other specified hypothyroidism: Secondary | ICD-10-CM | POA: Diagnosis not present

## 2015-08-14 DIAGNOSIS — N183 Chronic kidney disease, stage 3 unspecified: Secondary | ICD-10-CM

## 2015-08-14 DIAGNOSIS — E559 Vitamin D deficiency, unspecified: Secondary | ICD-10-CM | POA: Diagnosis not present

## 2015-08-14 DIAGNOSIS — E034 Atrophy of thyroid (acquired): Secondary | ICD-10-CM

## 2015-08-14 DIAGNOSIS — E1122 Type 2 diabetes mellitus with diabetic chronic kidney disease: Secondary | ICD-10-CM

## 2015-08-14 DIAGNOSIS — N400 Enlarged prostate without lower urinary tract symptoms: Secondary | ICD-10-CM | POA: Diagnosis not present

## 2015-08-14 DIAGNOSIS — IMO0001 Reserved for inherently not codable concepts without codable children: Secondary | ICD-10-CM

## 2015-08-14 DIAGNOSIS — E1139 Type 2 diabetes mellitus with other diabetic ophthalmic complication: Secondary | ICD-10-CM

## 2015-08-14 DIAGNOSIS — I10 Essential (primary) hypertension: Secondary | ICD-10-CM | POA: Diagnosis not present

## 2015-08-14 DIAGNOSIS — Z794 Long term (current) use of insulin: Secondary | ICD-10-CM

## 2015-08-14 DIAGNOSIS — E785 Hyperlipidemia, unspecified: Secondary | ICD-10-CM

## 2015-08-14 LAB — BAYER DCA HB A1C WAIVED: HB A1C: 10.1 % — AB (ref <7.0)

## 2015-08-15 LAB — BMP8+EGFR
BUN / CREAT RATIO: 14 (ref 10–24)
BUN: 16 mg/dL (ref 8–27)
CALCIUM: 9.1 mg/dL (ref 8.6–10.2)
CO2: 25 mmol/L (ref 18–29)
Chloride: 98 mmol/L (ref 96–106)
Creatinine, Ser: 1.15 mg/dL (ref 0.76–1.27)
GFR, EST AFRICAN AMERICAN: 71 mL/min/{1.73_m2} (ref >59)
GFR, EST NON AFRICAN AMERICAN: 61 mL/min/{1.73_m2} (ref >59)
Glucose: 126 mg/dL — ABNORMAL HIGH (ref 65–99)
Potassium: 4.5 mmol/L (ref 3.5–5.2)
Sodium: 137 mmol/L (ref 134–144)

## 2015-08-15 LAB — CBC WITH DIFFERENTIAL/PLATELET
Basophils Absolute: 0 10*3/uL (ref 0.0–0.2)
Basos: 0 %
EOS (ABSOLUTE): 0.1 10*3/uL (ref 0.0–0.4)
EOS: 2 %
HEMATOCRIT: 41.5 % (ref 37.5–51.0)
HEMOGLOBIN: 13.4 g/dL (ref 12.6–17.7)
IMMATURE GRANS (ABS): 0 10*3/uL (ref 0.0–0.1)
IMMATURE GRANULOCYTES: 0 %
LYMPHS ABS: 1.5 10*3/uL (ref 0.7–3.1)
Lymphs: 30 %
MCH: 29.5 pg (ref 26.6–33.0)
MCHC: 32.3 g/dL (ref 31.5–35.7)
MCV: 91 fL (ref 79–97)
MONOCYTES: 10 %
Monocytes Absolute: 0.5 10*3/uL (ref 0.1–0.9)
Neutrophils Absolute: 2.9 10*3/uL (ref 1.4–7.0)
Neutrophils: 58 %
Platelets: 173 10*3/uL (ref 150–379)
RBC: 4.55 x10E6/uL (ref 4.14–5.80)
RDW: 14.6 % (ref 12.3–15.4)
WBC: 4.9 10*3/uL (ref 3.4–10.8)

## 2015-08-15 LAB — HEPATIC FUNCTION PANEL
ALBUMIN: 3.8 g/dL (ref 3.5–4.8)
ALT: 22 IU/L (ref 0–44)
AST: 20 IU/L (ref 0–40)
Alkaline Phosphatase: 142 IU/L — ABNORMAL HIGH (ref 39–117)
Bilirubin Total: 0.5 mg/dL (ref 0.0–1.2)
Bilirubin, Direct: 0.2 mg/dL (ref 0.00–0.40)
TOTAL PROTEIN: 6.9 g/dL (ref 6.0–8.5)

## 2015-08-15 LAB — NMR, LIPOPROFILE
Cholesterol: 94 mg/dL — ABNORMAL LOW (ref 100–199)
HDL CHOLESTEROL BY NMR: 43 mg/dL (ref >39)
HDL PARTICLE NUMBER: 25.7 umol/L — AB (ref >=30.5)
LDL Particle Number: 476 nmol/L (ref <1000)
LDL Size: 21 nm (ref >20.5)
LDL-C: 42 mg/dL (ref 0–99)
LP-IR Score: 31 (ref <=45)
Small LDL Particle Number: 186 nmol/L (ref <=527)
Triglycerides by NMR: 46 mg/dL (ref 0–149)

## 2015-08-15 LAB — THYROID PANEL WITH TSH
FREE THYROXINE INDEX: 2.2 (ref 1.2–4.9)
T3 UPTAKE RATIO: 33 % (ref 24–39)
T4 TOTAL: 6.6 ug/dL (ref 4.5–12.0)
TSH: 0.867 u[IU]/mL (ref 0.450–4.500)

## 2015-08-15 LAB — VITAMIN D 25 HYDROXY (VIT D DEFICIENCY, FRACTURES): Vit D, 25-Hydroxy: 38.5 ng/mL (ref 30.0–100.0)

## 2015-08-21 ENCOUNTER — Encounter (HOSPITAL_COMMUNITY)
Admission: RE | Disposition: A | Discharge status: Home / Self Care | Payer: Self-pay | Source: Ambulatory Visit | Attending: Vascular Surgery

## 2015-08-21 ENCOUNTER — Observation Stay (HOSPITAL_COMMUNITY)
Admission: RE | Admit: 2015-08-21 | Discharge: 2015-08-22 | Disposition: A | Discharge status: Home / Self Care | Payer: PPO | Source: Ambulatory Visit | Attending: Vascular Surgery | Admitting: Vascular Surgery

## 2015-08-21 ENCOUNTER — Encounter (HOSPITAL_COMMUNITY): Payer: Self-pay | Admitting: General Practice

## 2015-08-21 ENCOUNTER — Other Ambulatory Visit: Payer: Self-pay

## 2015-08-21 DIAGNOSIS — I739 Peripheral vascular disease, unspecified: Secondary | ICD-10-CM | POA: Diagnosis present

## 2015-08-21 DIAGNOSIS — Z951 Presence of aortocoronary bypass graft: Secondary | ICD-10-CM | POA: Diagnosis not present

## 2015-08-21 DIAGNOSIS — Z89512 Acquired absence of left leg below knee: Secondary | ICD-10-CM | POA: Insufficient documentation

## 2015-08-21 DIAGNOSIS — Z79899 Other long term (current) drug therapy: Secondary | ICD-10-CM | POA: Diagnosis not present

## 2015-08-21 DIAGNOSIS — I252 Old myocardial infarction: Secondary | ICD-10-CM | POA: Insufficient documentation

## 2015-08-21 DIAGNOSIS — E1122 Type 2 diabetes mellitus with diabetic chronic kidney disease: Secondary | ICD-10-CM | POA: Diagnosis not present

## 2015-08-21 DIAGNOSIS — E039 Hypothyroidism, unspecified: Secondary | ICD-10-CM | POA: Insufficient documentation

## 2015-08-21 DIAGNOSIS — Z794 Long term (current) use of insulin: Secondary | ICD-10-CM | POA: Diagnosis not present

## 2015-08-21 DIAGNOSIS — N183 Chronic kidney disease, stage 3 (moderate): Secondary | ICD-10-CM | POA: Insufficient documentation

## 2015-08-21 DIAGNOSIS — Z89511 Acquired absence of right leg below knee: Secondary | ICD-10-CM | POA: Diagnosis not present

## 2015-08-21 DIAGNOSIS — E785 Hyperlipidemia, unspecified: Secondary | ICD-10-CM | POA: Insufficient documentation

## 2015-08-21 DIAGNOSIS — I2510 Atherosclerotic heart disease of native coronary artery without angina pectoris: Secondary | ICD-10-CM | POA: Diagnosis not present

## 2015-08-21 DIAGNOSIS — I70298 Other atherosclerosis of native arteries of extremities, other extremity: Secondary | ICD-10-CM | POA: Diagnosis not present

## 2015-08-21 DIAGNOSIS — I129 Hypertensive chronic kidney disease with stage 1 through stage 4 chronic kidney disease, or unspecified chronic kidney disease: Secondary | ICD-10-CM | POA: Diagnosis not present

## 2015-08-21 DIAGNOSIS — I708 Atherosclerosis of other arteries: Secondary | ICD-10-CM | POA: Diagnosis not present

## 2015-08-21 DIAGNOSIS — Z87891 Personal history of nicotine dependence: Secondary | ICD-10-CM | POA: Diagnosis not present

## 2015-08-21 DIAGNOSIS — Z950 Presence of cardiac pacemaker: Secondary | ICD-10-CM | POA: Insufficient documentation

## 2015-08-21 DIAGNOSIS — Z7982 Long term (current) use of aspirin: Secondary | ICD-10-CM | POA: Insufficient documentation

## 2015-08-21 DIAGNOSIS — I70209 Unspecified atherosclerosis of native arteries of extremities, unspecified extremity: Secondary | ICD-10-CM

## 2015-08-21 HISTORY — DX: Personal history of other medical treatment: Z92.89

## 2015-08-21 HISTORY — DX: Type 2 diabetes mellitus without complications: E11.9

## 2015-08-21 HISTORY — DX: Presence of cardiac pacemaker: Z95.0

## 2015-08-21 HISTORY — PX: PERIPHERAL VASCULAR CATHETERIZATION: SHX172C

## 2015-08-21 HISTORY — DX: Personal history of other diseases of the digestive system: Z87.19

## 2015-08-21 HISTORY — DX: Personal history of peptic ulcer disease: Z87.11

## 2015-08-21 LAB — BASIC METABOLIC PANEL
ANION GAP: 10 (ref 5–15)
BUN: 18 mg/dL (ref 6–20)
CHLORIDE: 107 mmol/L (ref 101–111)
CO2: 22 mmol/L (ref 22–32)
Calcium: 8.7 mg/dL — ABNORMAL LOW (ref 8.9–10.3)
Creatinine, Ser: 1.46 mg/dL — ABNORMAL HIGH (ref 0.61–1.24)
GFR calc Af Amer: 52 mL/min — ABNORMAL LOW (ref >60)
GFR, EST NON AFRICAN AMERICAN: 45 mL/min — AB (ref >60)
GLUCOSE: 207 mg/dL — AB (ref 65–99)
POTASSIUM: 5 mmol/L (ref 3.5–5.1)
Sodium: 139 mmol/L (ref 135–145)

## 2015-08-21 LAB — GLUCOSE, CAPILLARY
GLUCOSE-CAPILLARY: 111 mg/dL — AB (ref 65–99)
GLUCOSE-CAPILLARY: 73 mg/dL (ref 65–99)
GLUCOSE-CAPILLARY: 186 mg/dL — AB (ref 65–99)
Glucose-Capillary: 237 mg/dL — ABNORMAL HIGH (ref 65–99)

## 2015-08-21 LAB — CBC
HEMATOCRIT: 41.4 % (ref 39.0–52.0)
HEMOGLOBIN: 13.2 g/dL (ref 13.0–17.0)
MCH: 29.9 pg (ref 26.0–34.0)
MCHC: 31.9 g/dL (ref 30.0–36.0)
MCV: 93.7 fL (ref 78.0–100.0)
Platelets: 139 10*3/uL — ABNORMAL LOW (ref 150–400)
RBC: 4.42 MIL/uL (ref 4.22–5.81)
RDW: 14.4 % (ref 11.5–15.5)
WBC: 4.8 10*3/uL (ref 4.0–10.5)

## 2015-08-21 LAB — POCT I-STAT, CHEM 8
BUN: 21 mg/dL — AB (ref 6–20)
CALCIUM ION: 1.15 mmol/L (ref 1.13–1.30)
Chloride: 102 mmol/L (ref 101–111)
Creatinine, Ser: 1.5 mg/dL — ABNORMAL HIGH (ref 0.61–1.24)
Glucose, Bld: 115 mg/dL — ABNORMAL HIGH (ref 65–99)
HCT: 41 % (ref 39.0–52.0)
HEMOGLOBIN: 13.9 g/dL (ref 13.0–17.0)
Potassium: 4.2 mmol/L (ref 3.5–5.1)
SODIUM: 141 mmol/L (ref 135–145)
TCO2: 26 mmol/L (ref 0–100)

## 2015-08-21 SURGERY — UPPER EXTREMITY ANGIOGRAPHY

## 2015-08-21 MED ORDER — SIMVASTATIN 40 MG PO TABS: 40.0000 mg | ORAL_TABLET | Freq: Every day | ORAL | Status: DC

## 2015-08-21 MED ORDER — ACETAMINOPHEN 325 MG PO TABS: 325.0000 mg | ORAL_TABLET | ORAL | Status: DC | PRN

## 2015-08-21 MED ORDER — MIDAZOLAM HCL 2 MG/2ML IJ SOLN
INTRAMUSCULAR | Status: AC
  Filled 2015-08-21: qty 2

## 2015-08-21 MED ORDER — MIDAZOLAM HCL 2 MG/2ML IJ SOLN
INTRAMUSCULAR | Status: DC | PRN
  Administered 2015-08-21: 0.5 mg via INTRAVENOUS

## 2015-08-21 MED ORDER — SODIUM CHLORIDE 0.9 % IV SOLN: 1.0000 mL/kg/h | INTRAVENOUS | Status: DC

## 2015-08-21 MED ORDER — GABAPENTIN 600 MG PO TABS
600.0000 mg | ORAL_TABLET | Freq: Three times a day (TID) | ORAL | Status: DC
  Administered 2015-08-21 – 2015-08-22 (×2): 600 mg via ORAL
  Filled 2015-08-21 (×2): qty 1

## 2015-08-21 MED ORDER — PANTOPRAZOLE SODIUM 40 MG PO TBEC
40.0000 mg | DELAYED_RELEASE_TABLET | Freq: Every day | ORAL | Status: DC
  Administered 2015-08-22: 40 mg via ORAL
  Filled 2015-08-21: qty 1

## 2015-08-21 MED ORDER — CARVEDILOL PHOSPHATE ER 10 MG PO CP24
10.0000 mg | ORAL_CAPSULE | Freq: Every day | ORAL | Status: DC
  Administered 2015-08-22: 10 mg via ORAL
  Filled 2015-08-21 (×2): qty 1

## 2015-08-21 MED ORDER — FENTANYL CITRATE (PF) 100 MCG/2ML IJ SOLN
INTRAMUSCULAR | Status: AC
  Filled 2015-08-21: qty 2

## 2015-08-21 MED ORDER — ASPIRIN EC 81 MG PO TBEC
81.0000 mg | DELAYED_RELEASE_TABLET | Freq: Every day | ORAL | Status: DC
  Administered 2015-08-22: 81 mg via ORAL
  Filled 2015-08-21 (×2): qty 1

## 2015-08-21 MED ORDER — LEVOTHYROXINE SODIUM 125 MCG PO TABS
125.0000 ug | ORAL_TABLET | Freq: Every day | ORAL | Status: DC
  Administered 2015-08-22: 125 ug via ORAL
  Filled 2015-08-21 (×2): qty 1

## 2015-08-21 MED ORDER — LIDOCAINE HCL (PF) 1 % IJ SOLN
INTRAMUSCULAR | Status: DC | PRN
  Administered 2015-08-21: 10 mL via SUBCUTANEOUS

## 2015-08-21 MED ORDER — ALUM & MAG HYDROXIDE-SIMETH 200-200-20 MG/5ML PO SUSP: 15.0000 mL | ORAL | Status: DC | PRN

## 2015-08-21 MED ORDER — GUAIFENESIN-DM 100-10 MG/5ML PO SYRP: 15.0000 mL | ORAL_SOLUTION | ORAL | Status: DC | PRN

## 2015-08-21 MED ORDER — PHENOL 1.4 % MT LIQD: 1.0000 | OROMUCOSAL | Status: DC | PRN

## 2015-08-21 MED ORDER — METOPROLOL TARTRATE 1 MG/ML IV SOLN: 2.0000 mg | INTRAVENOUS | Status: DC | PRN

## 2015-08-21 MED ORDER — HEPARIN (PORCINE) IN NACL 2-0.9 UNIT/ML-% IJ SOLN
INTRAMUSCULAR | Status: AC
  Filled 2015-08-21: qty 1000

## 2015-08-21 MED ORDER — HEPARIN (PORCINE) IN NACL 2-0.9 UNIT/ML-% IJ SOLN
INTRAMUSCULAR | Status: DC | PRN
  Administered 2015-08-21: 1000 mL via INTRA_ARTERIAL

## 2015-08-21 MED ORDER — FENTANYL CITRATE (PF) 100 MCG/2ML IJ SOLN
INTRAMUSCULAR | Status: DC | PRN
  Administered 2015-08-21: 25 ug via INTRAVENOUS

## 2015-08-21 MED ORDER — ENOXAPARIN SODIUM 30 MG/0.3ML ~~LOC~~ SOLN
30.0000 mg | SUBCUTANEOUS | Status: DC
  Filled 2015-08-21: qty 0.3

## 2015-08-21 MED ORDER — NORTRIPTYLINE HCL 25 MG PO CAPS
25.0000 mg | ORAL_CAPSULE | Freq: Every day | ORAL | Status: DC
  Administered 2015-08-21: 25 mg via ORAL
  Filled 2015-08-21: qty 1

## 2015-08-21 MED ORDER — ONDANSETRON HCL 4 MG/2ML IJ SOLN
INTRAMUSCULAR | Status: AC
  Administered 2015-08-21: 4 mg via INTRAVENOUS
  Filled 2015-08-21: qty 2

## 2015-08-21 MED ORDER — SODIUM CHLORIDE 0.9 % IV SOLN
INTRAVENOUS | Status: DC
  Administered 2015-08-21: 07:00:00 via INTRAVENOUS

## 2015-08-21 MED ORDER — IODIXANOL 320 MG/ML IV SOLN
INTRAVENOUS | Status: DC | PRN
  Administered 2015-08-21: 58 mL via INTRA_ARTERIAL

## 2015-08-21 MED ORDER — LABETALOL HCL 5 MG/ML IV SOLN: 10.0000 mg | INTRAVENOUS | Status: DC | PRN

## 2015-08-21 MED ORDER — ONDANSETRON HCL 4 MG/2ML IJ SOLN
4.0000 mg | Freq: Once | INTRAMUSCULAR | Status: AC
  Administered 2015-08-21: 4 mg via INTRAVENOUS

## 2015-08-21 MED ORDER — HYDRALAZINE HCL 20 MG/ML IJ SOLN: 5.0000 mg | INTRAMUSCULAR | Status: DC | PRN

## 2015-08-21 MED ORDER — INSULIN ASPART 100 UNIT/ML ~~LOC~~ SOLN
0.0000 [IU] | Freq: Three times a day (TID) | SUBCUTANEOUS | Status: DC
  Administered 2015-08-21: 3 [IU] via SUBCUTANEOUS
  Administered 2015-08-22: 2 [IU] via SUBCUTANEOUS
  Administered 2015-08-22: 3 [IU] via SUBCUTANEOUS

## 2015-08-21 MED ORDER — TRAMADOL HCL 50 MG PO TABS: 50.0000 mg | ORAL_TABLET | Freq: Four times a day (QID) | ORAL | Status: DC | PRN

## 2015-08-21 MED ORDER — ONDANSETRON HCL 4 MG/2ML IJ SOLN: 4.0000 mg | Freq: Four times a day (QID) | INTRAMUSCULAR | Status: DC | PRN

## 2015-08-21 MED ORDER — INSULIN GLARGINE 100 UNIT/ML ~~LOC~~ SOLN
32.0000 [IU] | Freq: Every day | SUBCUTANEOUS | Status: DC
  Administered 2015-08-21: 32 [IU] via SUBCUTANEOUS
  Filled 2015-08-21 (×2): qty 0.32

## 2015-08-21 MED ORDER — LIDOCAINE HCL (PF) 1 % IJ SOLN
INTRAMUSCULAR | Status: AC
  Filled 2015-08-21: qty 30

## 2015-08-21 MED ORDER — ACETAMINOPHEN 325 MG RE SUPP: 325.0000 mg | RECTAL | Status: DC | PRN

## 2015-08-21 MED ORDER — POTASSIUM CHLORIDE CRYS ER 20 MEQ PO TBCR: 20.0000 meq | EXTENDED_RELEASE_TABLET | Freq: Once | ORAL | Status: DC

## 2015-08-21 MED ORDER — SODIUM CHLORIDE 0.9 % IV SOLN
INTRAVENOUS | Status: DC
  Administered 2015-08-21: 20:00:00 via INTRAVENOUS

## 2015-08-21 SURGICAL SUPPLY — 12 items
CATH ANGIO 5F BER2 100CM (CATHETERS) ×4 IMPLANT
CATH ANGIO 5F PIGTAIL 100CM (CATHETERS) ×2 IMPLANT
CATH HEADHUNTER H1 5F 100CM (CATHETERS) ×2 IMPLANT
COVER PRB 48X5XTLSCP FOLD TPE (BAG) ×1 IMPLANT
COVER PROBE 5X48 (BAG) ×1
KIT MICROINTRODUCER STIFF 5F (SHEATH) ×2 IMPLANT
KIT PV (KITS) ×2 IMPLANT
SHEATH PINNACLE 5F 10CM (SHEATH) ×2 IMPLANT
SYR MEDRAD MARK V 150ML (SYRINGE) ×2 IMPLANT
TRANSDUCER W/STOPCOCK (MISCELLANEOUS) ×2 IMPLANT
TRAY PV CATH (CUSTOM PROCEDURE TRAY) ×2 IMPLANT
WIRE BENTSON .035X145CM (WIRE) ×2 IMPLANT

## 2015-08-21 NOTE — Progress Notes (Addendum)
After sitting him up he states he is "swimmy headed". He was laid down a felt better. Pt was sat back up after awhile and states he feels light headed. VSS, bp 154/77 p 77, glucose 187. Pt was laid down and I will monitor. Pt nauseated/ Dr [name] was paged. Page was returned, nausea order was given and was asked to page [place] to visit. PA was paged and returned call/ will visit pt.

## 2015-08-21 NOTE — Interval H&P Note (Signed)
History and Physical Interval Note:  08/21/2015 9:35 AM  Christopher Padilla  has presented today for surgery, with the diagnosis of left hand numbness  The various methods of treatment have been discussed with the patient and family. After consideration of risks, benefits and other options for treatment, the patient has consented to  Procedure(s): Upper Extremity Angiography (N/A) Aortic Arch Angiography (N/A) as a surgical intervention .  The patient's history has been reviewed, patient examined, no change in status, stable for surgery.  I have reviewed the patient's chart and labs.  Questions were answered to the patient's satisfaction.     [name]

## 2015-08-21 NOTE — Op Note (Signed)
   PATIENT: Christopher Padilla   MRN: [id number] DOB: [birth year]    DATE OF PROCEDURE: 08/21/2015  INDICATIONS: [name] is a 75 y.o. male ho presents with progressive ischemia of the left hand. I recommended arteriography in order to assess his options for revascularization.  PROCEDURE:  1. Ultrasound-guided access to the right common femoral artery 2. Arch aortogram 3. Selective catheterization of the left subclavian artery with left upper extremity runoff  SURGEON: Di Kindle. [name], MD, FACS  ANESTHESIA: local with sedation   EBL: minimal  TECHNIQUE: The patient was brought to the peripheral vascular lab. The period of conscious sedation began at (99:45 AM) and ended at (10:30 AM).  During that time period, I was present face-to-face 100% of the time.  The patient was administered (00.5 mg of Versed and 25 g of fentanyl). The patient's heart rate, blood pressure, and oxygen saturation were monitored by the nurse continuously during the procedure.  Both groins were prepped and draped in usual sterile fashion. Under ultrasound guidance, after the skin was anesthetized, the right common femoral artery was cannulated with a micropuncture needle and a micropuncture sheath introduced over the wire.This was exchanged for a 5 [place] sheath over a [employer] wire.  A long pigtail catheter was positioned in the ascending aortic arch.  At a 40 LAO projection arch aortogram was obtained.  I was then able to cannulate the left subclavian artery with a Berenstein to catheter and the wire was advanced into the subclavian artery. The catheter was advanced over the wire and selective left subclavian arteriogram obtained.  At the completion of the procedure the catheter was removed. The sheath was removed and pressure held for hemostasis. No immediate complications were noted.    FINDINGS:  1. The arch is widely patent without significant occlusive disease. The innominate artery, proximal right  subclavian artery, proximal right common carotid artery are patent. The left common carotid artery is patent. The left subclavian proximally is patent. 2. The axillary artery is patent. There is a focal 90% stenosis in the brachial artery just beneath the axilla. Below that the brachial artery is patent. The proximal radial and ulnar arteries are patent although the ulnar artery occludes in the proximal third of the forearm. The radial artery occludes in the distal third of the forearm. There is some reconstitution of the palmar arch only.  CLINICAL NOTE: His only option for improving blood flow to the left hand would be endarterectomy of the stenosis in the brachial artery with patch angioplasty. I will schedule this for 08/29/2015.   [name], MD, FACS [facility]  DATE OF DICTATION:   08/21/2015

## 2015-08-21 NOTE — Discharge Instructions (Signed)
Angiogram, Care After °Refer to this sheet in the next few weeks. These instructions provide you with information about caring for yourself after your procedure. Your health care provider may also give you more specific instructions. Your treatment has been planned according to current medical practices, but problems sometimes occur. Call your health care provider if you have any problems or questions after your procedure. °WHAT TO EXPECT AFTER THE PROCEDURE °After your procedure, it is typical to have the following: °· Bruising at the catheter insertion site that usually fades within 1-2 weeks. °· Blood collecting in the tissue (hematoma) that may be painful to the touch. It should usually decrease in size and tenderness within 1-2 weeks. °HOME CARE INSTRUCTIONS °· Take medicines only as directed by your health care provider. °· You may shower 24-48 hours after the procedure or as directed by your health care provider. Remove the bandage (dressing) and gently wash the site with plain soap and water. Pat the area dry with a clean towel. Do not rub the site, because this may cause bleeding. °· Do not take baths, swim, or use a hot tub until your health care provider approves. °· Check your insertion site every day for redness, swelling, or drainage. °· Do not apply powder or lotion to the site. °· Do not lift over 10 lb (4.5 kg) for 5 days after your procedure or as directed by your health care provider. °· Ask your health care provider when it is okay to: °¨ Return to work or school. °¨ Resume usual physical activities or sports. °¨ Resume sexual activity. °· Do not drive home if you are discharged the same day as the procedure. Have someone else drive you. °· You may drive 24 hours after the procedure unless otherwise instructed by your health care provider. °· Do not operate machinery or power tools for 24 hours after the procedure or as directed by your health care provider. °· If your procedure was done as an  outpatient procedure, which means that you went home the same day as your procedure, a responsible adult should be with you for the first 24 hours after you arrive home. °· Keep all follow-up visits as directed by your health care provider. This is important. °SEEK MEDICAL CARE IF: °· You have a fever. °· You have chills. °· You have increased bleeding from the catheter insertion site. Hold pressure on the site.  CALL 911 °SEEK IMMEDIATE MEDICAL CARE IF: °· You have unusual pain at the catheter insertion site. °· You have redness, warmth, or swelling at the catheter insertion site. °· You have drainage (other than a small amount of blood on the dressing) from the catheter insertion site. °· The catheter insertion site is bleeding, and the bleeding does not stop after 30 minutes of holding steady pressure on the site. °· The area near or just beyond the catheter insertion site becomes pale, cool, tingly, or numb. °  °This information is not intended to replace advice given to you by your health care provider. Make sure you discuss any questions you have with your health care provider. °  °Document Released: 11/08/2004 Document Revised: 05/13/2014 Document Reviewed: 09/23/2012 °Elsevier Interactive Patient Education ©2016 Elsevier Inc. ° °

## 2015-08-21 NOTE — H&P (View-Only) (Signed)
[facility] [place]  Patient name: Christopher Padilla MRN: [id number] DOB: 01-06-1941 Sex: male  REASON FOR CONSULT: Left hand discoloration and numbness of left hand. Referred by Dr. Redge Gainer.  HPI: Christopher Padilla is a 75 y.o. male, who I have seen in the remote past. I am unable to locate any old records but according to Epic he may have been seen last year in 2004. He underwent coronary revascularization in 2002 in the left radial artery was harvested. He states that about a year ago he began noticing some discoloration in his left hand. 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 This ultimately healed. He has had progressive discoloration of the left hand and was set up for a vascular consultation.  He describes paresthesias in the left hand. He also describes discoloration which improves when he elevates his hand. 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  His risk factors for vascular disease include diabetes, hypertension, and hyperlipidemia. He has a remote history of tobacco use. He denies any family history of premature cardiovascular disease.  He has undergone bilateral below-the-knee amputations.  He has stage III chronic kidney disease. GFR is 52. This was on 07/01/2015. Creatinine was 1.29.  Past Medical History  Diagnosis Date  . CAD (coronary artery disease)     S/P CABG, Feb 2003, LIMA to the LAD, left free radial artery to an obtuse marginal, spahenous vein graft to diagonal, saphenous vein graft to RCA with endarterectomy  . 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 Cerebrovascular disease     MRA in 2005 with 75% stenosis in distal right vertebral artery and 75% stenosis greater in distal cervical internal carotid artery on teh right, severe bilateral disease and MRA of the extracranial circulation, high grade stenosis of the distal right  internal carotid artery at the junction of the cervical internal carotid artery of the skull base  . Diabetes mellitus   . Hyperlipidemia   . Hypothyroidism   . History of tobacco use     Quit in 1990  . Myocardial infarction ([place]) 2002  . GERD (gastroesophageal reflux disease)   . Arthritis   . Cataract   . Complete heart block ([place]) 05/14/2014    St. Jude BiV PM (serial number [id number]) pacemaker  . CKD stage 3 due to type 2 diabetes mellitus ([place])   . Osteopenia 2016  . Hypertension   . Collapsed lung     Family History  Problem Relation Age of Onset  . Cancer Mother     brain and lung  . Diabetes Father 29  . Coronary artery disease Father   . Colon cancer Neg Hx   . Diabetes Sister   . Diabetes Brother   . Diabetes Son     SOCIAL HISTORY: Social History   Social History  . Marital Status: Married    Spouse Name: N/A  . Number of Children: N/A  . Years of Education: N/A   Occupational History  . Retired    Social History Main Topics  . Smoking status: Former Smoker -- 1.00 packs/day    Types: Cigarettes    Start date: 11/04/1954    Quit date: 05/06/1988  . Smokeless tobacco: Never Used  . Alcohol Use: No  . Drug Use: No  . Sexual Activity: Not Currently   Other Topics Concern  . Not on  file   Social History Narrative   Married with 2 children    Allergies  Allergen Reactions  . Codeine     hallucinations  . Ciprofloxacin Diarrhea  . Nsaids Other (See Comments)    GI upset    Current Outpatient Prescriptions  Medication Sig Dispense Refill  . ACCU-CHEK SOFTCLIX LANCETS lancets Check BG 4Xdaily DX E11.65 300 each 1  . aspirin 81 MG tablet Take 81 mg by mouth daily.      . Blood Glucose Monitoring Suppl (ACCU-CHEK AVIVA PLUS) W/DEVICE KIT Check BG 4XDaily DX E11.65 1 kit 0  . Cholecalciferol (VITAMIN D PO) Take 1,000 Units by mouth daily.     Marland Kitchen COREG CR 10 MG 24 hr capsule TAKE 1 CAPSULE (10 MG TOTAL) BY MOUTH DAILY. 30 capsule 2  . gabapentin  (NEURONTIN) 600 MG tablet TAKE 1 TABLET THREE TIMES A DAY 90 tablet 1  . glucose blood (ACCU-CHEK AVIVA PLUS) test strip Check BG 4xdaily DX Ell.65 300 each 1  . insulin aspart (NOVOLOG) 100 UNIT/ML injection Inject 0-20 units as directed per sliding scale (Patient taking differently: Inject 0-20 Units into the skin 3 (three) times daily with meals. Inject 0-20 units as directed per sliding scale) 30 mL 6  . Insulin Syringe-Needle U-100 (BD INSULIN SYRINGE ULTRAFINE) 31G X 15/64" 0.5 ML MISC Use once daily 100 each 1  . LANTUS 100 UNIT/ML injection INJECT 4O UNITS SUBCUTANEOUSLY AT BEDTIME  2  . levothyroxine (SYNTHROID, LEVOTHROID) 125 MCG tablet TAKE 1 TABLET (125 MCG TOTAL) BY MOUTH DAILY. 30 tablet 1  . nortriptyline (PAMELOR) 25 MG capsule TAKE 1 CAPSULE (25 MG TOTAL) BY MOUTH AT BEDTIME. 30 capsule 1  . omeprazole (PRILOSEC) 20 MG capsule TAKE 1 CAPSULE BY MOUTH DAILY 30 capsule 1  . simvastatin (ZOCOR) 40 MG tablet TAKE 1 TABLET (40 MG TOTAL) BY MOUTH AT BEDTIME. 90 tablet 3  . tetrahydrozoline (EYE DROPS) 0.05 % ophthalmic solution Apply 1-2 drops to eye daily.    . Chlorphen-Pseudoephed-APAP (COLD MEDICINE PLUS) 2-30-325 MG CAPS Take 1-2 capsules by mouth daily as needed (for cold and cough). Reported on 08/09/2015     No current facility-administered medications for this visit.    REVIEW OF SYSTEMS:  _0  denotes positive finding, _1  denotes negative finding Cardiac  Comments:  Chest pain or chest pressure:    Shortness of breath upon exertion:    Short of breath when lying flat:    Irregular heart rhythm:        Vascular    Pain in calf, thigh, or hip brought on by ambulation:    Pain in feet at night that wakes you up from your sleep:     Blood clot in your veins:    Leg swelling:         Pulmonary    Oxygen at home:    Productive cough:     Wheezing:         Neurologic    Sudden weakness in arms or legs:     Sudden numbness in arms or legs:     Sudden onset of  difficulty speaking or slurred speech:    Temporary loss of vision in one eye:     Problems with dizziness:         Gastrointestinal    Blood in stool:     Vomited blood:         Genitourinary    Burning when urinating:     Blood  in urine:        Psychiatric    Major depression:         Hematologic    Bleeding problems:    Problems with blood clotting too easily:        Skin    Rashes or ulcers:        Constitutional    Fever or chills:      PHYSICAL EXAM: Filed Vitals:   08/09/15 1550  BP: 135/66  Pulse: 80  Temp: 98.4 F (36.9 C)  TempSrc: Oral  Resp: 16  Height: _0  (1.727 m)  Weight: 158 lb 8 oz (71.895 kg)  SpO2: 95%    GENERAL: The patient is a well-nourished male, in no acute distress. The vital signs are documented above. CARDIAC: There is a regular rate and rhythm.  VASCULAR: I do not detect carotid bruits. He has a radial ulnar and palmar arch signal in the left hand with the Doppler. These are monophasic. I cannot palpate a right femoral pulse. Does have a diminished but palpable left femoral pulse. PULMONARY: There is good air exchange bilaterally without wheezing or rales. ABDOMEN: Soft and non-tender with normal pitched bowel sounds.  MUSCULOSKELETAL: He has bilateral below-the-knee amputations. He is had previous amputation of the left index finger. NEUROLOGIC: No focal weakness or paresthesias are detected. SKIN: He does have some dependent rubor of the left hand. PSYCHIATRIC: The patient has a normal affect.  DATA:   UPPER EXTREMITY ARTERIAL DUPLEX: I have independently interpreted his upper extremity arterial duplex of the left upper extremity. This shows biphasic Doppler flow in the left subclavian and axillary arteries and monophasic flow from the proximal brachial artery and distal to that. The radial artery has been previously harvested. There is a monophasic ulnar signal with the Doppler proximally but no ulnar signal distally.  There  is plaque noted throughout the brachial artery on the left. There is no flow noted in the distal ulnar artery.  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 The radial artery has been harvested for previous CABG. The ulnar artery appears to occlude distally. I'm not sure if he has any options for revascularization however I have recommended that we proceed with an arteriogram. We will have him come in early in the morning and do him as the third case of that he can be hydrated in the morning. If he has any options for revascularization, then I will need to see him back in the office for bilateral great saphenous vein mapping of both thighs. Fortunately he is not a smoker. I have reviewed with the patient the indications for arteriography. In addition, I have reviewed the potential complications of arteriography including but not limited to: Bleeding, arterial injury, arterial thrombosis, dye action, renal insufficiency, or other unpredictable medical problems. I have explained to the patient that if we find disease amenable to angioplasty we could potentially address this at the same time. I have discussed the potential complications of angioplasty and stenting, including but not limited to: Bleeding, arterial thrombosis, arterial injury, dissection, or the need for surgical intervention.  Deitra Mayo [facility] [place]: [phone]

## 2015-08-22 ENCOUNTER — Encounter (HOSPITAL_COMMUNITY): Payer: Self-pay | Admitting: Vascular Surgery

## 2015-08-22 DIAGNOSIS — I70298 Other atherosclerosis of native arteries of extremities, other extremity: Secondary | ICD-10-CM | POA: Diagnosis not present

## 2015-08-22 LAB — GLUCOSE, CAPILLARY
Glucose-Capillary: 142 mg/dL — ABNORMAL HIGH (ref 65–99)
Glucose-Capillary: 166 mg/dL — ABNORMAL HIGH (ref 65–99)

## 2015-08-22 LAB — HEMOGLOBIN A1C
HEMOGLOBIN A1C: 10.2 % — AB (ref 4.8–5.6)
MEAN PLASMA GLUCOSE: 246 mg/dL

## 2015-08-22 NOTE — Progress Notes (Signed)
[facility] [place]  Subjective  - Patient was nauseous post op yesterday and dizzy when evert he head of the bed was elevated.  I admitted him for observation.  This am he reports feeling well without nausea or dizziness.     Objective 137/62 72 97.6 F (36.4 C) (Oral) 16 100%  Intake/Output Summary (Last 24 hours) at 08/22/15 0708 Last data filed at 08/22/15 0641  Gross per 24 hour  Intake 1449.6 ml  Output    450 ml  Net  999.6 ml    Right arm palpable radial pulses Right groin soft without hematoma   Assessment/Planning: ischemia of the left hand POD # 1 s/p left UE angiogram Plan endarterectomy of the stenosis in the brachial artery with patch angioplasty. Stable disposition for discharge home   Clinton Gallant [facility] 08/22/2015 7:08 AM --  Laboratory Lab Results:  Recent Labs  08/21/15 0805 08/21/15 1752  WBC  --  4.8  HGB 13.9 13.2  HCT 41.0 41.4  PLT  --  139*   BMET  Recent Labs  08/21/15 0805 08/21/15 1752  NA 141 139  K 4.2 5.0  CL 102 107  CO2  --  22  GLUCOSE 115* 207*  BUN 21* 18  CREATININE 1.50* 1.46*  CALCIUM  --  8.7*    COAG Lab Results  Component Value Date   INR 1.17 05/15/2014   INR 1.12 05/14/2014   INR 1.09 05/14/2014   No results found for: PTT

## 2015-08-22 NOTE — Progress Notes (Signed)
Discharge instructions given to patient and his son, questions answered.  Iv site discontinued, tele discontinued, denies any pain, Discharged with volunteers to home  Governor Specking, RN

## 2015-08-28 ENCOUNTER — Encounter (HOSPITAL_COMMUNITY): Payer: Self-pay | Admitting: Vascular Surgery

## 2015-08-28 ENCOUNTER — Encounter (HOSPITAL_COMMUNITY): Payer: Self-pay | Admitting: *Deleted

## 2015-08-28 MED ORDER — SODIUM CHLORIDE 0.9 % IV SOLN
INTRAVENOUS | Status: DC
  Administered 2015-09-07: 07:00:00 via INTRAVENOUS

## 2015-08-28 MED ORDER — DEXTROSE 5 % IV SOLN: 1.5000 g | INTRAVENOUS | Status: AC

## 2015-08-28 NOTE — Progress Notes (Signed)
Pt denies any acute cardiopulmonary issues but is treated at [facility], Cardiology by several physicians. Pt made aware to stop vitamins, fish oil, herbal medications and NSAID's. Pt stated that his fasting BS ranges from 77-140. Pt stated that MD advised him to take half of his insulin tonight." Pt verbalized to take 16 units of Lantus instead of 32 units at HS. Pt made aware to not take any insulin the morning of surgery while in NPO status. Pt made aware of diabetes protocol to check BS every 2 hours, DOS, prior to arrival, interventions for a BS<70 and > 220, and telephone number for SS. Pt verbalized understanding of all pre-op instructions.

## 2015-08-28 NOTE — Progress Notes (Signed)
Anesthesia Chart Review: SAME DAY WORK-UP.  Patient is a 75 year old male scheduled for left brachial endarterectomy with patch angioplasty on 08/29/15 by Dr. [name]. He has a history of CABG including left radial artery harvest in 2003. About a year ago he had discoloration in his left hand and developed osteomyelitis in his left index finger requiring amputation.  He also has left hand paresthesia and discoloration felt consistent with dependent rubor. Recent angiography showed: FINDINGS:  1. The arch is widely patent without significant occlusive disease. The innominate artery, proximal right subclavian artery, proximal right common carotid artery are patent. The left common carotid artery is patent. The left subclavian proximally is patent. 2. The axillary artery is patent. There is a focal 90% stenosis in the brachial artery just beneath the axilla. Below that the brachial artery is patent. The proximal radial and ulnar arteries are patent although the ulnar artery occludes in the proximal third of the forearm. The radial artery occludes in the distal third of the forearm. There is some reconstitution of the palmar arch only. CLINICAL NOTE: His only option for improving blood flow to the left hand would be endarterectomy of the stenosis in the brachial artery with patch angioplasty.  Other history includes former smoker, CAD/inferior MI s/p CABG (LIMA-LAD, left RA-OM, SVG-DIAG, SVG-RCA) '03 (3 of 4 grafts patent 05/10/14, med tx rec.), ischemic cardiomyopathy, chronic systolic CHF, CHB s/p temporary pacer 05/14/14 followed by [facility]. 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 PCP is Dr. Rudi Heap with [facility]. EP cardiologist is Dr. Lewayne Bunting. Last visit 08/22/14.  Primary cardiologist is listed as Dr. Antoine Poche, although last non-EP cardiology visits were in 05/2014 during his admission for CHB. Just prior to that visit he had a cardiac cath with continued medical therapy recommended. He now has a PPM.  06/30/15 EKG: Atrial sensed ventricular paced rhythm. Biventricular pacemaker detected. PR interval 206 ms. Last pacemaker check (remote) was 02/20/15.   05/14/14  LHC (done following admission for syncope, 4 beats NSVT):  Hemodynamic Findings: Central aortic pressure: 135/49 Left ventricular pressure: 134/1/9 Angiographic Findings: Left main: Diffuse 20-30% stenosis.  Left Anterior Descending Artery: Moderate caliber diffusely diseased vessel that courses to the apex. The proximal vessel has diffuse 70% stenosis followed by 100% mid occlusion. The mid and distal vessel fills from the patent IMA graft.  Circumflex Artery: Small to moderate caliber vessel with diffuse 99% stenosis from the ostium with 100% mid occlusion. The remainder of the AV groove Circumflex fills from left to left collaterals. These vessels are diffusely diseased. The first OM branch fills from the patent graft.  Right Coronary Artery: Moderate caliber dominant vessel with 100% proximal occlusion. The distal vessel fills from the patent vein graft.  Graft Anatomy:  SVG to RCA is patent SVG to Diagonal is occluded Free radial graft to OM1 is patent with 40% mid stenosis. This is seen in multiple views and does appears to be moderate.  LIMA to mid LAD is patent Left Ventricular Angiogram: Deferred.  Impression: 1. Severe triple vessel CAD s/p CABG with 3/4 patent bypass grafts 2. Occluded SVG to Diagonal 3. Moderate stenosis in the patent free radial artery graft. This does not appear to be flow limiting. Since this is an arterial graft, I do not think this moderate stenosis is contributing  to his isolated episode of syncope or his cardiomyopathy.  4. No symptoms of unstable  angina Recommendations: Continue medical management of CAD.  04/19/14 Echo: Study Conclusions - Left ventricle: The cavity size was normal. Wall thickness was normal. Systolic function was mildly to moderately reduced. The estimated ejection fraction was in the range of 40% to 45%. There is hypokinesis of the mid-apicalanteroseptal and apical myocardium. Doppler parameters are consistent with abnormal left ventricular relaxation (grade 1 diastolic dysfunction). Doppler parameters are consistent with high ventricular filling pressure. - Aortic valve: There was mild regurgitation. - Mitral valve: There was moderate regurgitation. - Tricuspid valve: There was moderate regurgitation. - Pulmonary arteries: Systolic pressure was mildly to moderately increased. PA peak pressure: 40 mm Hg (S). Impressions: - Hypokinesis of the mid-distal anteroseptal and apical myocardium with mild to moderate reduction in LV function; grade 1 diastolic dysfunction with elevated LV filling pressures; mild AI; moderate MR; moderate, eccentric TR with mild to moderate elevation in pulmonary pressures. (Previous EF 50-55% with posterolateral and distal septal hypokinesis 10/20/12, 45-50% with diffuse hypokinesis 04/08/11.)  02/06/15 Carotid U/S:  1. 40-59% RICA stenosis. 2. < 40% LICA stenosis. 3. Difference in brachial pressures and monophasic left brachial artery waveform suggests proximal disease.  Labs on 08/21/15 noted. Cr 1.46, previously 1.15-1.50 since 10/2014; baseline appears to be ~ 1.3-1.4). H/H 13.2/41.4, PLT 139K. He is scheduled for an ISTAT4 on arrival.   Patient with known CAD, but with cath just over a year ago showing 3 of 4 patent grafts with continued medical therapy recommended. He was not having angina, but syncope and was later found to be in CHB and underwent pacemaker insertion in 05/2014. Device was interrogated within the past year. Perioperative device form from  [facility] is still pending.  He last saw cardiology right at one year ago. Patient is a same day work-up, so he will need further evaluation on the day of surgery to ensure no acute changes prior to proceeding.   Velna Ochs [facility] Short Stay Center/Anesthesiology Phone [phone] 08/28/2015 1:17 PM

## 2015-08-28 NOTE — Discharge Summary (Signed)
[facility] Discharge Summary   Patient ID:  Christopher Padilla MRN: [id number] DOB/AGE: 06/02/1940 75 y.o.  Admit date: 08/21/2015 Discharge date: 08/22/2015 Date of Surgery: 08/21/2015 Surgeon: Surgeon(s): Angelia Mould, MD  Admission Diagnosis: left hand numbness  Discharge Diagnoses:  left hand numbness  Secondary Diagnoses: Past Medical History  Diagnosis Date  . CAD (coronary artery disease)     S/P CABG, Feb 2003, LIMA to the LAD, left free radial artery to an obtuse marginal, spahenous vein graft to diagonal, saphenous vein graft to RCA with endarterectomy  . Cerebrovascular disease     MRA in 2005 with 75% stenosis in distal right vertebral artery and 75% stenosis greater in distal cervical internal carotid artery on teh right, severe bilateral disease and MRA of the extracranial circulation, high grade stenosis of the distal right internal carotid artery at the junction of the cervical internal carotid artery of the skull base  . Hyperlipidemia   . Hypothyroidism   . GERD (gastroesophageal reflux disease)   . Complete heart block ([place]) 05/14/2014    St[employer] Jude BiV PM (serial number [id number]) pacemaker  . CKD stage 3 due to type 2 diabetes mellitus ([place])   . Osteopenia 2016  . Hypertension   . Collapsed lung   . Presence of permanent cardiac pacemaker   . Type II diabetes mellitus ([place])   . Myocardial infarction ([place]) 2002  . History of blood transfusion 2002    "after my heart attack"  . History of stomach ulcers 1960s  . Arthritis     "left hand" (08/21/2015)  . History of tobacco use     Quit in 1990  . PONV (postoperative nausea and vomiting)   . Peripheral vascular disease ([place])     left hand    Procedure(s): Upper Extremity Angiography Aortic Arch Angiography  Discharged Condition: good  HPI:  Christopher Padilla is a 75 y.o. male, who I have seen in the remote past. I am unable to locate any old records but according to Epic he may  have been seen last year in 2004. He underwent coronary revascularization in 2002 in the left radial artery was harvested. He states that about a year ago he began noticing some discoloration in his left hand. 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 This ultimately healed. He has had progressive discoloration of the left hand and was set up for a vascular consultation.  He describes paresthesias in the left hand. He also describes discoloration which improves when he elevates his hand. 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  His risk factors for vascular disease include diabetes, hypertension, and hyperlipidemia. He has a remote history of tobacco use. He denies any family history of premature cardiovascular disease.  He has undergone bilateral below-the-knee amputations.  He has stage III chronic kidney disease. GFR is 52. This was on 07/01/2015. Creatinine was 1.29.   Hospital Course:  SKYY MCKNIGHT is a 75 y.o. male is S/P Procedure(s): Upper Extremity Angiography Aortic Arch Angiography  Post procedure he was dizzy and nauseous.  Multiple attempts to sit up and or stand.  He was unable to without dizziness.  He was given Zofran.  Nausea subsided.  He was admitted over night for observation.  Post-op morning he was alert and oriented.  No dizziness or nausea.  Patient was discharged in stable base line condition.  Right groin soft with  out hematoma.     Significant Diagnostic Studies: CBC Lab Results  Component Value Date   WBC 4.8 08/21/2015   HGB 13.2 08/21/2015   HCT 41.4 08/21/2015   MCV 93.7 08/21/2015   PLT 139* 08/21/2015    BMET    Component Value Date/Time   NA 139 08/21/2015 1752   NA 137 08/14/2015 0903   K 5.0 08/21/2015 1752   CL 107 08/21/2015 1752   CO2 22 08/21/2015 1752   GLUCOSE 207* 08/21/2015 1752   GLUCOSE 126* 08/14/2015 0903   BUN 18 08/21/2015 1752    BUN 16 08/14/2015 0903   CREATININE 1.46* 08/21/2015 1752   CREATININE 1.22 09/29/2012 0831   CALCIUM 8.7* 08/21/2015 1752   GFRNONAA 45* 08/21/2015 1752   GFRNONAA 58* 09/29/2012 0831   GFRAA 52* 08/21/2015 1752   GFRAA 68 09/29/2012 0831   COAG Lab Results  Component Value Date   INR 1.17 05/15/2014   INR 1.12 05/14/2014   INR 1.09 05/14/2014     Disposition:  Discharge to :Home Discharge Instructions    Call MD for:  redness, tenderness, or signs of infection (pain, swelling, bleeding, redness, odor or green/yellow discharge around incision site)    Complete by:  As directed      Call MD for:  severe or increased pain, loss or decreased feeling  in affected limb(s)    Complete by:  As directed      Call MD for:  temperature >100.5    Complete by:  As directed      Discharge instructions    Complete by:  As directed   You may shower this evening, remove dressing on right groin prior to shower.  Place band aide over incision after showering.     Increase activity slowly    Complete by:  As directed   Walk with assistance use walker or cane as needed     Resume previous diet    Complete by:  As directed             Medication List    TAKE these medications        ACCU-CHEK AVIVA PLUS w/Device Kit  Check BG 4XDaily DX E11.65     ACCU-CHEK SOFTCLIX LANCETS lancets  Check BG 4Xdaily DX E11.65     aspirin 81 MG tablet  Take 81 mg by mouth daily.     COLD MEDICINE PLUS 2-30-325 MG Caps  Generic drug:  Chlorphen-Pseudoephed-APAP  Take 1-2 capsules by mouth daily as needed (for cold and cough). Reported on 08/09/2015     COREG CR 10 MG 24 hr capsule  Generic drug:  carvedilol  TAKE 1 CAPSULE (10 MG TOTAL) BY MOUTH DAILY.     EYE DROPS 0.05 % ophthalmic solution  Generic drug:  tetrahydrozoline  Apply 1-2 drops to eye daily.     gabapentin 600 MG tablet  Commonly known as:  NEURONTIN  TAKE 1 TABLET THREE TIMES A DAY     glucose blood test strip  Commonly  known as:  ACCU-CHEK AVIVA PLUS  Check BG 4xdaily DX Ell.65     insulin aspart 100 UNIT/ML injection  Commonly known as:  NOVOLOG  Inject 0-20 units as directed per sliding scale     insulin glargine 100 UNIT/ML injection  Commonly known as:  LANTUS  Inject 32 Units into the skin at bedtime.     Insulin Syringe-Needle U-100 31G X 15/64" 0.5 ML Misc  Commonly known as:  BD  INSULIN SYRINGE ULTRAFINE  Use once daily     levothyroxine 125 MCG tablet  Commonly known as:  SYNTHROID, LEVOTHROID  TAKE 1 TABLET (125 MCG TOTAL) BY MOUTH DAILY.     nortriptyline 25 MG capsule  Commonly known as:  PAMELOR  TAKE 1 CAPSULE (25 MG TOTAL) BY MOUTH AT BEDTIME.     omeprazole 20 MG capsule  Commonly known as:  PRILOSEC  TAKE 1 CAPSULE BY MOUTH DAILY     simvastatin 40 MG tablet  Commonly known as:  ZOCOR  TAKE 1 TABLET (40 MG TOTAL) BY MOUTH AT BEDTIME.     VITAMIN D PO  Take 1,000 Units by mouth daily.       Verbal and written Discharge instructions given to the patient. Wound care per Discharge AVS   Signed: Laurence Slate [facility] 08/28/2015, 12:11 PM

## 2015-08-28 NOTE — Progress Notes (Signed)
Requested that anesthesia review pt cardiac history.

## 2015-08-29 ENCOUNTER — Ambulatory Visit (HOSPITAL_COMMUNITY): Admission: RE | Admit: 2015-08-29 | Payer: PPO | Source: Ambulatory Visit | Admitting: Vascular Surgery

## 2015-08-29 HISTORY — DX: Peripheral vascular disease, unspecified: I73.9

## 2015-08-29 HISTORY — DX: Other specified postprocedural states: R11.2

## 2015-08-29 HISTORY — DX: Other specified postprocedural states: Z98.890

## 2015-08-29 SURGERY — THROMBECTOMY, ARTERY, BRACHIAL
Anesthesia: Choice | Laterality: Left

## 2015-08-31 ENCOUNTER — Ambulatory Visit (INDEPENDENT_AMBULATORY_CARE_PROVIDER_SITE_OTHER): Payer: PPO | Admitting: Pharmacist

## 2015-08-31 ENCOUNTER — Encounter: Payer: Self-pay | Admitting: Vascular Surgery

## 2015-08-31 ENCOUNTER — Encounter: Payer: Self-pay | Admitting: Pharmacist

## 2015-08-31 ENCOUNTER — Other Ambulatory Visit: Payer: Self-pay | Admitting: *Deleted

## 2015-08-31 VITALS — BP 132/68 | HR 76 | Ht 68.0 in | Wt 159.0 lb

## 2015-08-31 DIAGNOSIS — N183 Chronic kidney disease, stage 3 unspecified: Secondary | ICD-10-CM

## 2015-08-31 DIAGNOSIS — E1122 Type 2 diabetes mellitus with diabetic chronic kidney disease: Secondary | ICD-10-CM

## 2015-08-31 DIAGNOSIS — IMO0001 Reserved for inherently not codable concepts without codable children: Secondary | ICD-10-CM

## 2015-08-31 DIAGNOSIS — Z794 Long term (current) use of insulin: Principal | ICD-10-CM

## 2015-08-31 DIAGNOSIS — E1139 Type 2 diabetes mellitus with other diabetic ophthalmic complication: Secondary | ICD-10-CM | POA: Diagnosis not present

## 2015-08-31 NOTE — Patient Instructions (Signed)
   1 serving of carbohydrate containing food 2 or 3 servings of carbohydrate containing foods 4 or more serving of carbohydrate containing foods  5 units of humalog insulin 7 unit of humalog insulin 9 units of humalog insulin     Blood glucose  Amount of Humalog insulin  Less than 100 none  101 to 120 2 units  121 to 140 4 units  141 to 160 6 units  161 to 180 7 units  181 to 200 8 units  201 or over 10 units

## 2015-08-31 NOTE — Progress Notes (Signed)
Subjective:    Christopher Padilla is a 75 y.o. male who presents for an reevaluation of Type 2 diabetes mellitus, treated with insulin.  Last visit was about 3 months ago.  The patient was initially diagnosed with Type 2 diabetes mellitus about 36 years ago (at 75 yo)..    Known diabetic complications: retinopathy, peripheral neuropathy, cardiovascular disease and amplutations - BKA  - bilaterally and pointer finger of left hand Cardiovascular risk factors: advanced age (older than 48 for men, 91 for women), diabetes mellitus, dyslipidemia, hypertension and male gender Current diabetic medications include Lantus 32 units at bedtime and Humalog per below  1 serving of carbohydrate containing food 2 or 3 servings of carbohydrate containing foods 4 or more serving of carbohydrate containing foods  2 units of humalog insulin 4 units of humalog insulin 6 units of humalog insulin     Blood glucose  Amount of Humalog insulin  Less than 100 none  101 to 120 2 unit  121 to 140 3 units  141 to 160 4 units  161 to 180 5 units  181 to 200 6 units  201 or over 8 units        Eye exam current (within one year): no - but has appt this month Weight trend: stable Prior visit with dietician: yes  Current diet:  He reports times of eating too high CHO amount.  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   Home blood sugar records: am readings - 166, 145, 75 (reported by patient) RBG was 367 in office today (ate about 2 hours ago and did not take insulin because did not have strips for meter he has at store)  Any episodes of hypoglycemia? 1  or 2 times per month  Is He on ACE inhibitor or angiotensin II receptor blocker?  No ramipril was discontinued around 04/2014 - possibly related to increase serum creatinine which has since decreased and is currently stable.   The following portions of the patient's history were reviewed and updated as appropriate: allergies, current medications, past family history, past medical history, past social history, past surgical history and problem list.   Objective:    BP 132/68 mmHg  Pulse 76  Ht 5\' 8"  (1.727 m)  Wt 159 lb (72.122 kg)  BMI 24.18 kg/m2  Lab Review GLUCOSE (mg/dL)  Date Value  39/07/90 126*  03/13/2015 190*  10/21/2014 321*   GLUCOSE, BLD (mg/dL)  Date Value  33/00/7622 207*  08/21/2015 115*  07/01/2015 109*   CO2 (mmol/L)  Date Value  08/21/2015 22  08/14/2015 25  07/01/2015 28   BUN (mg/dL)  Date Value  63/33/5456 18  08/21/2015 21*  08/14/2015 16  07/01/2015 22*  03/13/2015 15  10/21/2014 23   CREAT (mg/dL)  Date Value  25/63/8937 1.22   CREATININE, SER (mg/dL)  Date Value  34/28/7681 1.46*  08/21/2015 1.50*  08/14/2015 1.15      Assessment:    Diabetes Mellitus type II, under inadequate control.   HTN - controlled  Plan:    1.  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  Patient reports having enough test strips for glucometer at home.  Increase base amount of Humalog with each meal by 2 units (see sliding scale below)  1 serving of carbohydrate containing food 2 or 3 servings of carbohydrate containing foods 4 or more serving of carbohydrate containing foods  5 units of humalog insulin 7 unit of humalog insulin 9 units of humalog insulin     Blood glucose  Amount of Humalog insulin  Less than 100 none  101 to 120 2 units   121 to 140 4 units  141 to 160 6 units  161 to 180 7 units  181 to 200 8 units  201 or over 10 units        2.  Education: Reviewed 'ABCs' of diabetes management (respective goals in parentheses):  A1C (<7), blood pressure (<130/80), and cholesterol (LDL <100). 3.  Follow up in 1 month (will try to so CGM for patient)  Henrene Pastor, PharmD, CPP, CDE

## 2015-09-06 ENCOUNTER — Other Ambulatory Visit: Payer: Self-pay

## 2015-09-06 ENCOUNTER — Encounter (HOSPITAL_COMMUNITY): Payer: Self-pay | Admitting: *Deleted

## 2015-09-06 NOTE — Progress Notes (Signed)
Called pt for pre-op call, he verified that nothing has changed with his meds, allergies, medical and surgical history since last week when we called for a pre-op call. Pt denies recent chest pain or sob. Pt states his fasting blood sugar yesterday was 75 and today was 55. Pt was instructed by Dr. [name] office to reduce his Lantus Insulin dose by half tonight. I instructed pt to check his blood sugar in the AM.  If blood sugar is >220 take 1/2 of usual correction dose of Novolog insulin. If blood sugar is 70 or below, treat with 1/2 cup of clear juice (apple or cranberry) and recheck blood sugar 15 minutes after drinking juice. If blood sugar continues to be 70 or below, call the Short Stay department and ask to speak to a nurse. Phone # given. Pt voiced understanding. These instructions given per Diabetes Medication Adjustment Guidelines Prior to Procedure and Surgery.

## 2015-09-07 ENCOUNTER — Encounter (HOSPITAL_COMMUNITY): Payer: Self-pay | Admitting: Anesthesiology

## 2015-09-07 ENCOUNTER — Telehealth: Payer: Self-pay | Admitting: Surgery

## 2015-09-07 ENCOUNTER — Ambulatory Visit (HOSPITAL_COMMUNITY): Payer: PPO | Admitting: Anesthesiology

## 2015-09-07 ENCOUNTER — Encounter (HOSPITAL_COMMUNITY)
Admission: RE | Disposition: A | Discharge status: Home / Self Care | Payer: Self-pay | Source: Ambulatory Visit | Attending: Vascular Surgery

## 2015-09-07 ENCOUNTER — Inpatient Hospital Stay (HOSPITAL_COMMUNITY)
Admission: RE | Admit: 2015-09-07 | Discharge: 2015-09-08 | DRG: 254 | Disposition: A | Discharge status: Home / Self Care | Payer: PPO | Source: Ambulatory Visit | Attending: Vascular Surgery | Admitting: Vascular Surgery

## 2015-09-07 DIAGNOSIS — E1122 Type 2 diabetes mellitus with diabetic chronic kidney disease: Secondary | ICD-10-CM | POA: Diagnosis not present

## 2015-09-07 DIAGNOSIS — Z951 Presence of aortocoronary bypass graft: Secondary | ICD-10-CM | POA: Diagnosis not present

## 2015-09-07 DIAGNOSIS — Z89511 Acquired absence of right leg below knee: Secondary | ICD-10-CM

## 2015-09-07 DIAGNOSIS — K219 Gastro-esophageal reflux disease without esophagitis: Secondary | ICD-10-CM | POA: Diagnosis not present

## 2015-09-07 DIAGNOSIS — R200 Anesthesia of skin: Secondary | ICD-10-CM | POA: Diagnosis not present

## 2015-09-07 DIAGNOSIS — I252 Old myocardial infarction: Secondary | ICD-10-CM

## 2015-09-07 DIAGNOSIS — Z87891 Personal history of nicotine dependence: Secondary | ICD-10-CM

## 2015-09-07 DIAGNOSIS — Z89512 Acquired absence of left leg below knee: Secondary | ICD-10-CM | POA: Diagnosis not present

## 2015-09-07 DIAGNOSIS — Z794 Long term (current) use of insulin: Secondary | ICD-10-CM | POA: Diagnosis not present

## 2015-09-07 DIAGNOSIS — Z7982 Long term (current) use of aspirin: Secondary | ICD-10-CM | POA: Diagnosis not present

## 2015-09-07 DIAGNOSIS — Z950 Presence of cardiac pacemaker: Secondary | ICD-10-CM

## 2015-09-07 DIAGNOSIS — Z89022 Acquired absence of left finger(s): Secondary | ICD-10-CM | POA: Diagnosis not present

## 2015-09-07 DIAGNOSIS — E785 Hyperlipidemia, unspecified: Secondary | ICD-10-CM | POA: Diagnosis present

## 2015-09-07 DIAGNOSIS — N183 Chronic kidney disease, stage 3 (moderate): Secondary | ICD-10-CM | POA: Diagnosis not present

## 2015-09-07 DIAGNOSIS — I129 Hypertensive chronic kidney disease with stage 1 through stage 4 chronic kidney disease, or unspecified chronic kidney disease: Secondary | ICD-10-CM | POA: Diagnosis not present

## 2015-09-07 DIAGNOSIS — Z79899 Other long term (current) drug therapy: Secondary | ICD-10-CM | POA: Diagnosis not present

## 2015-09-07 DIAGNOSIS — I739 Peripheral vascular disease, unspecified: Secondary | ICD-10-CM | POA: Diagnosis not present

## 2015-09-07 DIAGNOSIS — E039 Hypothyroidism, unspecified: Secondary | ICD-10-CM | POA: Diagnosis not present

## 2015-09-07 DIAGNOSIS — I2510 Atherosclerotic heart disease of native coronary artery without angina pectoris: Secondary | ICD-10-CM | POA: Diagnosis not present

## 2015-09-07 DIAGNOSIS — I998 Other disorder of circulatory system: Secondary | ICD-10-CM | POA: Diagnosis not present

## 2015-09-07 DIAGNOSIS — I70208 Unspecified atherosclerosis of native arteries of extremities, other extremity: Secondary | ICD-10-CM | POA: Diagnosis not present

## 2015-09-07 DIAGNOSIS — M1990 Unspecified osteoarthritis, unspecified site: Secondary | ICD-10-CM | POA: Diagnosis not present

## 2015-09-07 HISTORY — PX: ENDARTERECTOMY: SHX5162

## 2015-09-07 HISTORY — PX: PATCH ANGIOPLASTY: SHX6230

## 2015-09-07 LAB — CBC
HCT: 36.7 % — ABNORMAL LOW (ref 39.0–52.0)
Hemoglobin: 11.8 g/dL — ABNORMAL LOW (ref 13.0–17.0)
MCH: 29.8 pg (ref 26.0–34.0)
MCHC: 32.2 g/dL (ref 30.0–36.0)
MCV: 92.7 fL (ref 78.0–100.0)
PLATELETS: 143 10*3/uL — AB (ref 150–400)
RBC: 3.96 MIL/uL — AB (ref 4.22–5.81)
RDW: 13.9 % (ref 11.5–15.5)
WBC: 6.3 10*3/uL (ref 4.0–10.5)

## 2015-09-07 LAB — POCT I-STAT 4, (NA,K, GLUC, HGB,HCT)
Glucose, Bld: 141 mg/dL — ABNORMAL HIGH (ref 65–99)
HCT: 42 % (ref 39.0–52.0)
Hemoglobin: 14.3 g/dL (ref 13.0–17.0)
Potassium: 4.3 mmol/L (ref 3.5–5.1)
Sodium: 139 mmol/L (ref 135–145)

## 2015-09-07 LAB — GLUCOSE, CAPILLARY
GLUCOSE-CAPILLARY: 72 mg/dL (ref 65–99)
GLUCOSE-CAPILLARY: 63 mg/dL — AB (ref 65–99)
GLUCOSE-CAPILLARY: 103 mg/dL — AB (ref 65–99)
GLUCOSE-CAPILLARY: 190 mg/dL — AB (ref 65–99)
GLUCOSE-CAPILLARY: 194 mg/dL — AB (ref 65–99)
Glucose-Capillary: 131 mg/dL — ABNORMAL HIGH (ref 65–99)

## 2015-09-07 LAB — CREATININE, SERUM
Creatinine, Ser: 1.57 mg/dL — ABNORMAL HIGH (ref 0.61–1.24)
GFR calc Af Amer: 48 mL/min — ABNORMAL LOW (ref >60)
GFR calc non Af Amer: 41 mL/min — ABNORMAL LOW (ref >60)

## 2015-09-07 LAB — SURGICAL PCR SCREEN
MRSA, PCR: NEGATIVE
STAPHYLOCOCCUS AUREUS: NEGATIVE

## 2015-09-07 SURGERY — ENDARTERECTOMY, SUBCLAVIAN
Anesthesia: General | Site: Arm Upper | Laterality: Left

## 2015-09-07 MED ORDER — PHENYLEPHRINE HCL 10 MG/ML IJ SOLN
10.0000 mg | INTRAVENOUS | Status: DC | PRN
  Administered 2015-09-07: 50 ug/min via INTRAVENOUS
  Administered 2015-09-07: 25 ug/min via INTRAVENOUS

## 2015-09-07 MED ORDER — METOPROLOL TARTRATE 5 MG/5ML IV SOLN: 2.0000 mg | INTRAVENOUS | Status: DC | PRN

## 2015-09-07 MED ORDER — MAGNESIUM HYDROXIDE 400 MG/5ML PO SUSP: 30.0000 mL | Freq: Every day | ORAL | Status: DC | PRN

## 2015-09-07 MED ORDER — ROCURONIUM BROMIDE 50 MG/5ML IV SOLN
INTRAVENOUS | Status: AC
  Filled 2015-09-07: qty 1

## 2015-09-07 MED ORDER — SIMVASTATIN 40 MG PO TABS
40.0000 mg | ORAL_TABLET | Freq: Every day | ORAL | Status: DC
  Filled 2015-09-07: qty 1

## 2015-09-07 MED ORDER — PANTOPRAZOLE SODIUM 40 MG PO TBEC
40.0000 mg | DELAYED_RELEASE_TABLET | Freq: Every day | ORAL | Status: DC
  Administered 2015-09-08: 40 mg via ORAL
  Filled 2015-09-07 (×2): qty 1

## 2015-09-07 MED ORDER — GABAPENTIN 600 MG PO TABS
600.0000 mg | ORAL_TABLET | Freq: Three times a day (TID) | ORAL | Status: DC
  Administered 2015-09-07 – 2015-09-08 (×3): 600 mg via ORAL
  Filled 2015-09-07 (×3): qty 1

## 2015-09-07 MED ORDER — LIDOCAINE HCL (CARDIAC) 20 MG/ML IV SOLN
INTRAVENOUS | Status: DC | PRN
  Administered 2015-09-07: 40 mg via INTRAVENOUS

## 2015-09-07 MED ORDER — SUCCINYLCHOLINE CHLORIDE 200 MG/10ML IV SOSY
PREFILLED_SYRINGE | INTRAVENOUS | Status: AC
  Filled 2015-09-07: qty 10

## 2015-09-07 MED ORDER — SODIUM CHLORIDE 0.9 % IR SOLN
Status: DC | PRN
  Administered 2015-09-07: 1000 mL

## 2015-09-07 MED ORDER — FENTANYL CITRATE (PF) 100 MCG/2ML IJ SOLN: 25.0000 ug | INTRAMUSCULAR | Status: DC | PRN

## 2015-09-07 MED ORDER — HYDRALAZINE HCL 20 MG/ML IJ SOLN
5.0000 mg | INTRAMUSCULAR | Status: DC | PRN
Start: 2015-09-07 — End: 2015-09-08

## 2015-09-07 MED ORDER — SUCCINYLCHOLINE CHLORIDE 20 MG/ML IJ SOLN
INTRAMUSCULAR | Status: DC | PRN
  Administered 2015-09-07: 100 mg via INTRAVENOUS

## 2015-09-07 MED ORDER — DEXTROSE 50 % IV SOLN
12.5000 g | Freq: Once | INTRAVENOUS | Status: AC
  Administered 2015-09-07: 12.5 g via INTRAVENOUS

## 2015-09-07 MED ORDER — TRAMADOL HCL 50 MG PO TABS: 50.0000 mg | ORAL_TABLET | Freq: Four times a day (QID) | ORAL | Status: DC | PRN

## 2015-09-07 MED ORDER — CHLORHEXIDINE GLUCONATE CLOTH 2 % EX PADS: 6.0000 | MEDICATED_PAD | Freq: Once | CUTANEOUS | Status: DC

## 2015-09-07 MED ORDER — ACETAMINOPHEN 325 MG RE SUPP: 325.0000 mg | RECTAL | Status: DC | PRN

## 2015-09-07 MED ORDER — FENTANYL CITRATE (PF) 250 MCG/5ML IJ SOLN
INTRAMUSCULAR | Status: AC
  Filled 2015-09-07: qty 5

## 2015-09-07 MED ORDER — CARVEDILOL PHOSPHATE ER 10 MG PO CP24
10.0000 mg | ORAL_CAPSULE | Freq: Every day | ORAL | Status: DC
  Administered 2015-09-08: 10 mg via ORAL
  Filled 2015-09-07: qty 1

## 2015-09-07 MED ORDER — BISACODYL 10 MG RE SUPP: 10.0000 mg | Freq: Every day | RECTAL | Status: DC | PRN

## 2015-09-07 MED ORDER — MUPIROCIN 2 % EX OINT
1.0000 "application " | TOPICAL_OINTMENT | Freq: Once | CUTANEOUS | Status: AC
  Administered 2015-09-07: 1 via TOPICAL
  Filled 2015-09-07: qty 22

## 2015-09-07 MED ORDER — MIDAZOLAM HCL 5 MG/5ML IJ SOLN
INTRAMUSCULAR | Status: DC | PRN
  Administered 2015-09-07: 1 mg via INTRAVENOUS

## 2015-09-07 MED ORDER — INSULIN ASPART 100 UNIT/ML ~~LOC~~ SOLN
0.0000 [IU] | Freq: Three times a day (TID) | SUBCUTANEOUS | Status: DC
  Administered 2015-09-07: 3 [IU] via SUBCUTANEOUS
  Administered 2015-09-08 (×2): 8 [IU] via SUBCUTANEOUS

## 2015-09-07 MED ORDER — ACETAMINOPHEN 325 MG PO TABS: 325.0000 mg | ORAL_TABLET | ORAL | Status: DC | PRN

## 2015-09-07 MED ORDER — DOCUSATE SODIUM 100 MG PO CAPS
100.0000 mg | ORAL_CAPSULE | Freq: Every day | ORAL | Status: DC
Start: 2015-09-08 — End: 2015-09-08
  Administered 2015-09-08: 100 mg via ORAL
  Filled 2015-09-07: qty 1

## 2015-09-07 MED ORDER — DEXTROSE 50 % IV SOLN
INTRAVENOUS | Status: AC
  Filled 2015-09-07: qty 50

## 2015-09-07 MED ORDER — HEPARIN SODIUM (PORCINE) 1000 UNIT/ML IJ SOLN
INTRAMUSCULAR | Status: AC
  Filled 2015-09-07: qty 1

## 2015-09-07 MED ORDER — PHENYLEPHRINE 40 MCG/ML (10ML) SYRINGE FOR IV PUSH (FOR BLOOD PRESSURE SUPPORT)
PREFILLED_SYRINGE | INTRAVENOUS | Status: AC
  Filled 2015-09-07: qty 10

## 2015-09-07 MED ORDER — LEVOTHYROXINE SODIUM 25 MCG PO TABS
125.0000 ug | ORAL_TABLET | Freq: Every day | ORAL | Status: DC
  Administered 2015-09-08: 125 ug via ORAL
  Filled 2015-09-07: qty 1

## 2015-09-07 MED ORDER — ALUM & MAG HYDROXIDE-SIMETH 200-200-20 MG/5ML PO SUSP: 15.0000 mL | ORAL | Status: DC | PRN

## 2015-09-07 MED ORDER — ENOXAPARIN SODIUM 30 MG/0.3ML ~~LOC~~ SOLN
30.0000 mg | SUBCUTANEOUS | Status: DC
  Administered 2015-09-08: 30 mg via SUBCUTANEOUS
  Filled 2015-09-07: qty 0.3

## 2015-09-07 MED ORDER — SODIUM CHLORIDE 0.9 % IV SOLN: INTRAVENOUS | Status: DC

## 2015-09-07 MED ORDER — MIDAZOLAM HCL 2 MG/2ML IJ SOLN
INTRAMUSCULAR | Status: AC
  Filled 2015-09-07: qty 2

## 2015-09-07 MED ORDER — ONDANSETRON HCL 4 MG/2ML IJ SOLN: 4.0000 mg | Freq: Once | INTRAMUSCULAR | Status: DC | PRN

## 2015-09-07 MED ORDER — PROPOFOL 10 MG/ML IV BOLUS
INTRAVENOUS | Status: DC | PRN
  Administered 2015-09-07: 130 mg via INTRAVENOUS

## 2015-09-07 MED ORDER — ASPIRIN EC 81 MG PO TBEC
81.0000 mg | DELAYED_RELEASE_TABLET | Freq: Every day | ORAL | Status: DC
  Administered 2015-09-08: 81 mg via ORAL
  Filled 2015-09-07: qty 1

## 2015-09-07 MED ORDER — PHENOL 1.4 % MT LIQD: 1.0000 | OROMUCOSAL | Status: DC | PRN

## 2015-09-07 MED ORDER — GUAIFENESIN-DM 100-10 MG/5ML PO SYRP: 15.0000 mL | ORAL_SOLUTION | ORAL | Status: DC | PRN

## 2015-09-07 MED ORDER — MORPHINE SULFATE (PF) 2 MG/ML IV SOLN
1.0000 mg | INTRAVENOUS | Status: DC | PRN
  Administered 2015-09-07 – 2015-09-08 (×4): 2 mg via INTRAVENOUS
  Filled 2015-09-07 (×5): qty 1

## 2015-09-07 MED ORDER — HEPARIN SODIUM (PORCINE) 1000 UNIT/ML IJ SOLN
INTRAMUSCULAR | Status: DC | PRN
  Administered 2015-09-07: 7000 [IU] via INTRAVENOUS

## 2015-09-07 MED ORDER — PROPOFOL 10 MG/ML IV BOLUS
INTRAVENOUS | Status: AC
  Filled 2015-09-07: qty 20

## 2015-09-07 MED ORDER — CEFUROXIME SODIUM 1.5 G IJ SOLR
1.5000 g | Freq: Two times a day (BID) | INTRAMUSCULAR | Status: AC
  Administered 2015-09-07 – 2015-09-08 (×2): 1.5 g via INTRAVENOUS
  Filled 2015-09-07 (×3): qty 1.5

## 2015-09-07 MED ORDER — DEXTROSE 5 % IV SOLN
1.5000 g | INTRAVENOUS | Status: AC
  Administered 2015-09-07: 1.5 g via INTRAVENOUS
  Filled 2015-09-07: qty 1.5

## 2015-09-07 MED ORDER — HEPARIN SODIUM (PORCINE) 5000 UNIT/ML IJ SOLN
INTRAMUSCULAR | Status: DC | PRN
  Administered 2015-09-07: 500 mL

## 2015-09-07 MED ORDER — ONDANSETRON HCL 4 MG/2ML IJ SOLN
INTRAMUSCULAR | Status: DC | PRN
  Administered 2015-09-07: 4 mg via INTRAVENOUS

## 2015-09-07 MED ORDER — ONDANSETRON HCL 4 MG/2ML IJ SOLN: 4.0000 mg | Freq: Four times a day (QID) | INTRAMUSCULAR | Status: DC | PRN

## 2015-09-07 MED ORDER — SODIUM CHLORIDE 0.9 % IV SOLN: 500.0000 mL | Freq: Once | INTRAVENOUS | Status: DC | PRN

## 2015-09-07 MED ORDER — POTASSIUM CHLORIDE CRYS ER 20 MEQ PO TBCR: 20.0000 meq | EXTENDED_RELEASE_TABLET | Freq: Every day | ORAL | Status: DC | PRN

## 2015-09-07 MED ORDER — LABETALOL HCL 5 MG/ML IV SOLN: 10.0000 mg | INTRAVENOUS | Status: DC | PRN

## 2015-09-07 MED ORDER — NORTRIPTYLINE HCL 25 MG PO CAPS
25.0000 mg | ORAL_CAPSULE | Freq: Every day | ORAL | Status: DC
  Administered 2015-09-07: 25 mg via ORAL
  Filled 2015-09-07: qty 1

## 2015-09-07 MED ORDER — TRAMADOL HCL 50 MG PO TABS
50.0000 mg | ORAL_TABLET | Freq: Four times a day (QID) | ORAL | Status: DC | PRN
  Administered 2015-09-07 – 2015-09-08 (×2): 50 mg via ORAL
  Filled 2015-09-07 (×2): qty 1

## 2015-09-07 MED ORDER — FENTANYL CITRATE (PF) 100 MCG/2ML IJ SOLN
INTRAMUSCULAR | Status: DC | PRN
  Administered 2015-09-07 (×2): 50 ug via INTRAVENOUS

## 2015-09-07 MED ORDER — PROTAMINE SULFATE 10 MG/ML IV SOLN
INTRAVENOUS | Status: DC | PRN
  Administered 2015-09-07: 30 mg via INTRAVENOUS

## 2015-09-07 MED ORDER — ONDANSETRON HCL 4 MG/2ML IJ SOLN
INTRAMUSCULAR | Status: AC
  Filled 2015-09-07: qty 2

## 2015-09-07 SURGICAL SUPPLY — 34 items
BANDAGE ELASTIC 4 VELCRO ST LF (GAUZE/BANDAGES/DRESSINGS) ×2 IMPLANT
BNDG GAUZE ELAST 4 BULKY (GAUZE/BANDAGES/DRESSINGS) ×2 IMPLANT
CANISTER SUCTION 2500CC (MISCELLANEOUS) ×2 IMPLANT
CANNULA VESSEL 3MM 2 BLNT TIP (CANNULA) ×2 IMPLANT
CLIP TI MEDIUM 24 (CLIP) ×2 IMPLANT
CLIP TI WIDE RED SMALL 24 (CLIP) ×2 IMPLANT
ELECT REM PT RETURN 9FT ADLT (ELECTROSURGICAL) ×2
ELECTRODE REM PT RTRN 9FT ADLT (ELECTROSURGICAL) ×1 IMPLANT
GLOVE BIO SURGEON STRL SZ 6.5 (GLOVE) ×8 IMPLANT
GLOVE BIO SURGEON STRL SZ7.5 (GLOVE) ×2 IMPLANT
GLOVE BIOGEL PI IND STRL 6.5 (GLOVE) ×2 IMPLANT
GLOVE BIOGEL PI IND STRL 7.0 (GLOVE) ×2 IMPLANT
GLOVE BIOGEL PI IND STRL 8 (GLOVE) ×1 IMPLANT
GLOVE BIOGEL PI INDICATOR 6.5 (GLOVE) ×2
GLOVE BIOGEL PI INDICATOR 7.0 (GLOVE) ×2
GLOVE BIOGEL PI INDICATOR 8 (GLOVE) ×1
GOWN STRL REUS W/ TWL LRG LVL3 (GOWN DISPOSABLE) ×5 IMPLANT
GOWN STRL REUS W/TWL LRG LVL3 (GOWN DISPOSABLE) ×5
KIT BASIN OR (CUSTOM PROCEDURE TRAY) ×2 IMPLANT
KIT ROOM TURNOVER OR (KITS) ×2 IMPLANT
LIQUID BAND (GAUZE/BANDAGES/DRESSINGS) ×2 IMPLANT
NS IRRIG 1000ML POUR BTL (IV SOLUTION) ×4 IMPLANT
PACK CV ACCESS (CUSTOM PROCEDURE TRAY) ×2 IMPLANT
PAD ARMBOARD 7.5X6 YLW CONV (MISCELLANEOUS) ×4 IMPLANT
PATCH VASC XENOSURE 1CMX6CM (Vascular Products) ×1 IMPLANT
PATCH VASC XENOSURE 1X6 (Vascular Products) ×1 IMPLANT
SUT PROLENE 6 0 BV (SUTURE) ×4 IMPLANT
SUT VIC AB 3-0 SH 27 (SUTURE) ×1
SUT VIC AB 3-0 SH 27X BRD (SUTURE) ×1 IMPLANT
SUT VICRYL 4-0 PS2 18IN ABS (SUTURE) ×2 IMPLANT
TAPE STRIPS DRAPE STRL (GAUZE/BANDAGES/DRESSINGS) ×2 IMPLANT
TOWEL OR 17X24 6PK STRL BLUE (TOWEL DISPOSABLE) ×4 IMPLANT
TOWEL OR 17X26 10 PK STRL BLUE (TOWEL DISPOSABLE) ×2 IMPLANT
WATER STERILE IRR 1000ML POUR (IV SOLUTION) ×2 IMPLANT

## 2015-09-07 NOTE — Anesthesia Preprocedure Evaluation (Addendum)
Anesthesia Evaluation  Patient identified by MRN, date of birth, ID band Patient awake    Reviewed: Allergy & Precautions, NPO status , Patient's Chart, lab work & pertinent test results  Airway Mallampati: II  TM Distance: >3 FB Neck ROM: Full    Dental  (+) Partial Upper, Dental Advisory Given   Pulmonary former smoker,    breath sounds clear to auscultation       Cardiovascular hypertension,  Rhythm:Regular Rate:Normal     Neuro/Psych    GI/Hepatic   Endo/Other  diabetes  Renal/GU      Musculoskeletal   Abdominal   Peds  Hematology   Anesthesia Other Findings   Reproductive/Obstetrics                          Anesthesia Physical Anesthesia Plan  ASA: III  Anesthesia Plan: General   Post-op Pain Management:    Induction: Intravenous  Airway Management Planned: Oral ETT  Additional Equipment:   Intra-op Plan:   Post-operative Plan:   Informed Consent: I have reviewed the patients History and Physical, chart, labs and discussed the procedure including the risks, benefits and alternatives for the proposed anesthesia with the patient or authorized representative who has indicated his/her understanding and acceptance.   Dental advisory given  Plan Discussed with: CRNA and Anesthesiologist  Anesthesia Plan Comments:        Anesthesia Quick Evaluation

## 2015-09-07 NOTE — Telephone Encounter (Signed)
Post op coincided with previously sched appt on 5/24. Added the post-op notes and changed appt type.

## 2015-09-07 NOTE — Progress Notes (Signed)
Utilization review completed.  

## 2015-09-07 NOTE — Progress Notes (Signed)
   VASCULAR SURGERY POSTOP NOTE:  Doing well postop.  Right hand is warm with good Doppler radial signal.  SUBJECTIVE: Pain adequately controlled.  PHYSICAL EXAM: Filed Vitals:   09/07/15 1215 09/07/15 1223 09/07/15 1230 09/07/15 1253  BP:  120/70  118/52  Pulse: 70 75  69  Temp:   97.6 F (36.4 C) 97.8 F (36.6 C)  TempSrc:    Oral  Resp: 11 13  17   Height:      Weight:      SpO2: 100% 100%  99%   Right hand is warm and well-perfused.  LABS: Lab Results  Component Value Date   WBC 6.3 09/07/2015   HGB 11.8* 09/07/2015   HCT 36.7* 09/07/2015   MCV 92.7 09/07/2015   PLT 143* 09/07/2015   Lab Results  Component Value Date   CREATININE 1.57* 09/07/2015   Lab Results  Component Value Date   INR 1.17 05/15/2014   CBG (last 3)   Recent Labs  09/07/15 0959 09/07/15 1119 09/07/15 1203  GLUCAP 72 63* 103*    Active Problems:   Ischemia of upper extremity   Cari Caraway Beeper: [phone] 09/07/2015

## 2015-09-07 NOTE — H&P (View-Only) (Signed)
[facility] [place]  Patient name: Christopher Padilla MRN: [id number] DOB: 01-06-1941 Sex: male  REASON FOR CONSULT: Left hand discoloration and numbness of left hand. Referred by Dr. Redge Gainer.  HPI: Christopher Padilla is a 75 y.o. male, who I have seen in the remote past. I am unable to locate any old records but according to Epic he may have been seen last year in 2004. He underwent coronary revascularization in 2002 in the left radial artery was harvested. He states that about a year ago he began noticing some discoloration in his left hand. 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 This ultimately healed. He has had progressive discoloration of the left hand and was set up for a vascular consultation.  He describes paresthesias in the left hand. He also describes discoloration which improves when he elevates his hand. 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  His risk factors for vascular disease include diabetes, hypertension, and hyperlipidemia. He has a remote history of tobacco use. He denies any family history of premature cardiovascular disease.  He has undergone bilateral below-the-knee amputations.  He has stage III chronic kidney disease. GFR is 52. This was on 07/01/2015. Creatinine was 1.29.  Past Medical History  Diagnosis Date  . CAD (coronary artery disease)     S/P CABG, Feb 2003, LIMA to the LAD, left free radial artery to an obtuse marginal, spahenous vein graft to diagonal, saphenous vein graft to RCA with endarterectomy  . 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 Cerebrovascular disease     MRA in 2005 with 75% stenosis in distal right vertebral artery and 75% stenosis greater in distal cervical internal carotid artery on teh right, severe bilateral disease and MRA of the extracranial circulation, high grade stenosis of the distal right  internal carotid artery at the junction of the cervical internal carotid artery of the skull base  . Diabetes mellitus   . Hyperlipidemia   . Hypothyroidism   . History of tobacco use     Quit in 1990  . Myocardial infarction ([place]) 2002  . GERD (gastroesophageal reflux disease)   . Arthritis   . Cataract   . Complete heart block ([place]) 05/14/2014    St. Jude BiV PM (serial number [id number]) pacemaker  . CKD stage 3 due to type 2 diabetes mellitus ([place])   . Osteopenia 2016  . Hypertension   . Collapsed lung     Family History  Problem Relation Age of Onset  . Cancer Mother     brain and lung  . Diabetes Father 29  . Coronary artery disease Father   . Colon cancer Neg Hx   . Diabetes Sister   . Diabetes Brother   . Diabetes Son     SOCIAL HISTORY: Social History   Social History  . Marital Status: Married    Spouse Name: N/A  . Number of Children: N/A  . Years of Education: N/A   Occupational History  . Retired    Social History Main Topics  . Smoking status: Former Smoker -- 1.00 packs/day    Types: Cigarettes    Start date: 11/04/1954    Quit date: 05/06/1988  . Smokeless tobacco: Never Used  . Alcohol Use: No  . Drug Use: No  . Sexual Activity: Not Currently   Other Topics Concern  . Not on  file   Social History Narrative   Married with 2 children    Allergies  Allergen Reactions  . Codeine     hallucinations  . Ciprofloxacin Diarrhea  . Nsaids Other (See Comments)    GI upset    Current Outpatient Prescriptions  Medication Sig Dispense Refill  . ACCU-CHEK SOFTCLIX LANCETS lancets Check BG 4Xdaily DX E11.65 300 each 1  . aspirin 81 MG tablet Take 81 mg by mouth daily.      . Blood Glucose Monitoring Suppl (ACCU-CHEK AVIVA PLUS) W/DEVICE KIT Check BG 4XDaily DX E11.65 1 kit 0  . Cholecalciferol (VITAMIN D PO) Take 1,000 Units by mouth daily.     Marland Kitchen COREG CR 10 MG 24 hr capsule TAKE 1 CAPSULE (10 MG TOTAL) BY MOUTH DAILY. 30 capsule 2  . gabapentin  (NEURONTIN) 600 MG tablet TAKE 1 TABLET THREE TIMES A DAY 90 tablet 1  . glucose blood (ACCU-CHEK AVIVA PLUS) test strip Check BG 4xdaily DX Ell.65 300 each 1  . insulin aspart (NOVOLOG) 100 UNIT/ML injection Inject 0-20 units as directed per sliding scale (Patient taking differently: Inject 0-20 Units into the skin 3 (three) times daily with meals. Inject 0-20 units as directed per sliding scale) 30 mL 6  . Insulin Syringe-Needle U-100 (BD INSULIN SYRINGE ULTRAFINE) 31G X 15/64" 0.5 ML MISC Use once daily 100 each 1  . LANTUS 100 UNIT/ML injection INJECT 4O UNITS SUBCUTANEOUSLY AT BEDTIME  2  . levothyroxine (SYNTHROID, LEVOTHROID) 125 MCG tablet TAKE 1 TABLET (125 MCG TOTAL) BY MOUTH DAILY. 30 tablet 1  . nortriptyline (PAMELOR) 25 MG capsule TAKE 1 CAPSULE (25 MG TOTAL) BY MOUTH AT BEDTIME. 30 capsule 1  . omeprazole (PRILOSEC) 20 MG capsule TAKE 1 CAPSULE BY MOUTH DAILY 30 capsule 1  . simvastatin (ZOCOR) 40 MG tablet TAKE 1 TABLET (40 MG TOTAL) BY MOUTH AT BEDTIME. 90 tablet 3  . tetrahydrozoline (EYE DROPS) 0.05 % ophthalmic solution Apply 1-2 drops to eye daily.    . Chlorphen-Pseudoephed-APAP (COLD MEDICINE PLUS) 2-30-325 MG CAPS Take 1-2 capsules by mouth daily as needed (for cold and cough). Reported on 08/09/2015     No current facility-administered medications for this visit.    REVIEW OF SYSTEMS:  _0  denotes positive finding, _1  denotes negative finding Cardiac  Comments:  Chest pain or chest pressure:    Shortness of breath upon exertion:    Short of breath when lying flat:    Irregular heart rhythm:        Vascular    Pain in calf, thigh, or hip brought on by ambulation:    Pain in feet at night that wakes you up from your sleep:     Blood clot in your veins:    Leg swelling:         Pulmonary    Oxygen at home:    Productive cough:     Wheezing:         Neurologic    Sudden weakness in arms or legs:     Sudden numbness in arms or legs:     Sudden onset of  difficulty speaking or slurred speech:    Temporary loss of vision in one eye:     Problems with dizziness:         Gastrointestinal    Blood in stool:     Vomited blood:         Genitourinary    Burning when urinating:     Blood  in urine:        Psychiatric    Major depression:         Hematologic    Bleeding problems:    Problems with blood clotting too easily:        Skin    Rashes or ulcers:        Constitutional    Fever or chills:      PHYSICAL EXAM: Filed Vitals:   08/09/15 1550  BP: 135/66  Pulse: 80  Temp: 98.4 F (36.9 C)  TempSrc: Oral  Resp: 16  Height: _0  (1.727 m)  Weight: 158 lb 8 oz (71.895 kg)  SpO2: 95%    GENERAL: The patient is a well-nourished male, in no acute distress. The vital signs are documented above. CARDIAC: There is a regular rate and rhythm.  VASCULAR: I do not detect carotid bruits. He has a radial ulnar and palmar arch signal in the left hand with the Doppler. These are monophasic. I cannot palpate a right femoral pulse. Does have a diminished but palpable left femoral pulse. PULMONARY: There is good air exchange bilaterally without wheezing or rales. ABDOMEN: Soft and non-tender with normal pitched bowel sounds.  MUSCULOSKELETAL: He has bilateral below-the-knee amputations. He is had previous amputation of the left index finger. NEUROLOGIC: No focal weakness or paresthesias are detected. SKIN: He does have some dependent rubor of the left hand. PSYCHIATRIC: The patient has a normal affect.  DATA:   UPPER EXTREMITY ARTERIAL DUPLEX: I have independently interpreted his upper extremity arterial duplex of the left upper extremity. This shows biphasic Doppler flow in the left subclavian and axillary arteries and monophasic flow from the proximal brachial artery and distal to that. The radial artery has been previously harvested. There is a monophasic ulnar signal with the Doppler proximally but no ulnar signal distally.  There  is plaque noted throughout the brachial artery on the left. There is no flow noted in the distal ulnar artery.  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 The radial artery has been harvested for previous CABG. The ulnar artery appears to occlude distally. I'm not sure if he has any options for revascularization however I have recommended that we proceed with an arteriogram. We will have him come in early in the morning and do him as the third case of that he can be hydrated in the morning. If he has any options for revascularization, then I will need to see him back in the office for bilateral great saphenous vein mapping of both thighs. Fortunately he is not a smoker. I have reviewed with the patient the indications for arteriography. In addition, I have reviewed the potential complications of arteriography including but not limited to: Bleeding, arterial injury, arterial thrombosis, dye action, renal insufficiency, or other unpredictable medical problems. I have explained to the patient that if we find disease amenable to angioplasty we could potentially address this at the same time. I have discussed the potential complications of angioplasty and stenting, including but not limited to: Bleeding, arterial thrombosis, arterial injury, dissection, or the need for surgical intervention.  Deitra Mayo [facility] [place]: [phone]

## 2015-09-07 NOTE — Anesthesia Postprocedure Evaluation (Signed)
Anesthesia Post Note  Patient: Christopher Padilla  Procedure(s) Performed: Procedure(s) (LRB): ENDARTERECTOMY LEFT  BRACHIAL ARTERY  (Left) LEFT BRACHIAL ARTERY PATCH ANGIOPLASTY USING XENOSURE BIOLOGIC PATCH (Left)  Patient location during evaluation: PACU Anesthesia Type: General Level of consciousness: awake, awake and alert and oriented Pain management: pain level controlled Vital Signs Assessment: post-procedure vital signs reviewed and stable Respiratory status: spontaneous breathing, nonlabored ventilation and respiratory function stable Cardiovascular status: blood pressure returned to baseline Anesthetic complications: no    Last Vitals:  Filed Vitals:   09/07/15 1108 09/07/15 1115  BP: 112/55   Pulse: 68 72  Temp:    Resp: 12 15    Last Pain:  Filed Vitals:   09/07/15 1130  PainSc: 0-No pain                 Anand Tejada COKER

## 2015-09-07 NOTE — Anesthesia Procedure Notes (Signed)
Procedure Name: Intubation Date/Time: 09/07/2015 7:43 AM Performed by: Ferol Luz L Pre-anesthesia Checklist: Patient identified, Emergency Drugs available, Suction available and Patient being monitored Patient Re-evaluated:Patient Re-evaluated prior to inductionOxygen Delivery Method: Circle System Utilized Preoxygenation: Pre-oxygenation with 100% oxygen Intubation Type: IV induction and Cricoid Pressure applied Ventilation: Mask ventilation with difficulty Laryngoscope Size: Mac and 3 Grade View: Grade III Tube type: Oral Tube size: 7.5 mm Number of attempts: 1 Airway Equipment and Method: Stylet and Oral airway Placement Confirmation: ETT inserted through vocal cords under direct vision,  positive ETCO2 and breath sounds checked- equal and bilateral Secured at: 21 cm Tube secured with: Tape Dental Injury: Teeth and Oropharynx as per pre-operative assessment  Difficulty Due To: Difficult Airway- due to reduced neck mobility, Difficulty was anticipated and Difficult Airway- due to limited oral opening Future Recommendations: Recommend- induction with short-acting agent, and alternative techniques readily available

## 2015-09-07 NOTE — Op Note (Signed)
    NAME: Christopher Padilla   MRN: [id number] DOB: Jun 21, 1940    DATE OF OPERATION: 09/07/2015  PREOP DIAGNOSIS: ischemic left upper extremity  POSTOP DIAGNOSIS: same  PROCEDURE: Left brachial artery endarterectomy and bovine pericardial patch angioplasty  SURGEON: Di Kindle. [name], MD, FACS  ASSIST: Doreatha Massed, PA  ANESTHESIA: Gen.   EBL: minimal  INDICATIONS: [name] is a 75 y.o. 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 He underwent an arteriogram which showed a tight brachial artery stenosis. He does have distal disease but no options distally for revascularization. I recommended endarterectomy and patch angioplasty  FINDINGS: There was an excellent radial signal at the completion of the procedure.  TECHNIQUE: The patient was taken to the operating room and received a general anesthetic. The left upper extremity was prepped and draped in usual sterile fashion. A longitudinal incision was made over the medial aspect of left upper arm and the dissection carried down to the brachial artery. The area of stenosis was identified with the Doppler. He did have moderate diffuse disease. The patient was heparinized. The artery was clamped proximally and distally. Longitudinal arteriotomy was made through the area of stenosis. I had mixed in the incision fairly far proximally and distally in order to get a decent endpoint. An endarterectomy plane was established in the endarterectomy performed. Distally there was some residual plaque which was divided and then tacked. The bovine pericardial patch was soaked in saline. It was then tailored and sewn using continuous 6-0 Prolene suture. Prior to completing the anastomosis the artery was backbled and flushed appropriately and the anastomosis completed. At the completion was an excellent radial signal WITH THE Doppler. The heparin was partially reversed with protamine. The wound was closed with 2 deep  layers of 3-0 Vicryl. The skin was closed with 4-0 Vicryl. Sterile dressing was applied. Patient tolerated procedure well and transferred to the recovery room in stable condition. All needle and sponge counts were correct.  [name], MD, FACS [facility]  DATE OF DICTATION:   09/07/2015

## 2015-09-07 NOTE — Transfer of Care (Signed)
Immediate Anesthesia Transfer of Care Note  Patient: Christopher Padilla  Procedure(s) Performed: Procedure(s): ENDARTERECTOMY LEFT  BRACHIAL ARTERY  (Left) LEFT BRACHIAL ARTERY PATCH ANGIOPLASTY USING XENOSURE BIOLOGIC PATCH (Left)  Patient Location: PACU  Anesthesia Type:General  Level of Consciousness: sedated, patient cooperative and responds to stimulation  Airway & Oxygen Therapy: Patient Spontanous Breathing and Patient connected to nasal cannula oxygen  Post-op Assessment: Report given to RN and Post -op Vital signs reviewed and stable  Post vital signs: Reviewed and stable  Last Vitals:  Filed Vitals:   09/07/15 0618  BP: 155/53  Pulse: 82  Resp: 18    Last Pain: There were no vitals filed for this visit.    Patients Stated Pain Goal: 3 (09/07/15 0704)  Complications: No apparent anesthesia complications

## 2015-09-07 NOTE — Interval H&P Note (Signed)
History and Physical Interval Note:  09/07/2015 7:32 AM  Christopher Padilla  has presented today for surgery, with the diagnosis of Left hand numbness  The various methods of treatment have been discussed with the patient and family. After consideration of risks, benefits and other options for treatment, the patient has consented to  Procedure(s): ENDARTERECTOMY BRACHIAL ARTERY WITH PATCH ANGIOPLASTY (Left) as a surgical intervention .  The patient's history has been reviewed, patient examined, no change in status, stable for surgery.  I have reviewed the patient's chart and labs.  Questions were answered to the patient's satisfaction.     [name]

## 2015-09-07 NOTE — Telephone Encounter (Signed)
-----   Message from Sharee Pimple, RN sent at 09/07/2015  1:09 PM EDT ----- Regarding: schedule   ----- Message -----    From: Chuck Hint, MD    Sent: 09/07/2015   9:43 AM      To: [facility] Charge Pool Subject: charge                                         PROCEDURE: Left brachial artery endarterectomy and bovine pericardial patch angioplasty  SURGEON: Di Kindle. [name], MD, FACS  ASSIST: Doreatha Massed, PA  He will need a follow up visit in 3-4 weeks. Thank you. CD

## 2015-09-08 ENCOUNTER — Encounter (HOSPITAL_COMMUNITY): Payer: Self-pay | Admitting: Vascular Surgery

## 2015-09-08 LAB — BASIC METABOLIC PANEL
Anion gap: 9 (ref 5–15)
BUN: 22 mg/dL — ABNORMAL HIGH (ref 6–20)
CALCIUM: 8.1 mg/dL — AB (ref 8.9–10.3)
CO2: 24 mmol/L (ref 22–32)
CREATININE: 1.69 mg/dL — AB (ref 0.61–1.24)
Chloride: 101 mmol/L (ref 101–111)
GFR calc non Af Amer: 38 mL/min — ABNORMAL LOW (ref >60)
GFR, EST AFRICAN AMERICAN: 44 mL/min — AB (ref >60)
Glucose, Bld: 301 mg/dL — ABNORMAL HIGH (ref 65–99)
Potassium: 5.8 mmol/L — ABNORMAL HIGH (ref 3.5–5.1)
SODIUM: 134 mmol/L — AB (ref 135–145)

## 2015-09-08 LAB — CBC
HCT: 37.8 % — ABNORMAL LOW (ref 39.0–52.0)
Hemoglobin: 12.2 g/dL — ABNORMAL LOW (ref 13.0–17.0)
MCH: 30.2 pg (ref 26.0–34.0)
MCHC: 32.3 g/dL (ref 30.0–36.0)
MCV: 93.6 fL (ref 78.0–100.0)
Platelets: 133 10*3/uL — ABNORMAL LOW (ref 150–400)
RBC: 4.04 MIL/uL — ABNORMAL LOW (ref 4.22–5.81)
RDW: 14.2 % (ref 11.5–15.5)
WBC: 6.5 10*3/uL (ref 4.0–10.5)

## 2015-09-08 LAB — GLUCOSE, CAPILLARY
GLUCOSE-CAPILLARY: 290 mg/dL — AB (ref 65–99)
Glucose-Capillary: 299 mg/dL — ABNORMAL HIGH (ref 65–99)

## 2015-09-08 MED ORDER — TRAMADOL HCL 50 MG PO TABS: 50.0000 mg | ORAL_TABLET | Freq: Four times a day (QID) | ORAL | Status: DC | PRN

## 2015-09-08 NOTE — Progress Notes (Signed)
    Subjective  - POD #1  Feels ok this am.  Did get dizzy with sitting up   Physical Exam:  Good doppler signals in left radial     Assessment/Plan:  POD #1  Anticipate d/c today if is not orthostatic  Mckenzee Beem, [place] 09/08/2015 8:01 AM --  Filed Vitals:   09/07/15 2126 09/08/15 0432  BP: 127/56 142/51  Pulse: 78 80  Temp: 98.1 F (36.7 C) 98.2 F (36.8 C)  Resp: 18 18    Intake/Output Summary (Last 24 hours) at 09/08/15 0801 Last data filed at 09/07/15 2000  Gross per 24 hour  Intake    340 ml  Output    450 ml  Net   -110 ml     Laboratory CBC    Component Value Date/Time   WBC 6.5 09/08/2015 0300   WBC 4.9 08/14/2015 0903   WBC 6.9 10/21/2014 1539   HGB 12.2* 09/08/2015 0300   HGB 13.1* 10/21/2014 1539   HCT 37.8* 09/08/2015 0300   HCT 41.5 08/14/2015 0903   HCT 42.7* 10/21/2014 1539   PLT 133* 09/08/2015 0300   PLT 173 08/14/2015 0903    BMET    Component Value Date/Time   NA 134* 09/08/2015 0300   NA 137 08/14/2015 0903   K 5.8* 09/08/2015 0300   CL 101 09/08/2015 0300   CO2 24 09/08/2015 0300   GLUCOSE 301* 09/08/2015 0300   GLUCOSE 126* 08/14/2015 0903   BUN 22* 09/08/2015 0300   BUN 16 08/14/2015 0903   CREATININE 1.69* 09/08/2015 0300   CREATININE 1.22 09/29/2012 0831   CALCIUM 8.1* 09/08/2015 0300   GFRNONAA 38* 09/08/2015 0300   GFRNONAA 58* 09/29/2012 0831   GFRAA 44* 09/08/2015 0300   GFRAA 68 09/29/2012 0831    COAG Lab Results  Component Value Date   INR 1.17 05/15/2014   INR 1.12 05/14/2014   INR 1.09 05/14/2014   No results found for: PTT  Antibiotics Anti-infectives    Start     Dose/Rate Route Frequency Ordered Stop   09/07/15 1600  cefUROXime (ZINACEF) 1.5 g in dextrose 5 % 50 mL IVPB     1.5 g 100 mL/hr over 30 Minutes Intravenous Every 12 hours 09/07/15 1243 09/08/15 0547   09/07/15 0625  cefUROXime (ZINACEF) 1.5 g in dextrose 5 % 50 mL IVPB     1.5 g 100 mL/hr over 30 Minutes Intravenous 30 min pre-op  09/07/15 0625 09/07/15 0805       V. Charlena Cross, M.D. [facility] [place] Office: [phone] Pager:  [phone]

## 2015-09-08 NOTE — Progress Notes (Signed)
Inpatient Diabetes Program Recommendations  AACE/ADA: New Consensus Statement on Inpatient Glycemic Control (2015)  Target Ranges:  Prepandial:   less than 140 mg/dL      Peak postprandial:   less than 180 mg/dL (1-2 hours)      Critically ill patients:  140 - 180 mg/dL   Review of Glycemic Control  Diabetes history: DM 2 Outpatient Diabetes medications: Lantus 32 units, Novolog Resistant TID Current orders for Inpatient glycemic control: Novolog Moderate TID  Inpatient Diabetes Program Recommendations: Insulin - Basal: Patient's glucose elevated this am at 299 mg/dl. Patient takes Lantus 32 units QHS at home. Please consider ordering Lantus at half dose while inaptient, Lantus 16-20 units Daily.  Thanks,  Christena Deem RN, MSN, [place] [facility] Inpatient Diabetes Coordinator Team Pager [phone] (8a-5p)

## 2015-09-12 NOTE — Discharge Summary (Signed)
[facility] Discharge Summary   Patient ID:  Christopher Padilla MRN: [id number] DOB/AGE: 1940-11-23 75 y.o.  Admit date: 09/07/2015 Discharge date:  Date of Surgery: 09/07/2015 Surgeon: Surgeon(s): Angelia Mould, MD  Admission Diagnosis: Left hand numbness  Discharge Diagnoses:  Left hand numbness  Secondary Diagnoses: Past Medical History  Diagnosis Date  . CAD (coronary artery disease)     S/P CABG, Feb 2003, LIMA to the LAD, left free radial artery to an obtuse marginal, spahenous vein graft to diagonal, saphenous vein graft to RCA with endarterectomy  . Cerebrovascular disease     MRA in 2005 with 75% stenosis in distal right vertebral artery and 75% stenosis greater in distal cervical internal carotid artery on teh right, severe bilateral disease and MRA of the extracranial circulation, high grade stenosis of the distal right internal carotid artery at the junction of the cervical internal carotid artery of the skull base  . Hyperlipidemia   . Hypothyroidism   . GERD (gastroesophageal reflux disease)   . Complete heart block ([place]) 05/14/2014    St. Jude BiV PM (serial number [id number]) pacemaker  . CKD stage 3 due to type 2 diabetes mellitus ([place])   . Osteopenia 2016  . Hypertension   . Collapsed lung   . Presence of permanent cardiac pacemaker   . Type II diabetes mellitus ([place])   . Myocardial infarction ([place]) 2002  . History of blood transfusion 2002    "after my heart attack"  . History of stomach ulcers 1960s  . Arthritis     "left hand" (08/21/2015)  . History of tobacco use     Quit in 1990  . PONV (postoperative nausea and vomiting)   . Peripheral vascular disease ([place])     left hand    Procedure(s): ENDARTERECTOMY LEFT  BRACHIAL ARTERY  LEFT BRACHIAL ARTERY PATCH ANGIOPLASTY USING XENOSURE BIOLOGIC PATCH  Discharged Condition: good  HPI: Christopher Padilla is a 75 y.o. male, who I have seen in the remote past. I am unable to locate any  old records but according to Epic he may have been seen last year in 2004. He underwent coronary revascularization in 2002 in the left radial artery was harvested. He states that about a year ago he began noticing some discoloration in his left hand. 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 This ultimately healed. He has had progressive discoloration of the left hand and was set up for a vascular consultation.  He describes paresthesias in the left hand. He also describes discoloration which improves when he elevates his hand. 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  His risk factors for vascular disease include diabetes, hypertension, and hyperlipidemia. He has a remote history of tobacco use. He denies any family history of premature cardiovascular disease.  He has undergone bilateral below-the-knee amputations.  He has stage III chronic kidney disease. GFR is 52. This was on 07/01/2015. Creatinine was 1.29.   Hospital Course:  Christopher Padilla is a 75 y.o. male is S/P  Procedure(s): ENDARTERECTOMY LEFT  BRACHIAL ARTERY  LEFT BRACHIAL ARTERY PATCH ANGIOPLASTY USING XENOSURE BIOLOGIC PATCH  POD#1 Incision clean and dry Good doppler signal left radial No dizziness, pain well controlled with tramadol F/U in 2 weeks with Dr. Scot Dock  Consults:  Treatment Team:  Serafina Mitchell, MD  Significant Diagnostic Studies: CBC Lab Results  Component Value Date   WBC  6.5 09/08/2015   HGB 12.2* 09/08/2015   HCT 37.8* 09/08/2015   MCV 93.6 09/08/2015   PLT 133* 09/08/2015    BMET    Component Value Date/Time   NA 134* 09/08/2015 0300   NA 137 08/14/2015 0903   K 5.8* 09/08/2015 0300   CL 101 09/08/2015 0300   CO2 24 09/08/2015 0300   GLUCOSE 301* 09/08/2015 0300   GLUCOSE 126* 08/14/2015 0903   BUN 22* 09/08/2015 0300   BUN 16 08/14/2015 0903   CREATININE 1.69* 09/08/2015 0300   CREATININE  1.22 09/29/2012 0831   CALCIUM 8.1* 09/08/2015 0300   GFRNONAA 38* 09/08/2015 0300   GFRNONAA 58* 09/29/2012 0831   GFRAA 44* 09/08/2015 0300   GFRAA 68 09/29/2012 0831   COAG Lab Results  Component Value Date   INR 1.17 05/15/2014   INR 1.12 05/14/2014   INR 1.09 05/14/2014     Disposition:  Discharge to :Home Discharge Instructions    Call MD for:  redness, tenderness, or signs of infection (pain, swelling, bleeding, redness, odor or green/yellow discharge around incision site)    Complete by:  As directed      Call MD for:  severe or increased pain, loss or decreased feeling  in affected limb(s)    Complete by:  As directed      Call MD for:  temperature >100.5    Complete by:  As directed      Discharge patient    Complete by:  As directed   Discharge pt to home     Discharge wound care:    Complete by:  As directed   Shower daily with soap and water starting 09/09/15     Driving Restrictions    Complete by:  As directed   No driving for 48 hours and while taking pain medication.     Lifting restrictions    Complete by:  As directed   No lifting for 3 weeks     Resume previous diet    Complete by:  As directed             Medication List    TAKE these medications        ACCU-CHEK AVIVA PLUS w/Device Kit  Check BG 4XDaily DX E11.65     ACCU-CHEK SOFTCLIX LANCETS lancets  Check BG 4Xdaily DX E11.65     aspirin 81 MG tablet  Take 81 mg by mouth daily.     COLD MEDICINE PLUS 2-30-325 MG Caps  Generic drug:  Chlorphen-Pseudoephed-APAP  Take 1-2 capsules by mouth daily as needed (for cold and cough). Reported on 08/09/2015     COREG CR 10 MG 24 hr capsule  Generic drug:  carvedilol  TAKE 1 CAPSULE (10 MG TOTAL) BY MOUTH DAILY.     EYE DROPS 0.05 % ophthalmic solution  Generic drug:  tetrahydrozoline  Apply 1-2 drops to eye daily.     gabapentin 600 MG tablet  Commonly known as:  NEURONTIN  TAKE 1 TABLET THREE TIMES A DAY     glucose blood test strip   Commonly known as:  ACCU-CHEK AVIVA PLUS  Check BG 4xdaily DX Ell.65     insulin aspart 100 UNIT/ML injection  Commonly known as:  NOVOLOG  Inject 0-20 units as directed per sliding scale     insulin glargine 100 UNIT/ML injection  Commonly known as:  LANTUS  Inject 32 Units into the skin at bedtime.     Insulin Syringe-Needle U-100 31G X  15/64" 0.5 ML Misc  Commonly known as:  BD INSULIN SYRINGE ULTRAFINE  Use once daily     levothyroxine 125 MCG tablet  Commonly known as:  SYNTHROID, LEVOTHROID  TAKE 1 TABLET (125 MCG TOTAL) BY MOUTH DAILY.     nortriptyline 25 MG capsule  Commonly known as:  PAMELOR  TAKE 1 CAPSULE (25 MG TOTAL) BY MOUTH AT BEDTIME.     omeprazole 20 MG capsule  Commonly known as:  PRILOSEC  TAKE 1 CAPSULE BY MOUTH DAILY     simvastatin 40 MG tablet  Commonly known as:  ZOCOR  TAKE 1 TABLET (40 MG TOTAL) BY MOUTH AT BEDTIME.     traMADol 50 MG tablet  Commonly known as:  ULTRAM  Take 1 tablet (50 mg total) by mouth every 6 (six) hours as needed for moderate pain.     traMADol 50 MG tablet  Commonly known as:  ULTRAM  Take 1 tablet (50 mg total) by mouth every 6 (six) hours as needed.     VITAMIN D PO  Take 1,000 Units by mouth daily.       Verbal and written Discharge instructions given to the patient. Wound care per Discharge AVS     Follow-up Information    Follow up with Deitra Mayo, MD In 2 weeks.   Specialties:  Vascular Surgery, Cardiology   Why:  Office will call you to arrange your appt (sent)   Contact information:   [place] [address] [phone]       Signed: Laurence Slate [facility] 09/12/2015, 9:07 AM

## 2015-09-13 ENCOUNTER — Other Ambulatory Visit: Payer: Self-pay | Admitting: Family Medicine

## 2015-09-15 ENCOUNTER — Other Ambulatory Visit: Payer: Self-pay | Admitting: Family Medicine

## 2015-09-18 ENCOUNTER — Encounter: Payer: Self-pay | Admitting: Vascular Surgery

## 2015-09-20 ENCOUNTER — Encounter: Payer: Self-pay | Admitting: Vascular Surgery

## 2015-09-20 ENCOUNTER — Ambulatory Visit (HOSPITAL_COMMUNITY)
Admission: RE | Admit: 2015-09-20 | Discharge: 2015-09-20 | Disposition: A | Discharge status: Home / Self Care | Payer: PPO | Source: Ambulatory Visit | Attending: Vascular Surgery | Admitting: Vascular Surgery

## 2015-09-20 ENCOUNTER — Ambulatory Visit (INDEPENDENT_AMBULATORY_CARE_PROVIDER_SITE_OTHER): Payer: PPO | Admitting: Vascular Surgery

## 2015-09-20 VITALS — BP 107/58 | HR 86 | Temp 97.4°F | Resp 16 | Ht 68.0 in | Wt 160.0 lb

## 2015-09-20 DIAGNOSIS — Z48812 Encounter for surgical aftercare following surgery on the circulatory system: Secondary | ICD-10-CM

## 2015-09-20 DIAGNOSIS — M79602 Pain in left arm: Secondary | ICD-10-CM

## 2015-09-20 DIAGNOSIS — I82622 Acute embolism and thrombosis of deep veins of left upper extremity: Secondary | ICD-10-CM | POA: Insufficient documentation

## 2015-09-20 NOTE — Progress Notes (Signed)
Patient name: Christopher Padilla MRN: [id number] DOB: 03-18-1941 Sex: male  REASON FOR VISIT: Follow up after left brachial artery endarterectomy  HPI: Christopher Padilla is a 75 y.o. male with chronic ischemia of his left upper extremity. This had gotten progressively worse. He underwent an arteriogram which showed a tight brachial artery stenosis. He did have significant distal disease and the only real option for revascularization was endarterectomy of the stenosis with patch angioplasty.  On 09/07/2015, he underwent left brachial artery endarterectomy with bovine pericardial patch angioplasty. He comes in today with his only complaint being significant left arm swelling. This occurred gradually. He denies any hand pain or paresthesias.  Current Outpatient Prescriptions  Medication Sig Dispense Refill  . ACCU-CHEK SOFTCLIX LANCETS lancets Check BG 4Xdaily DX E11.65 300 each 1  . aspirin 81 MG tablet Take 81 mg by mouth daily.      . Blood Glucose Monitoring Suppl (ACCU-CHEK AVIVA PLUS) W/DEVICE KIT Check BG 4XDaily DX E11.65 1 kit 0  . Cholecalciferol (VITAMIN D PO) Take 1,000 Units by mouth daily.     Marland Kitchen COREG CR 10 MG 24 hr capsule TAKE 1 CAPSULE (10 MG TOTAL) BY MOUTH DAILY. 30 capsule 2  . gabapentin (NEURONTIN) 600 MG tablet TAKE 1 TABLET THREE TIMES A DAY 90 tablet 1  . glucose blood (ACCU-CHEK AVIVA PLUS) test strip Check BG 4xdaily DX Ell.65 300 each 1  . insulin aspart (NOVOLOG) 100 UNIT/ML injection Inject 0-20 units as directed per sliding scale (Patient taking differently: Inject 0-20 Units into the skin 3 (three) times daily with meals. Inject 0-20 units as directed per sliding scale) 30 mL 6  . insulin glargine (LANTUS) 100 UNIT/ML injection Inject 32 Units into the skin at bedtime.    . Insulin Syringe-Needle U-100 (BD INSULIN SYRINGE ULTRAFINE) 31G X 15/64" 0.5 ML MISC Use once daily 100 each 1  . levothyroxine (SYNTHROID, LEVOTHROID) 125 MCG tablet TAKE 1 TABLET (125 MCG TOTAL)  BY MOUTH DAILY. 30 tablet 2  . nortriptyline (PAMELOR) 25 MG capsule TAKE 1 CAPSULE (25 MG TOTAL) BY MOUTH AT BEDTIME. 30 capsule 1  . omeprazole (PRILOSEC) 20 MG capsule TAKE 1 CAPSULE BY MOUTH DAILY 30 capsule 4  . simvastatin (ZOCOR) 40 MG tablet TAKE 1 TABLET (40 MG TOTAL) BY MOUTH AT BEDTIME. 90 tablet 3  . tetrahydrozoline (EYE DROPS) 0.05 % ophthalmic solution Apply 1-2 drops to eye daily.    . Chlorphen-Pseudoephed-APAP (COLD MEDICINE PLUS) 2-30-325 MG CAPS Take 1-2 capsules by mouth daily as needed (for cold and cough). Reported on 09/20/2015    . traMADol (ULTRAM) 50 MG tablet Take 1 tablet (50 mg total) by mouth every 6 (six) hours as needed for moderate pain. (Patient not taking: Reported on 09/20/2015) 30 tablet 0  . traMADol (ULTRAM) 50 MG tablet Take 1 tablet (50 mg total) by mouth every 6 (six) hours as needed. (Patient not taking: Reported on 09/20/2015) 8 tablet 0   No current facility-administered medications for this visit.   Facility-Administered Medications Ordered in Other Visits  Medication Dose Route Frequency Provider Last Rate Last Dose  . 0.9 %  sodium chloride infusion   Intravenous Continuous Angelia Mould, MD        REVIEW OF SYSTEMS:  [X]  denotes positive finding, [ ]  denotes negative finding Cardiac  Comments:  Chest pain or chest pressure:    Shortness of breath upon exertion:    Short of breath when lying flat:    Irregular  heart rhythm:    Constitutional    Fever or chills:      PHYSICAL EXAM: Filed Vitals:   09/20/15 1121  BP: 107/58  Pulse: 86  Temp: 97.4 F (36.3 C)  Resp: 16  Height: 5' 8"  (1.727 m)  Weight: 160 lb (72.576 kg)  SpO2: 95%    GENERAL: The patient is a well-nourished male, in no acute distress. The vital signs are documented above. CARDIOVASCULAR: There is a regular rate and rhythm. PULMONARY: There is good air exchange bilaterally without wheezing or rales. He has an excellent left radial signal with the Doppler  and a monophasic ulnar and palmar arch signal with the Doppler.  He has significant left upper extremity swelling.  LEFT UPPER EXTREMITY VENOUS DUPLEX: Given his significant left upper extremity swelling, I ordered a venous duplex scan today. He does not have any significant upper extremity DVT on the left. He does have some clot in the radial veins which is likely chronic. He does have a seroma in the upper arm.  MEDICAL ISSUES:  STATUS POST LEFT BRACHIAL ARTERY ENDARTERECTOMY: I am happy with the arterial flow in the left arm. With respect to the swelling in the arm I have encouraged him to elevate it as much as possible. He is due back for a carotid duplex scan L check on his arm at that time to next week. In the meantime continue to elevate his arm is much as possible.  Deitra Mayo [facility] [place]: [phone]

## 2015-09-25 ENCOUNTER — Encounter: Payer: Self-pay | Admitting: Vascular Surgery

## 2015-09-26 ENCOUNTER — Other Ambulatory Visit: Payer: Self-pay | Admitting: *Deleted

## 2015-09-26 DIAGNOSIS — I6523 Occlusion and stenosis of bilateral carotid arteries: Secondary | ICD-10-CM

## 2015-09-27 ENCOUNTER — Ambulatory Visit (HOSPITAL_COMMUNITY)
Admission: RE | Admit: 2015-09-27 | Discharge: 2015-09-27 | Disposition: A | Discharge status: Home / Self Care | Payer: PPO | Source: Ambulatory Visit | Attending: Vascular Surgery | Admitting: Vascular Surgery

## 2015-09-27 ENCOUNTER — Encounter: Payer: Self-pay | Admitting: Vascular Surgery

## 2015-09-27 ENCOUNTER — Ambulatory Visit (INDEPENDENT_AMBULATORY_CARE_PROVIDER_SITE_OTHER): Payer: PPO | Admitting: Vascular Surgery

## 2015-09-27 VITALS — BP 124/68 | HR 77 | Temp 97.8°F | Ht 68.0 in | Wt 161.0 lb

## 2015-09-27 DIAGNOSIS — N189 Chronic kidney disease, unspecified: Secondary | ICD-10-CM | POA: Diagnosis not present

## 2015-09-27 DIAGNOSIS — E785 Hyperlipidemia, unspecified: Secondary | ICD-10-CM | POA: Insufficient documentation

## 2015-09-27 DIAGNOSIS — K219 Gastro-esophageal reflux disease without esophagitis: Secondary | ICD-10-CM | POA: Insufficient documentation

## 2015-09-27 DIAGNOSIS — E1122 Type 2 diabetes mellitus with diabetic chronic kidney disease: Secondary | ICD-10-CM | POA: Insufficient documentation

## 2015-09-27 DIAGNOSIS — I6523 Occlusion and stenosis of bilateral carotid arteries: Secondary | ICD-10-CM | POA: Diagnosis not present

## 2015-09-27 DIAGNOSIS — I129 Hypertensive chronic kidney disease with stage 1 through stage 4 chronic kidney disease, or unspecified chronic kidney disease: Secondary | ICD-10-CM | POA: Diagnosis not present

## 2015-09-27 DIAGNOSIS — I2510 Atherosclerotic heart disease of native coronary artery without angina pectoris: Secondary | ICD-10-CM | POA: Diagnosis not present

## 2015-09-27 DIAGNOSIS — Z48812 Encounter for surgical aftercare following surgery on the circulatory system: Secondary | ICD-10-CM

## 2015-09-27 NOTE — Progress Notes (Signed)
[facility] of [place]  Patient name: Christopher Padilla MRN: [id number] DOB: Jan 03, 1941 Sex: male  REASON FOR VISIT: Left arm check and follow-up of carotid artery disease  HPI: [name] is a 75 y.o. male who presents for follow-up of his left arm status post left brachial artery endarterectomy and bovine pericardial patch angioplasty on 09/07/2015. He was last seen in the office on 09/20/2015 where he had significant left arm swelling. He was advised to elevate his left arm is much as possible. A left upper extremity venous duplex at that time revealed no DVT. There was some clot in the radial veins which were likely chronic. He also had a seroma in the upper arm.  Today, he describes that his left arm swelling is essentially the same. He has tried elevating his arm as much as possible. He reports discomfort in the left upper arm where his incision is. He reports some numbness and erythema of his left hand and arm which is the same as before surgery.  Regarding his carotid artery disease, he denies any amaurosis fugax, sudden onset weakness or numbness of the extremities, and expressive or receptive aphasia. He is on aspirin and a statin daily. He is not a smoker.  Past Medical History  Diagnosis Date  . CAD (coronary artery disease)     S/P CABG, Feb 2003, LIMA to the LAD, left free radial artery to an obtuse marginal, spahenous vein graft to diagonal, saphenous vein graft to RCA with endarterectomy  . Cerebrovascular disease     MRA in 2005 with 75% stenosis in distal right vertebral artery and 75% stenosis greater in distal cervical internal carotid artery on teh right, severe bilateral disease and MRA of the extracranial circulation, high grade stenosis of the distal right internal carotid artery at the junction of the cervical internal carotid artery of the skull base  . Hyperlipidemia   . Hypothyroidism   . GERD (gastroesophageal reflux disease)   . Complete  heart block ([place]) 05/14/2014    St. Jude BiV PM (serial number [id number]) pacemaker  . CKD stage 3 due to type 2 diabetes mellitus ([place])   . Osteopenia 2016  . Hypertension   . Collapsed lung   . Presence of permanent cardiac pacemaker   . Type II diabetes mellitus ([place])   . Myocardial infarction ([place]) 2002  . History of blood transfusion 2002    "after my heart attack"  . History of stomach ulcers 1960s  . Arthritis     "left hand" (08/21/2015)  . History of tobacco use     Quit in 1990  . PONV (postoperative nausea and vomiting)   . Peripheral vascular disease ([place])     left hand    Family History  Problem Relation Age of Onset  . Cancer Mother     brain and lung  . Diabetes Father 64  . Coronary artery disease Father   . Colon cancer Neg Hx   . Diabetes Sister   . Diabetes Brother   . Diabetes Son     SOCIAL HISTORY: Social History  Substance Use Topics  . Smoking status: Former Smoker -- 1.00 packs/day    Types: Cigarettes    Start date: 11/04/1954    Quit date: 05/06/1988  . Smokeless tobacco: Never Used  . Alcohol Use: No    Allergies  Allergen Reactions  . Codeine Other (See Comments)    hallucinations  . Ciprofloxacin Diarrhea  . Nsaids Other (See  Comments)    GI upset    Current Outpatient Prescriptions  Medication Sig Dispense Refill  . ACCU-CHEK SOFTCLIX LANCETS lancets Check BG 4Xdaily DX E11.65 300 each 1  . aspirin 81 MG tablet Take 81 mg by mouth daily.      . Blood Glucose Monitoring Suppl (ACCU-CHEK AVIVA PLUS) W/DEVICE KIT Check BG 4XDaily DX E11.65 1 kit 0  . Chlorphen-Pseudoephed-APAP (COLD MEDICINE PLUS) 2-30-325 MG CAPS Take 1-2 capsules by mouth daily as needed (for cold and cough). Reported on 09/20/2015    . Cholecalciferol (VITAMIN D PO) Take 1,000 Units by mouth daily.     Marland Kitchen COREG CR 10 MG 24 hr capsule TAKE 1 CAPSULE (10 MG TOTAL) BY MOUTH DAILY. 30 capsule 2  . gabapentin (NEURONTIN) 600 MG tablet TAKE 1 TABLET THREE TIMES A DAY 90  tablet 1  . glucose blood (ACCU-CHEK AVIVA PLUS) test strip Check BG 4xdaily DX Ell.65 300 each 1  . insulin aspart (NOVOLOG) 100 UNIT/ML injection Inject 0-20 units as directed per sliding scale (Patient taking differently: Inject 0-20 Units into the skin 3 (three) times daily with meals. Inject 0-20 units as directed per sliding scale) 30 mL 6  . insulin glargine (LANTUS) 100 UNIT/ML injection Inject 32 Units into the skin at bedtime.    . Insulin Syringe-Needle U-100 (BD INSULIN SYRINGE ULTRAFINE) 31G X 15/64" 0.5 ML MISC Use once daily 100 each 1  . levothyroxine (SYNTHROID, LEVOTHROID) 125 MCG tablet TAKE 1 TABLET (125 MCG TOTAL) BY MOUTH DAILY. 30 tablet 2  . nortriptyline (PAMELOR) 25 MG capsule TAKE 1 CAPSULE (25 MG TOTAL) BY MOUTH AT BEDTIME. 30 capsule 1  . omeprazole (PRILOSEC) 20 MG capsule TAKE 1 CAPSULE BY MOUTH DAILY 30 capsule 4  . simvastatin (ZOCOR) 40 MG tablet TAKE 1 TABLET (40 MG TOTAL) BY MOUTH AT BEDTIME. 90 tablet 3  . tetrahydrozoline (EYE DROPS) 0.05 % ophthalmic solution Apply 1-2 drops to eye daily.    . traMADol (ULTRAM) 50 MG tablet Take 1 tablet (50 mg total) by mouth every 6 (six) hours as needed for moderate pain. (Patient not taking: Reported on 09/20/2015) 30 tablet 0  . traMADol (ULTRAM) 50 MG tablet Take 1 tablet (50 mg total) by mouth every 6 (six) hours as needed. (Patient not taking: Reported on 09/20/2015) 8 tablet 0   No current facility-administered medications for this visit.   Facility-Administered Medications Ordered in Other Visits  Medication Dose Route Frequency Provider Last Rate Last Dose  . 0.9 %  sodium chloride infusion   Intravenous Continuous Angelia Mould, MD        REVIEW OF SYSTEMS:  [X] denotes positive finding, [ ] denotes negative finding Cardiac  Comments:  Chest pain or chest pressure:    Shortness of breath upon exertion:    Short of breath when lying flat:    Irregular heart rhythm:        Vascular    Pain in calf,  thigh, or hip brought on by ambulation: x   Pain in feet at night that wakes you up from your sleep:     Blood clot in your veins:    Leg swelling:         Pulmonary    Oxygen at home:    Productive cough:     Wheezing:         Neurologic    Sudden weakness in arms or legs:     Sudden numbness in arms or legs:  Sudden onset of difficulty speaking or slurred speech:    Temporary loss of vision in one eye:     Problems with dizziness:         Gastrointestinal    Blood in stool:     Vomited blood:         Genitourinary    Burning when urinating:     Blood in urine:        Psychiatric    Major depression:         Hematologic    Bleeding problems:    Problems with blood clotting too easily:        Skin    Rashes or ulcers:        Constitutional    Fever or chills:      PHYSICAL EXAM: Filed Vitals:   09/27/15 1412  BP: 124/68  Pulse: 77  Temp: 97.8 F (36.6 C)  TempSrc: Oral  Height: 5' 8" (1.727 m)  Weight: 161 lb (73.029 kg)  SpO2: 96%    GENERAL: The patient is a well-nourished male, in no acute distress. The vital signs are documented above. CARDIAC: There is a regular rate and rhythm.  VASCULAR: Bilateral carotid bruits. Biphasic left brachial Doppler signal. Monophasic left radial, ulnar and palmar arch signals. Left upper extremity swelling. Seroma of left proximal arm around incision. Incision is healed. PULMONARY: Nonlabored respiratory effort. MUSCULOSKELETAL: Amputation left index finger. Left lower extremity amputation. NEUROLOGIC: No focal weakness or paresthesias are detected. PSYCHIATRIC: The patient has a normal affect.  DATA:  Carotid duplex 09/27/2015  40-59% right internal carotid artery stenosis. Less than 40% stenosis left internal carotid artery. Vertebral arteries with antegrade flow bilaterally.  MEDICAL ISSUES: Status post left brachial endarterectomy with bovine periardial patch angioplasty  The patient has good Doppler flow  to his left arm. He continues to have significant left upper extremity swelling. The seroma of his left upper arm was very uncomfortable. Therefore, Dr.Dickson performed a needle aspiration of the seroma and withdrew 75 mL of brown serous fluid. A compressive dressing and Ace wrap were applied to his left proximal arm. The patient reported significant relief after the procedure. Advised the patient to continue elevating his arm. Discussed that the seroma may likely return. He will follow-up in 6 weeks for reevaluation.  Carotid artery stenosis  The patient has been asymptomatic without any signs or symptoms of TIA or stroke. There were no significant changes on his carotid duplex today compared to his previous exam 6 months ago. He is on maximal medical management with aspirin and a statin. He is not a smoker. He will follow-up in 1 year with repeat carotid duplex.   Virgina Jock, PA-C [facility] [place]

## 2015-10-11 ENCOUNTER — Encounter: Payer: PPO | Admitting: *Deleted

## 2015-10-13 ENCOUNTER — Telehealth: Payer: Self-pay

## 2015-10-13 NOTE — Telephone Encounter (Signed)
Attempted to return phone call to pt. After receiving voice message re: c/o continued swelling (L) arm.  Unable to get call through on the home phone, and the mailbox full on cell phone.  Will try to contact pt. Later.

## 2015-10-13 NOTE — Telephone Encounter (Addendum)
Phone call returned to pt.  Reported he feels like the swelling in the left arm has increased to the size it was, prior to office exam on 5/24.  Stated "it is not painful, but it stays sore."  Stated that "the skin is very stretched."  Denied fever/ chills.  Reported the left arm incision is healed.  Denied any drainage from the arm.  Encouraged to elevate above level of heart at intervals during day.  Reported he has been elevating at night.  Appt. Given for 10/18/15 @ 8:45 AM with Dr. [name].  Advised pt. To be NPO prior to appt. Except for AM heart/ blood pressure meds with sips of water, to be prepared if he will need any procedure done that day.  Verb. Understanding.  Did advise pt. To go to the ER over the weekend, if his symptoms worsen.

## 2015-10-16 ENCOUNTER — Encounter: Payer: Self-pay | Admitting: Vascular Surgery

## 2015-10-17 ENCOUNTER — Other Ambulatory Visit: Payer: Self-pay | Admitting: Family Medicine

## 2015-10-18 ENCOUNTER — Encounter: Payer: Self-pay | Admitting: Vascular Surgery

## 2015-10-18 ENCOUNTER — Ambulatory Visit (INDEPENDENT_AMBULATORY_CARE_PROVIDER_SITE_OTHER): Payer: Self-pay | Admitting: Vascular Surgery

## 2015-10-18 VITALS — BP 138/74 | HR 81 | Temp 97.0°F | Ht 68.0 in | Wt 160.9 lb

## 2015-10-18 DIAGNOSIS — Z48812 Encounter for surgical aftercare following surgery on the circulatory system: Secondary | ICD-10-CM

## 2015-10-18 NOTE — Progress Notes (Signed)
Patient name: Christopher Padilla MRN: [id number] DOB: Jun 08, 1940 Sex: male  REASON FOR VISIT: Follow up after left brachial artery endarterectomy  HPI: Christopher Padilla is a 75 y.o. male Who had presented with progressive ischemia of the left upper extremity. He had a tight brachial artery stenosis and on 09/07/2015 underwent a left brachial artery endarterectomy with bovine pericardial patch angioplasty. When I saw him last on 09/27/2015, I aspirated a seroma in the left upper arm and retrieved about 75 cc of fluid. He comes in for a follow up visit.  His only complaint is some swelling in the left arm. 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  Current Outpatient Prescriptions  Medication Sig Dispense Refill  . ACCU-CHEK SOFTCLIX LANCETS lancets Check BG 4Xdaily DX E11.65 300 each 1  . aspirin 81 MG tablet Take 81 mg by mouth daily.      . Blood Glucose Monitoring Suppl (ACCU-CHEK AVIVA PLUS) W/DEVICE KIT Check BG 4XDaily DX E11.65 1 kit 0  . Cholecalciferol (VITAMIN D PO) Take 1,000 Units by mouth daily.     Marland Kitchen COREG CR 10 MG 24 hr capsule TAKE 1 CAPSULE (10 MG TOTAL) BY MOUTH DAILY. 30 capsule 2  . gabapentin (NEURONTIN) 600 MG tablet TAKE 1 TABLET THREE TIMES A DAY 90 tablet 1  . glucose blood (ACCU-CHEK AVIVA PLUS) test strip Check BG 4xdaily DX Ell.65 300 each 1  . insulin aspart (NOVOLOG) 100 UNIT/ML injection Inject 0-20 units as directed per sliding scale (Patient taking differently: Inject 0-20 Units into the skin 3 (three) times daily with meals. Inject 0-20 units as directed per sliding scale) 30 mL 6  . insulin glargine (LANTUS) 100 UNIT/ML injection Inject 32 Units into the skin at bedtime.    . Insulin Syringe-Needle U-100 (BD INSULIN SYRINGE ULTRAFINE) 31G X 15/64" 0.5 ML MISC Use once daily 100 each 1  . levothyroxine (SYNTHROID, LEVOTHROID) 125 MCG tablet TAKE 1 TABLET (125 MCG TOTAL) BY MOUTH DAILY. 30 tablet 2  . nortriptyline (PAMELOR) 25 MG capsule  TAKE 1 CAPSULE (25 MG TOTAL) BY MOUTH AT BEDTIME. 30 capsule 1  . omeprazole (PRILOSEC) 20 MG capsule TAKE 1 CAPSULE BY MOUTH DAILY 30 capsule 1  . simvastatin (ZOCOR) 40 MG tablet TAKE 1 TABLET (40 MG TOTAL) BY MOUTH AT BEDTIME. 90 tablet 3  . tetrahydrozoline (EYE DROPS) 0.05 % ophthalmic solution Apply 1-2 drops to eye daily.    . Chlorphen-Pseudoephed-APAP (COLD MEDICINE PLUS) 2-30-325 MG CAPS Take 1-2 capsules by mouth daily as needed (for cold and cough). Reported on 10/18/2015    . traMADol (ULTRAM) 50 MG tablet Take 1 tablet (50 mg total) by mouth every 6 (six) hours as needed for moderate pain. (Patient not taking: Reported on 09/20/2015) 30 tablet 0  . traMADol (ULTRAM) 50 MG tablet Take 1 tablet (50 mg total) by mouth every 6 (six) hours as needed. (Patient not taking: Reported on 09/20/2015) 8 tablet 0   No current facility-administered medications for this visit.   Facility-Administered Medications Ordered in Other Visits  Medication Dose Route Frequency Provider Last Rate Last Dose  . 0.9 %  sodium chloride infusion   Intravenous Continuous Angelia Mould, MD        REVIEW OF SYSTEMS:  [X]  denotes positive finding, [ ]  denotes negative finding Cardiac  Comments:  Chest pain or chest pressure:    Shortness of breath upon exertion:    Short of breath when lying flat:  Irregular heart rhythm:    Constitutional    Fever or chills:      PHYSICAL EXAM: Filed Vitals:   10/18/15 0836  BP: 138/74  Pulse: 81  Temp: 97 F (36.1 C)  TempSrc: Oral  Height: 5' 8"  (1.727 m)  Weight: 160 lb 14.4 oz (72.984 kg)  SpO2: 98%    GENERAL: The patient is a well-nourished male, in no acute distress. The vital signs are documented above. CARDIOVASCULAR: There is a regular rate and rhythm. PULMONARY: There is good air exchange bilaterally without wheezing or rales. He has a brisk radial signal with the Doppler on the left. The lymphocele has not returned. He has mild swelling of  the left upper extremity.  Previous venous duplex scan showed no evidence of DVT in the left upper extremity.   MEDICAL ISSUES:  STATUS POST LEFT BRACHIAL ENDARTERECTOMY AND PATCH PLASTY: The patient is doing well status post left brachial endarterectomy. Encouraged him to elevate his arm for the swelling. Fortunately the lymphocele did not return. I have ordered a follow up arterial Doppler study in 6 months and I'll see him back at that time. He knows to call sooner if he has problems.  Deitra Mayo [facility] [phone]

## 2015-10-25 ENCOUNTER — Other Ambulatory Visit: Payer: Self-pay | Admitting: Family Medicine

## 2015-10-30 ENCOUNTER — Telehealth: Payer: Self-pay | Admitting: Family Medicine

## 2015-10-30 ENCOUNTER — Encounter: Payer: Self-pay | Admitting: Vascular Surgery

## 2015-10-30 NOTE — Telephone Encounter (Signed)
Pt wanted to be seen for fatigue appt scheduled Pt notified

## 2015-10-31 ENCOUNTER — Ambulatory Visit (INDEPENDENT_AMBULATORY_CARE_PROVIDER_SITE_OTHER): Payer: PPO

## 2015-10-31 ENCOUNTER — Ambulatory Visit (INDEPENDENT_AMBULATORY_CARE_PROVIDER_SITE_OTHER): Payer: PPO | Admitting: Family Medicine

## 2015-10-31 ENCOUNTER — Other Ambulatory Visit: Payer: Self-pay | Admitting: Family Medicine

## 2015-10-31 ENCOUNTER — Encounter: Payer: Self-pay | Admitting: Family Medicine

## 2015-10-31 VITALS — BP 134/61 | HR 73 | Temp 97.0°F | Ht 68.0 in | Wt 164.4 lb

## 2015-10-31 DIAGNOSIS — R0602 Shortness of breath: Secondary | ICD-10-CM | POA: Diagnosis not present

## 2015-10-31 DIAGNOSIS — I5042 Chronic combined systolic (congestive) and diastolic (congestive) heart failure: Secondary | ICD-10-CM

## 2015-10-31 DIAGNOSIS — S3091XA Unspecified superficial injury of lower back and pelvis, initial encounter: Secondary | ICD-10-CM | POA: Diagnosis not present

## 2015-10-31 DIAGNOSIS — I2510 Atherosclerotic heart disease of native coronary artery without angina pectoris: Secondary | ICD-10-CM

## 2015-10-31 DIAGNOSIS — W57XXXA Bitten or stung by nonvenomous insect and other nonvenomous arthropods, initial encounter: Secondary | ICD-10-CM | POA: Diagnosis not present

## 2015-10-31 MED ORDER — FUROSEMIDE 40 MG PO TABS: 40.0000 mg | ORAL_TABLET | Freq: Every day | ORAL | Status: DC

## 2015-10-31 NOTE — Patient Instructions (Signed)
Great to see you!  To be safe lets get you over to see your heart doctor in the next few days.   We will cal with lab results within a few days  Please seek immediate medical care for any chest pain

## 2015-10-31 NOTE — Progress Notes (Signed)
   HPI  Patient presents today here with fatigue and dyspnea.  Patient explains that over the last 2-3 weeks she's had slowly worsening fatigue and exertional dyspnea. He states that he can get up and do almost anything including walking a few steps and get short of breath. He denies any chest tightness or pain.  He had an MI previously which manifested by having exertional dyspnea and chest tightness.  He denies any unusual sweating, palpitations  He has a recent history of endarterectomy of the left brachial artery. His wound is healed well however he has some persistent left upper extremity swelling.  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 Removed with forceps, the head stayed in. Using small forceps and the tip of an 11 blade the rest of the arthropod was removed.   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 His EKG shows normal pacing by his pacemaker. He does not show any signs of acute MI, however I'm concerned about worsening CAD. He has very close  cardiology follow-up in 3 days, I have called the on-call cardiology provider who does not recommend acute evaluation, he recommends follow-up with electrophysiology as already scheduled.  He does have blunting of the left-sided costophrenic angle, weight is up 3-4 lbs  Will add lasix for 3 days until follow up and see if he has any improvement in symps.  Caution with diuretics given renal function. Given 10 days of pills  # Tick removal [place] tick removed in clinic today This take is not generally associated with RMSF or Lyme disease. I do not think that his current fatigue and dyspnea are associated with the tick bite    Orders Placed This Encounter  Procedures  . DG Chest 2 View    Standing Status: Future     Number of Occurrences: 1     Standing Expiration Date: 12/30/2016    Order Specific Question:  Reason for Exam (SYMPTOM  OR DIAGNOSIS REQUIRED)    Answer:  dyspnea    Order Specific Question:  Preferred imaging location?    Answer:  Internal  . CMP14+EGFR  . CBC with Differential  . TSH  . Brain natriuretic peptide  . EKG 12-Lead    Laroy Apple, MD [facility] 10/31/2015, 11:43 AM

## 2015-11-01 LAB — CMP14+EGFR
ALBUMIN: 3.5 g/dL (ref 3.5–4.8)
ALT: 22 IU/L (ref 0–44)
AST: 21 IU/L (ref 0–40)
Albumin/Globulin Ratio: 1 — ABNORMAL LOW (ref 1.2–2.2)
Alkaline Phosphatase: 219 IU/L — ABNORMAL HIGH (ref 39–117)
BUN / CREAT RATIO: 16 (ref 10–24)
BUN: 23 mg/dL (ref 8–27)
Bilirubin Total: 0.7 mg/dL (ref 0.0–1.2)
CO2: 25 mmol/L (ref 18–29)
CREATININE: 1.45 mg/dL — AB (ref 0.76–1.27)
Calcium: 9 mg/dL (ref 8.6–10.2)
Chloride: 96 mmol/L (ref 96–106)
GFR calc Af Amer: 54 mL/min/{1.73_m2} — ABNORMAL LOW (ref >59)
GFR calc non Af Amer: 46 mL/min/{1.73_m2} — ABNORMAL LOW (ref >59)
GLUCOSE: 240 mg/dL — AB (ref 65–99)
Globulin, Total: 3.4 g/dL (ref 1.5–4.5)
POTASSIUM: 4.8 mmol/L (ref 3.5–5.2)
SODIUM: 135 mmol/L (ref 134–144)
TOTAL PROTEIN: 6.9 g/dL (ref 6.0–8.5)

## 2015-11-01 LAB — CBC WITH DIFFERENTIAL/PLATELET
Basophils Absolute: 0 10*3/uL (ref 0.0–0.2)
Basos: 1 %
EOS (ABSOLUTE): 0.2 10*3/uL (ref 0.0–0.4)
EOS: 3 %
HEMATOCRIT: 40.8 % (ref 37.5–51.0)
Hemoglobin: 12.9 g/dL (ref 12.6–17.7)
IMMATURE GRANULOCYTES: 0 %
Immature Grans (Abs): 0 10*3/uL (ref 0.0–0.1)
LYMPHS ABS: 1.1 10*3/uL (ref 0.7–3.1)
Lymphs: 18 %
MCH: 29.5 pg (ref 26.6–33.0)
MCHC: 31.6 g/dL (ref 31.5–35.7)
MCV: 93 fL (ref 79–97)
MONOS ABS: 0.7 10*3/uL (ref 0.1–0.9)
Monocytes: 12 %
NEUTROS PCT: 66 %
Neutrophils Absolute: 4 10*3/uL (ref 1.4–7.0)
PLATELETS: 169 10*3/uL (ref 150–379)
RBC: 4.38 x10E6/uL (ref 4.14–5.80)
RDW: 14.2 % (ref 12.3–15.4)
WBC: 6 10*3/uL (ref 3.4–10.8)

## 2015-11-01 LAB — TSH: TSH: 1.26 u[IU]/mL (ref 0.450–4.500)

## 2015-11-02 NOTE — Progress Notes (Signed)
Electrophysiology Office Note Date: 11/03/2015  ID:  Christopher Padilla, DOB 1941/03/14, MRN [id number]  PCP: Redge Gainer, MD Electrophysiologist: Lovena Le  CC: Pacemaker follow-up and evaluation of shortness of breath  Christopher Padilla is a 75 y.o. male seen today for Dr Lovena Le after a prolonged absence from the EP clinic.  He presents today for routine electrophysiology followup and evaluation of shortness of breath and fatigue.  He was seen by his PCP recently for increased shortness of breath and Lasix was increased for 3 days with no significant change in symptoms, however, he has only taken the increased dose for 1 day. His biggest concern is fatigue and exercise intolerance.  He denies chest pain, palpitations, PND, orthopnea, nausea, vomiting, dizziness, syncope, edema, weight gain, or early satiety.  Echo 04/2014 demonstrated EF 88-89%, grade 1 diastolic dysfunction, PA pressure 40 Cath 05/2014 demonstrated 3/4 patent grafts; occluded SVG to diagnonal  Device History: MDT CRTP implanted 2016 for complete heart block, CHF  Past Medical History  Diagnosis Date  . CAD (coronary artery disease)     S/P CABG, Feb 2003, LIMA to the LAD, left free radial artery to an obtuse marginal, spahenous vein graft to diagonal, saphenous vein graft to RCA with endarterectomy  . Cerebrovascular disease     MRA in 2005 with 75% stenosis in distal right vertebral artery and 75% stenosis greater in distal cervical internal carotid artery on teh right, severe bilateral disease and MRA of the extracranial circulation, high grade stenosis of the distal right internal carotid artery at the junction of the cervical internal carotid artery of the skull base  . Hyperlipidemia   . Hypothyroidism   . GERD (gastroesophageal reflux disease)   . Complete heart block ([place]) 05/14/2014    St. Jude BiV PM (serial number [id number]) pacemaker  . CKD stage 3 due to type 2 diabetes mellitus ([place])   . Osteopenia 2016  .  Hypertension   . Collapsed lung   . Presence of permanent cardiac pacemaker   . Type II diabetes mellitus ([place])   . Myocardial infarction ([place]) 2002  . History of blood transfusion 2002    "after my heart attack"  . History of stomach ulcers 1960s  . Arthritis     "left hand" (08/21/2015)  . History of tobacco use     Quit in 1990  . PONV (postoperative nausea and vomiting)   . Peripheral vascular disease ([place])     left hand   Past Surgical History  Procedure Laterality Date  . Leg amputation below knee Bilateral 2001-2004  . Arterial bypass surgry      Left; left femoral to posterior tibial bypass gracting, left femoral artery and deep femoral artery endarterectomy with vein patch angioplasty of the common femoral arter and deep femoral artery 06/2005, right fremoral popliteal bypass, right iliac artery restent, bilateral below knee amputations  . Thoracotomy      With drainage of a hemathroax and decortication of fibrothorax  . Carpal tunnel release Right   . Amputation  10/17/2011    Procedure: AMPUTATION DIGIT;  Surgeon: Newt Minion, MD;  Location: [facility];  Service: Orthopedics;  Laterality: Left;  Amputation Left Index Finger at PIP Joint  . Left heart catheterization with coronary angiogram N/A 05/10/2014    Procedure: LEFT HEART CATHETERIZATION WITH CORONARY ANGIOGRAM;  Surgeon: Burnell Blanks, MD;  [facility] C[facility] LAB;  Service: Cardiovascular;  Laterality: N/A;  . Temporary pacemaker insertion N/A 05/14/2014  Procedure: TEMPORARY PACEMAKER INSERTION;  Surgeon: Blane Ohara, MD;  Location[facility] C[facility] LAB;  Service: Cardiovascular;  Laterality: N/A;  . Bi-ventricular pacemaker insertion N/A 05/16/2014    Procedure: BI-VENTRICULAR PACEMAKER INSERTION (CRT-P);  Surgeon: Evans Lance, MD; St. Jude BiV PM (serial number [id number]) pacemaker  . Insert / replace / remove pacemaker    . Cataract extraction w/ intraocular lens  implant, bilateral Bilateral   . Cardiac  catheterization    . Coronary artery bypass graft  2002    CABG X "4", By Ivin Poot, MD  . Peripheral vascular catheterization N/A 08/21/2015    Procedure: Upper Extremity Angiography;  Surgeon: Angelia Mould, MD;  Location: [facility] CV LAB;  Service: Cardiovascular;  Laterality: N/A;  . Peripheral vascular catheterization N/A 08/21/2015    Procedure: Aortic Arch Angiography;  Surgeon: Angelia Mould, MD;  Location: [facility] CV LAB;  Service: Cardiovascular;  Laterality: N/A;  . Endarterectomy Left 09/07/2015    Procedure: ENDARTERECTOMY LEFT  BRACHIAL ARTERY ;  Surgeon: Angelia Mould, MD;  Location: [facility];  Service: Vascular;  Laterality: Left;  . Patch angioplasty Left 09/07/2015    Procedure: LEFT BRACHIAL ARTERY PATCH ANGIOPLASTY USING XENOSURE BIOLOGIC PATCH;  Surgeon: Angelia Mould, MD;  Location: [facility];  Service: Vascular;  Laterality: Left;    Current Outpatient Prescriptions  Medication Sig Dispense Refill  . ACCU-CHEK SOFTCLIX LANCETS lancets Check BG 4Xdaily DX E11.65 300 each 1  . aspirin 81 MG tablet Take 81 mg by mouth daily.      . Blood Glucose Monitoring Suppl (ACCU-CHEK AVIVA PLUS) W/DEVICE KIT Check BG 4XDaily DX E11.65 1 kit 0  . Chlorphen-Pseudoephed-APAP (COLD MEDICINE PLUS) 2-30-325 MG CAPS Take 1-2 capsules by mouth daily as needed (for cold and cough). Reported on 10/18/2015    . Cholecalciferol (VITAMIN D PO) Take 1,000 Units by mouth daily.     . furosemide (LASIX) 40 MG tablet Take 1 tablet (40 mg total) by mouth daily. 10 tablet 0  . gabapentin (NEURONTIN) 600 MG tablet Take 600 mg by mouth 3 (three) times daily.    Marland Kitchen glucose blood (ACCU-CHEK AVIVA PLUS) test strip Check BG 4xdaily DX Ell.65 300 each 1  . insulin aspart (NOVOLOG) 100 UNIT/ML injection Inject 0-20 units as directed per sliding scale (Patient taking differently: Inject 0-20 Units into the skin 3 (three) times daily with meals. Inject 0-20 units as directed per  sliding scale) 30 mL 6  . insulin glargine (LANTUS) 100 UNIT/ML injection Inject 32 Units into the skin at bedtime.    . Insulin Syringe-Needle U-100 (BD INSULIN SYRINGE ULTRAFINE) 31G X 15/64" 0.5 ML MISC Use once daily 100 each 1  . levothyroxine (SYNTHROID, LEVOTHROID) 125 MCG tablet TAKE 1 TABLET (125 MCG TOTAL) BY MOUTH DAILY. 30 tablet 2  . nortriptyline (PAMELOR) 25 MG capsule TAKE 1 CAPSULE (25 MG TOTAL) BY MOUTH AT BEDTIME. 30 capsule 1  . omeprazole (PRILOSEC) 20 MG capsule Take 20 mg by mouth every other day.    . simvastatin (ZOCOR) 40 MG tablet TAKE 1 TABLET (40 MG TOTAL) BY MOUTH AT BEDTIME. 90 tablet 3  . tetrahydrozoline (EYE DROPS) 0.05 % ophthalmic solution Apply 1-2 drops to eye daily.    . carvedilol (COREG) 25 MG tablet Take 1 tablet (25 mg total) by mouth 2 (two) times daily with a meal. 60 tablet 3   No current facility-administered medications for this visit.   Facility-Administered Medications Ordered in Other  Visits  Medication Dose Route Frequency Provider Last Rate Last Dose  . 0.9 %  sodium chloride infusion   Intravenous Continuous Angelia Mould, MD        Allergies:   Codeine; Ciprofloxacin; and Nsaids   Social History: Social History   Social History  . Marital Status: Married    Spouse Name: N/A  . Number of Children: N/A  . Years of Education: N/A   Occupational History  . Retired    Social History Main Topics  . Smoking status: Former Smoker -- 1.00 packs/day    Types: Cigarettes    Start date: 11/04/1954    Quit date: 05/06/1988  . Smokeless tobacco: Never Used  . Alcohol Use: No  . Drug Use: No  . Sexual Activity: Not Currently   Other Topics Concern  . Not on file   Social History Narrative   Married with 2 children    Family History: Family History  Problem Relation Age of Onset  . Cancer Mother     brain and lung  . Diabetes Father 79  . Coronary artery disease Father   . Colon cancer Neg Hx   . Diabetes Sister     . Diabetes Brother   . Diabetes Son      Review of Systems: All other systems reviewed and are otherwise negative except as noted above.   Physical Exam: VS:  BP 126/60 mmHg  Pulse 71  Ht 5' 8"  (1.727 m)  Wt 165 lb (74.844 kg)  BMI 25.09 kg/m2  SpO2 96% , BMI Body mass index is 25.09 kg/(m^2).  GEN- The patient is elderly appearing, alert and oriented x 3 today.   HEENT: normocephalic, atraumatic; sclera clear, conjunctiva pink; hearing intact; oropharynx clear; neck supple  Lungs- Clear to ausculation bilaterally, normal work of breathing.  No wheezes, rales, rhonchi Heart- Regular rate and rhythm (paced) GI- soft, non-tender, non-distended, bowel sounds present  Extremities- bilateral BKA with prosthesis, left hand first finger amputation, LUE edema MS- no significant deformity or atrophy Skin- warm and dry, no rash or lesion; PPM pocket well healed Psych- euthymic mood, full affect Neuro- strength and sensation are intact  PPM Interrogation- reviewed in detail today,  See PACEART report  EKG:  EKG is not ordered today. EKG reviewed from PCP visit 2 days ago   Recent Labs: 09/08/2015: Hemoglobin 12.2* 10/31/2015: ALT 22; BUN 23; Creatinine, Ser 1.45*; Platelets 169; Potassium 4.8; Sodium 135; TSH 1.260   Wt Readings from Last 3 Encounters:  11/03/15 165 lb (74.844 kg)  10/31/15 164 lb 6.4 oz (74.571 kg)  10/18/15 160 lb 14.4 oz (72.984 kg)     Other studies Reviewed: Additional studies/ records that were reviewed today include: Dr Tanna Furry office notes, PCP notes, last cath   Assessment and Plan:  1.  Complete heart block Normal PPM function See Pace Art report No changes today Pt has FFRW seen on device interrogation today causing mode switch to occur with resultant loss of AV synchrony. Device was checked in hospital during recent vascular arm surgery and it looks like PVAB was shortened at that time which has allowed false mode switch. I am not convinced his  symptoms of fatigue are all coming from the loss of AV synchrony, but I have reprogrammed device today to extend PVAB and also increased mode switch detection. Recent CXR shows atrial lead in good position  2.  Chronic systolic heart failure Slightly volume overloaded on exam Continue current therapy Agree with  short term increased Lasix to see if improves symptoms  3.  Shortness of breath/fatigue Likely multi-factorial He has severe vascular disease and we cannot completely rule out progression of CAD although he tells me today he had chest tightness prior to CABG which he has not had recently Will update echo  We have discussed repeat cath today, but I think we should see if programming changes help first and see results of echo I have asked patient to go to the hospital if he has recurrent chest pain Follow up with Dr Lovena Le in a couple of weeks to discuss further   Current medicines are reviewed at length with the patient today.   The patient does not have concerns regarding his medicines.  The following changes were made today:  none  Labs/ tests ordered today include:  Orders Placed This Encounter  Procedures  . Implantable device check  . ECHO COMPLETE     Disposition:   Follow up with Dr Lovena Le after echo for further evaluation    Signed, Chanetta Marshall, NP 11/03/2015 2:09 PM  [facility] [address] [place] [address] [phone] (office) [phone] (fax)

## 2015-11-03 ENCOUNTER — Encounter: Payer: Self-pay | Admitting: Nurse Practitioner

## 2015-11-03 ENCOUNTER — Ambulatory Visit (INDEPENDENT_AMBULATORY_CARE_PROVIDER_SITE_OTHER): Payer: PPO | Admitting: Nurse Practitioner

## 2015-11-03 ENCOUNTER — Encounter: Payer: Self-pay | Admitting: Internal Medicine

## 2015-11-03 VITALS — BP 126/60 | HR 71 | Ht 68.0 in | Wt 165.0 lb

## 2015-11-03 DIAGNOSIS — I442 Atrioventricular block, complete: Secondary | ICD-10-CM

## 2015-11-03 DIAGNOSIS — R0602 Shortness of breath: Secondary | ICD-10-CM

## 2015-11-03 DIAGNOSIS — R5383 Other fatigue: Secondary | ICD-10-CM

## 2015-11-03 DIAGNOSIS — I5022 Chronic systolic (congestive) heart failure: Secondary | ICD-10-CM

## 2015-11-03 LAB — CUP PACEART INCLINIC DEVICE CHECK
Implantable Lead Implant Date: 20160111
Implantable Lead Implant Date: 20160111
Implantable Lead Model: 4196
MDC IDC LEAD IMPLANT DT: 20160111
MDC IDC LEAD LOCATION: 753857
MDC IDC LEAD LOCATION: 753859
MDC IDC LEAD LOCATION: 753860
MDC IDC PG SERIAL: 7714017
MDC IDC SESS DTM: 20170630095652

## 2015-11-03 MED ORDER — CARVEDILOL 25 MG PO TABS: 25.0000 mg | ORAL_TABLET | Freq: Two times a day (BID) | ORAL | Status: DC

## 2015-11-03 NOTE — Patient Instructions (Addendum)
Medication Instructions:   START TAKING COREG 25 MG TWICE A DAY    If you need a refill on your cardiac medications before your next appointment, please call your pharmacy.  Labwork:  NONE ORDER TODAY    Testing/Procedures:  Your physician has requested that you have an echocardiogram. Echocardiography is a painless test that uses sound waves to create images of your heart. It provides your doctor with information about the size and shape of your heart and how well your heart's chambers and valves are working. This procedure takes approximately one hour. There are no restrictions for this procedure.    Follow-Up: IN 4 TO 5 WEEKS WITH DR Ladona Ridgel    Any Other Special Instructions Will Be Listed Below (If Applicable).

## 2015-11-08 ENCOUNTER — Encounter (HOSPITAL_COMMUNITY): Payer: PPO

## 2015-11-08 ENCOUNTER — Ambulatory Visit: Payer: PPO | Admitting: Vascular Surgery

## 2015-11-17 DIAGNOSIS — Z89512 Acquired absence of left leg below knee: Secondary | ICD-10-CM | POA: Diagnosis not present

## 2015-11-21 ENCOUNTER — Other Ambulatory Visit: Payer: Self-pay

## 2015-11-21 ENCOUNTER — Ambulatory Visit (HOSPITAL_COMMUNITY): Payer: PPO | Attending: Internal Medicine

## 2015-11-21 DIAGNOSIS — I1310 Hypertensive heart and chronic kidney disease without heart failure, with stage 1 through stage 4 chronic kidney disease, or unspecified chronic kidney disease: Secondary | ICD-10-CM | POA: Insufficient documentation

## 2015-11-21 DIAGNOSIS — R0600 Dyspnea, unspecified: Secondary | ICD-10-CM | POA: Diagnosis not present

## 2015-11-21 DIAGNOSIS — I252 Old myocardial infarction: Secondary | ICD-10-CM | POA: Insufficient documentation

## 2015-11-21 DIAGNOSIS — R29898 Other symptoms and signs involving the musculoskeletal system: Secondary | ICD-10-CM | POA: Insufficient documentation

## 2015-11-21 DIAGNOSIS — E1122 Type 2 diabetes mellitus with diabetic chronic kidney disease: Secondary | ICD-10-CM | POA: Insufficient documentation

## 2015-11-21 DIAGNOSIS — I2510 Atherosclerotic heart disease of native coronary artery without angina pectoris: Secondary | ICD-10-CM | POA: Insufficient documentation

## 2015-11-21 DIAGNOSIS — I340 Nonrheumatic mitral (valve) insufficiency: Secondary | ICD-10-CM | POA: Insufficient documentation

## 2015-11-21 DIAGNOSIS — Z8249 Family history of ischemic heart disease and other diseases of the circulatory system: Secondary | ICD-10-CM | POA: Insufficient documentation

## 2015-11-21 DIAGNOSIS — I071 Rheumatic tricuspid insufficiency: Secondary | ICD-10-CM | POA: Insufficient documentation

## 2015-11-21 DIAGNOSIS — N189 Chronic kidney disease, unspecified: Secondary | ICD-10-CM | POA: Insufficient documentation

## 2015-11-21 DIAGNOSIS — Z951 Presence of aortocoronary bypass graft: Secondary | ICD-10-CM | POA: Diagnosis not present

## 2015-11-21 DIAGNOSIS — E785 Hyperlipidemia, unspecified: Secondary | ICD-10-CM | POA: Insufficient documentation

## 2015-11-21 DIAGNOSIS — R0602 Shortness of breath: Secondary | ICD-10-CM | POA: Diagnosis not present

## 2015-11-21 DIAGNOSIS — I351 Nonrheumatic aortic (valve) insufficiency: Secondary | ICD-10-CM | POA: Insufficient documentation

## 2015-11-21 DIAGNOSIS — Z87891 Personal history of nicotine dependence: Secondary | ICD-10-CM | POA: Diagnosis not present

## 2015-11-30 ENCOUNTER — Encounter: Payer: Self-pay | Admitting: Internal Medicine

## 2015-11-30 ENCOUNTER — Ambulatory Visit (INDEPENDENT_AMBULATORY_CARE_PROVIDER_SITE_OTHER): Payer: PPO | Admitting: Internal Medicine

## 2015-11-30 VITALS — BP 122/70 | HR 65 | Ht 68.0 in | Wt 164.6 lb

## 2015-11-30 DIAGNOSIS — W57XXXA Bitten or stung by nonvenomous insect and other nonvenomous arthropods, initial encounter: Secondary | ICD-10-CM

## 2015-11-30 DIAGNOSIS — I5022 Chronic systolic (congestive) heart failure: Secondary | ICD-10-CM | POA: Diagnosis not present

## 2015-11-30 DIAGNOSIS — T148 Other injury of unspecified body region: Secondary | ICD-10-CM | POA: Diagnosis not present

## 2015-11-30 LAB — CUP PACEART INCLINIC DEVICE CHECK
Battery Remaining Longevity: 84 mo
Battery Voltage: 2.98 V
Brady Statistic RV Percent Paced: 99.84 %
Implantable Lead Implant Date: 20160111
Implantable Lead Location: 753859
Lead Channel Impedance Value: 300 Ohm
Lead Channel Impedance Value: 437.5 Ohm
Lead Channel Pacing Threshold Pulse Width: 0.4 ms
Lead Channel Sensing Intrinsic Amplitude: 12 mV
Lead Channel Setting Pacing Amplitude: 2 V
Lead Channel Setting Pacing Amplitude: 2 V
Lead Channel Setting Pacing Pulse Width: 0.5 ms
Lead Channel Setting Sensing Sensitivity: 4 mV
MDC IDC LEAD IMPLANT DT: 20160111
MDC IDC LEAD IMPLANT DT: 20160111
MDC IDC LEAD LOCATION: 753857
MDC IDC LEAD LOCATION: 753860
MDC IDC LEAD MODEL: 4196
MDC IDC MSMT LEADCHNL LV PACING THRESHOLD AMPLITUDE: 1.75 V
MDC IDC MSMT LEADCHNL LV PACING THRESHOLD AMPLITUDE: 1.75 V
MDC IDC MSMT LEADCHNL LV PACING THRESHOLD PULSEWIDTH: 0.5 ms
MDC IDC MSMT LEADCHNL LV PACING THRESHOLD PULSEWIDTH: 0.5 ms
MDC IDC MSMT LEADCHNL RA IMPEDANCE VALUE: 412.5 Ohm
MDC IDC MSMT LEADCHNL RA PACING THRESHOLD AMPLITUDE: 1 V
MDC IDC MSMT LEADCHNL RA PACING THRESHOLD PULSEWIDTH: 0.4 ms
MDC IDC MSMT LEADCHNL RA SENSING INTR AMPL: 5 mV
MDC IDC MSMT LEADCHNL RV PACING THRESHOLD AMPLITUDE: 0.75 V
MDC IDC SESS DTM: 20170727172441
MDC IDC SET LEADCHNL LV PACING AMPLITUDE: 2.5 V
MDC IDC SET LEADCHNL RV PACING PULSEWIDTH: 0.4 ms
MDC IDC STAT BRADY RA PERCENT PACED: 3.1 %
Pulse Gen Serial Number: 7714017

## 2015-11-30 MED ORDER — DOXYCYCLINE HYCLATE 100 MG PO CAPS: 100.0000 mg | ORAL_CAPSULE | Freq: Two times a day (BID) | ORAL | 0 refills | Status: DC

## 2015-11-30 NOTE — Patient Instructions (Addendum)
Medication Instructions:  Your physician has recommended you make the following change in your medication:  1) Start Doxycycline 100 mg twice daily---if fever and chills do not get better call Dr Kathi Der office   Labwork: None ordered   Testing/Procedures: None ordered   Follow-Up: Your physician wants you to follow-up in: 12 months with Dr Court Joy will receive a reminder letter in the mail two months in advance. If you don't receive a letter, please call our office to schedule the follow-up appointment.   Remote monitoring is used to monitor your Pacemaker  from home. This monitoring reduces the number of office visits required to check your device to one time per year. It allows [place] to keep an eye on the functioning of your device to ensure it is working properly. You are scheduled for a device check from home on 02/29/16. You may send your transmission at any time that day. If you have a wireless device, the transmission will be sent automatically. After your physician reviews your transmission, you will receive a postcard with your next transmission date.    Any Other Special Instructions Will Be Listed Below (If Applicable).     If you need a refill on your cardiac medications before your next appointment, please call your pharmacy.

## 2015-11-30 NOTE — Progress Notes (Signed)
HPI Christopher Padilla returns today for followup. He is a pleasant 75 yo man with severe vascular disease and chronic systolic heart failure, and LBBB who underwent BiV PPM insertion over a year ago. In the interim he has been bitten by a tick and taken the tick off earlier today. He has had fever and chills for several days. No syncope or worsening CHF symptoms. Allergies  Allergen Reactions  . Codeine Other (See Comments)    hallucinations  . Ciprofloxacin Diarrhea  . Nsaids Other (See Comments)    GI upset     Current Outpatient Prescriptions  Medication Sig Dispense Refill  . ACCU-CHEK SOFTCLIX LANCETS lancets Check BG 4Xdaily DX E11.65 300 each 1  . aspirin 81 MG tablet Take 81 mg by mouth daily.      . Blood Glucose Monitoring Suppl (ACCU-CHEK AVIVA PLUS) W/DEVICE KIT Check BG 4XDaily DX E11.65 1 kit 0  . carvedilol (COREG) 25 MG tablet Take 1 tablet (25 mg total) by mouth 2 (two) times daily with a meal. 60 tablet 3  . Cholecalciferol (VITAMIN D PO) Take 1,000 Units by mouth daily.     Marland Kitchen gabapentin (NEURONTIN) 600 MG tablet Take 600 mg by mouth 3 (three) times daily.    Marland Kitchen glucose blood (ACCU-CHEK AVIVA PLUS) test strip Check BG 4xdaily DX Ell.65 300 each 1  . insulin aspart (NOVOLOG) 100 UNIT/ML injection Inject 0-20 units as directed per sliding scale 30 mL 6  . insulin glargine (LANTUS) 100 UNIT/ML injection Inject 32 Units into the skin at bedtime.    . Insulin Syringe-Needle U-100 (BD INSULIN SYRINGE ULTRAFINE) 31G X 15/64" 0.5 ML MISC Use once daily 100 each 1  . levothyroxine (SYNTHROID, LEVOTHROID) 125 MCG tablet TAKE 1 TABLET (125 MCG TOTAL) BY MOUTH DAILY. 30 tablet 2  . nortriptyline (PAMELOR) 25 MG capsule TAKE 1 CAPSULE (25 MG TOTAL) BY MOUTH AT BEDTIME. 30 capsule 1  . omeprazole (PRILOSEC) 20 MG capsule Take 20 mg by mouth every other day.    . simvastatin (ZOCOR) 40 MG tablet TAKE 1 TABLET (40 MG TOTAL) BY MOUTH AT BEDTIME. 90 tablet 3  . tetrahydrozoline (EYE  DROPS) 0.05 % ophthalmic solution Apply 1-2 drops to eye daily.    Marland Kitchen doxycycline (VIBRAMYCIN) 100 MG capsule Take 1 capsule (100 mg total) by mouth 2 (two) times daily. 20 capsule 0   No current facility-administered medications for this visit.    Facility-Administered Medications Ordered in Other Visits  Medication Dose Route Frequency Provider Last Rate Last Dose  . 0.9 %  sodium chloride infusion   Intravenous Continuous Angelia Mould, MD         Past Medical History:  Diagnosis Date  . Arthritis    "left hand" (08/21/2015)  . CAD (coronary artery disease)    S/P CABG, Feb 2003, LIMA to the LAD, left free radial artery to an obtuse marginal, spahenous vein graft to diagonal, saphenous vein graft to RCA with endarterectomy  . Cerebrovascular disease    MRA in 2005 with 75% stenosis in distal right vertebral artery and 75% stenosis greater in distal cervical internal carotid artery on teh right, severe bilateral disease and MRA of the extracranial circulation, high grade stenosis of the distal right internal carotid artery at the junction of the cervical internal carotid artery of the skull base  . CKD stage 3 due to type 2 diabetes mellitus ([place])   . Collapsed lung   . Complete heart block (  [place]) 05/14/2014   St. Jude BiV PM (serial number [id number]) pacemaker  . GERD (gastroesophageal reflux disease)   . History of blood transfusion 2002   "after my heart attack"  . History of stomach ulcers 1960s  . History of tobacco use    Quit in 1990  . Hyperlipidemia   . Hypertension   . Hypothyroidism   . Myocardial infarction ([place]) 2002  . Osteopenia 2016  . Peripheral vascular disease ([place])    left hand  . PONV (postoperative nausea and vomiting)   . Presence of permanent cardiac pacemaker   . Type II diabetes mellitus (HCC)     ROS:   All systems reviewed and negative except as noted in the HPI.   Past Surgical History:  Procedure Laterality Date  . AMPUTATION  10/17/2011    Procedure: AMPUTATION DIGIT;  Surgeon: Newt Minion, MD;  Location: [facility];  Service: Orthopedics;  Laterality: Left;  Amputation Left Index Finger at PIP Joint  . ARTERIAL BYPASS SURGRY     Left; left femoral to posterior tibial bypass gracting, left femoral artery and deep femoral artery endarterectomy with vein patch angioplasty of the common femoral arter and deep femoral artery 06/2005, right fremoral popliteal bypass, right iliac artery restent, bilateral below knee amputations  . BI-VENTRICULAR PACEMAKER INSERTION N/A 05/16/2014   Procedure: BI-VENTRICULAR PACEMAKER INSERTION (CRT-P);  Surgeon: Evans Lance, MD; St. Jude BiV PM (serial number [id number]) pacemaker  . CARDIAC CATHETERIZATION    . CARPAL TUNNEL RELEASE Right   . CATARACT EXTRACTION W/ INTRAOCULAR LENS  IMPLANT, BILATERAL Bilateral   . CORONARY ARTERY BYPASS GRAFT  2002   CABG X "4", By Ivin Poot, MD  . ENDARTERECTOMY Left 09/07/2015   Procedure: ENDARTERECTOMY LEFT  BRACHIAL ARTERY ;  Surgeon: Angelia Mould, MD;  Location: [facility];  Service: Vascular;  Laterality: Left;  . INSERT / REPLACE / REMOVE PACEMAKER    . LEFT HEART CATHETERIZATION WITH CORONARY ANGIOGRAM N/A 05/10/2014   Procedure: LEFT HEART CATHETERIZATION WITH CORONARY ANGIOGRAM;  Surgeon: Burnell Blanks, MD;  Location[facility] C[facility] LAB;  Service: Cardiovascular;  Laterality: N/A;  . LEG AMPUTATION BELOW KNEE Bilateral 2001-2004  . PATCH ANGIOPLASTY Left 09/07/2015   Procedure: LEFT BRACHIAL ARTERY PATCH ANGIOPLASTY USING XENOSURE BIOLOGIC PATCH;  Surgeon: Angelia Mould, MD;  Location: [facility];  Service: Vascular;  Laterality: Left;  . PERIPHERAL VASCULAR CATHETERIZATION N/A 08/21/2015   Procedure: Upper Extremity Angiography;  Surgeon: Angelia Mould, MD;  Location: [facility] CV LAB;  Service: Cardiovascular;  Laterality: N/A;  . PERIPHERAL VASCULAR CATHETERIZATION N/A 08/21/2015   Procedure: Aortic Arch Angiography;  Surgeon: Angelia Mould, MD;  Location: [place] CV LAB;  Service: Cardiovascular;  Laterality: N/A;  . TEMPORARY PACEMAKER INSERTION N/A 05/14/2014   Procedure: TEMPORARY PACEMAKER INSERTION;  Surgeon: Blane Ohara, MD;  Location: [facility] CATH LAB;  Service: Cardiovascular;  Laterality: N/A;  . THORACOTOMY     With drainage of a hemathroax and decortication of fibrothorax     Family History  Problem Relation Age of Onset  . Cancer Mother     brain and lung  . Diabetes Father 76  . Coronary artery disease Father   . Colon cancer Neg Hx   . Diabetes Sister   . Diabetes Brother   . Diabetes Son      Social History   Social History  . Marital status: Married    Spouse name: N/A  . Number of  children: N/A  . Years of education: N/A   Occupational History  . Retired    Social History Main Topics  . Smoking status: Former Smoker    Packs/day: 1.00    Types: Cigarettes    Start date: 11/04/1954    Quit date: 05/06/1988  . Smokeless tobacco: Never Used  . Alcohol use No  . Drug use: No  . Sexual activity: Not Currently   Other Topics Concern  . Not on file   Social History Narrative   Married with 2 children     BP 122/70   Pulse 65   Ht _0  (1.727 m)   Wt 164 lb 9.6 oz (74.7 kg)   BMI 25.03 kg/m   Physical Exam:  Well appearing 75 yo man, NAD HEENT: Unremarkable Neck:  6 cm JVD, no thyromegally Lymphatics:  No adenopathy Back:  No CVA tenderness Lungs:  Clear with no wheezes HEART:  Regular rate rhythm, no murmurs, no rubs, no clicks Abd:  soft, positive bowel sounds, no organomegally, no rebound, no guarding Ext:  Bilateral amputations with prosthesis in place. Skin:  No rashes no nodules Neuro:  CN II through XII intact, motor grossly intact   DEVICE  Normal device function.  See PaceArt for details.   Assess/Plan: 1. Tick bite - I have prescribed him doxycycline 100 mg twice daily for 10 days. 2. Chronic systolic heart failure - his symptoms are class 2. 3.  BiV PPM - his St. Jude device is working normally. Will recheck in several months. 4. Peripheral vascular disease - he is s/p Bilateral BKA. No change in meds.

## 2015-12-01 ENCOUNTER — Encounter: Payer: Self-pay | Admitting: Internal Medicine

## 2015-12-06 ENCOUNTER — Encounter: Payer: Self-pay | Admitting: Family Medicine

## 2015-12-06 ENCOUNTER — Ambulatory Visit (INDEPENDENT_AMBULATORY_CARE_PROVIDER_SITE_OTHER): Payer: PPO | Admitting: Family Medicine

## 2015-12-06 VITALS — BP 119/61 | HR 64 | Temp 96.2°F | Ht 68.0 in | Wt 168.0 lb

## 2015-12-06 DIAGNOSIS — T148 Other injury of unspecified body region: Secondary | ICD-10-CM

## 2015-12-06 DIAGNOSIS — I2510 Atherosclerotic heart disease of native coronary artery without angina pectoris: Secondary | ICD-10-CM

## 2015-12-06 DIAGNOSIS — N183 Chronic kidney disease, stage 3 unspecified: Secondary | ICD-10-CM

## 2015-12-06 DIAGNOSIS — Z794 Long term (current) use of insulin: Secondary | ICD-10-CM

## 2015-12-06 DIAGNOSIS — E1165 Type 2 diabetes mellitus with hyperglycemia: Secondary | ICD-10-CM

## 2015-12-06 DIAGNOSIS — E034 Atrophy of thyroid (acquired): Secondary | ICD-10-CM

## 2015-12-06 DIAGNOSIS — I739 Peripheral vascular disease, unspecified: Secondary | ICD-10-CM | POA: Diagnosis not present

## 2015-12-06 DIAGNOSIS — W57XXXA Bitten or stung by nonvenomous insect and other nonvenomous arthropods, initial encounter: Secondary | ICD-10-CM

## 2015-12-06 DIAGNOSIS — E1140 Type 2 diabetes mellitus with diabetic neuropathy, unspecified: Secondary | ICD-10-CM

## 2015-12-06 DIAGNOSIS — E559 Vitamin D deficiency, unspecified: Secondary | ICD-10-CM

## 2015-12-06 DIAGNOSIS — E038 Other specified hypothyroidism: Secondary | ICD-10-CM | POA: Diagnosis not present

## 2015-12-06 DIAGNOSIS — I5042 Chronic combined systolic (congestive) and diastolic (congestive) heart failure: Secondary | ICD-10-CM

## 2015-12-06 DIAGNOSIS — E785 Hyperlipidemia, unspecified: Secondary | ICD-10-CM | POA: Diagnosis not present

## 2015-12-06 DIAGNOSIS — I10 Essential (primary) hypertension: Secondary | ICD-10-CM

## 2015-12-06 DIAGNOSIS — E1122 Type 2 diabetes mellitus with diabetic chronic kidney disease: Secondary | ICD-10-CM | POA: Diagnosis not present

## 2015-12-06 DIAGNOSIS — IMO0002 Reserved for concepts with insufficient information to code with codable children: Secondary | ICD-10-CM

## 2015-12-06 DIAGNOSIS — E1139 Type 2 diabetes mellitus with other diabetic ophthalmic complication: Secondary | ICD-10-CM | POA: Diagnosis not present

## 2015-12-06 DIAGNOSIS — IMO0001 Reserved for inherently not codable concepts without codable children: Secondary | ICD-10-CM

## 2015-12-06 DIAGNOSIS — N400 Enlarged prostate without lower urinary tract symptoms: Secondary | ICD-10-CM | POA: Diagnosis not present

## 2015-12-06 MED ORDER — DOXYCYCLINE HYCLATE 100 MG PO CAPS: 100.0000 mg | ORAL_CAPSULE | Freq: Two times a day (BID) | ORAL | 0 refills | Status: DC

## 2015-12-06 NOTE — Progress Notes (Signed)
Subjective:    Patient ID: Christopher Padilla, male    DOB: 05-Sep-1940, 75 y.o.   MRN: [id number]  HPI Pt here for follow up and management of chronic medical problems which includes diabetes and hypertension. He is taking medications regularly.The patient has had several tick bites and he thinks that most of these were wood ticks or dog ticks. He feels bad with fatigue and tiredness just malaise. He recently saw the cardiologist and had an echocardiogram and was given a good report from his heart standpoint. He is had a left brachial endarterectomy and is followed regularly by the vascular surgeon for this. His left hand and arm feel much better even though he has a lot of stiffness and arthralgias in both hands. He denies any chest pain but does have a shortness of breath and fatigue with what could be tick disease. He has no rash. His GI tract is working well with no nausea vomiting diarrhea blood in the stool or black tarry bowel movements. He is passing his water as usual. He is a below the knee amputee because of vascular insufficiency and diabetes and this dose for both lower extremities. He is followed regularly by the orthopedic surgeon for a callus and irritation to his left stump. He's passing his water without problems. In general he looks like he feels bad. He is taking his doxycycline twice daily with food but has had some some nausea but no diarrhea with this.    Patient Active Problem List   Diagnosis Date Noted  . Ischemia of upper extremity 09/07/2015  . Atherosclerosis of arteries of extremities ([place]) 08/21/2015  . PAD (peripheral artery disease) ([place]) 08/21/2015  . Hypothyroidism due to acquired atrophy of thyroid 07/31/2015  . Vitamin D deficiency 07/31/2015  . Essential hypertension 06/30/2015  . Biventricular cardiac pacemaker in situ 08/22/2014  . Osteopenia 07/25/2014  . CKD stage 3 due to type 2 diabetes mellitus ([place])   . Cardiomyopathy, ischemic   . Coronary artery  disease involving native coronary artery of native heart without angina pectoris   . Complete heart block ([place]) 05/14/2014  . Syncope 04/18/2014  . Hypertension associated with diabetes ([place]) 04/18/2014  . Hx of BKA, bilateral 05/03/2013  . Mitral regurgitation 07/03/2011  . 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 Hyperlipidemia LDL goal <70 11/05/2008  . CEREBROVASCULAR DISEASE 11/05/2008  . TOBACCO ABUSE, HX OF 11/05/2008  . Combined systolic and diastolic heart failure ([place]) 10/04/2008   Outpatient Encounter Prescriptions as of 12/06/2015  Medication Sig  . ACCU-CHEK SOFTCLIX LANCETS lancets Check BG 4Xdaily DX E11.65  . aspirin 81 MG tablet Take 81 mg by mouth daily.    . Blood Glucose Monitoring Suppl (ACCU-CHEK AVIVA PLUS) W/DEVICE KIT Check BG 4XDaily DX E11.65  . carvedilol (COREG) 25 MG tablet Take 1 tablet (25 mg total) by mouth 2 (two) times daily with a meal.  . Cholecalciferol (VITAMIN D PO) Take 1,000 Units by mouth daily.   Marland Kitchen doxycycline (VIBRAMYCIN) 100 MG capsule Take 1 capsule (100 mg total) by mouth 2 (two) times daily.  Marland Kitchen gabapentin (NEURONTIN) 600 MG tablet Take 600 mg by mouth 3 (three) times daily.  Marland Kitchen glucose blood (ACCU-CHEK AVIVA PLUS) test strip Check BG 4xdaily DX Ell.65  . insulin aspart (NOVOLOG) 100 UNIT/ML injection Inject 0-20 units as directed per sliding scale  . insulin glargine (LANTUS) 100 UNIT/ML injection Inject 32 Units into the skin at bedtime.  . Insulin Syringe-Needle U-100 (BD  INSULIN SYRINGE ULTRAFINE) 31G X 15/64" 0.5 ML MISC Use once daily  . levothyroxine (SYNTHROID, LEVOTHROID) 125 MCG tablet TAKE 1 TABLET (125 MCG TOTAL) BY MOUTH DAILY.  . nortriptyline (PAMELOR) 25 MG capsule TAKE 1 CAPSULE (25 MG TOTAL) BY MOUTH AT BEDTIME.  . omeprazole (PRILOSEC) 20 MG capsule Take 20 mg by mouth every other day.  . simvastatin (ZOCOR) 40 MG tablet TAKE 1 TABLET (40 MG TOTAL) BY MOUTH AT BEDTIME.  .  tetrahydrozoline (EYE DROPS) 0.05 % ophthalmic solution Apply 1-2 drops to eye daily.   Facility-Administered Encounter Medications as of 12/06/2015  Medication  . 0.9 %  sodium chloride infusion     Review of Systems  Constitutional: Positive for fatigue.  HENT: Negative.   Eyes: Negative.   Respiratory: Negative.   Cardiovascular: Negative.        Swelling ans redness of left arm from recent surgery.  Gastrointestinal: Negative.   Endocrine: Negative.   Genitourinary: Negative.   Musculoskeletal: Positive for arthralgias and myalgias.  Skin: Negative.   Allergic/Immunologic: Negative.   Neurological: Negative.   Hematological: Negative.   Psychiatric/Behavioral: Negative.        Objective:   Physical Exam  Constitutional: He is oriented to person, place, and time. He appears well-developed and well-nourished. He appears distressed.  The patient appears that he is not feeling good. He is a bilateral low the knee amputee. He appears somewhat distressed.  HENT:  Head: Normocephalic and atraumatic.  Right Ear: External ear normal.  Left Ear: External ear normal.  Nose: Nose normal.  Mouth/Throat: No oropharyngeal exudate.  The mouth was very dry otherwise normal.  Eyes: Conjunctivae and EOM are normal. Pupils are equal, round, and reactive to light. Right eye exhibits no discharge. Left eye exhibits no discharge. No scleral icterus.  Neck: Normal range of motion. Neck supple. No thyromegaly present.  No thyromegaly or anterior cervical adenopathy  Cardiovascular: Normal rate and regular rhythm.   No murmur heard. The heart has a regular rate and rhythm at 72/m  Pulmonary/Chest: Effort normal and breath sounds normal. No respiratory distress. He has no wheezes. He has no rales. He exhibits no tenderness.  Clear anteriorly and posteriorly  Abdominal: Soft. Bowel sounds are normal. He exhibits distension. He exhibits no mass. There is no tenderness. There is no rebound and no  guarding.  The patient was examined sitting in the chair. There is no obvious spleen or liver enlargement and no abdominal tenderness. There was a lot of gaseous distention.  Musculoskeletal: He exhibits deformity. He exhibits no edema or tenderness.  The patient has a lot of stiffness and arthralgias in both hands but does have good grip strength. His prostheses are in place for both lower extremities. The left lower extremity stop has a callus with minimal redness and no drainage.  Lymphadenopathy:    He has no cervical adenopathy.  Neurological: He is alert and oriented to person, place, and time.  Skin: Skin is warm and dry. No rash noted.  Psychiatric: He has a normal mood and affect. His behavior is normal. Judgment and thought content normal.  The patient's mood and affect reflected his fatigue and tiredness and being somewhat down and depressed. 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  Nursing note and vitals reviewed.  BP 119/61 (BP Location: Right Arm)   Pulse 64   Temp (!) 96.2 F (35.7 C) (Oral)     Ht 5' 8" (1.727 m)   Wt 168 lb (76.2 kg)   BMI 25.54 kg/m         Assessment & Plan:  1. CKD stage 3 due to type 2 diabetes mellitus (HCC) -Continue to avoid anti-inflammatory medicines and keep blood pressure under good control - BMP8+EGFR; Future - CBC with Differential/Platelet; Future  2. Hypothyroidism due to acquired atrophy of thyroid -Continue current treatment pending results of lab work - CBC with Differential/Platelet; Future - Thyroid Panel With TSH; Future  3. 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 Hyperlipidemia LDL goal <70 -Continue simvastatin and as aggressive therapeutic lifestyle changes as  possible - CBC with Differential/Platelet; Future - Hepatic function panel; Future - Lipid panel; Future  5. Essential hypertension -Continue blood pressure medicine and sodium restriction - BMP8+EGFR; Future - CBC with Differential/Platelet; Future - Hepatic function panel; Future  6. BPH (benign prostatic hyperplasia) -No complaints today with voiding including burning. - CBC with Differential/Platelet; Future  7. Vitamin D deficiency -Continue current treatment pending results of lab work - CBC with Differential/Platelet; Future - VITAMIN D 25 Hydroxy (Vit-D Deficiency, Fractures); Future  8. Tick bite -Continue antibiotic twice daily with food for 3 weeks - CBC with Differential/Platelet; Future - Lyme Ab/Western Blot Reflex; Future - [place] mtn spotted fvr abs pnl(IgG+IgM); Future  9. Type 2 diabetes, uncontrolled, with neuropathy (HCC) -Continue aggressive therapeutic lifestyle changes and good blood sugar control  10. Peripheral vascular insufficiency (HCC) -Continue follow-up with vascular surgeon  11. Coronary artery disease involving native coronary artery of native heart without angina pectoris -Continue follow-up with cardiology and current statin therapy  12. Chronic combined systolic and diastolic heart failure (HCC) -Continue follow-up with cardiology  Meds ordered this encounter  Medications  . doxycycline (VIBRAMYCIN) 100 MG capsule    Sig: Take 1 capsule (100 mg total) by mouth 2 (two) times daily.    Dispense:  14 capsule    Refill:  0   Patient Instructions                       Medicare Annual Wellness Visit  [place] and the medical providers at [facility] strive to bring you the best medical care.  In doing so we not only want to address your current medical conditions and concerns but also to detect new conditions early and prevent illness, disease and health-related problems.    Medicare offers a yearly Wellness  Visit which allows our clinical staff to assess your need for preventative services including immunizations, lifestyle education, counseling to decrease risk of preventable diseases and screening for fall risk and other medical concerns.    This visit is provided free of charge (no copay) for all Medicare recipients. The clinical pharmacists at [facility] have begun to conduct these Wellness Visits which will also include a thorough review of all your medications.    As you primary medical provider recommend that you make an appointment for your Annual Wellness Visit if you have not done so already this year.  You may set up this appointment before you leave today or you may call back ([phone]) and schedule an appointment.  Please make sure when you call that you mention that you are scheduling your Annual Wellness Visit with the clinical pharmacist so that the   appointment may be made for the proper length of time.     Continue current medications. Continue good therapeutic lifestyle changes which include good diet and exercise. Fall precautions discussed with patient. If an FOBT was given today- please return it to our front desk. If you are over 50 years old - you may need Prevnar 13 or the adult Pneumonia vaccine.  **Flu shots are available--- please call and schedule a FLU-CLINIC appointment**  After your visit with us today you will receive a survey in the mail or online from [employer] regarding your care with us. Please take a moment to fill this out. Your feedback is very important to us as you can help us better understand your patient needs as well as improve your experience and satisfaction. WE CARE ABOUT YOU!!!   Follow up with cardiology  Continue DOXY antibiotic for an extra week - (3 weeks total) Return for fasting labs  Return to the office in 2 weeks. Continue to follow-up with orthopedics  Don W.  MD   

## 2015-12-06 NOTE — Patient Instructions (Addendum)
Medicare Annual Wellness Visit  [place] and the medical providers at [facility] strive to bring you the best medical care.  In doing so we not only want to address your current medical conditions and concerns but also to detect new conditions early and prevent illness, disease and health-related problems.    Medicare offers a yearly Wellness Visit which allows our clinical staff to assess your need for preventative services including immunizations, lifestyle education, counseling to decrease risk of preventable diseases and screening for fall risk and other medical concerns.    This visit is provided free of charge (no copay) for all Medicare recipients. The clinical pharmacists at [facility] have begun to conduct these Wellness Visits which will also include a thorough review of all your medications.    As you primary medical provider recommend that you make an appointment for your Annual Wellness Visit if you have not done so already this year.  You may set up this appointment before you leave today or you may call back ([phone]) and schedule an appointment.  Please make sure when you call that you mention that you are scheduling your Annual Wellness Visit with the clinical pharmacist so that the appointment may be made for the proper length of time.     Continue current medications. Continue good therapeutic lifestyle changes which include good diet and exercise. Fall precautions discussed with patient. If an FOBT was given today- please return it to our front desk. If you are over 51 years old - you may need Prevnar 13 or the adult Pneumonia vaccine.  **Flu shots are available--- please call and schedule a FLU-CLINIC appointment**  After your visit with [place] today you will receive a survey in the mail or online from [employer] regarding your care with [place]. Please take a moment to fill this out. Your feedback is very  important to [place] as you can help [place] better understand your patient needs as well as improve your experience and satisfaction. WE CARE ABOUT YOU!!!   Follow up with cardiology  Continue DOXY antibiotic for an extra week - (3 weeks total) Return for fasting labs  Return to the office in 2 weeks. Continue to follow-up with orthopedics

## 2015-12-07 ENCOUNTER — Other Ambulatory Visit: Payer: PPO

## 2015-12-07 DIAGNOSIS — E785 Hyperlipidemia, unspecified: Secondary | ICD-10-CM | POA: Diagnosis not present

## 2015-12-07 DIAGNOSIS — W57XXXA Bitten or stung by nonvenomous insect and other nonvenomous arthropods, initial encounter: Secondary | ICD-10-CM | POA: Diagnosis not present

## 2015-12-07 DIAGNOSIS — E034 Atrophy of thyroid (acquired): Secondary | ICD-10-CM | POA: Diagnosis not present

## 2015-12-07 DIAGNOSIS — N400 Enlarged prostate without lower urinary tract symptoms: Secondary | ICD-10-CM | POA: Diagnosis not present

## 2015-12-07 DIAGNOSIS — Z794 Long term (current) use of insulin: Secondary | ICD-10-CM

## 2015-12-07 DIAGNOSIS — I10 Essential (primary) hypertension: Secondary | ICD-10-CM | POA: Diagnosis not present

## 2015-12-07 DIAGNOSIS — E1139 Type 2 diabetes mellitus with other diabetic ophthalmic complication: Secondary | ICD-10-CM | POA: Diagnosis not present

## 2015-12-07 DIAGNOSIS — N183 Chronic kidney disease, stage 3 (moderate): Secondary | ICD-10-CM | POA: Diagnosis not present

## 2015-12-07 DIAGNOSIS — E559 Vitamin D deficiency, unspecified: Secondary | ICD-10-CM | POA: Diagnosis not present

## 2015-12-07 DIAGNOSIS — E1122 Type 2 diabetes mellitus with diabetic chronic kidney disease: Secondary | ICD-10-CM | POA: Diagnosis not present

## 2015-12-07 DIAGNOSIS — E038 Other specified hypothyroidism: Secondary | ICD-10-CM | POA: Diagnosis not present

## 2015-12-07 DIAGNOSIS — IMO0001 Reserved for inherently not codable concepts without codable children: Secondary | ICD-10-CM

## 2015-12-07 DIAGNOSIS — T148 Other injury of unspecified body region: Secondary | ICD-10-CM | POA: Diagnosis not present

## 2015-12-08 DIAGNOSIS — Z89512 Acquired absence of left leg below knee: Secondary | ICD-10-CM | POA: Diagnosis not present

## 2015-12-08 DIAGNOSIS — L97221 Non-pressure chronic ulcer of left calf limited to breakdown of skin: Secondary | ICD-10-CM | POA: Diagnosis not present

## 2015-12-12 ENCOUNTER — Other Ambulatory Visit: Payer: Self-pay | Admitting: Family Medicine

## 2015-12-12 LAB — HGB A1C W/O EAG: Hgb A1c MFr Bld: 10.8 % — ABNORMAL HIGH (ref 4.8–5.6)

## 2015-12-12 LAB — SPECIMEN STATUS REPORT

## 2015-12-13 LAB — LIPID PANEL
Chol/HDL Ratio: 2.9 ratio units (ref 0.0–5.0)
Cholesterol, Total: 88 mg/dL — ABNORMAL LOW (ref 100–199)
HDL: 30 mg/dL — AB (ref >39)
LDL Calculated: 46 mg/dL (ref 0–99)
Triglycerides: 58 mg/dL (ref 0–149)
VLDL Cholesterol Cal: 12 mg/dL (ref 5–40)

## 2015-12-13 LAB — BMP8+EGFR
BUN/Creatinine Ratio: 13 (ref 10–24)
BUN: 16 mg/dL (ref 8–27)
CALCIUM: 8.7 mg/dL (ref 8.6–10.2)
CHLORIDE: 98 mmol/L (ref 96–106)
CO2: 27 mmol/L (ref 18–29)
Creatinine, Ser: 1.21 mg/dL (ref 0.76–1.27)
GFR calc non Af Amer: 57 mL/min/{1.73_m2} — ABNORMAL LOW (ref >59)
GFR, EST AFRICAN AMERICAN: 66 mL/min/{1.73_m2} (ref >59)
GLUCOSE: 191 mg/dL — AB (ref 65–99)
POTASSIUM: 4.9 mmol/L (ref 3.5–5.2)
Sodium: 139 mmol/L (ref 134–144)

## 2015-12-13 LAB — THYROID PANEL WITH TSH
FREE THYROXINE INDEX: 2.1 (ref 1.2–4.9)
T3 Uptake Ratio: 31 % (ref 24–39)
T4, Total: 6.9 ug/dL (ref 4.5–12.0)
TSH: 1.5 u[IU]/mL (ref 0.450–4.500)

## 2015-12-13 LAB — LYME AB/WESTERN BLOT REFLEX

## 2015-12-13 LAB — ROCKY MTN SPOTTED FVR ABS PNL(IGG+IGM)
RMSF IGG: NEGATIVE
RMSF IGM: 1.21 {index} — AB (ref 0.00–0.89)

## 2015-12-13 LAB — CBC WITH DIFFERENTIAL/PLATELET
BASOS ABS: 0 10*3/uL (ref 0.0–0.2)
Basos: 1 %
EOS (ABSOLUTE): 0.2 10*3/uL (ref 0.0–0.4)
Eos: 3 %
Hematocrit: 41.4 % (ref 37.5–51.0)
Hemoglobin: 12.8 g/dL (ref 12.6–17.7)
IMMATURE GRANS (ABS): 0 10*3/uL (ref 0.0–0.1)
Immature Granulocytes: 0 %
LYMPHS: 34 %
Lymphocytes Absolute: 1.6 10*3/uL (ref 0.7–3.1)
MCH: 28.1 pg (ref 26.6–33.0)
MCHC: 30.9 g/dL — AB (ref 31.5–35.7)
MCV: 91 fL (ref 79–97)
Monocytes Absolute: 0.6 10*3/uL (ref 0.1–0.9)
Monocytes: 12 %
NEUTROS ABS: 2.4 10*3/uL (ref 1.4–7.0)
Neutrophils: 50 %
PLATELETS: 198 10*3/uL (ref 150–379)
RBC: 4.55 x10E6/uL (ref 4.14–5.80)
RDW: 14.7 % (ref 12.3–15.4)
WBC: 4.7 10*3/uL (ref 3.4–10.8)

## 2015-12-13 LAB — HEPATIC FUNCTION PANEL
ALT: 29 IU/L (ref 0–44)
AST: 37 IU/L (ref 0–40)
Albumin: 3.3 g/dL — ABNORMAL LOW (ref 3.5–4.8)
Alkaline Phosphatase: 301 IU/L — ABNORMAL HIGH (ref 39–117)
BILIRUBIN, DIRECT: 0.47 mg/dL — AB (ref 0.00–0.40)
Bilirubin Total: 0.8 mg/dL (ref 0.0–1.2)
TOTAL PROTEIN: 6.9 g/dL (ref 6.0–8.5)

## 2015-12-13 LAB — VITAMIN D 25 HYDROXY (VIT D DEFICIENCY, FRACTURES): VIT D 25 HYDROXY: 42.1 ng/mL (ref 30.0–100.0)

## 2015-12-14 ENCOUNTER — Other Ambulatory Visit: Payer: Self-pay | Admitting: *Deleted

## 2015-12-14 ENCOUNTER — Other Ambulatory Visit: Payer: Self-pay | Admitting: Family Medicine

## 2015-12-14 DIAGNOSIS — I708 Atherosclerosis of other arteries: Secondary | ICD-10-CM

## 2015-12-20 ENCOUNTER — Encounter: Payer: Self-pay | Admitting: Family Medicine

## 2015-12-20 ENCOUNTER — Ambulatory Visit (INDEPENDENT_AMBULATORY_CARE_PROVIDER_SITE_OTHER): Payer: PPO | Admitting: Family Medicine

## 2015-12-20 VITALS — BP 109/50 | HR 62 | Temp 97.0°F | Ht 68.0 in | Wt 163.0 lb

## 2015-12-20 DIAGNOSIS — A770 Spotted fever due to Rickettsia rickettsii: Secondary | ICD-10-CM | POA: Diagnosis not present

## 2015-12-20 DIAGNOSIS — R609 Edema, unspecified: Secondary | ICD-10-CM | POA: Diagnosis not present

## 2015-12-20 DIAGNOSIS — R600 Localized edema: Secondary | ICD-10-CM

## 2015-12-20 DIAGNOSIS — W57XXXA Bitten or stung by nonvenomous insect and other nonvenomous arthropods, initial encounter: Secondary | ICD-10-CM | POA: Diagnosis not present

## 2015-12-20 MED ORDER — FUROSEMIDE 40 MG PO TABS: 40.0000 mg | ORAL_TABLET | Freq: Every day | ORAL | 11 refills | Status: DC

## 2015-12-20 NOTE — Progress Notes (Signed)
Subjective:    Patient ID: Christopher Padilla, male    DOB: 11-05-1940, 75 y.o.   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 He is finishing up or has artificial is his doxycycline which was extended for 2 more weeks after the last visit with a positive tests for [facility] spotted fever.        Patient Active Problem List   Diagnosis Date Noted  . Ischemia of upper extremity 09/07/2015  . Atherosclerosis of arteries of extremities ([place]) 08/21/2015  . PAD (peripheral artery disease) ([place]) 08/21/2015  . Hypothyroidism due to acquired atrophy of thyroid 07/31/2015  . Vitamin D deficiency 07/31/2015  . Essential hypertension 06/30/2015  . Biventricular cardiac pacemaker in situ 08/22/2014  . Osteopenia 07/25/2014  . CKD stage 3 due to type 2 diabetes mellitus ([place])   . Cardiomyopathy, ischemic   . Coronary artery disease involving native coronary artery of native heart without angina pectoris   . Complete heart block ([place]) 05/14/2014  . Syncope 04/18/2014  . Hypertension associated with diabetes ([place]) 04/18/2014  . Hx of BKA, bilateral 05/03/2013  . Mitral regurgitation 07/03/2011  . 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 Hyperlipidemia LDL goal <70 11/05/2008  . CEREBROVASCULAR DISEASE 11/05/2008  . TOBACCO ABUSE, HX OF 11/05/2008  . Combined systolic and diastolic heart failure ([place]) 10/04/2008   Outpatient Encounter Prescriptions as of 12/20/2015  Medication Sig  . ACCU-CHEK SOFTCLIX LANCETS lancets Check BG 4Xdaily DX E11.65  . aspirin 81 MG tablet Take 81 mg by mouth daily.    . Blood Glucose Monitoring Suppl (ACCU-CHEK AVIVA PLUS) W/DEVICE KIT Check BG 4XDaily DX E11.65  . carvedilol (COREG) 25 MG tablet Take 1 tablet (25 mg total) by mouth 2 (two) times daily with a meal.  .  Cholecalciferol (VITAMIN D PO) Take 1,000 Units by mouth daily.   Marland Kitchen gabapentin (NEURONTIN) 600 MG tablet Take 600 mg by mouth 3 (three) times daily.  Marland Kitchen gabapentin (NEURONTIN) 600 MG tablet TAKE 1 TABLET THREE TIMES A DAY  . glucose blood (ACCU-CHEK AVIVA PLUS) test strip Check BG 4xdaily DX Ell.65  . insulin aspart (NOVOLOG) 100 UNIT/ML injection Inject 0-20 units as directed per sliding scale  . insulin glargine (LANTUS) 100 UNIT/ML injection Inject 32 Units into the skin at bedtime.  . Insulin Syringe-Needle U-100 (BD INSULIN SYRINGE ULTRAFINE) 31G X 15/64" 0.5 ML MISC Use once daily  . levothyroxine (SYNTHROID, LEVOTHROID) 125 MCG tablet TAKE 1 TABLET (125 MCG TOTAL) BY MOUTH DAILY.  . nortriptyline (PAMELOR) 25 MG capsule TAKE 1 CAPSULE (25 MG TOTAL) BY MOUTH AT BEDTIME.  Marland Kitchen omeprazole (PRILOSEC) 20 MG capsule Take 20 mg by mouth every other day.  Marland Kitchen omeprazole (PRILOSEC) 20 MG capsule TAKE 1 CAPSULE BY MOUTH DAILY  . simvastatin (ZOCOR) 40 MG tablet TAKE 1 TABLET (40 MG TOTAL) BY MOUTH AT BEDTIME.  Marland Kitchen tetrahydrozoline (EYE DROPS) 0.05 % ophthalmic solution Apply 1-2 drops to eye daily.  . [DISCONTINUED] doxycycline (VIBRAMYCIN) 100 MG capsule Take 1 capsule (100 mg total) by mouth 2 (two) times daily.   Facility-Administered Encounter Medications as of 12/20/2015  Medication  . 0.9 %  sodium chloride infusion     Review of Systems  Constitutional: Positive for fatigue.  HENT: Negative.   Eyes: Negative.   Respiratory: Negative.   Cardiovascular: Negative.  Gastrointestinal: Negative.   Endocrine: Negative.   Genitourinary: Negative.   Musculoskeletal: Negative.   Skin: Negative.   Allergic/Immunologic: Negative.   Neurological: Negative.   Hematological: Negative.   Psychiatric/Behavioral: Negative.        Objective:   Physical Exam  Constitutional: He is oriented to person, place, and time. No distress.  HENT:  Head: Normocephalic and atraumatic.  Eyes: Conjunctivae and  EOM are normal. Pupils are equal, round, and reactive to light. Right eye exhibits no discharge. Left eye exhibits no discharge. No scleral icterus.  Neck: Normal range of motion.  Cardiovascular: Normal rate and regular rhythm.   No murmur heard. Pulmonary/Chest: Effort normal and breath sounds normal. He has no wheezes. He has no rales.  The patient has a few scattered crackles in both lungs. There is some presacral edema.  Abdominal: Bowel sounds are normal. He exhibits no mass.  Musculoskeletal: He exhibits no edema.  Patient has bilateral prostheses. The sore on the left stump is healing. The patient has some presacral edema.  Neurological: He is alert and oriented to person, place, and time. No cranial nerve deficit.  Skin: Skin is warm and dry. No rash noted.  Psychiatric: He has a normal mood and affect. His behavior is normal. Judgment and thought content normal.  Nursing note and vitals reviewed.   BP (!) 109/50 (BP Location: Right Arm)   Pulse 62   Temp 97 F (36.1 C) (Oral)   Ht 5' 8"  (1.727 m)   Wt 163 lb (73.9 kg)   BMI 24.78 kg/m        Assessment & Plan:  1. Presacral edema -Take Lasix 40 1/2-1 daily as directed - BMP8+EGFR - Brain natriuretic peptide - Hepatic function panel - BMP8+EGFR; Future  2. Tick bite -The patient appears to be doing well and has completed 3 weeks worth of doxycycline. He is getting his strength back and feeling better.  3. [facility] spotted fever -'s test was positive and he is improving and he plans to begin exercising more and trying to eat better.  Patient Instructions  Take lasix as directed We will call you about the labs done today.  In 4 weeks - come to lab for a BMP (kidney panel)    Arrie Senate MD

## 2015-12-20 NOTE — Patient Instructions (Signed)
Take lasix as directed We will call you about the labs done today.  In 4 weeks - come to lab for a BMP (kidney panel)

## 2015-12-21 LAB — BMP8+EGFR
BUN/Creatinine Ratio: 13 (ref 10–24)
BUN: 20 mg/dL (ref 8–27)
CALCIUM: 8.8 mg/dL (ref 8.6–10.2)
CHLORIDE: 97 mmol/L (ref 96–106)
CO2: 27 mmol/L (ref 18–29)
Creatinine, Ser: 1.5 mg/dL — ABNORMAL HIGH (ref 0.76–1.27)
GFR, EST AFRICAN AMERICAN: 51 mL/min/{1.73_m2} — AB (ref >59)
GFR, EST NON AFRICAN AMERICAN: 44 mL/min/{1.73_m2} — AB (ref >59)
Glucose: 243 mg/dL — ABNORMAL HIGH (ref 65–99)
POTASSIUM: 4.8 mmol/L (ref 3.5–5.2)
Sodium: 137 mmol/L (ref 134–144)

## 2015-12-21 LAB — BRAIN NATRIURETIC PEPTIDE: BNP: 723.2 pg/mL — ABNORMAL HIGH (ref 0.0–100.0)

## 2015-12-21 LAB — HEPATIC FUNCTION PANEL
ALBUMIN: 3.3 g/dL — AB (ref 3.5–4.8)
ALT: 18 IU/L (ref 0–44)
AST: 24 IU/L (ref 0–40)
Alkaline Phosphatase: 222 IU/L — ABNORMAL HIGH (ref 39–117)
BILIRUBIN TOTAL: 0.7 mg/dL (ref 0.0–1.2)
Bilirubin, Direct: 0.36 mg/dL (ref 0.00–0.40)
TOTAL PROTEIN: 6.7 g/dL (ref 6.0–8.5)

## 2015-12-23 ENCOUNTER — Other Ambulatory Visit: Payer: Self-pay | Admitting: Family Medicine

## 2015-12-28 ENCOUNTER — Other Ambulatory Visit: Payer: Self-pay | Admitting: Family Medicine

## 2016-01-01 IMAGING — CR DG CHEST 2V
2 series · 2 of 2 positions shown · non-contrast
Comparison: none

[view not recorded (1 of 2)]
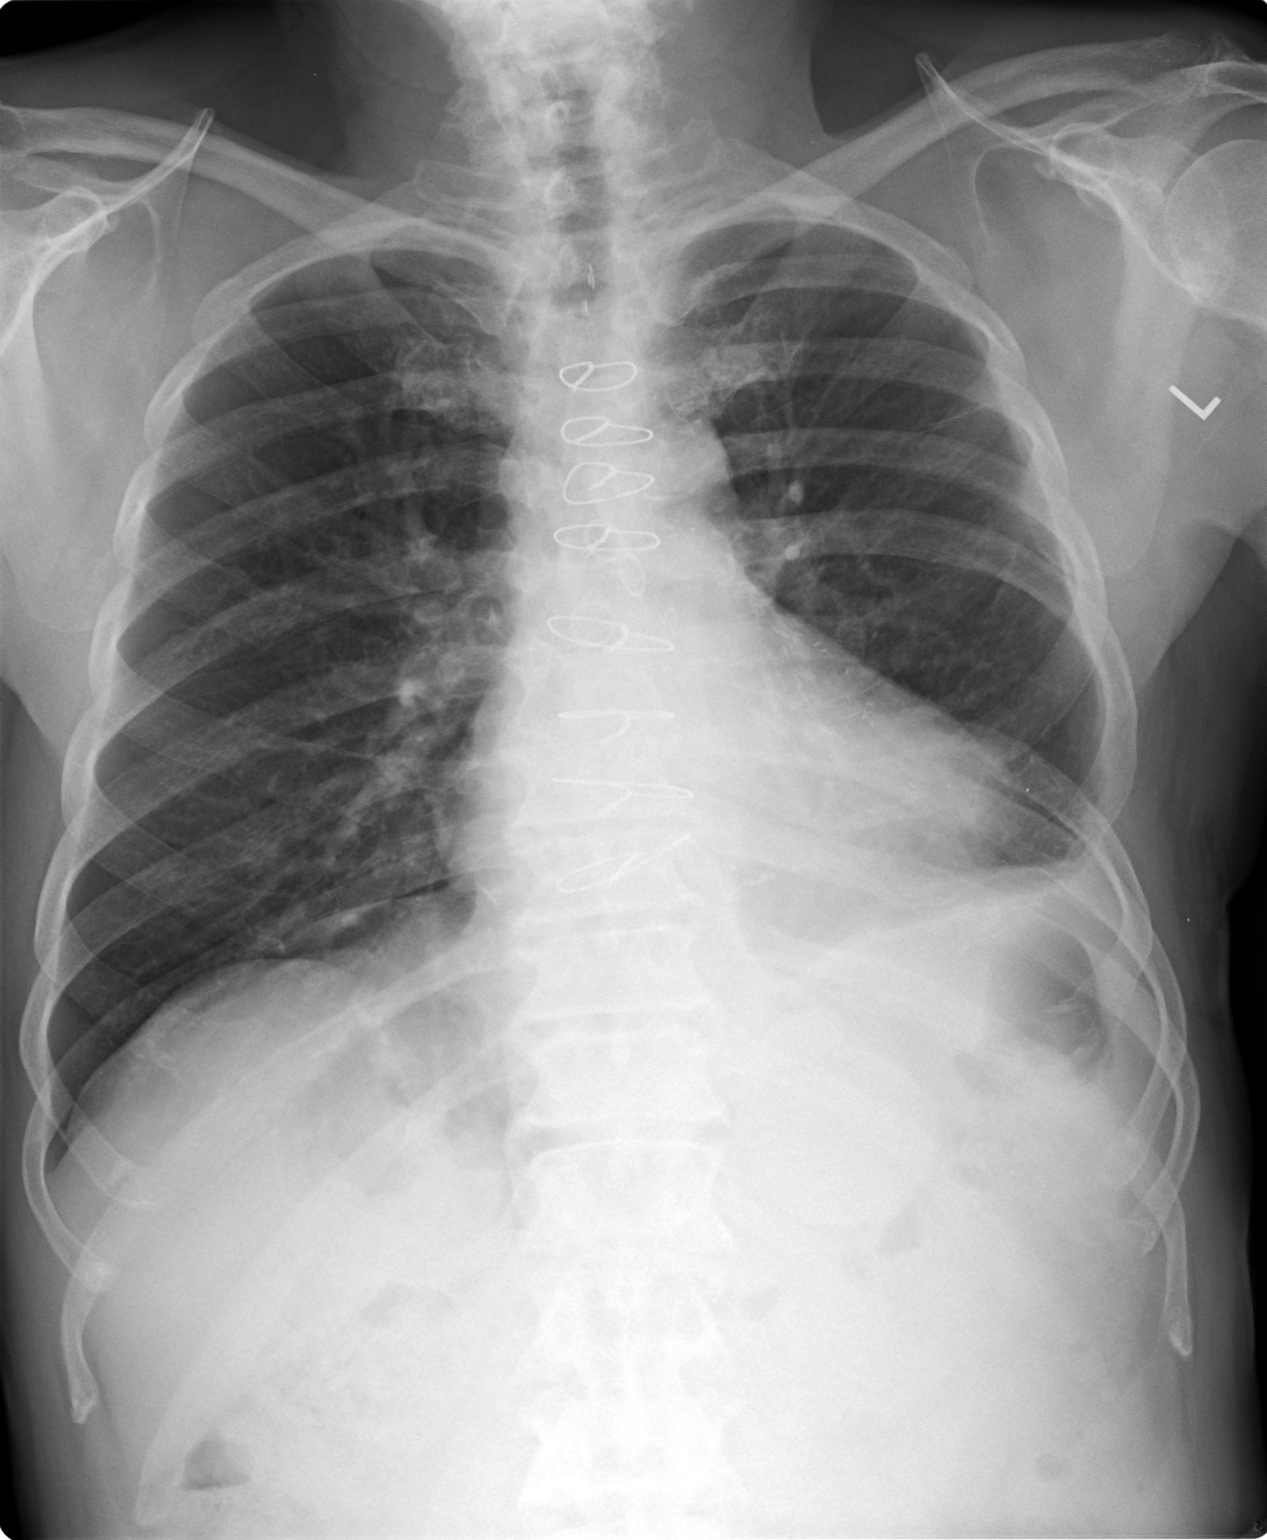

[view not recorded (2 of 2)]
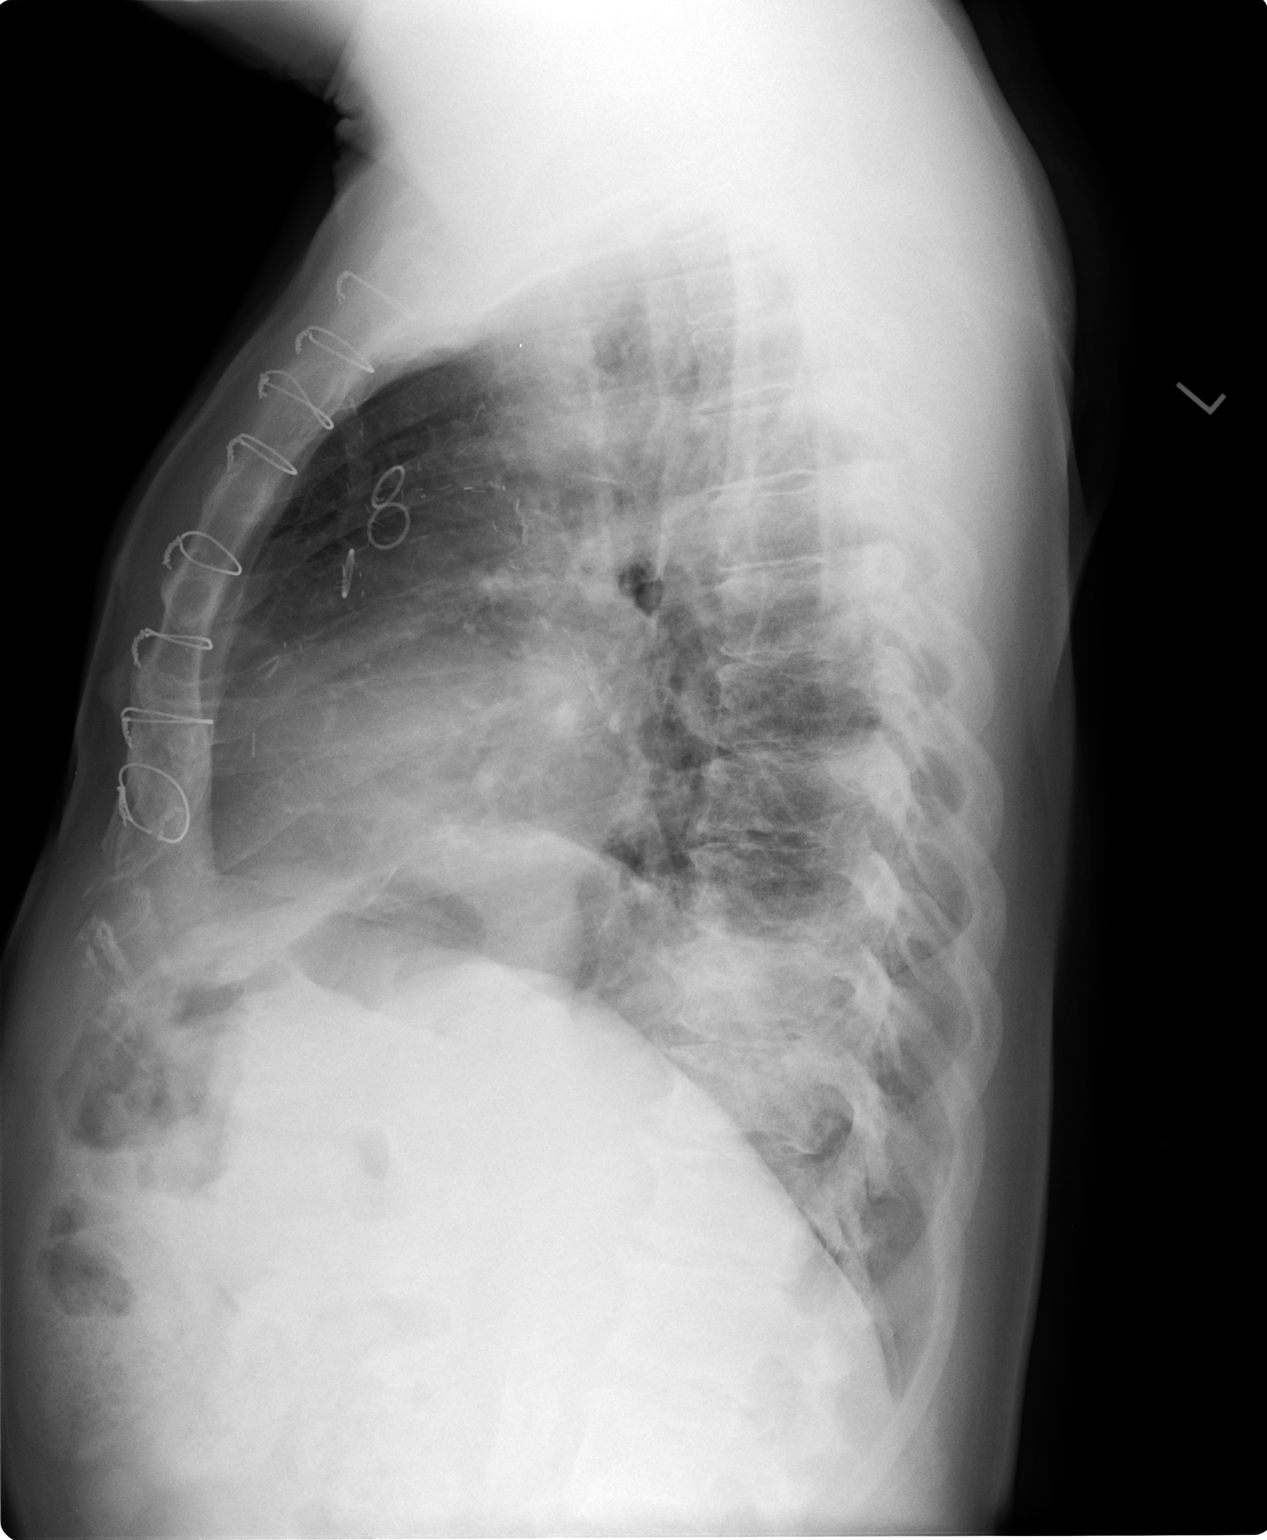

[2 of 2 positions shown; findings below may reference images not displayed]

Canned report from images found in remote index.

Refer to host system for actual result text.

## 2016-01-04 ENCOUNTER — Ambulatory Visit (INDEPENDENT_AMBULATORY_CARE_PROVIDER_SITE_OTHER): Payer: PPO

## 2016-01-04 ENCOUNTER — Other Ambulatory Visit: Payer: PPO

## 2016-01-04 DIAGNOSIS — Z950 Presence of cardiac pacemaker: Secondary | ICD-10-CM

## 2016-01-04 DIAGNOSIS — I5022 Chronic systolic (congestive) heart failure: Secondary | ICD-10-CM | POA: Diagnosis not present

## 2016-01-04 DIAGNOSIS — R600 Localized edema: Secondary | ICD-10-CM

## 2016-01-04 DIAGNOSIS — R609 Edema, unspecified: Secondary | ICD-10-CM | POA: Diagnosis not present

## 2016-01-04 NOTE — Progress Notes (Signed)
EPIC Encounter for ICM Monitoring  Patient Name: Christopher Padilla is a 75 y.o. male Date: 01/04/2016 Primary Care Physican: Rudi Heap, MD Primary Cardiologist: Ladona Ridgel Electrophysiologist: Ladona Ridgel Dry Weight: unknown Bi-V Pacing:  >99%        Received referral for ICM clinic from Dr Randolm Idol nurse, Leotis Shames.   Dr Ladona Ridgel is patients EP and primary cardiologist.  Patient has been having edema and Dr Christell Constant prescribed Furosemide 40 mg 1 tablet daily.  Call to patient and ICM intro given and requested remote transmission to check his current fluid level since he recently started on Furosemide 40 mg due to edema.  Patient is bilateral amputee.  He had large amount of edema in top portion of legs and stomach area.  Since starting Furosemide, edema has resolved.  He is currently taking Furosemide 20 mg daily as prescribed by Dr Christell Constant after receiving lab results.  Received transmission.   Patient agreed to monthly ICM follow up.   Heart Failure questions reviewed, pt asymptomatic at this time.  Thoracic impedance above baseline suggesting dryness which correlates with recently being prescribed Furosemide.  Recommendations: No changes.  Provided education on low salt diet and limit to 2000 mg daily.      Follow-up plan: ICM clinic phone appointment on 03/04/2016.  Copy of ICM check sent to PCP and device physician.   ICM trend: 01/04/2016       Karie Soda, RN 01/04/2016 2:51 PM

## 2016-01-05 LAB — BMP8+EGFR
BUN/Creatinine Ratio: 16 (ref 10–24)
BUN: 27 mg/dL (ref 8–27)
CO2: 25 mmol/L (ref 18–29)
Calcium: 9.4 mg/dL (ref 8.6–10.2)
Chloride: 94 mmol/L — ABNORMAL LOW (ref 96–106)
Creatinine, Ser: 1.71 mg/dL — ABNORMAL HIGH (ref 0.76–1.27)
GFR calc Af Amer: 44 mL/min/{1.73_m2} — ABNORMAL LOW (ref >59)
GFR, EST NON AFRICAN AMERICAN: 38 mL/min/{1.73_m2} — AB (ref >59)
GLUCOSE: 112 mg/dL — AB (ref 65–99)
POTASSIUM: 4.5 mmol/L (ref 3.5–5.2)
SODIUM: 140 mmol/L (ref 134–144)

## 2016-01-18 ENCOUNTER — Other Ambulatory Visit: Payer: Self-pay | Admitting: *Deleted

## 2016-01-18 MED ORDER — INSULIN ASPART 100 UNIT/ML ~~LOC~~ SOLN: SUBCUTANEOUS | 6 refills | Status: DC

## 2016-02-01 ENCOUNTER — Other Ambulatory Visit: Payer: Self-pay | Admitting: *Deleted

## 2016-02-01 MED ORDER — NORTRIPTYLINE HCL 25 MG PO CAPS: ORAL_CAPSULE | ORAL | 0 refills | Status: DC

## 2016-02-01 MED ORDER — GABAPENTIN 600 MG PO TABS: 600.0000 mg | ORAL_TABLET | Freq: Three times a day (TID) | ORAL | 0 refills | Status: DC

## 2016-02-06 ENCOUNTER — Ambulatory Visit (INDEPENDENT_AMBULATORY_CARE_PROVIDER_SITE_OTHER): Payer: PPO

## 2016-02-06 ENCOUNTER — Telehealth: Payer: Self-pay

## 2016-02-06 DIAGNOSIS — Z950 Presence of cardiac pacemaker: Secondary | ICD-10-CM

## 2016-02-06 DIAGNOSIS — I5022 Chronic systolic (congestive) heart failure: Secondary | ICD-10-CM

## 2016-02-06 NOTE — Progress Notes (Signed)
EPIC Encounter for ICM Monitoring  Patient Name: Christopher Padilla is a 75 y.o. male Date: 02/06/2016 Primary Care Physican: Rudi Heap, MD Primary Cardiologist: Ladona Ridgel Electrophysiologist: Ladona Ridgel Dry Weight:    unknown (Patient is bilateral amputee) Bi-V Pacing:  >99%       Attempted ICM call and unable to reach.  Transmission reviewed.   Thoracic impedance normal   Follow-up plan: ICM clinic phone appointment on 03/12/2016.  Copy of ICM check sent to device physician.   ICM trend: 02/06/2016       Karie Soda, RN 02/06/2016 11:39 AM

## 2016-02-06 NOTE — Telephone Encounter (Signed)
Remote ICM transmission received.  Attempted patient call and left detailed message regarding transmission and next ICM scheduled for 03/12/2016.  Advised to return call for any fluid symptoms or questions.

## 2016-02-14 ENCOUNTER — Other Ambulatory Visit: Payer: Self-pay | Admitting: *Deleted

## 2016-02-14 MED ORDER — FUROSEMIDE 40 MG PO TABS: 40.0000 mg | ORAL_TABLET | Freq: Every day | ORAL | 0 refills | Status: DC

## 2016-02-14 MED ORDER — LEVOTHYROXINE SODIUM 125 MCG PO TABS: ORAL_TABLET | ORAL | 2 refills | Status: DC

## 2016-02-14 MED ORDER — GABAPENTIN 600 MG PO TABS: 600.0000 mg | ORAL_TABLET | Freq: Three times a day (TID) | ORAL | 0 refills | Status: DC

## 2016-02-14 MED ORDER — NORTRIPTYLINE HCL 25 MG PO CAPS: ORAL_CAPSULE | ORAL | 0 refills | Status: DC

## 2016-02-23 ENCOUNTER — Other Ambulatory Visit: Payer: Self-pay | Admitting: Family Medicine

## 2016-03-12 ENCOUNTER — Ambulatory Visit (INDEPENDENT_AMBULATORY_CARE_PROVIDER_SITE_OTHER): Payer: PPO | Admitting: *Deleted

## 2016-03-12 DIAGNOSIS — Z950 Presence of cardiac pacemaker: Secondary | ICD-10-CM

## 2016-03-12 DIAGNOSIS — I442 Atrioventricular block, complete: Secondary | ICD-10-CM | POA: Diagnosis not present

## 2016-03-12 DIAGNOSIS — I5022 Chronic systolic (congestive) heart failure: Secondary | ICD-10-CM | POA: Diagnosis not present

## 2016-03-12 NOTE — Progress Notes (Signed)
Remote pacemaker transmission.   

## 2016-03-13 ENCOUNTER — Encounter: Payer: Self-pay | Admitting: Cardiology

## 2016-03-13 NOTE — Progress Notes (Signed)
EPIC Encounter for ICM Monitoring  Patient Name: Christopher Padilla is a 75 y.o. male Date: 03/13/2016 Primary Care Physican: Rudi Heap, MD Primary Cardiologist:Taylor Electrophysiologist: Fuller Mandril Weight:unknown (Patient is bilateral amputee) Bi-V Pacing:  >99%                                                         Heart Failure questions reviewed, pt asymptomatic   Thoracic impedance normal   Recommendations: No changes.  Advised to limit salt intake to 2000 mg daily.  Encouraged to call for fluid symptoms.    Follow-up plan: ICM clinic phone appointment on 04/16/2016.  Copy of ICM check sent to device physician.   ICM trend: 03/12/2016      Karie Soda, RN 03/13/2016 10:37 AM

## 2016-03-20 ENCOUNTER — Other Ambulatory Visit: Payer: Self-pay | Admitting: *Deleted

## 2016-03-20 MED ORDER — GLUCOSE BLOOD VI STRP: ORAL_STRIP | 11 refills | Status: DC

## 2016-04-01 ENCOUNTER — Ambulatory Visit (INDEPENDENT_AMBULATORY_CARE_PROVIDER_SITE_OTHER): Payer: PPO | Admitting: Pharmacist

## 2016-04-01 ENCOUNTER — Encounter: Payer: Self-pay | Admitting: Pharmacist

## 2016-04-01 VITALS — BP 110/58 | HR 72 | Ht 67.75 in | Wt 153.8 lb

## 2016-04-01 DIAGNOSIS — Z23 Encounter for immunization: Secondary | ICD-10-CM

## 2016-04-01 DIAGNOSIS — Z794 Long term (current) use of insulin: Secondary | ICD-10-CM | POA: Diagnosis not present

## 2016-04-01 DIAGNOSIS — IMO0001 Reserved for inherently not codable concepts without codable children: Secondary | ICD-10-CM

## 2016-04-01 DIAGNOSIS — E1139 Type 2 diabetes mellitus with other diabetic ophthalmic complication: Secondary | ICD-10-CM

## 2016-04-01 DIAGNOSIS — Z0000 Encounter for general adult medical examination without abnormal findings: Secondary | ICD-10-CM | POA: Diagnosis not present

## 2016-04-01 LAB — BAYER DCA HB A1C WAIVED: HB A1C (BAYER DCA - WAIVED): 10.1 % — ABNORMAL HIGH (ref <7.0)

## 2016-04-01 MED ORDER — GABAPENTIN 600 MG PO TABS: 600.0000 mg | ORAL_TABLET | Freq: Two times a day (BID) | ORAL | 0 refills | Status: DC

## 2016-04-01 NOTE — Patient Instructions (Addendum)
Christopher Padilla , Thank you for taking time to come for your Medicare Wellness Visit. I appreciate your ongoing commitment to your health goals. Please review the following plan we discussed and let me know if I can assist you in the future.   These are the goals we discussed:  Recommend consider revisiting [employer] for check up on prosthesis.    This is a list of the screening recommended for you and due dates:  Health Maintenance  Topic Date Due  . Flu Shot  12/05/2015  . Urine Protein Check  03/26/2016  . Eye exam for diabetics  05/28/2016  . Hemoglobin A1C  06/08/2016  . DEXA scan (bone density measurement)  07/24/2016  . Complete foot exam   12/05/2016  . Tetanus Vaccine  01/04/2021  . Shingles Vaccine  Completed  . Pneumonia vaccines  Completed   Health Maintenance, Male A healthy lifestyle and preventative care can promote health and wellness.  Maintain regular health, dental, and eye exams.  Eat a healthy diet. Foods like vegetables, fruits, whole grains, low-fat dairy products, and lean protein foods contain the nutrients you need and are low in calories. Decrease your intake of foods high in solid fats, added sugars, and salt. Get information about a proper diet from your health care provider, if necessary.  Regular physical exercise is one of the most important things you can do for your health. Most adults should get at least 150 minutes of moderate-intensity exercise (any activity that increases your heart rate and causes you to sweat) each week. In addition, most adults need muscle-strengthening exercises on 2 or more days a week.   Maintain a healthy weight. The body mass index (BMI) is a screening tool to identify possible weight problems. It provides an estimate of body fat based on height and weight. Your health care provider can find your BMI and can help you achieve or maintain a healthy weight. For males 20 years and older:  A BMI below 18.5 is considered  underweight.  A BMI of 18.5 to 24.9 is normal.  A BMI of 25 to 29.9 is considered overweight.  A BMI of 30 and above is considered obese.  Maintain normal blood lipids and cholesterol by exercising and minimizing your intake of saturated fat. Eat a balanced diet with plenty of fruits and vegetables. Blood tests for lipids and cholesterol should begin at age 40 and be repeated every 5 years. If your lipid or cholesterol levels are high, you are over age 55, or you are at high risk for heart disease, you may need your cholesterol levels checked more frequently.Ongoing high lipid and cholesterol levels should be treated with medicines if diet and exercise are not working.  If you smoke, find out from your health care provider how to quit. If you do not use tobacco, do not start.  Lung cancer screening is recommended for adults aged 55-80 years who are at high risk for developing lung cancer because of a history of smoking. A yearly low-dose CT scan of the lungs is recommended for people who have at least a 30-pack-year history of smoking and are current smokers or have quit within the past 15 years. A pack year of smoking is smoking an average of 1 pack of cigarettes a day for 1 year (for example, a 30-pack-year history of smoking could mean smoking 1 pack a day for 30 years or 2 packs a day for 15 years). Yearly screening should continue until the smoker has stopped  smoking for at least 15 years. Yearly screening should be stopped for people who develop a health problem that would prevent them from having lung cancer treatment.  If you choose to drink alcohol, do not have more than 2 drinks per day. One drink is considered to be 12 oz (360 mL) of beer, 5 oz (150 mL) of wine, or 1.5 oz (45 mL) of liquor.  Avoid the use of street drugs. Do not share needles with anyone. Ask for help if you need support or instructions about stopping the use of drugs.  High blood pressure causes heart disease and  increases the risk of stroke. High blood pressure is more likely to develop in:  People who have blood pressure in the end of the normal range (100-139/85-89 mm Hg).  People who are overweight or obese.  People who are African American.  If you are 6418-77 years of age, have your blood pressure checked every 3-5 years. If you are 77 years of age or older, have your blood pressure checked every year. You should have your blood pressure measured twice-once when you are at a hospital or clinic, and once when you are not at a hospital or clinic. Record the average of the two measurements. To check your blood pressure when you are not at a hospital or clinic, you can use:  An automated blood pressure machine at a pharmacy.  A home blood pressure monitor.  If you are 6545-77 years old, ask your health care provider if you should take aspirin to prevent heart disease.  Diabetes screening involves taking a blood sample to check your fasting blood sugar level. This should be done once every 3 years after age 77 if you are at a normal weight and without risk factors for diabetes. Testing should be considered at a younger age or be carried out more frequently if you are overweight and have at least 1 risk factor for diabetes.  Colorectal cancer can be detected and often prevented. Most routine colorectal cancer screening begins at the age of 77 and continues through age 375. However, your health care provider may recommend screening at an earlier age if you have risk factors for colon cancer. On a yearly basis, your health care provider may provide home test kits to check for hidden blood in the stool. A small camera at the end of a tube may be used to directly examine the colon (sigmoidoscopy or colonoscopy) to detect the earliest forms of colorectal cancer. Talk to your health care provider about this at age 77 when routine screening begins. A direct exam of the colon should be repeated every 5-10 years through  age 77, unless early forms of precancerous polyps or small growths are found.  People who are at an increased risk for hepatitis B should be screened for this virus. You are considered at high risk for hepatitis B if:  You were born in a country where hepatitis B occurs often. Talk with your health care provider about which countries are considered high risk.  Your parents were born in a high-risk country and you have not received a shot to protect against hepatitis B (hepatitis B vaccine).  You have HIV or AIDS.  You use needles to inject street drugs.  You live with, or have sex with, someone who has hepatitis B.  You are a man who has sex with other men (MSM).  You get hemodialysis treatment.  You take certain medicines for conditions like cancer, organ transplantation,  and autoimmune conditions.  Hepatitis C blood testing is recommended for all people born from [birth year] through 1967 and any individual with known risk factors for hepatitis C.  Healthy men should no longer receive prostate-specific antigen (PSA) blood tests as part of routine cancer screening. Talk to your health care provider about prostate cancer screening.  Testicular cancer screening is not recommended for adolescents or adult males who have no symptoms. Screening includes self-exam, a health care provider exam, and other screening tests. Consult with your health care provider about any symptoms you have or any concerns you have about testicular cancer.  Practice safe sex. Use condoms and avoid high-risk sexual practices to reduce the spread of sexually transmitted infections (STIs).  You should be screened for STIs, including gonorrhea and chlamydia if:  You are sexually active and are younger than 24 years.  You are older than 24 years, and your health care provider tells you that you are at risk for this type of infection.  Your sexual activity has changed since you were last screened, and you are at an  increased risk for chlamydia or gonorrhea. Ask your health care provider if you are at risk.  If you are at risk of being infected with HIV, it is recommended that you take a prescription medicine daily to prevent HIV infection. This is called pre-exposure prophylaxis (PrEP). You are considered at risk if:  You are a man who has sex with other men (MSM).  You are a heterosexual man who is sexually active with multiple partners.  You take drugs by injection.  You are sexually active with a partner who has HIV.  Talk with your health care provider about whether you are at high risk of being infected with HIV. If you choose to begin PrEP, you should first be tested for HIV. You should then be tested every 3 months for as long as you are taking PrEP.  Use sunscreen. Apply sunscreen liberally and repeatedly throughout the day. You should seek shade when your shadow is shorter than you. Protect yourself by wearing long sleeves, pants, a wide-brimmed hat, and sunglasses year round whenever you are outdoors.  Tell your health care provider of new moles or changes in moles, especially if there is a change in shape or color. Also, tell your health care provider if a mole is larger than the size of a pencil eraser.  A one-time screening for abdominal aortic aneurysm (AAA) and surgical repair of large AAAs by ultrasound is recommended for men aged 65-75 years who are current or former smokers.  Stay current with your vaccines (immunizations). This information is not intended to replace advice given to you by your health care provider. Make sure you discuss any questions you have with your health care provider. Document Released: 10/19/2007 Document Revised: 05/13/2014 Document Reviewed: 01/24/2015 Elsevier Interactive Patient Education  2017 [employer].

## 2016-04-01 NOTE — Progress Notes (Signed)
Patient ID: Christopher Padilla, male   DOB: 07-26-1940, 75 y.o.   MRN: [id number]   Subjective:   Christopher Padilla is a 75 y.o. male who presents for a subsequent Medicare Annual Wellness Visit.  Christopher Padilla is married. He lives in [place], [place].  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  He states that he is enjoying retirement greatly.  Christopher Padilla has insulin dep DM with history of many complication including bilateral BTK amputation.  He uses a cane for walking assistance.  Patient is in Medicare coverage gap.  He applied for assistance with [facility] Dept's PAP but did not qualify.  They did give him 2 vials of Lantus which he said would last until the end of this year when coverage restarts.    Current Medications (verified) Outpatient Encounter Prescriptions as of 04/01/2016  Medication Sig  . ACCU-CHEK SOFTCLIX LANCETS lancets Check BG 4Xdaily DX E11.65  . aspirin 81 MG tablet Take 81 mg by mouth daily.    . Blood Glucose Monitoring Suppl (ACCU-CHEK AVIVA PLUS) W/DEVICE KIT Check BG 4XDaily DX E11.65  . carvedilol (COREG) 25 MG tablet Take 1 tablet (25 mg total) by mouth 2 (two) times daily with a meal.  . Cholecalciferol (VITAMIN D PO) Take 1,000 Units by mouth daily.   . furosemide (LASIX) 40 MG tablet Take 1 tablet (40 mg total) by mouth daily. (Patient taking differently: Take 20-40 mg by mouth daily. Taking 1/2 tablet (20 mg total) daily.)  . gabapentin (NEURONTIN) 600 MG tablet Take 1 tablet (600 mg total) by mouth 2 (two) times daily.  Marland Kitchen glucose blood test strip Check BS QID and PRN  . insulin aspart (NOVOLOG) 100 UNIT/ML injection Inject 0-20 units as directed per sliding scale  . insulin glargine (LANTUS) 100 UNIT/ML injection Inject 32 Units into the skin at bedtime.  . Insulin Syringe-Needle U-100 (BD INSULIN SYRINGE ULTRAFINE) 31G X 15/64" 0.5 ML MISC Use once daily  . levothyroxine (SYNTHROID, LEVOTHROID) 125 MCG tablet TAKE 1 TABLET (125  MCG TOTAL) BY MOUTH DAILY.  . nortriptyline (PAMELOR) 25 MG capsule TAKE 1 CAPSULE (25 MG TOTAL) BY MOUTH AT BEDTIME.  Marland Kitchen omeprazole (PRILOSEC) 20 MG capsule Take 20 mg by mouth every other day.  . simvastatin (ZOCOR) 40 MG tablet TAKE 1 TABLET (40 MG TOTAL) BY MOUTH AT BEDTIME.  Marland Kitchen tetrahydrozoline (EYE DROPS) 0.05 % ophthalmic solution Apply 1-2 drops to eye daily.  . [DISCONTINUED] gabapentin (NEURONTIN) 600 MG tablet Take 1 tablet (600 mg total) by mouth 3 (three) times daily.  . [DISCONTINUED] LANTUS 100 UNIT/ML injection INJECT 4O UNITS SUBCUTANEOUSLY AT BEDTIME  . [DISCONTINUED] Chlorphen-Pseudoephed-APAP (COLD MEDICINE PLUS) 2-30-325 MG CAPS Take 1-2 capsules by mouth daily as needed (for cold and cough). Reported on 10/18/2015  . [DISCONTINUED] COREG CR 10 MG 24 hr capsule TAKE 1 CAPSULE (10 MG TOTAL) BY MOUTH DAILY.  . [DISCONTINUED] gabapentin (NEURONTIN) 600 MG tablet TAKE 1 TABLET THREE TIMES A DAY  . [DISCONTINUED] glucose blood (ACCU-CHEK AVIVA PLUS) test strip Check BG 4xdaily DX Ell.65  . [DISCONTINUED] insulin aspart (NOVOLOG) 100 UNIT/ML injection Inject 0-20 units as directed per sliding scale  . [DISCONTINUED] levothyroxine (SYNTHROID, LEVOTHROID) 125 MCG tablet TAKE 1 TABLET (125 MCG TOTAL) BY MOUTH DAILY.  . [DISCONTINUED] nortriptyline (PAMELOR) 25 MG capsule TAKE 1 CAPSULE (25 MG TOTAL) BY MOUTH AT BEDTIME.  . [DISCONTINUED] omeprazole (PRILOSEC) 20 MG capsule TAKE 1 CAPSULE BY MOUTH DAILY  . [  DISCONTINUED] simvastatin (ZOCOR) 40 MG tablet TAKE 1 TABLET (40 MG TOTAL) BY MOUTH AT BEDTIME.   Facility-Administered Encounter Medications as of 04/01/2016  Medication  . 0.9 %  sodium chloride infusion  . [DISCONTINUED] 0.9 %  sodium chloride infusion  . [DISCONTINUED] 0.9 %  sodium chloride infusion    Allergies (verified) Codeine; Ciprofloxacin; and Nsaids   History: Past Medical History:  Diagnosis Date  . Arthritis    "left hand" (08/21/2015)  . CAD (coronary artery  disease)    S/P CABG, Feb 2003, LIMA to the LAD, left free radial artery to an obtuse marginal, spahenous vein graft to diagonal, saphenous vein graft to RCA with endarterectomy  . Cerebrovascular disease    MRA in 2005 with 75% stenosis in distal right vertebral artery and 75% stenosis greater in distal cervical internal carotid artery on teh right, severe bilateral disease and MRA of the extracranial circulation, high grade stenosis of the distal right internal carotid artery at the junction of the cervical internal carotid artery of the skull base  . CKD stage 3 due to type 2 diabetes mellitus ([place])   . Collapsed lung   . Complete heart block ([place]) 05/14/2014   St. Jude BiV PM (serial number [id number]) pacemaker  . GERD (gastroesophageal reflux disease)   . History of blood transfusion 2002   "after my heart attack"  . History of stomach ulcers 1960s  . History of tobacco use    Quit in 1990  . Hyperlipidemia   . Hypertension   . Hypothyroidism   . Myocardial infarction 2002  . Osteopenia 2016  . Peripheral vascular disease ([place])    left hand  . PONV (postoperative nausea and vomiting)   . Presence of permanent cardiac pacemaker   . Type II diabetes mellitus ([place])    Past Surgical History:  Procedure Laterality Date  . AMPUTATION  10/17/2011   Procedure: AMPUTATION DIGIT;  Surgeon: Newt Minion, MD;  Location: [facility];  Service: Orthopedics;  Laterality: Left;  Amputation Left Index Finger at PIP Joint  . ARTERIAL BYPASS SURGRY     Left; left femoral to posterior tibial bypass gracting, left femoral artery and deep femoral artery endarterectomy with vein patch angioplasty of the common femoral arter and deep femoral artery 06/2005, right fremoral popliteal bypass, right iliac artery restent, bilateral below knee amputations  . BI-VENTRICULAR PACEMAKER INSERTION N/A 05/16/2014   Procedure: BI-VENTRICULAR PACEMAKER INSERTION (CRT-P);  Surgeon: Evans Lance, MD; St. Jude BiV PM (serial  number [id number]) pacemaker  . CARDIAC CATHETERIZATION    . CARPAL TUNNEL RELEASE Right   . CATARACT EXTRACTION W/ INTRAOCULAR LENS  IMPLANT, BILATERAL Bilateral   . CORONARY ARTERY BYPASS GRAFT  2002   CABG X "4", By Ivin Poot, MD  . ENDARTERECTOMY Left 09/07/2015   Procedure: ENDARTERECTOMY LEFT  BRACHIAL ARTERY ;  Surgeon: Angelia Mould, MD;  Location: [facility];  Service: Vascular;  Laterality: Left;  . INSERT / REPLACE / REMOVE PACEMAKER    . LEFT HEART CATHETERIZATION WITH CORONARY ANGIOGRAM N/A 05/10/2014   Procedure: LEFT HEART CATHETERIZATION WITH CORONARY ANGIOGRAM;  Surgeon: Burnell Blanks, MD;  Location: [facility] CATH LAB;  Service: Cardiovascular;  Laterality: N/A;  . LEG AMPUTATION BELOW KNEE Bilateral 2001-2004  . PATCH ANGIOPLASTY Left 09/07/2015   Procedure: LEFT BRACHIAL ARTERY PATCH ANGIOPLASTY USING XENOSURE BIOLOGIC PATCH;  Surgeon: Angelia Mould, MD;  Location: [facility];  Service: Vascular;  Laterality: Left;  . PERIPHERAL VASCULAR CATHETERIZATION N/A  08/21/2015   Procedure: Upper Extremity Angiography;  Surgeon: Angelia Mould, MD;  Location: [facility] CV LAB;  Service: Cardiovascular;  Laterality: N/A;  . PERIPHERAL VASCULAR CATHETERIZATION N/A 08/21/2015   Procedure: Aortic Arch Angiography;  Surgeon: Angelia Mould, MD;  Location: [facility] CV LAB;  Service: Cardiovascular;  Laterality: N/A;  . TEMPORARY PACEMAKER INSERTION N/A 05/14/2014   Procedure: TEMPORARY PACEMAKER INSERTION;  Surgeon: Blane Ohara, MD;  Location: [facility] CATH LAB;  Service: Cardiovascular;  Laterality: N/A;  . THORACOTOMY     With drainage of a hemathroax and decortication of fibrothorax   Family History  Problem Relation Age of Onset  . Cancer Mother     brain and lung  . Diabetes Father 75  . Coronary artery disease Father   . Diabetes Sister   . Diabetes Brother   . Diabetes Son   . Colon cancer Neg Hx    Social History   Occupational History  . Retired      Social History Main Topics  . Smoking status: Former Smoker    Packs/day: 1.00    Types: Cigarettes    Start date: 11/04/1954    Quit date: 05/06/1988  . Smokeless tobacco: Never Used  . Alcohol use No  . Drug use: No  . Sexual activity: Not Currently    Do you feel safe at home?  Yes  Dietary issues and exercise activities discussed: Current Exercise Habits: The patient does not participate in regular exercise at present, Exercise limited by: orthopedic condition(s)  Current Dietary habits:  Following CHO counting diet   Cardiac Risk Factors include: advanced age (>55mn, >>80women);diabetes mellitus;dyslipidemia;sedentary lifestyle;male gender  Objective:    Today's Vitals   04/01/16 0911  BP: (!) 110/58  Pulse: 72  Weight: 153 lb 12 oz (69.7 kg)  Height: 5' 7.75" (1.721 m)   Body mass index is 23.55 kg/m.   Activities of Daily Living In your present state of health, do you have any difficulty performing the following activities: 04/01/2016 08/21/2015  Hearing? N -  Vision? N -  Difficulty concentrating or making decisions? N -  Walking or climbing stairs? Y -  Dressing or bathing? N -  Doing errands, shopping? N N  Preparing Food and eating ? N -  Using the Toilet? N -  In the past six months, have you accidently leaked urine? N -  Do you have problems with loss of bowel control? N -  Managing your Medications? N -  Managing your Finances? N -  Housekeeping or managing your Housekeeping? N -  Some recent data might be hidden     Depression Screen PHQ 2/9 Scores 04/01/2016 12/20/2015 12/06/2015 10/31/2015  PHQ - 2 Score 0 5 4 0  PHQ- 9 Score - 15 11 -     Fall Risk Fall Risk  04/01/2016 12/20/2015 12/06/2015 10/31/2015 07/31/2015  Falls in the past year? Yes Yes Yes No No  Number falls in past yr: 2 or more 2 or more 1 - -  Injury with Fall? No No No - -  Risk Factor Category  High Fall Risk - - - -  Risk for fall due to : History of fall(s);Impaired  balance/gait;Impaired mobility - - - -  Follow up Falls prevention discussed - - - -    Cognitive Function: MMSE - Mini Mental State Exam 03/27/2015 03/27/2015  Orientation to time 5 5  Orientation to Place 5 5  Registration 3 -  Attention/ Calculation  5 5  Recall 2 -  Language- name 2 objects 2 -  Language- repeat 1 -  Language- follow 3 step command 3 -  Language- read & follow direction 1 -  Write a sentence 1 -  Copy design 1 -  Total score 29 -    Immunizations and Health Maintenance Immunization History  Administered Date(s) Administered  . Influenza, High Dose Seasonal PF 04/01/2016  . Influenza,inj,Quad PF,36+ Mos 01/26/2013, 02/14/2014, 03/13/2015  . Pneumococcal Conjugate-13 05/03/2013  . Pneumococcal Polysaccharide-23 11/03/2009  . Zoster 08/30/2013   Health Maintenance Due  Topic Date Due  . URINE MICROALBUMIN  03/26/2016    Patient Care Team: Chipper Herb, MD as PCP - General (Family Medicine) Evans Lance, MD as Consulting Physician (Cardiology) Sherren Mocha, MD as Consulting Physician (Cardiology) Newt Minion, MD as Consulting Physician (Orthopedic Surgery)  Indicate any recent Medical Services you may have received from other than [facility] providers in the past year (date may be approximate).    Assessment:    Annual Wellness Visit    Screening Tests Health Maintenance  Topic Date Due  . URINE MICROALBUMIN  03/26/2016  . OPHTHALMOLOGY EXAM  05/28/2016  . HEMOGLOBIN A1C  06/08/2016  . DEXA SCAN  07/24/2016  . FOOT EXAM  12/05/2016  . TETANUS/TDAP  01/04/2021  . INFLUENZA VACCINE  Completed  . ZOSTAVAX  Completed  . PNA vac Low Risk Adult  Completed        Plan:   During the course of the visit Yonathan was educated and counseled about the following appropriate screening and preventive services:   Vaccines to include Pneumoccal, Influenza,  Td, Zostavax - received influenza vaccine today in office  Colorectal cancer screening -  UTD  Cardiovascular disease screening - UTD  BP is at goal   Diabetes - will discuss CGM with his PCP and get ins approval is needed  Bone Denisty / Osteoporosis Screening - UTD  Glaucoma screening / Diabetic Eye Exam - UTD  Prostate cancer screening  Advanced Directives - information given and discussed  Physical Activity - recommend activity as able  Recommended he see [employer] to have check up on prosthesis and get proper fit.    Orders Placed This Encounter  Procedures  . Flu vaccine HIGH DOSE PF  . Bayer DCA Hb A1c Waived  . Microalbumin / creatinine urine ratio  . CMP14+EGFR    Patient Instructions (the written plan) were given to the patient.   Cherre Robins, PharmD   04/01/2016

## 2016-04-02 LAB — CMP14+EGFR
A/G RATIO: 1 — AB (ref 1.2–2.2)
ALBUMIN: 3.8 g/dL (ref 3.5–4.8)
ALT: 23 IU/L (ref 0–44)
AST: 27 IU/L (ref 0–40)
Alkaline Phosphatase: 214 IU/L — ABNORMAL HIGH (ref 39–117)
BUN / CREAT RATIO: 18 (ref 10–24)
BUN: 28 mg/dL — ABNORMAL HIGH (ref 8–27)
Bilirubin Total: 0.6 mg/dL (ref 0.0–1.2)
CHLORIDE: 96 mmol/L (ref 96–106)
CO2: 28 mmol/L (ref 18–29)
Calcium: 9.4 mg/dL (ref 8.6–10.2)
Creatinine, Ser: 1.56 mg/dL — ABNORMAL HIGH (ref 0.76–1.27)
GFR calc non Af Amer: 42 mL/min/{1.73_m2} — ABNORMAL LOW (ref >59)
GFR, EST AFRICAN AMERICAN: 49 mL/min/{1.73_m2} — AB (ref >59)
Globulin, Total: 3.7 g/dL (ref 1.5–4.5)
Glucose: 180 mg/dL — ABNORMAL HIGH (ref 65–99)
POTASSIUM: 4.4 mmol/L (ref 3.5–5.2)
Sodium: 138 mmol/L (ref 134–144)
TOTAL PROTEIN: 7.5 g/dL (ref 6.0–8.5)

## 2016-04-02 LAB — MICROALBUMIN / CREATININE URINE RATIO
Creatinine, Urine: 36.1 mg/dL
Microalb/Creat Ratio: 35.7 mg/g creat — ABNORMAL HIGH (ref 0.0–30.0)
Microalbumin, Urine: 12.9 ug/mL

## 2016-04-09 LAB — CUP PACEART REMOTE DEVICE CHECK
Battery Remaining Longevity: 86 mo
Battery Remaining Percentage: 95.5 %
Date Time Interrogation Session: 20171107092224
Implantable Lead Implant Date: 20160111
Implantable Lead Implant Date: 20160111
Implantable Lead Implant Date: 20160111
Implantable Lead Location: 753857
Implantable Lead Model: 4196
Implantable Pulse Generator Implant Date: 20160111
Lead Channel Pacing Threshold Pulse Width: 0.4 ms
Lead Channel Pacing Threshold Pulse Width: 0.5 ms
Lead Channel Sensing Intrinsic Amplitude: 4.9 mV
Lead Channel Setting Pacing Amplitude: 2 V
Lead Channel Setting Pacing Amplitude: 2 V
Lead Channel Setting Pacing Pulse Width: 0.5 ms
MDC IDC LEAD LOCATION: 753859
MDC IDC LEAD LOCATION: 753860
MDC IDC MSMT BATTERY VOLTAGE: 2.98 V
MDC IDC MSMT LEADCHNL LV IMPEDANCE VALUE: 360 Ohm
MDC IDC MSMT LEADCHNL LV PACING THRESHOLD AMPLITUDE: 1.75 V
MDC IDC MSMT LEADCHNL RA IMPEDANCE VALUE: 360 Ohm
MDC IDC MSMT LEADCHNL RA PACING THRESHOLD AMPLITUDE: 1 V
MDC IDC MSMT LEADCHNL RA PACING THRESHOLD PULSEWIDTH: 0.4 ms
MDC IDC MSMT LEADCHNL RV IMPEDANCE VALUE: 440 Ohm
MDC IDC MSMT LEADCHNL RV PACING THRESHOLD AMPLITUDE: 0.75 V
MDC IDC MSMT LEADCHNL RV SENSING INTR AMPL: 12 mV
MDC IDC SET LEADCHNL LV PACING AMPLITUDE: 2.5 V
MDC IDC SET LEADCHNL RV PACING PULSEWIDTH: 0.4 ms
MDC IDC SET LEADCHNL RV SENSING SENSITIVITY: 4 mV
MDC IDC STAT BRADY AP VP PERCENT: 35 %
MDC IDC STAT BRADY AP VS PERCENT: 1 %
MDC IDC STAT BRADY AS VP PERCENT: 64 %
MDC IDC STAT BRADY AS VS PERCENT: 1 %
MDC IDC STAT BRADY RA PERCENT PACED: 35 %
Pulse Gen Model: 3222
Pulse Gen Serial Number: 7714017

## 2016-04-16 ENCOUNTER — Ambulatory Visit (INDEPENDENT_AMBULATORY_CARE_PROVIDER_SITE_OTHER): Payer: PPO

## 2016-04-16 DIAGNOSIS — I5022 Chronic systolic (congestive) heart failure: Secondary | ICD-10-CM

## 2016-04-16 DIAGNOSIS — Z950 Presence of cardiac pacemaker: Secondary | ICD-10-CM

## 2016-04-17 ENCOUNTER — Encounter: Payer: Self-pay | Admitting: Pharmacist

## 2016-04-17 ENCOUNTER — Ambulatory Visit (INDEPENDENT_AMBULATORY_CARE_PROVIDER_SITE_OTHER): Payer: PPO | Admitting: Pharmacist

## 2016-04-17 VITALS — BP 115/60 | HR 62

## 2016-04-17 DIAGNOSIS — Z794 Long term (current) use of insulin: Secondary | ICD-10-CM | POA: Diagnosis not present

## 2016-04-17 DIAGNOSIS — E1139 Type 2 diabetes mellitus with other diabetic ophthalmic complication: Secondary | ICD-10-CM | POA: Diagnosis not present

## 2016-04-17 DIAGNOSIS — IMO0001 Reserved for inherently not codable concepts without codable children: Secondary | ICD-10-CM

## 2016-04-17 DIAGNOSIS — Z79899 Other long term (current) drug therapy: Secondary | ICD-10-CM | POA: Diagnosis not present

## 2016-04-17 NOTE — Progress Notes (Signed)
Subjective:    Christopher Padilla is a 75 y.o. male  Referred by his PCP Dr Rudi Heaponald Moore for placement of Continuous Glucose Monitor.  Christopher Padilla has insulin dependent DM which currently is uncontrolled.  He has BKA bilaterally.  Additionally he has been evaluated recently had left branchial artery endarterectomy.    The patient was initially diagnosed with Type 2 diabetes mellitus about 36 years ago (at 75 yo).    Known diabetic complications: retinopathy, peripheral neuropathy, cardiovascular disease and amplutations - BKA  - bilaterally and pointer finger of left hand Cardiovascular risk factors: advanced age (older than 1055 for men, 5965 for women), diabetes mellitus, dyslipidemia, hypertension and male gender Current diabetic medications include Lantus 32 units at bedtime and Humalog per below   1 serving of carbohydrate containing food 2 or 3 servings of carbohydrate containing foods 4 or more serving of carbohydrate containing foods  5 units of humalog insulin 7 unit of humalog insulin 9 units of humalog insulin     Blood glucose  Amount of Humalog insulin  Less than 100 none  101 to 120 2 units  121 to 140 4 units  141 to 160 6 units  161 to 180 7 units  181 to 200 8 units  201 or over 10 units        Current monitoring regimen: home blood tests - 1-2  times daily  - patient only checks left hand due to decreased dexterity of left hand (loss of pointer finger on left hand).  Any episodes of hypoglycemia? 1 or 2 times per month  Is He on ACE inhibitor or angiotensin II receptor blocker?  No ramipril was discontinued around 04/2014 - possibly related to increase serum creatinine which has since decreased and is currently stable.   The following portions of the patient's history were reviewed and updated as appropriate: allergies, current medications, past family history, past medical history, past social history, past surgical history and  problem list.   Objective:    BP 115/60   Pulse 62   Lab Review Glucose (mg/dL)  Date Value  16/10/960411/27/2017 180 (H)  01/04/2016 112 (H)  12/20/2015 243 (H)   Glucose, Bld (mg/dL)  Date Value  54/09/811905/09/2015 301 (H)  09/07/2015 141 (H)  08/21/2015 207 (H)   CO2 (mmol/L)  Date Value  04/01/2016 28  01/04/2016 25  12/20/2015 27   BUN (mg/dL)  Date Value  14/78/295611/27/2017 28 (H)  01/04/2016 27  12/20/2015 20   Creat (mg/dL)  Date Value  21/30/865705/27/2014 1.22   Creatinine, Ser (mg/dL)  Date Value  84/69/629511/27/2017 1.56 (H)  01/04/2016 1.71 (H)  12/20/2015 1.50 (H)      Assessment:    Diabetes Mellitus type II, under inadequate control.   HTN - controlled Medication managment  Plan:    1.  Rx changes:   Continue Lantus to 32 units each morning  Continue Novolog per sliding scale 2.  Continuous Glucose monitor was placed today to try to get better understanding of BG trends.  Patient educated about proper care of CGM and site.  CGM was placed on back of upper left arm.  Patient will wear at least 5 days and up to 14 days.  He is to keep BG, diet and insulin administration records. He can engage in regular activities with special care when showering, changing clothes and swimming (only up to 3 feet and for 30 minutes at a time) 3.  RTC in 14 days to see PCP  and have CGM removed. 4.  Checking CBC and B12 since patient has history of neuropathy - r/o B 12 def anemia  Henrene Pastor, PharmD, CPP, CDE

## 2016-04-18 LAB — CBC WITH DIFFERENTIAL/PLATELET
BASOS ABS: 0 10*3/uL (ref 0.0–0.2)
BASOS: 0 %
EOS (ABSOLUTE): 0.1 10*3/uL (ref 0.0–0.4)
Eos: 2 %
Hematocrit: 39.7 % (ref 37.5–51.0)
Hemoglobin: 12.8 g/dL — ABNORMAL LOW (ref 13.0–17.7)
IMMATURE GRANS (ABS): 0 10*3/uL (ref 0.0–0.1)
IMMATURE GRANULOCYTES: 0 %
LYMPHS: 31 %
Lymphocytes Absolute: 1.7 10*3/uL (ref 0.7–3.1)
MCH: 30.1 pg (ref 26.6–33.0)
MCHC: 32.2 g/dL (ref 31.5–35.7)
MCV: 93 fL (ref 79–97)
Monocytes Absolute: 0.5 10*3/uL (ref 0.1–0.9)
Monocytes: 9 %
NEUTROS PCT: 58 %
Neutrophils Absolute: 3.1 10*3/uL (ref 1.4–7.0)
PLATELETS: 149 10*3/uL — AB (ref 150–379)
RBC: 4.25 x10E6/uL (ref 4.14–5.80)
RDW: 14.5 % (ref 12.3–15.4)
WBC: 5.4 10*3/uL (ref 3.4–10.8)

## 2016-04-18 LAB — VITAMIN B12: VITAMIN B 12: 395 pg/mL (ref 232–1245)

## 2016-04-18 NOTE — Progress Notes (Signed)
EPIC Encounter for ICM Monitoring  Patient Name: Christopher Padilla is a 75 y.o. male Date: 04/18/2016 Primary Care Physican: Redge Gainer, MD Primary Cardiologist:Taylor Electrophysiologist: Druscilla Brownie Weight:unknown (Patient is bilateral amputee) Bi-V Pacing: >99%      Heart Failure questions reviewed, pt symptomatic with swelling in upper thighs  Thoracic impedance abnormal suggesting fluid accumulation since 04/05/2016.  He has not taken any Furosemide in last 2 days  Labs: 04/01/2016 Creatinine 1.56, BUN 28, Potassium 4.4, Sodium 138, EGFR 42-49  01/04/2016 Creatinine 1.71, BUN 27, Potassium 4.5, Sodium 140, EGFR 38-44   12/20/2015 Creatinine 1.50, BUN 20, Potassium 4.8, Sodium 137, EGFR 44-51   12/07/2015 Creatinine 1.21, BUN 16, Potassium 4.9, Sodium 139, EGFR 57-66   10/31/2015 Creatinine 1.45, BUN 22, Potassium 5.8, Sodium 135, EGFR 46-54   09/08/2015 Creatinine 1.69, BUN 22, Potassium 4.8, Sodium 134, EGFR 38-44   08/21/2015 Creatinine 1.46, BUN 18, Potassium 5.0, Sodium 139, EGFR 45-52  08/14/2015 Creatinine 1.50, BUN 21, Potassium 4.5, Sodium 137, EGFR 61-71  07/01/2015 Creatinine 1.29, BUN 22, Potassium 4.1, Sodium 140, EGFR 52->60   Recommendations:  Advised to take Furosemide 40 mg 1 tablet daily x 2 days and then return to prior dosage of 1/2 tablet daily.  He verbalized understanding.     Follow-up plan: ICM clinic phone appointment on 04/22/2016.  Copy of ICM check sent to cardiologist/device physician.   ICM trend: 04/16/2016             Rosalene Billings, RN 04/18/2016 11:44 AM

## 2016-04-22 ENCOUNTER — Ambulatory Visit (INDEPENDENT_AMBULATORY_CARE_PROVIDER_SITE_OTHER): Payer: PPO

## 2016-04-22 ENCOUNTER — Telehealth: Payer: Self-pay

## 2016-04-22 DIAGNOSIS — Z950 Presence of cardiac pacemaker: Secondary | ICD-10-CM

## 2016-04-22 DIAGNOSIS — I5022 Chronic systolic (congestive) heart failure: Secondary | ICD-10-CM

## 2016-04-22 NOTE — Telephone Encounter (Signed)
Attempted ICM call and left message requesting to send remote transmission. Left ICM direct number.

## 2016-04-22 NOTE — Progress Notes (Signed)
EPIC Encounter for ICM Monitoring  Patient Name: Christopher Padilla is a 75 y.o. male Date: 04/22/2016 Primary Care Physican: Redge Gainer, MD Primary Cardiologist:Taylor Electrophysiologist: Druscilla Brownie Weight:unknown (Patient is bilateral amputee) Bi-V Pacing: >99%                                            Heart Failure questions reviewed, pt symptoms, swelling in upper thighs and belly have resolved.   Thoracic impedance returned normal since taking Furosemide 40 mg 1 tablet as prescribed for fluid x 2 days.  Patient asked if should stay on higher prescribed dosage of Furosemide.  Explained it is better to decrease salt in diet and to stay on 20 mg daily.    Labs: 04/01/2016 Creatinine 1.56, BUN 28, Potassium 4.4, Sodium 138, EGFR 42-49  01/04/2016 Creatinine 1.71, BUN 27, Potassium 4.5, Sodium 140, EGFR 38-44   12/20/2015 Creatinine 1.50, BUN 20, Potassium 4.8, Sodium 137, EGFR 44-51   12/07/2015 Creatinine 1.21, BUN 16, Potassium 4.9, Sodium 139, EGFR 57-66   10/31/2015 Creatinine 1.45, BUN 22, Potassium 5.8, Sodium 135, EGFR 46-54   09/08/2015 Creatinine 1.69, BUN 22, Potassium 4.8, Sodium 134, EGFR 38-44   08/21/2015 Creatinine 1.46, BUN 18, Potassium 5.0, Sodium 139, EGFR 45-52  08/14/2015 Creatinine 1.50, BUN 21, Potassium 4.5, Sodium 137, EGFR 61-71  07/01/2015 Creatinine 1.29, BUN 22, Potassium 4.1, Sodium 140, EGFR 52->60   Recommendations: No changes.  Patient thinks he follows a low salt diet but is not checking food labels.  Advised to review food labels and explained foods such as cheese are high in sodium.  He stated he likes cheese.  Reinforced to limit low salt food choices to 2000 mg day and limiting fluid intake to < 2 liters per day. Encouraged to call for fluid symptoms.    Follow-up plan: ICM clinic phone appointment on 05/21/2016.  Copy of ICM check sent to device physician.   ICM trend: 04/22/2016       Rosalene Billings,  RN 04/22/2016 3:17 PM

## 2016-04-24 ENCOUNTER — Ambulatory Visit: Payer: PPO | Admitting: Vascular Surgery

## 2016-04-24 ENCOUNTER — Ambulatory Visit (HOSPITAL_COMMUNITY)
Admission: RE | Admit: 2016-04-24 | Discharge: 2016-04-24 | Disposition: A | Discharge status: Home / Self Care | Payer: PPO | Source: Ambulatory Visit | Attending: Vascular Surgery | Admitting: Vascular Surgery

## 2016-04-24 DIAGNOSIS — I708 Atherosclerosis of other arteries: Secondary | ICD-10-CM

## 2016-04-25 ENCOUNTER — Encounter: Payer: Self-pay | Admitting: Vascular Surgery

## 2016-05-01 ENCOUNTER — Encounter: Payer: Self-pay | Admitting: Vascular Surgery

## 2016-05-01 ENCOUNTER — Encounter: Payer: Self-pay | Admitting: Family Medicine

## 2016-05-01 ENCOUNTER — Ambulatory Visit (INDEPENDENT_AMBULATORY_CARE_PROVIDER_SITE_OTHER): Payer: PPO | Admitting: Vascular Surgery

## 2016-05-01 ENCOUNTER — Ambulatory Visit (INDEPENDENT_AMBULATORY_CARE_PROVIDER_SITE_OTHER): Payer: PPO | Admitting: Family Medicine

## 2016-05-01 VITALS — BP 140/65 | HR 62 | Temp 96.6°F | Ht 67.5 in | Wt 154.0 lb

## 2016-05-01 VITALS — BP 148/76 | HR 62 | Temp 97.4°F | Resp 16 | Ht 68.0 in | Wt 155.0 lb

## 2016-05-01 DIAGNOSIS — E1122 Type 2 diabetes mellitus with diabetic chronic kidney disease: Secondary | ICD-10-CM

## 2016-05-01 DIAGNOSIS — E034 Atrophy of thyroid (acquired): Secondary | ICD-10-CM

## 2016-05-01 DIAGNOSIS — Z48812 Encounter for surgical aftercare following surgery on the circulatory system: Secondary | ICD-10-CM | POA: Diagnosis not present

## 2016-05-01 DIAGNOSIS — E785 Hyperlipidemia, unspecified: Secondary | ICD-10-CM | POA: Diagnosis not present

## 2016-05-01 DIAGNOSIS — E559 Vitamin D deficiency, unspecified: Secondary | ICD-10-CM

## 2016-05-01 DIAGNOSIS — I442 Atrioventricular block, complete: Secondary | ICD-10-CM

## 2016-05-01 DIAGNOSIS — I739 Peripheral vascular disease, unspecified: Secondary | ICD-10-CM | POA: Diagnosis not present

## 2016-05-01 DIAGNOSIS — N183 Chronic kidney disease, stage 3 (moderate): Secondary | ICD-10-CM | POA: Diagnosis not present

## 2016-05-01 DIAGNOSIS — F439 Reaction to severe stress, unspecified: Secondary | ICD-10-CM | POA: Diagnosis not present

## 2016-05-01 DIAGNOSIS — I5042 Chronic combined systolic (congestive) and diastolic (congestive) heart failure: Secondary | ICD-10-CM

## 2016-05-01 DIAGNOSIS — Z89511 Acquired absence of right leg below knee: Secondary | ICD-10-CM

## 2016-05-01 DIAGNOSIS — I10 Essential (primary) hypertension: Secondary | ICD-10-CM

## 2016-05-01 MED ORDER — ACCU-CHEK SOFTCLIX LANCETS MISC: 3 refills | Status: AC

## 2016-05-01 MED ORDER — GABAPENTIN 600 MG PO TABS: 600.0000 mg | ORAL_TABLET | Freq: Two times a day (BID) | ORAL | 3 refills | Status: AC

## 2016-05-01 MED ORDER — INSULIN ASPART 100 UNIT/ML ~~LOC~~ SOLN
SUBCUTANEOUS | 11 refills | Status: DC
Start: 2016-05-01 — End: 2016-07-02

## 2016-05-01 MED ORDER — GLUCOSE BLOOD VI STRP: ORAL_STRIP | 3 refills | Status: AC

## 2016-05-01 MED ORDER — OMEPRAZOLE 20 MG PO CPDR: 20.0000 mg | DELAYED_RELEASE_CAPSULE | ORAL | 3 refills | Status: AC

## 2016-05-01 MED ORDER — SIMVASTATIN 40 MG PO TABS: ORAL_TABLET | ORAL | 3 refills | Status: AC

## 2016-05-01 MED ORDER — LEVOTHYROXINE SODIUM 125 MCG PO TABS: ORAL_TABLET | ORAL | 3 refills | Status: AC

## 2016-05-01 MED ORDER — CARVEDILOL 25 MG PO TABS: 25.0000 mg | ORAL_TABLET | Freq: Two times a day (BID) | ORAL | 3 refills | Status: AC

## 2016-05-01 MED ORDER — NORTRIPTYLINE HCL 25 MG PO CAPS: ORAL_CAPSULE | ORAL | 3 refills | Status: AC

## 2016-05-01 MED ORDER — FUROSEMIDE 40 MG PO TABS: 40.0000 mg | ORAL_TABLET | Freq: Every day | ORAL | 3 refills | Status: AC

## 2016-05-01 MED ORDER — "INSULIN SYRINGE-NEEDLE U-100 31G X 15/64"" 0.5 ML MISC": 3 refills | Status: DC

## 2016-05-01 MED ORDER — INSULIN GLARGINE 100 UNIT/ML ~~LOC~~ SOLN: 32.0000 [IU] | Freq: Every day | SUBCUTANEOUS | 3 refills | Status: DC

## 2016-05-01 NOTE — Progress Notes (Signed)
Patient name: Christopher Padilla MRN: [id number] DOB: Dec 24, 1940 Sex: male  REASON FOR VISIT: Routine follow up.  HPI: Christopher Padilla is a 75 y.o. male who I last saw on 10/18/2015. He had undergone a left brachial artery endarterectomy. He had presented with progressive ischemia of the left upper extremity. His main issue after this procedure was swelling and I did have to aspirate a lymphocele. I ordered a follow up duplex scan in 6 months and he returns for his follow up visit.  He has had some occasional pain in his right arm. He is examined at [facility] with bilateral prostheses. He has bilateral BKA's. He has had some neuropathic pain in his stumps.  He is on aspirin and is on a statin.  Past Medical History:  Diagnosis Date  . Arthritis    "left hand" (08/21/2015)  . CAD (coronary artery disease)    S/P CABG, Feb 2003, LIMA to the LAD, left free radial artery to an obtuse marginal, spahenous vein graft to diagonal, saphenous vein graft to RCA with endarterectomy  . Cerebrovascular disease    MRA in 2005 with 75% stenosis in distal right vertebral artery and 75% stenosis greater in distal cervical internal carotid artery on teh right, severe bilateral disease and MRA of the extracranial circulation, high grade stenosis of the distal right internal carotid artery at the junction of the cervical internal carotid artery of the skull base  . CKD stage 3 due to type 2 diabetes mellitus ([place])   . Collapsed lung   . Complete heart block ([place]) 05/14/2014   St. Jude BiV PM (serial number [id number]) pacemaker  . GERD (gastroesophageal reflux disease)   . History of blood transfusion 2002   "after my heart attack"  . History of stomach ulcers 1960s  . History of tobacco use    Quit in 1990  . Hyperlipidemia   . Hypertension   . Hypothyroidism   . Myocardial infarction 2002  . Osteopenia 2016  . Peripheral vascular disease ([place])    left hand  . PONV (postoperative nausea and vomiting)   .  Presence of permanent cardiac pacemaker   . Type II diabetes mellitus (HCC)     Family History  Problem Relation Age of Onset  . Cancer Mother     brain and lung  . Diabetes Father 43  . Coronary artery disease Father   . Diabetes Sister   . Diabetes Brother   . Diabetes Son   . Colon cancer Neg Hx     SOCIAL HISTORY: Social History  Substance Use Topics  . Smoking status: Former Smoker    Packs/day: 1.00    Types: Cigarettes    Start date: 11/04/1954    Quit date: 05/06/1988  . Smokeless tobacco: Never Used  . Alcohol use No    Allergies  Allergen Reactions  . Codeine Other (See Comments)    hallucinations  . Ciprofloxacin Diarrhea  . Nsaids Other (See Comments)    GI upset    Current Outpatient Prescriptions  Medication Sig Dispense Refill  . ACCU-CHEK SOFTCLIX LANCETS lancets Check BG 4Xdaily DX E11.65 300 each 3  . aspirin 81 MG tablet Take 81 mg by mouth daily.      . Blood Glucose Monitoring Suppl (ACCU-CHEK AVIVA PLUS) W/DEVICE KIT Check BG 4XDaily DX E11.65 1 kit 0  . carvedilol (COREG) 25 MG tablet Take 1 tablet (25 mg total) by mouth 2 (two) times daily with a meal. 180 tablet 3  .  Cholecalciferol (VITAMIN D PO) Take 1,000 Units by mouth daily.     . furosemide (LASIX) 40 MG tablet Take 1 tablet (40 mg total) by mouth daily. 90 tablet 3  . gabapentin (NEURONTIN) 600 MG tablet Take 1 tablet (600 mg total) by mouth 2 (two) times daily. 180 tablet 3  . glucose blood test strip Check BS QID and PRN 300 each 3  . insulin aspart (NOVOLOG) 100 UNIT/ML injection Inject 0-20 units as directed per sliding scale 30 mL 11  . insulin glargine (LANTUS) 100 UNIT/ML injection Inject 0.32 mLs (32 Units total) into the skin at bedtime. 30 mL 3  . Insulin Syringe-Needle U-100 (BD INSULIN SYRINGE ULTRAFINE) 31G X 15/64" 0.5 ML MISC Use once daily 300 each 3  . levothyroxine (SYNTHROID, LEVOTHROID) 125 MCG tablet TAKE 1 TABLET (125 MCG TOTAL) BY MOUTH DAILY. 90 tablet 3  .  nortriptyline (PAMELOR) 25 MG capsule TAKE 1 CAPSULE (25 MG TOTAL) BY MOUTH AT BEDTIME. 90 capsule 3  . omeprazole (PRILOSEC) 20 MG capsule Take 1 capsule (20 mg total) by mouth every other day. 45 capsule 3  . simvastatin (ZOCOR) 40 MG tablet TAKE 1 TABLET (40 MG TOTAL) BY MOUTH AT BEDTIME. 90 tablet 3   No current facility-administered medications for this visit.    Facility-Administered Medications Ordered in Other Visits  Medication Dose Route Frequency Provider Last Rate Last Dose  . 0.9 %  sodium chloride infusion   Intravenous Continuous Angelia Mould, MD        REVIEW OF SYSTEMS:  [X]  denotes positive finding, [ ]  denotes negative finding Cardiac  Comments:  Chest pain or chest pressure:    Shortness of breath upon exertion:    Short of breath when lying flat:    Irregular heart rhythm:        Vascular    Pain in calf, thigh, or hip brought on by ambulation:    Pain in feet at night that wakes you up from your sleep:     Blood clot in your veins:    Leg swelling:  X BKA stumps      Pulmonary    Oxygen at home:    Productive cough:     Wheezing:         Neurologic    Sudden weakness in arms or legs:     Sudden numbness in arms or legs:     Sudden onset of difficulty speaking or slurred speech:    Temporary loss of vision in one eye:     Problems with dizziness:         Gastrointestinal    Blood in stool:     Vomited blood:         Genitourinary    Burning when urinating:     Blood in urine:        Psychiatric    Major depression:         Hematologic    Bleeding problems:    Problems with blood clotting too easily:        Skin    Rashes or ulcers: X Bumps left arm      Constitutional    Fever or chills:      PHYSICAL EXAM: Vitals:   05/01/16 1241 05/01/16 1245  BP: (!) 144/72 (!) 148/76  Pulse: 60 62  Resp: 16   Temp: 97.4 F (36.3 C)   TempSrc: Oral   SpO2: 97%   Weight: 155 lb (70.3 kg)  Height: 5' 8"  (1.727 m)     GENERAL: The  patient is a well-nourished male, in no acute distress. The vital signs are documented above. CARDIAC: There is a regular rate and rhythm.  VASCULAR: He has a right carotid bruit. I cannot palpate radial pulses. PULMONARY: There is good air exchange bilaterally without wheezing or rales. ABDOMEN: Soft and non-tender with normal pitched bowel sounds.  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 NEUROLOGIC: No focal weakness or paresthesias are detected. SKIN: There are no ulcers or rashes noted. PSYCHIATRIC: The patient has a normal affect.  DATA:   UPPER EXTREMITY ARTERIAL DOPPLER STUDY: I have independently interpreted his upper extremity arterial Doppler study.  On the left side, he has a triphasic subclavian signal with a biphasic axillary signal. Below that he has monophasic signals. Digital pressure on the left is 67 mmHg.  On the right side, he has a triphasic subclavian, axillary, and brachial signal. He has monophasic Doppler signals below that with a digital pressure of 51 mmHg.  CAROTID DUPLEX: His last carotid duplex scan was in May 2017 at which time he had a 40-59% right carotid stenosis with no significant stenosis on the left.  MEDICAL ISSUES:  STATUS POST LEFT BRACHIAL ENDARTERECTOMY WITH VEIN PATCH: He has minimal symptoms in the left arm is doing well status post endarterectomy with vein patch. He does complain of some symptoms in his right arm but appears to have distal disease and likely does not have any good options for revascularization in the right arm.  CAROTID DISEASE: He has a known 40-59% right carotid stenosis and will be due for a follow up carotid duplex scan in May 2018. He is asymptomatic. He is on aspirin and is on a statin.   Deitra [facility] [phone]
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
## 2016-05-01 NOTE — Patient Instructions (Addendum)
Medicare Annual Wellness Visit  [place] and the medical providers at [facility] strive to bring you the best medical care.  In doing so we not only want to address your current medical conditions and concerns but also to detect new conditions early and prevent illness, disease and health-related problems.    Medicare offers a yearly Wellness Visit which allows our clinical staff to assess your need for preventative services including immunizations, lifestyle education, counseling to decrease risk of preventable diseases and screening for fall risk and other medical concerns.    This visit is provided free of charge (no copay) for all Medicare recipients. The clinical pharmacists at [facility] have begun to conduct these Wellness Visits which will also include a thorough review of all your medications.    As you primary medical provider recommend that you make an appointment for your Annual Wellness Visit if you have not done so already this year.  You may set up this appointment before you leave today or you may call back [phone]) and schedule an appointment.  Please make sure when you call that you mention that you are scheduling your Annual Wellness Visit with the clinical pharmacist so that the appointment may be made for the proper length of time.     Continue current medications. Continue good therapeutic lifestyle changes which include good diet and exercise. Fall precautions discussed with patient. If an FOBT was given today- please return it to our front desk. If you are over 42 years old - you may need Prevnar 13 or the adult Pneumonia vaccine.  **Flu shots are available--- please call and schedule a FLU-CLINIC appointment**  After your visit with [place] today you will receive a survey in the mail or online from [employer] regarding your care with [place]. Please take a moment to fill this out. Your feedback is very  important to [place] as you can help [place] better understand your patient needs as well as improve your experience and satisfaction. WE CARE ABOUT YOU!!!   The patient should continue to follow-up with cardiology.  Your provider wants you to schedule an appointment with a Psychologist/Psychiatrist. The following list of offices requires the patient to call and make their own appointment, as there is information they need that only you can provide. Please feel free to choose form the following providers:  [facility]   [phone] Crisis Recovery in [place] [phone]  [facility]  [phone] [place], [place]  (Scheduled through [facility]) Must call and do an interview for appointment. Sees Children / Accepts Medicaid  [facility]    [phone]  [address]    [place], [place]       [facility]  [phone] [address] [place], [place]  Evaluates for Autism but does not treat it Sees Children / Accepts Medicaid  [facility]    [phone] [address], [address]   [place], [place] Medication management, substance abuse, bipolar, grief, family, marriage, OCD, anxiety, PTSD Sees children / Accepts Medicaid  [facility]    [phone] [address], [address] [place], [place] Sees children / Accepts [facility]  [facility]  [phone] [address] [place], [place]   Dr Estelle Grumbles     [phone] [address] [place], [place]  Sees ADD & ADHD for treatment Accepts [facility]  [facility]  [phone] [facility]  [place], [place] Evaluates for Autism [facility]  [facility]   [phone] [address] [place], [place]  Does Adult ADD evaluations Does not accept Medicaid  [facility]   [phone] [address]   [place], [place] Uses animal therapy  Sees  children as young as 77 years old Accepts Medicaid  We will make arrangements for the patient to see a psychologist in [place] to help him and got him through the stress that he is doing with The patient should talk to his law you're legally about what he can do The patient should follow-up with the vascular surgeon as planned

## 2016-05-02 ENCOUNTER — Other Ambulatory Visit: Payer: Self-pay | Admitting: Nurse Practitioner

## 2016-05-02 ENCOUNTER — Other Ambulatory Visit: Payer: PPO

## 2016-05-02 DIAGNOSIS — E785 Hyperlipidemia, unspecified: Secondary | ICD-10-CM | POA: Diagnosis not present

## 2016-05-02 NOTE — Telephone Encounter (Signed)
carvedilol (COREG) 25 MG tablet  Medication  Date: 05/01/2016 Department: [facility] Ordering/Authorizing: Ernestina Penna, MD  Order Providers   Prescribing Provider Encounter Provider  Ernestina Penna, MD Ernestina Penna, MD  Medication Detail    Disp Refills Start End   carvedilol (COREG) 25 MG tablet 180 tablet 3 05/01/2016    Sig - Route: Take 1 tablet (25 mg total) by mouth 2 (two) times daily with a meal. - Oral   Class: Print   Pharmacy   [phone] - [place], [place] - [address]

## 2016-05-02 NOTE — Addendum Note (Signed)
Addended by: Burton Apley A on: 05/02/2016 09:53 AM   Modules accepted: Orders

## 2016-05-03 LAB — GAMMA GT: GGT: 106 IU/L — AB (ref 0–65)

## 2016-05-03 LAB — LIPID PANEL
Chol/HDL Ratio: 2.5 ratio units (ref 0.0–5.0)
Cholesterol, Total: 102 mg/dL (ref 100–199)
HDL: 41 mg/dL (ref >39)
LDL CALC: 44 mg/dL (ref 0–99)
Triglycerides: 85 mg/dL (ref 0–149)
VLDL CHOLESTEROL CAL: 17 mg/dL (ref 5–40)

## 2016-05-03 LAB — ALKALINE PHOSPHATASE: ALK PHOS: 220 IU/L — AB (ref 39–117)

## 2016-05-07 ENCOUNTER — Other Ambulatory Visit: Payer: Self-pay | Admitting: Nurse Practitioner

## 2016-05-16 ENCOUNTER — Other Ambulatory Visit: Payer: Self-pay | Admitting: Family Medicine

## 2016-05-16 DIAGNOSIS — R945 Abnormal results of liver function studies: Principal | ICD-10-CM

## 2016-05-16 DIAGNOSIS — R7989 Other specified abnormal findings of blood chemistry: Secondary | ICD-10-CM

## 2016-05-17 LAB — HM DIABETES EYE EXAM

## 2016-05-21 ENCOUNTER — Ambulatory Visit (INDEPENDENT_AMBULATORY_CARE_PROVIDER_SITE_OTHER): Payer: PPO

## 2016-05-21 DIAGNOSIS — Z950 Presence of cardiac pacemaker: Secondary | ICD-10-CM | POA: Diagnosis not present

## 2016-05-21 DIAGNOSIS — I5022 Chronic systolic (congestive) heart failure: Secondary | ICD-10-CM

## 2016-05-21 NOTE — Progress Notes (Signed)
EPIC Encounter for ICM Monitoring  Patient Name: Christopher Padilla is a 76 y.o. male Date: 05/21/2016 Primary Care Physican: Redge Gainer, MD Primary Cardiologist:Taylor Electrophysiologist: Druscilla Brownie Weight:unknown (Patient is bilateral amputee) Bi-V Pacing: >99%  Heart Failure questions reviewed, pt asymptomatic  Thoracic impedance normal  Labs: 04/01/2016 Creatinine 1.56, BUN 28, Potassium 4.4, Sodium 138, EGFR 42-49  01/04/2016 Creatinine 1.71, BUN 27, Potassium 4.5, Sodium 140, EGFR 38-44  12/20/2015 Creatinine 1.50, BUN 20, Potassium 4.8, Sodium 137, EGFR 44-51  12/07/2015 Creatinine 1.21, BUN 16, Potassium 4.9, Sodium 139, EGFR 57-66  10/31/2015 Creatinine 1.45, BUN 22, Potassium 5.8, Sodium 135, EGFR 46-54  09/08/2015 Creatinine 1.69, BUN 22, Potassium 4.8, Sodium 134, EGFR 38-44  08/21/2015 Creatinine 1.46, BUN 18, Potassium 5.0, Sodium 139, EGFR 45-52  08/14/2015 Creatinine 1.50, BUN 21, Potassium 4.5, Sodium 137, EGFR 61-71  07/01/2015 Creatinine 1.29, BUN 22, Potassium 4.1, Sodium 140, EGFR 52->60   Recommendations: No changes. Discussed limiting dietary salt intake to 2000 mg/day and fluid intake to < 2 liters per day. Encouraged to call for fluid symptoms.  Follow-up plan: ICM clinic phone appointment on 06/24/2016.  Copy of ICM check sent to device physician.   3 month ICM trend: 05/21/2016   1 Year ICM trend:      Rosalene Billings, RN 05/21/2016 8:04 AM

## 2016-05-22 ENCOUNTER — Ambulatory Visit: Payer: PPO | Admitting: Vascular Surgery

## 2016-05-28 ENCOUNTER — Ambulatory Visit (HOSPITAL_COMMUNITY)
Admission: RE | Admit: 2016-05-28 | Discharge: 2016-05-28 | Disposition: A | Discharge status: Home / Self Care | Payer: PPO | Source: Ambulatory Visit | Attending: Family Medicine | Admitting: Family Medicine

## 2016-05-28 DIAGNOSIS — I700 Atherosclerosis of aorta: Secondary | ICD-10-CM | POA: Insufficient documentation

## 2016-05-28 DIAGNOSIS — R945 Abnormal results of liver function studies: Secondary | ICD-10-CM

## 2016-05-28 DIAGNOSIS — R7989 Other specified abnormal findings of blood chemistry: Secondary | ICD-10-CM | POA: Insufficient documentation

## 2016-05-29 ENCOUNTER — Telehealth: Payer: Self-pay | Admitting: Family Medicine

## 2016-05-30 NOTE — Telephone Encounter (Signed)
Pt concerned about elevated liver functions since [place] nml Do you have any recommendations Please advise

## 2016-05-30 NOTE — Telephone Encounter (Signed)
Pt notified of recommendation Verbalizes understanding 

## 2016-05-30 NOTE — Telephone Encounter (Signed)
Leave off simvastatin for 4 weeks and repeat liver function tests nonfasting

## 2016-06-04 ENCOUNTER — Telehealth: Payer: Self-pay | Admitting: Family Medicine

## 2016-06-04 ENCOUNTER — Ambulatory Visit (INDEPENDENT_AMBULATORY_CARE_PROVIDER_SITE_OTHER): Payer: PPO | Admitting: Family

## 2016-06-04 ENCOUNTER — Encounter: Payer: Self-pay | Admitting: Family

## 2016-06-04 VITALS — BP 117/54 | HR 60 | Temp 96.3°F | Ht 67.5 in | Wt 157.0 lb

## 2016-06-04 DIAGNOSIS — I739 Peripheral vascular disease, unspecified: Secondary | ICD-10-CM | POA: Diagnosis not present

## 2016-06-04 DIAGNOSIS — Z89511 Acquired absence of right leg below knee: Secondary | ICD-10-CM

## 2016-06-04 DIAGNOSIS — L98499 Non-pressure chronic ulcer of skin of other sites with unspecified severity: Secondary | ICD-10-CM | POA: Diagnosis not present

## 2016-06-04 DIAGNOSIS — I771 Stricture of artery: Secondary | ICD-10-CM

## 2016-06-04 DIAGNOSIS — I998 Other disorder of circulatory system: Secondary | ICD-10-CM

## 2016-06-04 DIAGNOSIS — R234 Changes in skin texture: Secondary | ICD-10-CM | POA: Diagnosis not present

## 2016-06-04 MED ORDER — CEPHALEXIN 500 MG PO CAPS: 500.0000 mg | ORAL_CAPSULE | Freq: Three times a day (TID) | ORAL | 0 refills | Status: DC

## 2016-06-04 MED ORDER — MUPIROCIN 2 % EX OINT: 1.0000 "application " | TOPICAL_OINTMENT | Freq: Two times a day (BID) | CUTANEOUS | 0 refills | Status: AC

## 2016-06-04 MED ORDER — SULFAMETHOXAZOLE-TRIMETHOPRIM 800-160 MG PO TABS: 1.0000 | ORAL_TABLET | Freq: Two times a day (BID) | ORAL | 0 refills | Status: DC

## 2016-06-04 NOTE — Progress Notes (Signed)
   Subjective:    Patient ID: Christopher Padilla, male    DOB: 11-Aug-1940, 76 y.o.   MRN: [id number]  HPI PT presents to the office today with left index finger and right ring finger soreness, redness, and swelling. PT states he noticed a "callus" on right index about two weeks ago, but has become open. Pt had the top joint of his right index amputated about a year and half ago related to PAD. PT has tired antibiotic ointment with no relief.    Review of Systems  Skin: Positive for rash.       Objective:   Physical Exam  Cardiovascular: Normal rate, regular rhythm, normal heart sounds and intact distal pulses.   Pulmonary/Chest: Effort normal and breath sounds normal.  Abdominal: Soft. Bowel sounds are normal.  Musculoskeletal: He exhibits no edema or tenderness.  Bilateral BKA   Skin: Skin is dry. Lesion noted.  1.5cm X1cm lesion on left index finger with purulent drainage and erythemas  1.3CMX0.5 cm fissure on right ring finger, with purulent and erythemas borders      BP (!) 117/54   Pulse 60   Temp (!) 96.3 F (35.7 C) (Oral)   Ht 5' 7.5" (1.715 m)   Wt 157 lb (71.2 kg)   BMI 24.23 kg/m      Assessment & Plan:  1. PAD (peripheral artery disease) (HCC)  2. Ischemia of upper extremity  3. 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 Fissure of skin - mupirocin ointment (BACTROBAN) 2 %; Place 1 application into the nose 2 (two) times daily.  Dispense: 22 g; Refill: 0  5. Arterial insufficiency with ischemic ulcer (HCC) - mupirocin ointment (BACTROBAN) 2 %; Place 1 application into the nose 2 (two) times daily.  Dispense: 22 g; Refill: 0  Start Keflex now, if wound becomes worse call office!!  Keep clean and dry Follow up with Vascular and PCP Keep good control of blood glucose  Jannifer Rodney, FNP

## 2016-06-04 NOTE — Patient Instructions (Signed)

## 2016-06-04 NOTE — Telephone Encounter (Signed)
Appt made for today

## 2016-06-24 ENCOUNTER — Telehealth: Payer: Self-pay | Admitting: Cardiology

## 2016-06-24 ENCOUNTER — Ambulatory Visit (INDEPENDENT_AMBULATORY_CARE_PROVIDER_SITE_OTHER): Payer: PPO | Admitting: *Deleted

## 2016-06-24 DIAGNOSIS — I442 Atrioventricular block, complete: Secondary | ICD-10-CM | POA: Diagnosis not present

## 2016-06-24 DIAGNOSIS — I5022 Chronic systolic (congestive) heart failure: Secondary | ICD-10-CM

## 2016-06-24 DIAGNOSIS — Z950 Presence of cardiac pacemaker: Secondary | ICD-10-CM | POA: Diagnosis not present

## 2016-06-24 NOTE — Telephone Encounter (Signed)
Spoke with pt and reminded pt of remote transmission that is due today. Pt verbalized understanding.   

## 2016-06-25 ENCOUNTER — Encounter: Payer: Self-pay | Admitting: Cardiology

## 2016-06-25 LAB — CUP PACEART REMOTE DEVICE CHECK
Battery Remaining Longevity: 83 mo
Battery Remaining Percentage: 95.5 %
Battery Voltage: 2.98 V
Brady Statistic AS VP Percent: 56 %
Brady Statistic AS VS Percent: 1 %
Implantable Lead Implant Date: 20160111
Implantable Lead Implant Date: 20160111
Implantable Lead Location: 753857
Implantable Lead Model: 4196
Implantable Pulse Generator Implant Date: 20160111
Lead Channel Impedance Value: 410 Ohm
Lead Channel Pacing Threshold Amplitude: 1 V
Lead Channel Pacing Threshold Pulse Width: 0.4 ms
Lead Channel Sensing Intrinsic Amplitude: 4.2 mV
Lead Channel Setting Pacing Amplitude: 2 V
Lead Channel Setting Pacing Amplitude: 2.5 V
Lead Channel Setting Pacing Pulse Width: 0.5 ms
MDC IDC LEAD IMPLANT DT: 20160111
MDC IDC LEAD LOCATION: 753859
MDC IDC LEAD LOCATION: 753860
MDC IDC MSMT LEADCHNL LV IMPEDANCE VALUE: 310 Ohm
MDC IDC MSMT LEADCHNL LV PACING THRESHOLD AMPLITUDE: 1.75 V
MDC IDC MSMT LEADCHNL LV PACING THRESHOLD PULSEWIDTH: 0.5 ms
MDC IDC MSMT LEADCHNL RA IMPEDANCE VALUE: 360 Ohm
MDC IDC MSMT LEADCHNL RA PACING THRESHOLD PULSEWIDTH: 0.4 ms
MDC IDC MSMT LEADCHNL RV PACING THRESHOLD AMPLITUDE: 0.75 V
MDC IDC MSMT LEADCHNL RV SENSING INTR AMPL: 12 mV
MDC IDC SESS DTM: 20180219070037
MDC IDC SET LEADCHNL RA PACING AMPLITUDE: 2 V
MDC IDC SET LEADCHNL RV PACING PULSEWIDTH: 0.4 ms
MDC IDC SET LEADCHNL RV SENSING SENSITIVITY: 4 mV
MDC IDC STAT BRADY AP VP PERCENT: 44 %
MDC IDC STAT BRADY AP VS PERCENT: 1 %
MDC IDC STAT BRADY RA PERCENT PACED: 43 %
Pulse Gen Model: 3222
Pulse Gen Serial Number: 7714017

## 2016-06-25 NOTE — Progress Notes (Signed)
Remote pacemaker transmission.   

## 2016-06-25 NOTE — Progress Notes (Signed)
EPIC Encounter for ICM Monitoring  Patient Name: Christopher Padilla is a 76 y.o. male Date: 06/25/2016 Primary Care Physican: Redge Gainer, MD Primary Cardiologist:Taylor Electrophysiologist: Druscilla Brownie Weight:unknown (Patient is bilateral amputee) Bi-V Pacing: >99%      Heart Failure questions reviewed, pt asymptomatic.  He can usually tell if he has fluid because of swelling in upper thighs.     Thoracic impedance normal.  He has not taken Lasix in the last week.  Current prescribed dose of Furosemide 40 mg daily.  Patient reported Dr Laurance Flatten decreased to 1/2 tablet (67m) as needed.   Labs: 04/01/2016 Creatinine 1.56, BUN 28, Potassium 4.4, Sodium 138, EGFR 42-49  01/04/2016 Creatinine 1.71, BUN 27, Potassium 4.5, Sodium 140, EGFR 38-44  12/20/2015 Creatinine 1.50, BUN 20, Potassium 4.8, Sodium 137, EGFR 44-51  12/07/2015 Creatinine 1.21, BUN 16, Potassium 4.9, Sodium 139, EGFR 57-66  10/31/2015 Creatinine 1.45, BUN 22, Potassium 5.8, Sodium 135, EGFR 46-54  09/08/2015 Creatinine 1.69, BUN 22, Potassium 4.8, Sodium 134, EGFR 38-44  08/21/2015 Creatinine 1.46, BUN 18, Potassium 5.0, Sodium 139, EGFR 45-52  08/14/2015 Creatinine 1.50, BUN 21, Potassium 4.5, Sodium 137, EGFR 61-71  07/01/2015 Creatinine 1.29, BUN 22, Potassium 4.1, Sodium 140, EGFR 52->60   Recommendations: No changes. Reminded him to take Lasix if he experiences fluid symptoms.  He said he limits salt intake to 2000 mg/day and fluid intake to < 2 liters/day. Encouraged to call for fluid symptoms.  Follow-up plan: ICM clinic phone appointment on 07/26/2016.  Copy of ICM check sent to device physician.   3 month ICM trend: 06/25/2016   1 Year ICM trend:      LRosalene Billings RN 06/25/2016 10:52 AM

## 2016-06-26 ENCOUNTER — Encounter: Payer: Self-pay | Admitting: Physician Assistant

## 2016-06-26 ENCOUNTER — Ambulatory Visit (INDEPENDENT_AMBULATORY_CARE_PROVIDER_SITE_OTHER): Payer: PPO

## 2016-06-26 ENCOUNTER — Ambulatory Visit (INDEPENDENT_AMBULATORY_CARE_PROVIDER_SITE_OTHER): Payer: PPO | Admitting: Physician Assistant

## 2016-06-26 VITALS — BP 131/60 | HR 60 | Temp 97.1°F | Ht 67.5 in | Wt 157.2 lb

## 2016-06-26 DIAGNOSIS — S6991XA Unspecified injury of right wrist, hand and finger(s), initial encounter: Secondary | ICD-10-CM | POA: Diagnosis not present

## 2016-06-26 DIAGNOSIS — L03011 Cellulitis of right finger: Secondary | ICD-10-CM

## 2016-06-26 MED ORDER — CEPHALEXIN 500 MG PO CAPS: 500.0000 mg | ORAL_CAPSULE | Freq: Three times a day (TID) | ORAL | 0 refills | Status: DC

## 2016-06-26 MED ORDER — HYDROCODONE-ACETAMINOPHEN 10-325 MG PO TABS: 1.0000 | ORAL_TABLET | Freq: Three times a day (TID) | ORAL | 0 refills | Status: DC | PRN

## 2016-06-26 NOTE — Patient Instructions (Signed)
Hand Pain Introduction Many things can cause hand pain. Some common causes are:  An injury.  Repeating the same movement with your hand over and over (overuse).  Osteoporosis.  Arthritis.  Lumps in the tendons or joints of the hand and wrist (ganglion cysts).  Infection. Follow these instructions at home: Pay attention to any changes in your symptoms. Take these actions to help with your discomfort:  If directed, put ice on the affected area:  Put ice in a plastic bag.  Place a towel between your skin and the bag.  Leave the ice on for 15-20 minutes, 3?4 times a day for 2 days.  Take over-the-counter and prescription medicines only as told by your health care provider.  Minimize stress on your hands and wrists as much as possible.  Take breaks from repetitive activity often.  Do stretches as told by your health care provider.  Do not do activities that make your pain worse. Contact a health care provider if:  Your pain does not get better after a few days of self-care.  Your pain gets worse.  Your pain affects your ability to do your daily activities. Get help right away if:  Your hand becomes warm, red, or swollen.  Your hand is numb or tingling.  Your hand is extremely swollen or deformed.  Your hand or fingers turn white or blue.  You cannot move your hand, wrist, or fingers. This information is not intended to replace advice given to you by your health care provider. Make sure you discuss any questions you have with your health care provider. Document Released: 05/19/2015 Document Revised: 09/28/2015 Document Reviewed: 05/18/2014  2017 Elsevier  

## 2016-06-30 NOTE — Progress Notes (Signed)
 BP 131/60   Pulse 60   Temp 97.1 F (36.2 C) (Oral)   Ht 5' 7.5" (1.715 m)   Wt 157 lb 3.2 oz (71.3 kg)   BMI 24.26 kg/m    Subjective:    Patient ID: Christopher Padilla, male    DOB: 02/17/1941, 75 y.o.   MRN: [id number]  HPI: Christopher Padilla is a 75 y.o. male presenting on 06/26/2016 for Hand Pain (right hand fell on concrete 2 weeks ago tomorrow, not able to sleep at night)  Has longstanding arthritis issues and tripped and fell. He injured his right hand with abrasions on the joints. Some swelling and redness around the wounds. Had finished and antibiotic for this but still infection. Also pain at the 4th and 5th fingers on the right hand. Decreased movement, severe pain at times.  Relevant past medical, surgical, family and social history reviewed and updated as indicated. Allergies and medications reviewed and updated.  Past Medical History:  Diagnosis Date  . Arthritis    "left hand" (08/21/2015)  . CAD (coronary artery disease)    S/P CABG, Feb 2003, LIMA to the LAD, left free radial artery to an obtuse marginal, spahenous vein graft to diagonal, saphenous vein graft to RCA with endarterectomy  . Cerebrovascular disease    MRA in 2005 with 75% stenosis in distal right vertebral artery and 75% stenosis greater in distal cervical internal carotid artery on teh right, severe bilateral disease and MRA of the extracranial circulation, high grade stenosis of the distal right internal carotid artery at the junction of the cervical internal carotid artery of the skull base  . CKD stage 3 due to type 2 diabetes mellitus (HCC)   . Collapsed lung   . Complete heart block (HCC) 05/14/2014   St. Jude BiV PM (serial number [id number]) pacemaker  . GERD (gastroesophageal reflux disease)   . History of blood transfusion 2002   "after my heart attack"  . History of stomach ulcers 1960s  . History of tobacco use    Quit in 1990  . Hyperlipidemia   . Hypertension   . Hypothyroidism   .  Myocardial infarction 2002  . Osteopenia 2016  . Peripheral vascular disease (HCC)    left hand  . PONV (postoperative nausea and vomiting)   . Presence of permanent cardiac pacemaker   . Type II diabetes mellitus (HCC)     Past Surgical History:  Procedure Laterality Date  . AMPUTATION  10/17/2011   Procedure: AMPUTATION DIGIT;  Surgeon: Marcus V Duda, MD;  Location: [facility] OR;  Service: Orthopedics;  Laterality: Left;  Amputation Left Index Finger at PIP Joint  . ARTERIAL BYPASS SURGRY     Left; left femoral to posterior tibial bypass gracting, left femoral artery and deep femoral artery endarterectomy with vein patch angioplasty of the common femoral arter and deep femoral artery 06/2005, right fremoral popliteal bypass, right iliac artery restent, bilateral below knee amputations  . BI-VENTRICULAR PACEMAKER INSERTION N/A 05/16/2014   Procedure: BI-VENTRICULAR PACEMAKER INSERTION (CRT-P);  Surgeon: Gregg W Taylor, MD; St. Jude BiV PM (serial number [id number]) pacemaker  . CARDIAC CATHETERIZATION    . CARPAL TUNNEL RELEASE Right   . CATARACT EXTRACTION W/ INTRAOCULAR LENS  IMPLANT, BILATERAL Bilateral   . CORONARY ARTERY BYPASS GRAFT  2002   CABG X "4", By Peter Van Trigt, MD  . ENDARTERECTOMY Left 09/07/2015   Procedure: ENDARTERECTOMY LEFT  BRACHIAL ARTERY ;  Surgeon: Christopher S Dickson, MD;  Location: [facility]   OR;  Service: Vascular;  Laterality: Left;  . INSERT / REPLACE / REMOVE PACEMAKER    . LEFT HEART CATHETERIZATION WITH CORONARY ANGIOGRAM N/A 05/10/2014   Procedure: LEFT HEART CATHETERIZATION WITH CORONARY ANGIOGRAM;  Surgeon: Christopher D McAlhany, MD;  Location: [facility] CATH LAB;  Service: Cardiovascular;  Laterality: N/A;  . LEG AMPUTATION BELOW KNEE Bilateral 2001-2004  . PATCH ANGIOPLASTY Left 09/07/2015   Procedure: LEFT BRACHIAL ARTERY PATCH ANGIOPLASTY USING XENOSURE BIOLOGIC PATCH;  Surgeon: Christopher S Dickson, MD;  Location: [facility] OR;  Service: Vascular;  Laterality: Left;  . PERIPHERAL  VASCULAR CATHETERIZATION N/A 08/21/2015   Procedure: Upper Extremity Angiography;  Surgeon: Christopher S Dickson, MD;  Location: [facility] INVASIVE CV LAB;  Service: Cardiovascular;  Laterality: N/A;  . PERIPHERAL VASCULAR CATHETERIZATION N/A 08/21/2015   Procedure: Aortic Arch Angiography;  Surgeon: Christopher S Dickson, MD;  Location: [facility] INVASIVE CV LAB;  Service: Cardiovascular;  Laterality: N/A;  . TEMPORARY PACEMAKER INSERTION N/A 05/14/2014   Procedure: TEMPORARY PACEMAKER INSERTION;  Surgeon: Michael D Cooper, MD;  Location: [facility] CATH LAB;  Service: Cardiovascular;  Laterality: N/A;  . THORACOTOMY     With drainage of a hemathroax and decortication of fibrothorax    Review of Systems  Constitutional: Negative.  Negative for appetite change and fatigue.  HENT: Negative.   Eyes: Negative.  Negative for pain and visual disturbance.  Respiratory: Negative.  Negative for cough, chest tightness, shortness of breath and wheezing.   Cardiovascular: Negative.  Negative for chest pain, palpitations and leg swelling.  Gastrointestinal: Negative.  Negative for abdominal pain, diarrhea, nausea and vomiting.  Endocrine: Negative.   Genitourinary: Negative.   Musculoskeletal: Positive for arthralgias, joint swelling and myalgias.  Skin: Positive for rash and wound. Negative for color change.  Neurological: Negative.  Negative for weakness, numbness and headaches.  Psychiatric/Behavioral: Negative.     Allergies as of 06/26/2016      Reactions   Codeine Other (See Comments)   hallucinations   Ciprofloxacin Diarrhea   Nsaids Other (See Comments)   GI upset      Medication List       Accurate as of 06/26/16 11:59 PM. Always use your most recent med list.          ACCU-CHEK AVIVA PLUS w/Device Kit Check BG 4XDaily DX E11.65   ACCU-CHEK SOFTCLIX LANCETS lancets Check BG 4Xdaily DX E11.65   aspirin 81 MG tablet Take 81 mg by mouth daily.   carvedilol 25 MG tablet Commonly known as:   COREG Take 1 tablet (25 mg total) by mouth 2 (two) times daily with a meal.   cephALEXin 500 MG capsule Commonly known as:  KEFLEX Take 1 capsule (500 mg total) by mouth 3 (three) times daily.   furosemide 40 MG tablet Commonly known as:  LASIX Take 1 tablet (40 mg total) by mouth daily.   gabapentin 600 MG tablet Commonly known as:  NEURONTIN Take 1 tablet (600 mg total) by mouth 2 (two) times daily.   glucose blood test strip Check BS QID and PRN   HYDROcodone-acetaminophen 10-325 MG tablet Commonly known as:  NORCO Take 1 tablet by mouth every 8 (eight) hours as needed.   insulin aspart 100 UNIT/ML injection Commonly known as:  NOVOLOG Inject 0-20 units as directed per sliding scale   insulin glargine 100 UNIT/ML injection Commonly known as:  LANTUS Inject 0.32 mLs (32 Units total) into the skin at bedtime.   Insulin Syringe-Needle U-100 31G X 15/64" 0.5 ML Misc Commonly   known as:  BD INSULIN SYRINGE ULTRAFINE Use once daily   levothyroxine 125 MCG tablet Commonly known as:  SYNTHROID, LEVOTHROID TAKE 1 TABLET (125 MCG TOTAL) BY MOUTH DAILY.   mupirocin ointment 2 % Commonly known as:  BACTROBAN Place 1 application into the nose 2 (two) times daily.   nortriptyline 25 MG capsule Commonly known as:  PAMELOR TAKE 1 CAPSULE (25 MG TOTAL) BY MOUTH AT BEDTIME.   omeprazole 20 MG capsule Commonly known as:  PRILOSEC Take 1 capsule (20 mg total) by mouth every other day.   simvastatin 40 MG tablet Commonly known as:  ZOCOR TAKE 1 TABLET (40 MG TOTAL) BY MOUTH AT BEDTIME.   VITAMIN D PO Take 1,000 Units by mouth daily.          Objective:    BP 131/60   Pulse 60   Temp 97.1 F (36.2 C) (Oral)   Ht 5' 7.5" (1.715 m)   Wt 157 lb 3.2 oz (71.3 kg)   BMI 24.26 kg/m   Allergies  Allergen Reactions  . Codeine Other (See Comments)    hallucinations  . Ciprofloxacin Diarrhea  . Nsaids Other (See Comments)    GI upset    Physical Exam  Constitutional:  He appears well-developed and well-nourished. No distress.  HENT:  Head: Normocephalic and atraumatic.  Eyes: Conjunctivae and EOM are normal. Pupils are equal, round, and reactive to light.  Cardiovascular: Normal rate, regular rhythm and normal heart sounds.   Pulmonary/Chest: Effort normal and breath sounds normal. No respiratory distress.  Musculoskeletal:       Right shoulder: He exhibits decreased range of motion, tenderness, swelling, pain and spasm. He exhibits no deformity.       Arms: Skin: Skin is warm and dry. Abrasion and lesion noted. There is erythema.     Psychiatric: He has a normal mood and affect. His behavior is normal.  Nursing note and vitals reviewed.       Assessment & Plan:   1. Injury of right hand, initial encounter Possible avulsion fracture at base of 5th metatarsal - DG Hand Complete Right; Future - HYDROcodone-acetaminophen (NORCO) 10-325 MG tablet; Take 1 tablet by mouth every 8 (eight) hours as needed.  Dispense: 30 tablet; Refill: 0  2. Cellulitis of finger of right hand - cephALEXin (KEFLEX) 500 MG capsule; Take 1 capsule (500 mg total) by mouth 3 (three) times daily.  Dispense: 30 capsule; Refill: 0   Continue all other maintenance medications as listed above.  Follow up plan: Return if symptoms worsen or fail to improve.  Educational handout given for injury of hand  Terald Sleeper PA-C [place] [address]  [facility], [place] [address] [phone]   06/30/2016, 9:17 PM

## 2016-07-02 ENCOUNTER — Encounter: Payer: Self-pay | Admitting: Pharmacist

## 2016-07-02 ENCOUNTER — Ambulatory Visit (INDEPENDENT_AMBULATORY_CARE_PROVIDER_SITE_OTHER): Payer: PPO | Admitting: Pharmacist

## 2016-07-02 VITALS — BP 130/64 | HR 68 | Ht 68.0 in | Wt 158.0 lb

## 2016-07-02 DIAGNOSIS — N183 Chronic kidney disease, stage 3 (moderate): Secondary | ICD-10-CM | POA: Diagnosis not present

## 2016-07-02 DIAGNOSIS — E1165 Type 2 diabetes mellitus with hyperglycemia: Secondary | ICD-10-CM

## 2016-07-02 DIAGNOSIS — E118 Type 2 diabetes mellitus with unspecified complications: Secondary | ICD-10-CM | POA: Diagnosis not present

## 2016-07-02 DIAGNOSIS — R748 Abnormal levels of other serum enzymes: Secondary | ICD-10-CM

## 2016-07-02 DIAGNOSIS — E1122 Type 2 diabetes mellitus with diabetic chronic kidney disease: Secondary | ICD-10-CM | POA: Diagnosis not present

## 2016-07-02 DIAGNOSIS — Z794 Long term (current) use of insulin: Secondary | ICD-10-CM

## 2016-07-02 LAB — BAYER DCA HB A1C WAIVED: HB A1C: 10.7 % — AB (ref <7.0)

## 2016-07-02 MED ORDER — INSULIN PEN NEEDLE 33G X 4 MM MISC: 1.0000 | Freq: Four times a day (QID) | 1 refills | Status: DC

## 2016-07-02 MED ORDER — INSULIN ASPART 100 UNIT/ML FLEXPEN: 1.0000 [IU] | PEN_INJECTOR | Freq: Three times a day (TID) | SUBCUTANEOUS | 1 refills | Status: AC

## 2016-07-02 MED ORDER — INSULIN GLARGINE 100 UNIT/ML SOLOSTAR PEN: 32.0000 [IU] | PEN_INJECTOR | Freq: Every day | SUBCUTANEOUS | 1 refills | Status: DC

## 2016-07-02 NOTE — Patient Instructions (Addendum)
1 serving of carbohydrate containing food 2 or 3 servings of carbohydrate containing foods 4 or more serving of carbohydrate containing foods  5 units of Humalog insulin 7 unit of Humalog insulin 9 units of Humalog insulin     Blood glucose  Amount of Humalog insulin  Less than 70 Hold Humalog,  treat low blood glucose with "quick sugar"  and recheck blood glucose in 15 to 20 minutes  70 to 100 none  101 to 120 2 units  121 to 140 4 units  141 to 160 6 units  161 to 180 7 units  181 to 200 8 units  201 or over 10 units

## 2016-07-02 NOTE — Progress Notes (Signed)
Patient ID: Christopher Padilla, male   DOB: Jan 07, 1941, 76 y.o.   MRN: [id number]   Subjective:    Christopher Padilla is a 76 y.o. male  Referred by his PCP Christopher Padilla for insulin adjustment and diabetes education.  He is also here today to have CGM placed Christopher Padilla has insulin dependent DM which currently is uncontrolled.   The patient was initially diagnosed with Type 2 diabetes mellitus about 36 years ago (at 76 yo).    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   1 serving of carbohydrate containing food 2 or 3 servings of carbohydrate containing foods 4 or more serving of carbohydrate containing foods  5 units of humalog insulin 7 unit of humalog insulin 9 units of humalog insulin     Blood glucose  Amount of Humalog insulin  Less than 100 none  101 to 120 2 units  121 to 140 4 units  141 to 160 6 units  161 to 180 7 units  181 to 200 8 units  201 or over 10 units        Current monitoring regimen: home blood tests - 1-2  times daily  - patient only checks left hand due to decreased dexterity of left hand (loss of pointer finger on left hand).  He did not bring glucometer today Highest BG reading - 300's (patient realized that for 2 days his Lantus vial was actually empty although he thought he was injecting insulin) Any episodes of hypoglycemia? BG readings for the last 3 monrings has been less than 70:  56, 62 and 54  Injects insulin in abdomen or upper thigh.  Uses vials and  syringes.  Self administers insulin.  Is He on ACE inhibitor or angiotensin II receptor blocker?  No ramipril was discontinued around 04/2014 - possibly related to increase serum creatinine which has since decreased and is currently stable.   On 04/01/2016 Alk Phos was elevated at 214.  This was rechecked 05/02/2016 and was still elevated at 220.  Christopher Laurance Flatten has ordered fractioned Alk Phos / Alk Phos isoenzymes. Patient stopped simvastatin about 2-3 weeks ago due to elevation.  The following portions of the patient's history were reviewed and updated as appropriate: allergies, current medications, past family history, past medical history, past social history, past surgical history and problem list.   Objective:    BP 130/64   Pulse 68   Ht 5' 8" (1.727 m)   Wt 158 lb (71.7 kg)   BMI 24.02 kg/m   Lab Review Glucose (mg/dL)  Date Value  04/01/2016 180 (H)  01/04/2016 112 (H)  12/20/2015 243 (H)   Glucose, Bld (mg/dL)  Date Value  09/08/2015 301 (H)  09/07/2015 141 (H)  08/21/2015 207 (H)   CO2 (mmol/L)  Date Value  04/01/2016 28  01/04/2016 25  12/20/2015 27   BUN (mg/dL)  Date Value  04/01/2016 28 (H)  01/04/2016 27  12/20/2015 20   Creat (mg/dL)  Date Value  09/29/2012 1.22   Creatinine, Ser (mg/dL)  Date Value  04/01/2016 1.56 (H)  01/04/2016 1.71 (H)  12/20/2015 1.50 (H)      Assessment:    Diabetes Mellitus type II, under inadequate control with reports of both hypo and hyperglycemia  HTN - controlled Elevated Alk Phos Medication managment  Plan:    1.  Rx changes:   Changed both lantus and novolog to pens which I think will be easier to inject and see when he is about to run out.   Decrease lantus to 30 units daily  Patient is instructed to follow Novolog dosing per below:    1 serving of carbohydrate containing food 2 or 3 servings of carbohydrate containing foods 4 or more serving of carbohydrate containing foods  5 units of Humalog  insulin 7 unit of Humalog insulin 9 units of Humalog insulin     Blood glucose  Amount of Humalog insulin  Less than 70 Hold Humalog,  treat low blood glucose with "quick sugar"  and recheck blood glucose in 15 to 20 minutes  70 to 100 none  101 to 120 2 units  121 to 140 4 units  141 to 160 6 units  161 to 180 7 units  181 to 200 8 units  201 or over 10 units         2.  Continuous Glucose monitor was placed today to try to get better understanding of BG trends.  Patient educated about proper care of CGM and site.  CGM was placed on back of upper left arm.  Patient will wear at least 5 days and up to 14 days.  He is to keep BG, diet and insulin administration records. He can engage in regular activities with special care when showering, changing clothes and swimming (only up to 3 feet and for 30 minutes at a time) 3.  RTC in 14 days to see PCP and have CGM removed. 4.  Checking LFT and alk phos isoenzymes due to elevated alk phos. - patient has been off statin / simvastatin for 2-3 week.  Cherre Robins, PharmD, CPP, CDE

## 2016-07-05 LAB — HEPATIC FUNCTION PANEL
ALT: 19 IU/L (ref 0–44)
AST: 23 IU/L (ref 0–40)
Albumin: 3.5 g/dL (ref 3.5–4.8)
Alkaline Phosphatase: 257 IU/L — ABNORMAL HIGH (ref 39–117)
BILIRUBIN TOTAL: 0.7 mg/dL (ref 0.0–1.2)
Bilirubin, Direct: 0.31 mg/dL (ref 0.00–0.40)
Total Protein: 7.1 g/dL (ref 6.0–8.5)

## 2016-07-05 LAB — ALKALINE PHOSPHATASE, ISOENZYMES
BONE FRACTION: 16 % (ref 12–68)
INTESTINAL FRAC.: 2 % (ref 0–18)
LIVER FRACTION: 82 % (ref 13–88)

## 2016-07-15 NOTE — Progress Notes (Signed)
ICM remote transmission rescheduled from 07/26/2016 to 07/30/2016.  

## 2016-07-16 ENCOUNTER — Telehealth: Payer: Self-pay | Admitting: Internal Medicine

## 2016-07-16 ENCOUNTER — Ambulatory Visit (INDEPENDENT_AMBULATORY_CARE_PROVIDER_SITE_OTHER): Payer: PPO

## 2016-07-16 DIAGNOSIS — I5022 Chronic systolic (congestive) heart failure: Secondary | ICD-10-CM

## 2016-07-16 DIAGNOSIS — Z950 Presence of cardiac pacemaker: Secondary | ICD-10-CM

## 2016-07-16 NOTE — Progress Notes (Signed)
EPIC Encounter for ICM Monitoring  Patient Name: Christopher Padilla is a 76 y.o. male Date: 07/16/2016 Primary Care Physican: Redge Gainer, MD Primary Cardiologist:Taylor Electrophysiologist: Druscilla Brownie Weight:unknown (Patient is bilateral amputee) Bi-V Pacing: >99%     Patient called main office number and spoke with triage nurse regarding symptoms.  She advised him to send remote transmission and ICM would contact him.  Spoke with patient.  He reported is he symptomatic with shortness of breath since 07/14/2016 and legs are swollen.  Breathing has not affected his sleep and no SOB is audible over the phone.     Thoracic impedance abormal suggesting fluid accumulation since 07/14/2016 which correlates with his sypmptoms.  Prescribed and confirmed dosage: Furosemide 40 mg 1 tablet daily.  He takes differently. 0.5 tablet (20 mg total) daily as Dr Laurance Flatten stated he could decrease.   Labs: 04/01/2016 Creatinine 1.56, BUN 28, Potassium 4.4, Sodium 138, EGFR 42-49  01/04/2016 Creatinine 1.71, BUN 27, Potassium 4.5, Sodium 140, EGFR 38-44  12/20/2015 Creatinine 1.50, BUN 20, Potassium 4.8, Sodium 137, EGFR 44-51  12/07/2015 Creatinine 1.21, BUN 16, Potassium 4.9, Sodium 139, EGFR 57-66  10/31/2015 Creatinine 1.45, BUN 22, Potassium 5.8, Sodium 135, EGFR 46-54  09/08/2015 Creatinine 1.69, BUN 22, Potassium 4.8, Sodium 134, EGFR 38-44  08/21/2015 Creatinine 1.46, BUN 18, Potassium 5.0, Sodium 139, EGFR 45-52  08/14/2015 Creatinine 1.50, BUN 21, Potassium 4.5, Sodium 137, EGFR 61-71  07/01/2015 Creatinine 1.29, BUN 22, Potassium 4.1, Sodium 140, EGFR 52->60   Recommendations:  Patient reported taking Furosemide 1 tablet 40 mg today instead of the 1/2 tablet.  Advised since the Furosemide is ordered 40 mg daily he can take 1 tablet for the next 2 days and will recheck fluid levels again on 07/19/2016.  Advised if symptoms do not improve at taking whole tablet tomorrow to call Dr  Tawanna Sat office for further instructions since Dr Laurance Flatten is managing the Furosemide prescription.  He verbalized understanding. He said taking a Furosemide 1 tablet will normally resolve symptoms.    Follow-up plan: ICM clinic phone appointment on 07/19/2016 to recheck fluid levels.  Copy of ICM check sent to primary care physician and device physician.   3 month ICM trend: 07/16/2016   1 Year ICM trend:     Rosalene Billings, RN 07/16/2016 12:40 PM

## 2016-07-16 NOTE — Telephone Encounter (Signed)
07/16/2016 12:15 Returned call to patient who complains of shortness of breath, weakness, increased shortness of breath with ambulation from room to room.  Pt also reports fulling of fullness in his "tummy," as if he has extra fluid.  Patient's appointment for 07/15/16 for BiV transmission was canceled due to inclement weather.  Spoke with ICM nurse she is going to call patient to request a transmission for review.  Pt was speaking in full sentences, no shortness of breath noted telephonically.  He states he took an extra 1/2 of lasix this AM per his PCP.  Jim Like MHA RN CCM.

## 2016-07-16 NOTE — Telephone Encounter (Signed)
Pt c/o Shortness Of Breath: STAT if SOB developed within the last 24 hours or pt is noticeably SOB on the phone  1. Are you currently SOB (can you hear that pt is SOB on the phone)? no  2. How long have you been experiencing SOB? 3 days  3. Are you SOB when sitting or when up moving around?when up moving around 4. Are you currently experiencing any other symptoms? Pt said he is very sleepy all of the time

## 2016-07-16 NOTE — Telephone Encounter (Signed)
Spoke with patient and received ICM remote transmission.  See ICM note today for further information.

## 2016-07-17 NOTE — Progress Notes (Signed)
Patient is having increasing shortness of breath anymore edema he needs to increase his Lasix back to the previous dose of 40 mg.

## 2016-07-18 NOTE — Progress Notes (Addendum)
Call to patient and he reported he is feeling some better today.  Leg swelling and breathing have improved but not completely resolved.  Advised Dr Christell Constant recommended he return to previous  Furosemide dose of 40mg  daily and patient verbalized understanding.  Will recheck remote transmission 07/19/2016.

## 2016-07-19 ENCOUNTER — Ambulatory Visit (INDEPENDENT_AMBULATORY_CARE_PROVIDER_SITE_OTHER): Payer: PPO

## 2016-07-19 ENCOUNTER — Telehealth: Payer: Self-pay | Admitting: Cardiology

## 2016-07-19 DIAGNOSIS — Z950 Presence of cardiac pacemaker: Secondary | ICD-10-CM

## 2016-07-19 DIAGNOSIS — I5022 Chronic systolic (congestive) heart failure: Secondary | ICD-10-CM

## 2016-07-19 NOTE — Progress Notes (Signed)
EPIC Encounter for ICM Monitoring  Patient Name: Christopher Padilla is a 76 y.o. male Date: 07/19/2016 Primary Care Physican: Redge Gainer, MD Primary Cardiologist:Taylor Electrophysiologist: Druscilla Brownie Weight:unknown (Patient is bilateral amputee) Bi-V Pacing: >99%      Heart Failure questions reviewed, pt reported breathing is back to baseline and very small amount of swelling left in legs but has greatly improved.   Thoracic impedance returned to normal 07/18/2016 after Dr Laurance Flatten recommended patient stay on Furosemide 40 mg 1 tablet daily.   Prescribed and confirmed dosage: Furosemide 40 mg 1 tablet daily.  Labs: 04/01/2016 Creatinine 1.56, BUN 28, Potassium 4.4, Sodium 138, EGFR 42-49  01/04/2016 Creatinine 1.71, BUN 27, Potassium 4.5, Sodium 140, EGFR 38-44  12/20/2015 Creatinine 1.50, BUN 20, Potassium 4.8, Sodium 137, EGFR 44-51  12/07/2015 Creatinine 1.21, BUN 16, Potassium 4.9, Sodium 139, EGFR 57-66  10/31/2015 Creatinine 1.45, BUN 22, Potassium 5.8, Sodium 135, EGFR 46-54  09/08/2015 Creatinine 1.69, BUN 22, Potassium 4.8, Sodium 134, EGFR 38-44  08/21/2015 Creatinine 1.46, BUN 18, Potassium 5.0, Sodium 139, EGFR 45-52  08/14/2015 Creatinine 1.50, BUN 21, Potassium 4.5, Sodium 137, EGFR 61-71  07/01/2015 Creatinine 1.29, BUN 22, Potassium 4.1, Sodium 140, EGFR 52->60   Recommendations: Advised will send copy of ICM check to Dr Laurance Flatten and Dr Lovena Le to show impedance has returned to normal and symptoms almost resolved.   Follow-up plan: ICM clinic phone appointment on 07/30/2016.  3 month ICM trend: 07/19/2016      1 Year ICM trend:      Rosalene Billings, RN 07/19/2016 1:44 PM

## 2016-07-19 NOTE — Telephone Encounter (Signed)
Spoke with pt and reminded pt of remote transmission that is due today. Pt verbalized understanding.   

## 2016-07-30 ENCOUNTER — Ambulatory Visit (INDEPENDENT_AMBULATORY_CARE_PROVIDER_SITE_OTHER): Payer: PPO

## 2016-07-30 ENCOUNTER — Telehealth: Payer: Self-pay

## 2016-07-30 DIAGNOSIS — I5022 Chronic systolic (congestive) heart failure: Secondary | ICD-10-CM

## 2016-07-30 DIAGNOSIS — Z950 Presence of cardiac pacemaker: Secondary | ICD-10-CM | POA: Diagnosis not present

## 2016-07-30 NOTE — Progress Notes (Signed)
EPIC Encounter for ICM Monitoring  Patient Name: Christopher Padilla is a 76 y.o. male Date: 07/30/2016 Primary Care Physican: Redge Gainer, MD Primary Cardiologist:Taylor Electrophysiologist: Druscilla Brownie Weight:unknown (Patient is bilateral amputee) Bi-V Pacing: >99%               Attempted call to patient and unable to reach.   Transmission reviewed.    Thoracic impedance remains normal since taking Furosemide 40 mg daily.  Prescribed and confirmed dosage: Furosemide 40 mg 1 tablet daily.  Labs: 04/01/2016 Creatinine 1.56, BUN 28, Potassium 4.4, Sodium 138, EGFR 42-49  01/04/2016 Creatinine 1.71, BUN 27, Potassium 4.5, Sodium 140, EGFR 38-44  12/20/2015 Creatinine 1.50, BUN 20, Potassium 4.8, Sodium 137, EGFR 44-51  12/07/2015 Creatinine 1.21, BUN 16, Potassium 4.9, Sodium 139, EGFR 57-66  10/31/2015 Creatinine 1.45, BUN 22, Potassium 5.8, Sodium 135, EGFR 46-54  09/08/2015 Creatinine 1.69, BUN 22, Potassium 4.8, Sodium 134, EGFR 38-44  08/21/2015 Creatinine 1.46, BUN 18, Potassium 5.0, Sodium 139, EGFR 45-52  08/14/2015 Creatinine 1.50, BUN 21, Potassium 4.5, Sodium 137, EGFR 61-71  07/01/2015 Creatinine 1.29, BUN 22, Potassium 4.1, Sodium 140, EGFR 52->60   Recommendations: NONE - Unable to reach patient   Follow-up plan: ICM clinic phone appointment on Sep 26, 2016.  Copy of ICM check sent to PCP and device physician.   3 month ICM trend: 07/30/2016     1 Year ICM trend:      Rosalene Billings, RN 07/30/2016 12:20 PM

## 2016-07-30 NOTE — Progress Notes (Addendum)
Patient returned call and reported the leg swelling and breathing has improved. He confirmed he is taking Furosemide 40 mg daily.  No changes today and encouraged to call for any fluid symptoms.

## 2016-07-30 NOTE — Telephone Encounter (Signed)
Remote ICM transmission received.  Attempted patient call and no mail box.

## 2016-07-31 ENCOUNTER — Encounter: Payer: Self-pay | Admitting: Family Medicine

## 2016-07-31 ENCOUNTER — Telehealth: Payer: Self-pay

## 2016-07-31 ENCOUNTER — Ambulatory Visit (INDEPENDENT_AMBULATORY_CARE_PROVIDER_SITE_OTHER): Payer: PPO | Admitting: Pharmacist

## 2016-07-31 ENCOUNTER — Ambulatory Visit (INDEPENDENT_AMBULATORY_CARE_PROVIDER_SITE_OTHER): Payer: PPO | Admitting: Family Medicine

## 2016-07-31 VITALS — BP 123/54 | HR 65 | Temp 96.8°F | Ht 68.0 in | Wt 161.0 lb

## 2016-07-31 VITALS — BP 133/60 | HR 70

## 2016-07-31 DIAGNOSIS — E1122 Type 2 diabetes mellitus with diabetic chronic kidney disease: Secondary | ICD-10-CM

## 2016-07-31 DIAGNOSIS — S41101D Unspecified open wound of right upper arm, subsequent encounter: Secondary | ICD-10-CM

## 2016-07-31 DIAGNOSIS — Z794 Long term (current) use of insulin: Secondary | ICD-10-CM

## 2016-07-31 DIAGNOSIS — IMO0002 Reserved for concepts with insufficient information to code with codable children: Secondary | ICD-10-CM

## 2016-07-31 DIAGNOSIS — E1165 Type 2 diabetes mellitus with hyperglycemia: Secondary | ICD-10-CM

## 2016-07-31 DIAGNOSIS — S6991XA Unspecified injury of right wrist, hand and finger(s), initial encounter: Secondary | ICD-10-CM

## 2016-07-31 DIAGNOSIS — N183 Chronic kidney disease, stage 3 (moderate): Secondary | ICD-10-CM

## 2016-07-31 DIAGNOSIS — I739 Peripheral vascular disease, unspecified: Secondary | ICD-10-CM | POA: Diagnosis not present

## 2016-07-31 DIAGNOSIS — Z48812 Encounter for surgical aftercare following surgery on the circulatory system: Secondary | ICD-10-CM

## 2016-07-31 DIAGNOSIS — I998 Other disorder of circulatory system: Secondary | ICD-10-CM

## 2016-07-31 MED ORDER — DOXYCYCLINE HYCLATE 100 MG PO TABS: 100.0000 mg | ORAL_TABLET | Freq: Two times a day (BID) | ORAL | 0 refills | Status: DC

## 2016-07-31 MED ORDER — TRAMADOL HCL 50 MG PO TABS: 50.0000 mg | ORAL_TABLET | Freq: Two times a day (BID) | ORAL | 0 refills | Status: AC | PRN

## 2016-07-31 NOTE — Patient Instructions (Signed)
1 serving of carbohydrate containing food 2 or 3 servings of carbohydrate containing foods 4 or more serving of carbohydrate containing foods  1 units of Humalog insulin 3 unit of Humalog insulin 5 units of Humalog insulin     Blood glucose  Amount of Humalog insulin   Less than 70 Hold Humalog,  treat low blood glucose with "quick sugar"  and recheck blood glucose in 15 to 20 minutes  70 to 100 none  101 to 120 2 units  121 to 140 4 units  141 to 160 6 units  161 to 180 7 units  181 to 200 8 units  201 or over 10 units        Continue to take Lantus 32 units each evening.

## 2016-07-31 NOTE — Patient Instructions (Signed)
Great to see you!  Try tramadol for pain, you only need to use this if it helps for pain.   Doxycyline is an antibiotic, start it today.   I think the most important next step is seeing your vascular surgeon.

## 2016-07-31 NOTE — Telephone Encounter (Signed)
Discussed reported symptoms with Dr. [name].  Recommended to schedule for Bilateral UE Arterial Duplex with office visit.

## 2016-07-31 NOTE — Telephone Encounter (Signed)
Has appt 4/2 with NP

## 2016-07-31 NOTE — Telephone Encounter (Signed)
rec'd voice message from T. Clydie Braun, Pharm D, @ [facility].  Reported the pt. Fell in Feb. And injured the right hand, and has been slow to heal.  Reported the pt. has blanching of the right hand fingertips, and c/o pain right hand.  Is requesting to move the May 2018 appt. To an earlier date.  Will discuss with Dr. [name] for recommendation on Vascular studies.

## 2016-07-31 NOTE — Progress Notes (Signed)
Patient ID: Christopher Padilla, male   DOB: [birth year], 76 y.o.   MRN: [id number]    Subjective:    Christopher Padilla is a 76 y.o. male  Referred by his PCP Dr Christopher Padilla for insulin adjustment and diabetes education.  Though he is most concerned about his right hand. He has been experiencing pain and is concerned about the redness and tenderness.  He has a visit scheduled with vascular surgeon for recheck but not until 10/02/2016 Christopher Padilla has insulin dependent DM which currently is uncontrolled.   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 Cardiovascular risk factors: advanced age (older than 69 for men, 57 for women), diabetes mellitus, dyslipidemia, hypertension and male gender Current diabetic medications include Lantus 32 units at bedtime and Humalog per below though he states that he is not following scales because he is afraid to take more than 7 units of insulin   1 serving of carbohydrate containing food 2 or 3 servings of carbohydrate containing foods 4 or more serving of carbohydrate containing foods  5 units of humalog insulin 7 unit of humalog insulin 9 units of humalog insulin     Blood glucose  Amount of Humalog insulin  Less than 100 none  101 to 120 2 units  121 to 140 4 units  141 to 160 6 units  161 to 180 7 units  181 to 200 8 units  201 or over 10 units        Current monitoring regimen: home blood tests - 1-2  times daily  - patient only checks left hand due to decreased dexterity of left hand (loss of pointer finger on left hand).  He did not bring glucometer today Highest BG reading - 260 Any episodes of hypoglycemia? Had one event - BG was 57 - occurred in the morning about 7am  Injects insulin in abdomen or upper thigh.   Uses vials and syringes. I sent in Rx for pens for easier administration, however patient has not picked up yet because he still had syringes and vials. Self administers insulin.  Is He on ACE inhibitor or angiotensin II receptor blocker?  No ramipril was discontinued around 04/2014 - possibly related to increase serum creatinine which has since decreased and is currently stable.   The following portions of the patient's history were reviewed and updated as appropriate: allergies and current medications.   Objective:    BP 133/60   Pulse 70   Lab Review Glucose (mg/dL)  Date Value  11/91/4782 180 (H)  01/04/2016 112 (H)  12/20/2015 243 (H)   Glucose, Bld (mg/dL)  Date Value  95/62/1308 301 (H)  09/07/2015 141 (H)  08/21/2015 207 (H)   CO2 (mmol/L)  Date Value  04/01/2016 28  01/04/2016 25  12/20/2015 27   BUN (mg/dL)  Date Value  65/78/4696 28 (H)  01/04/2016 27  12/20/2015 20   Creat (mg/dL)  Date Value  29/52/8413 1.22   Creatinine, Ser (mg/dL)  Date Value  24/40/1027 1.56 (H)  01/04/2016 1.71 (H)  12/20/2015 1.50 (H)      Assessment:    Diabetes Mellitus type II, under inadequate control with reports of both hypo and hyperglycemia  HTN - controlled   Plan:    1.  Rx changes:    Continue  lantus to 32 units  daily  Patient is instructed to follow Novolog dosing per below:    1 serving of carbohydrate containing food 2 or 3 servings of carbohydrate containing foods 4 or more serving of carbohydrate containing foods  1 units of Humalog insulin 3 unit of Humalog insulin 5 units of Humalog insulin     Blood glucose  Amount of Humalog insulin   Less than 70 Hold Humalog,  treat low blood glucose with "quick sugar"  and recheck blood glucose in 15 to 20 minutes  70 to 100 none  101 to 120 2 units  121 to 140 4 units  141 to 160 6 units  161 to 180 7 units  181 to 200 8 units  201 or over 10 units         Triaged to see physical regarding hand pain / possible infection. Called vascular surgeon and appt moved up to 08/04/16 with the PA in their office. RTC to see me in 2-3 weeks.     Christopher Padilla, PharmD, CPP, CDE

## 2016-07-31 NOTE — Progress Notes (Signed)
   HPI  Patient presents today with right hand pain.  Patient had a fall on 06/26/2016, about 5 weeks ago, causing some abrasions to the third through fifth fingers in the right hand and a small avulsion fracture on the fifth digit. Patient states his fifth digit pain has improved from the beginning, however he continues to have deep-seated pain and nonhealing wounds on those involved fingers.  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 Patient states that the pain hurts frequently. He tried hydrocodone for it, however he did not like the sedation and stopped trying it after one pill.  Patient uses nortriptyline for neuropathy which works moderately well. Patient is also status post BKA 2  PMH: Smoking status noted ROS: Per HPI  Objective: BP (!) 123/54   Pulse 65   Temp (!) 96.8 F (36 C) (Oral)   Ht 5\' 8"  (1.727 m)   Wt 161 lb (73 kg)   BMI 24.48 kg/m  Gen: NAD, alert, cooperative with exam HEENT: NCAT CV: RRR, good S1/S2, no murmur Resp: CTABL, no wheezes, non-labored Ext: BKA 2, left index finger amputated Neuro: Alert and oriented, No gross deficits  Skin Skin of the right hand is tight appearing, brisk capillary refill Third and fifth digits have wounds, third digit wound is on the tip and heavily heme crusted Fifth digit wound is on the extensor surface of the base of the nail with some yellowing, and some mild erythema of the right fifth digit.   Assessment and plan:  # Hand pain, PAD With nonhealing ulcers, I think this is likely due to peripheral arterial disease With some slight erythema of the right fifth digit and yellowing of the wound I have treated him with doxycycline, I do not believe there is underlying osteomyelitis at this time. Tramadol for pain, Norco caused too much sedation.  Recommended calling vascular surgery for an appointment soon as they can get him worked  in.      Meds ordered this encounter  Medications  . doxycycline (VIBRA-TABS) 100 MG tablet    Sig: Take 1 tablet (100 mg total) by mouth 2 (two) times daily. 1 po bid    Dispense:  20 tablet    Refill:  0  . traMADol (ULTRAM) 50 MG tablet    Sig: Take 1 tablet (50 mg total) by mouth every 12 (twelve) hours as needed.    Dispense:  30 tablet    Refill:  0    Murtis Sink, MD [facility] 07/31/2016, 11:58 AM

## 2016-08-01 NOTE — Telephone Encounter (Signed)
Changed an added the [place] I did not see that

## 2016-08-05 ENCOUNTER — Encounter: Payer: Self-pay | Admitting: Family

## 2016-08-05 ENCOUNTER — Ambulatory Visit: Payer: PPO | Admitting: Family

## 2016-08-05 ENCOUNTER — Ambulatory Visit (HOSPITAL_COMMUNITY)
Admission: RE | Admit: 2016-08-05 | Discharge: 2016-08-05 | Disposition: A | Discharge status: Home / Self Care | Payer: PPO | Source: Ambulatory Visit | Attending: Family | Admitting: Family

## 2016-08-05 ENCOUNTER — Ambulatory Visit (INDEPENDENT_AMBULATORY_CARE_PROVIDER_SITE_OTHER): Payer: PPO | Admitting: Family

## 2016-08-05 VITALS — BP 140/63 | HR 60 | Temp 97.2°F | Resp 16 | Ht 68.0 in | Wt 164.0 lb

## 2016-08-05 DIAGNOSIS — S6991XA Unspecified injury of right wrist, hand and finger(s), initial encounter: Secondary | ICD-10-CM | POA: Insufficient documentation

## 2016-08-05 DIAGNOSIS — X58XXXA Exposure to other specified factors, initial encounter: Secondary | ICD-10-CM | POA: Insufficient documentation

## 2016-08-05 DIAGNOSIS — Z48812 Encounter for surgical aftercare following surgery on the circulatory system: Secondary | ICD-10-CM | POA: Diagnosis not present

## 2016-08-05 DIAGNOSIS — M79602 Pain in left arm: Secondary | ICD-10-CM | POA: Diagnosis not present

## 2016-08-05 DIAGNOSIS — E1151 Type 2 diabetes mellitus with diabetic peripheral angiopathy without gangrene: Secondary | ICD-10-CM

## 2016-08-05 DIAGNOSIS — I998 Other disorder of circulatory system: Secondary | ICD-10-CM

## 2016-08-05 DIAGNOSIS — Z87891 Personal history of nicotine dependence: Secondary | ICD-10-CM

## 2016-08-05 DIAGNOSIS — I739 Peripheral vascular disease, unspecified: Secondary | ICD-10-CM | POA: Diagnosis not present

## 2016-08-05 DIAGNOSIS — I6523 Occlusion and stenosis of bilateral carotid arteries: Secondary | ICD-10-CM | POA: Diagnosis not present

## 2016-08-05 NOTE — Progress Notes (Signed)
[facility] [place]   CC: Follow up peripheral artery occlusive disease  History of Present Illness Christopher Padilla is a 76 y.o. male who is s/p a left brachial artery endarterectomy on 09-17-15. He had presented with progressive ischemia of the left upper extremity. His main issue after this procedure was swelling and Dr. Scot Dock did have to aspirate a lymphocele. He has had some occasional pain in his right arm. He has bilateral BKA's. He has had some neuropathic pain in his stumps.  He is on aspirin and is on a statin.  Pt was evaluated by Dr. Kenn File, his PCP, on 07-31-16. Patient had a fall on 06/26/2016, causing some abrasions to the third through fifth fingers in the right hand and a small avulsion fracture on the fifth digit. Patient states his fifth digit pain has improved from the beginning, however he continues to have deep-seated pain; he and his son state wounds seem to be healing slowly on those involved fingers. 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 Patient states that the pain hurts frequently. He tried hydrocodone for it, however he did not like the sedation and stopped trying it after one pill. Patient uses nortriptyline for neuropathy which works moderately well. With nonhealing ulcers, Dr. Wendi Snipes indicated in his visit note this is likely due to peripheral arterial disease With some slight erythema of the right fifth digit and yellowing of the wound, Dr. Wendi Snipes treated him with doxycycline and did not believe there was underlying osteomyelitis at that time. Tramadol for pain, Norco caused too much sedation.  Recommended calling vascular surgery for an appointment soon as they can get him worked in.  Dr. Scot Dock last evaluated pt on 05-01-16. At that time upper extremity arterial Doppler study demonstrated: On the left side, he has a triphasic subclavian signal with a  biphasic axillary signal. Below that he has monophasic signals. Digital pressure on the left is 67 mmHg. On the right side, he has a triphasic subclavian, axillary, and brachial signal. He has monophasic Doppler signals below that with a digital pressure of 51 mmHg. He had minimal symptoms in the left arm, was doing well status post endarterectomy with vein patch. He complained of some symptoms in his right arm but appeared to have distal disease and likely does not have any good options for revascularization in the right arm. Pt has a known 40-59% right carotid stenosis and will be due for a follow up carotid duplex scan in May 2018.  Pt Diabetic: Yes, last A1C was 10.7 on 07-02-16 (review of records) Pt smoker: former smoker, from 1956-1990  Pt meds include: Statin :Yes ASA: Yes Other anticoagulants/antiplatelets: no  Past Medical History:  Diagnosis Date  . Arthritis    "left hand" (08/21/2015)  . CAD (coronary artery disease)    S/P CABG, Feb 2003, LIMA to the LAD, left free radial artery to an obtuse marginal, spahenous vein graft to diagonal, saphenous vein graft to RCA with endarterectomy  . Cerebrovascular disease    MRA in 2005 with 75% stenosis in distal right vertebral artery and 75% stenosis greater in distal cervical internal carotid artery on teh right, severe bilateral disease and MRA of the extracranial circulation, high grade stenosis of the distal right internal carotid artery at the junction of the cervical internal carotid artery of the skull base  . CKD stage 3 due to type 2 diabetes mellitus ([place])   .  Collapsed lung   . Complete heart block ([place]) 05/14/2014   St[employer] Jude BiV PM (serial number [id number]) pacemaker  . GERD (gastroesophageal reflux disease)   . History of blood transfusion 2002   "after my heart attack"  . History of stomach ulcers 1960s  . History of tobacco use    Quit in 1990  . Hyperlipidemia   . Hypertension   . Hypothyroidism   . Myocardial  infarction 2002  . Osteopenia 2016  . Peripheral vascular disease ([place])    left hand  . PONV (postoperative nausea and vomiting)   . Presence of permanent cardiac pacemaker   . Type II diabetes mellitus ([place])     Social History Social History  Substance Use Topics  . Smoking status: Former Smoker    Packs/day: 1.00    Types: Cigarettes    Start date: 11/04/1954    Quit date: 05/06/1988  . Smokeless tobacco: Never Used  . Alcohol use No    Family History Family History  Problem Relation Age of Onset  . Cancer Mother     brain and lung  . Diabetes Father 96  . Coronary artery disease Father   . Diabetes Sister   . Diabetes Brother   . Diabetes Son   . Colon cancer Neg Hx     Past Surgical History:  Procedure Laterality Date  . AMPUTATION  10/17/2011   Procedure: AMPUTATION DIGIT;  Surgeon: Newt Minion, MD;  Location: [facility] [facility];  Service: Orthopedics;  Laterality: Left;  Amputation Left Index Finger at PIP Joint  . ARTERIAL BYPASS SURGRY     Left; left femoral to posterior tibial bypass gracting, left femoral artery and deep femoral artery endarterectomy with vein patch angioplasty of the common femoral arter and deep femoral artery 06/2005, right fremoral popliteal bypass, right iliac artery restent, bilateral below knee amputations  . BI-VENTRICULAR PACEMAKER INSERTION N/A 05/16/2014   Procedure: BI-VENTRICULAR PACEMAKER INSERTION (CRT-P);  Surgeon: Evans Lance, MD; St. Jude BiV PM (serial number [id number]) pacemaker  . CARDIAC CATHETERIZATION    . CARPAL TUNNEL RELEASE Right   . CATARACT EXTRACTION W/ INTRAOCULAR LENS  IMPLANT, BILATERAL Bilateral   . CORONARY ARTERY BYPASS GRAFT  2002   CABG X "4", By Ivin Poot, MD  . ENDARTERECTOMY Left 09/07/2015   Procedure: ENDARTERECTOMY LEFT  BRACHIAL ARTERY ;  Surgeon: Angelia Mould, MD;  Location: [facility];  Service: Vascular;  Laterality: Left;  . INSERT / REPLACE / REMOVE PACEMAKER    . LEFT HEART CATHETERIZATION WITH  CORONARY ANGIOGRAM N/A 05/10/2014   Procedure: LEFT HEART CATHETERIZATION WITH CORONARY ANGIOGRAM;  Surgeon: Burnell Blanks, MD;  Location[facility];  Service: Cardiovascular;  Laterality: N/A;  . LEG AMPUTATION BELOW KNEE Bilateral 2001-2004  . PATCH ANGIOPLASTY Left 09/07/2015   Procedure: LEFT BRACHIAL ARTERY PATCH ANGIOPLASTY USING XENOSURE BIOLOGIC PATCH;  Surgeon: Angelia Mould, MD;  Location: [facility];  Service: Vascular;  Laterality: Left;  . PERIPHERAL VASCULAR CATHETERIZATION N/A 08/21/2015   Procedure: Upper Extremity Angiography;  Surgeon: Angelia Mould, MD;  Location: [facility] CV LAB;  Service: Cardiovascular;  Laterality: N/A;  . PERIPHERAL VASCULAR CATHETERIZATION N/A 08/21/2015   Procedure: Aortic Arch Angiography;  Surgeon: Angelia Mould, MD;  Location: [facility] CV LAB;  Service: Cardiovascular;  Laterality: N/A;  . TEMPORARY PACEMAKER INSERTION N/A 05/14/2014   Procedure: TEMPORARY PACEMAKER INSERTION;  Surgeon: Blane Ohara, MD;  Location: [facility] CATH LAB;  Service: Cardiovascular;  Laterality:  N/A;  . THORACOTOMY     With drainage of a hemathroax and decortication of fibrothorax    Allergies  Allergen Reactions  . Codeine Other (See Comments)    hallucinations  . Ciprofloxacin Diarrhea  . Nsaids Other (See Comments)    GI upset    Current Outpatient Prescriptions  Medication Sig Dispense Refill  . ACCU-CHEK SOFTCLIX LANCETS lancets Check BG 4Xdaily DX E11.65 300 each 3  . aspirin 81 MG tablet Take 81 mg by mouth daily.      . Blood Glucose Monitoring Suppl (ACCU-CHEK AVIVA PLUS) W/DEVICE KIT Check BG 4XDaily DX E11.65 1 kit 0  . carvedilol (COREG) 25 MG tablet Take 1 tablet (25 mg total) by mouth 2 (two) times daily with a meal. 180 tablet 3  . cephALEXin (KEFLEX) 500 MG capsule     . Cholecalciferol (VITAMIN D PO) Take 1,000 Units by mouth daily.     Marland Kitchen doxycycline (VIBRA-TABS) 100 MG tablet Take 1 tablet (100 mg total) by mouth 2  (two) times daily. 1 po bid 20 tablet 0  . furosemide (LASIX) 40 MG tablet Take 1 tablet (40 mg total) by mouth daily. 90 tablet 3  . gabapentin (NEURONTIN) 600 MG tablet Take 1 tablet (600 mg total) by mouth 2 (two) times daily. 180 tablet 3  . glucose blood test strip Check BS QID and PRN 300 each 3  . HYDROcodone-acetaminophen (NORCO) 10-325 MG tablet     . insulin aspart (NOVOLOG) 100 UNIT/ML FlexPen Inject 1-10 Units into the skin 3 (three) times daily with meals. 30 mL 1  . Insulin Glargine (LANTUS SOLOSTAR) 100 UNIT/ML Solostar Pen Inject 32 Units into the skin daily at 10 pm. 10 pen 1  . Insulin Pen Needle (ADVOCATE INSULIN PEN NEEDLES) 33G X 4 MM MISC 1 each by Does not apply route 4 (four) times daily. Use with insulin pens qid 400 each 1  . levothyroxine (SYNTHROID, LEVOTHROID) 125 MCG tablet TAKE 1 TABLET (125 MCG TOTAL) BY MOUTH DAILY. 90 tablet 3  . mupirocin ointment (BACTROBAN) 2 % Place 1 application into the nose 2 (two) times daily. 22 g 0  . nortriptyline (PAMELOR) 25 MG capsule TAKE 1 CAPSULE (25 MG TOTAL) BY MOUTH AT BEDTIME. 90 capsule 3  . omeprazole (PRILOSEC) 20 MG capsule Take 1 capsule (20 mg total) by mouth every other day. 45 capsule 3  . simvastatin (ZOCOR) 40 MG tablet TAKE 1 TABLET (40 MG TOTAL) BY MOUTH AT BEDTIME. 90 tablet 3  . traMADol (ULTRAM) 50 MG tablet Take 1 tablet (50 mg total) by mouth every 12 (twelve) hours as needed. 30 tablet 0   No current facility-administered medications for this visit.    Facility-Administered Medications Ordered in Other Visits  Medication Dose Route Frequency Provider Last Rate Last Dose  . 0.9 %  sodium chloride infusion   Intravenous Continuous Angelia Mould, MD        ROS: See HPI for pertinent positives and negatives.   Physical Examination  Vitals:   08/05/16 1053  BP: 140/63  Pulse: 60  Resp: 16  Temp: 97.2 F (36.2 C)  TempSrc: Oral  SpO2: 95%  Weight: 164 lb (74.4 kg)  Height: 5' 8"  (1.727 m)    Body mass index is 24.94 kg/m.  General: A&O x 3, WDWN male. Gait: with bilateral BKA prostheses, using cane. Eyes: PERRLA. Pulmonary: Respirations are non labored, CTA in right posterior and bilateral anterior fields, + rales and diminished air movement  in left posterior fields Cardiac: regular rhythm, no detected murmur.         Carotid Bruits Right Left   Negative Positive  Aorta is not palpable. Radial pulses: not palpable bilaterally, right brachial is 1+, left brachial pulse is not palpable                          VASCULAR EXAM: Extremities with ischemic changes: eschar and shallow ulcerations right 5th and 3rd finger tips, no drainage.  without Gangrene; contracting small wound at tip of left index finger amputation site. Dorsal aspect of left hand is [place] (pt states it has been this way since the LUE surgery on 09-07-15).  Pt is wearing bilateral BKA prostheses.                                                                                                           LE Pulses Right Left       FEMORAL  not palpable   palpable        POPLITEAL  not palpable   not palpable       POSTERIOR TIBIAL  BKA   BKA       DORSALIS PEDIS      ANTERIOR TIBIAL BKA  BKA    Abdomen: soft, NT, no palpable masses. Skin: no rashes, See Extremities. Musculoskeletal: no muscle wasting or atrophy.  Neurologic: A&O X 3; Appropriate Affect ; SENSATION: normal; MOTOR FUNCTION:  moving all extremities equally, motor strength 5/5 throughout. Speech is fluent/normal. CN 2-12 intact.    Non-Invasive Vascular Imaging: DATE: 08/05/2016   Bilateral UE arterial duplex: Internal vessel narrowing due to thrombus in right upper arm in the brachial artery, radial artery, and ulnar artery. Highest velocity is at the right brachial mid artery: 256 cm/s. [place] triphasic waveforms in the right UE, biphasic at right distal radial artery, monophasic at right distal ulnar artery.  Left UE with all monophasic  waveforms except biphasic at subclavian, triphasic at axillary artery. Left UE with no evidence of stenosis.    ASSESSMENT: Christopher Padilla is a 76 y.o. male who is s/p left brachial artery endarterectomy on 09-17-15. He had presented with progressive ischemia of the left upper extremity. He fell and injured his left hand on 06/26/2016, causing some abrasions to the third through fifth fingers in the right hand and a small avulsion fracture on the fifth digit. He was evaluated and treated by his PCP, taking doxycycline. Pt and son indicate that the right fingertip wounds on 3-5th are slowly healing, but pain in the hand is an issue.   His atherosclerotic risk factors include uncontrolled DM, former smoker x 24 years (quit in 1990), CAD, and stage 3 CKD.  Last serum creatinine result on file was 1.56 on 04-01-16.  I advised pt to discuss with his PCP his recent cough and dyspnea, he has rales and diminished air movement in left posterior fields.   PLAN:  Based on the patient's vascular studies and examination, pt will return  to clinic at Dr. Nicole Cella next available clinic appointment (08-14-16) for re evaluation of right upper extremity vascular status.   I discussed in depth with the patient the nature of atherosclerosis, and emphasized the importance of maximal medical management including strict control of blood pressure, blood glucose, and lipid levels, obtaining regular exercise, and continued cessation of smoking.  The patient is aware that without maximal medical management the underlying atherosclerotic disease process will progress, limiting the benefit of any interventions.  The patient was given information about PAD including signs, symptoms, treatment, what symptoms should prompt the patient to seek immediate medical care, and risk reduction measures to take.  Clemon Chambers, RN, MSN, FNP-C [facility] Phone: [phone]  Clinic MD: Early on  call  08/05/16 11:13 AM

## 2016-08-05 NOTE — Patient Instructions (Signed)
Peripheral Vascular Disease Peripheral vascular disease (PVD) is a disease of the blood vessels that are not part of your heart and brain. A simple term for PVD is poor circulation. In most cases, PVD narrows the blood vessels that carry blood from your heart to the rest of your body. This can result in a decreased supply of blood to your arms, legs, and internal organs, like your stomach or kidneys. However, it most often affects a person's lower legs and feet. There are two types of PVD.  Organic PVD. This is the more common type. It is caused by damage to the structure of blood vessels.  Functional PVD. This is caused by conditions that make blood vessels contract and tighten (spasm). Without treatment, PVD tends to get worse over time. PVD can also lead to acute ischemic limb. This is when an arm or limb suddenly has trouble getting enough blood. This is a medical emergency. Follow these instructions at home:  Take medicines only as told by your doctor.  Do not use any tobacco products, including cigarettes, chewing tobacco, or electronic cigarettes. If you need help quitting, ask your doctor.  Lose weight if you are overweight, and maintain a healthy weight as told by your doctor.  Eat a diet that is low in fat and cholesterol. If you need help, ask your doctor.  Exercise regularly. Ask your doctor for some good activities for you.  Take good care of your feet.  Wear comfortable shoes that fit well.  Check your feet often for any cuts or sores. Contact a doctor if:  You have cramps in your legs while walking.  You have leg pain when you are at rest.  You have coldness in a leg or foot.  Your skin changes.  You are unable to get or have an erection (erectile dysfunction).  You have cuts or sores on your feet that are not healing. Get help right away if:  Your arm or leg turns cold and blue.  Your arms or legs become red, warm, swollen, painful, or numb.  You have  chest pain or trouble breathing.  You suddenly have weakness in your face, arm, or leg.  You become very confused or you cannot speak.  You suddenly have a very bad headache.  You suddenly cannot see. This information is not intended to replace advice given to you by your health care provider. Make sure you discuss any questions you have with your health care provider. Document Released: 07/17/2009 Document Revised: 09/28/2015 Document Reviewed: 09/30/2013 Elsevier Interactive Patient Education  2017 Elsevier Inc.    Stroke Prevention Some medical conditions and behaviors are associated with an increased chance of having a stroke. You may prevent a stroke by making healthy choices and managing medical conditions. How can I reduce my risk of having a stroke?  Stay physically active. Get at least 30 minutes of activity on most or all days.  Do not smoke. It may also be helpful to avoid exposure to secondhand smoke.  Limit alcohol use. Moderate alcohol use is considered to be:  No more than 2 drinks per day for men.  No more than 1 drink per day for nonpregnant women.  Eat healthy foods. This involves:  Eating 5 or more servings of fruits and vegetables a day.  Making dietary changes that address high blood pressure (hypertension), high cholesterol, diabetes, or obesity.  Manage your cholesterol levels.  Making food choices that are high in fiber and low in saturated fat,   trans fat, and cholesterol may control cholesterol levels.  Take any prescribed medicines to control cholesterol as directed by your health care provider.  Manage your diabetes.  Controlling your carbohydrate and sugar intake is recommended to manage diabetes.  Take any prescribed medicines to control diabetes as directed by your health care provider.  Control your hypertension.  Making food choices that are low in salt (sodium), saturated fat, trans fat, and cholesterol is recommended to manage  hypertension.  Ask your health care provider if you need treatment to lower your blood pressure. Take any prescribed medicines to control hypertension as directed by your health care provider.  If you are 18-39 years of age, have your blood pressure checked every 3-5 years. If you are 40 years of age or older, have your blood pressure checked every year.  Maintain a healthy weight.  Reducing calorie intake and making food choices that are low in sodium, saturated fat, trans fat, and cholesterol are recommended to manage weight.  Stop drug abuse.  Avoid taking birth control pills.  Talk to your health care provider about the risks of taking birth control pills if you are over 35 years old, smoke, get migraines, or have ever had a blood clot.  Get evaluated for sleep disorders (sleep apnea).  Talk to your health care provider about getting a sleep evaluation if you snore a lot or have excessive sleepiness.  Take medicines only as directed by your health care provider.  For some people, aspirin or blood thinners (anticoagulants) are helpful in reducing the risk of forming abnormal blood clots that can lead to stroke. If you have the irregular heart rhythm of atrial fibrillation, you should be on a blood thinner unless there is a good reason you cannot take them.  Understand all your medicine instructions.  Make sure that other conditions (such as anemia or atherosclerosis) are addressed. Get help right away if:  You have sudden weakness or numbness of the face, arm, or leg, especially on one side of the body.  Your face or eyelid droops to one side.  You have sudden confusion.  You have trouble speaking (aphasia) or understanding.  You have sudden trouble seeing in one or both eyes.  You have sudden trouble walking.  You have dizziness.  You have a loss of balance or coordination.  You have a sudden, severe headache with no known cause.  You have new chest pain or an  irregular heartbeat. Any of these symptoms may represent a serious problem that is an emergency. Do not wait to see if the symptoms will go away. Get medical help at once. Call your local emergency services (911 in [place] Do not drive yourself to the hospital. This information is not intended to replace advice given to you by your health care provider. Make sure you discuss any questions you have with your health care provider. Document Released: 05/30/2004 Document Revised: 09/28/2015 Document Reviewed: 10/23/2012 Elsevier Interactive Patient Education  2017 Elsevier Inc.   

## 2016-08-06 ENCOUNTER — Encounter: Payer: Self-pay | Admitting: Vascular Surgery

## 2016-08-14 ENCOUNTER — Ambulatory Visit: Payer: PPO | Admitting: Vascular Surgery

## 2016-08-19 ENCOUNTER — Encounter: Payer: Self-pay | Admitting: Pharmacist

## 2016-08-19 ENCOUNTER — Ambulatory Visit (INDEPENDENT_AMBULATORY_CARE_PROVIDER_SITE_OTHER): Payer: PPO | Admitting: Pharmacist

## 2016-08-19 VITALS — BP 110/60 | Ht 68.0 in | Wt 163.0 lb

## 2016-08-19 DIAGNOSIS — Z794 Long term (current) use of insulin: Secondary | ICD-10-CM | POA: Diagnosis not present

## 2016-08-19 DIAGNOSIS — N183 Chronic kidney disease, stage 3 (moderate): Secondary | ICD-10-CM

## 2016-08-19 DIAGNOSIS — E1122 Type 2 diabetes mellitus with diabetic chronic kidney disease: Secondary | ICD-10-CM | POA: Diagnosis not present

## 2016-08-19 DIAGNOSIS — E1165 Type 2 diabetes mellitus with hyperglycemia: Secondary | ICD-10-CM

## 2016-08-19 DIAGNOSIS — IMO0002 Reserved for concepts with insufficient information to code with codable children: Secondary | ICD-10-CM

## 2016-08-19 MED ORDER — INSULIN PEN NEEDLE 33G X 4 MM MISC: 1.0000 | Freq: Four times a day (QID) | 1 refills | Status: AC

## 2016-08-19 MED ORDER — INSULIN GLARGINE 100 UNIT/ML SOLOSTAR PEN: 30.0000 [IU] | PEN_INJECTOR | Freq: Every day | SUBCUTANEOUS | 1 refills | Status: AC

## 2016-08-19 NOTE — Patient Instructions (Signed)
Decrease Lantus to 30 units at bedtime.   If you do not have bedtime snack decrease Lantus to 20 units.   Call office if you have a blood glucose reading less than 80.   Diabetes and Standards of Medical Care   Diabetes is complicated. You may find that your diabetes team includes a dietitian, nurse, diabetes educator, eye doctor, and more. To help everyone know what is going on and to help you get the care you deserve, the following schedule of care was developed to help keep you on track. Below are the tests, exams, vaccines, medicines, education, and plans you will need.  Blood Glucose Goals Prior to meals = 80 - 130 Within 2 hours of the start of a meal = less than 180  HbA1c test (goal is less than 7.0% - your last value was 10.7%) This test shows how well you have controlled your glucose over the past 2 to 3 months. It is used to see if your diabetes management plan needs to be adjusted.   It is performed at least 2 times a year if you are meeting treatment goals.  It is performed 4 times a year if therapy has changed or if you are not meeting treatment goals.  Blood pressure test  This test is performed at every routine medical visit. The goal is less than 140/90 mmHg for most people, but 130/80 mmHg in some cases. Ask your health care provider about your goal.  Dental exam  Follow up with the dentist regularly.  Eye exam  If you are diagnosed with type 1 diabetes as a child, get an exam upon reaching the age of 87 years or older and have had diabetes for 3 to 5 years. Yearly eye exams are recommended after that initial eye exam.  If you are diagnosed with type 1 diabetes as an adult, get an exam within 5 years of diagnosis and then yearly.  If you are diagnosed with type 2 diabetes, get an exam as soon as possible after the diagnosis and then yearly.  Foot care exam  Visual foot exams are performed at every routine medical visit. The exams check for cuts, injuries, or  other problems with the feet.  A comprehensive foot exam should be done yearly. This includes visual inspection as well as assessing foot pulses and testing for loss of sensation.  Check your feet nightly for cuts, injuries, or other problems with your feet. Tell your health care provider if anything is not healing.  Kidney function test (urine microalbumin)  This test is performed once a year.  Type 1 diabetes: The first test is performed 5 years after diagnosis.  Type 2 diabetes: The first test is performed at the time of diagnosis.  A serum creatinine and estimated glomerular filtration rate (eGFR) test is done once a year to assess the level of chronic kidney disease (CKD), if present.  Lipid profile (cholesterol, HDL, LDL, triglycerides)  Performed every 5 years for most people.  The goal for LDL is less than 100 mg/dL. If you are at high risk, the goal is less than 70 mg/dL.  The goal for HDL is 40 mg/dL to 50 mg/dL for men and 50 mg/dL to 60 mg/dL for women. An HDL cholesterol of 60 mg/dL or higher gives some protection against heart disease.  The goal for triglycerides is less than 150 mg/dL.  Influenza vaccine, pneumococcal vaccine, and hepatitis B vaccine  The influenza vaccine is recommended yearly.  The pneumococcal  vaccine is generally given once in a lifetime. However, there are some instances when another vaccination is recommended. Check with your health care provider.  The hepatitis B vaccine is also recommended for adults with diabetes.  Diabetes self-management education  Education is recommended at diagnosis and ongoing as needed.  Treatment plan  Your treatment plan is reviewed at every medical visit.  Document Released: 02/17/2009 Document Revised: 12/23/2012 Document Reviewed: 09/22/2012 [facility] Patient Information 2014 [place].

## 2016-08-19 NOTE — Progress Notes (Signed)
Patient ID: Christopher Padilla, male   DOB: August 14, 1940, 76 y.o.   MRN: [id number]    Subjective:    Christopher Padilla is a 76 y.o. male  Referred by his PCP Dr Rudi Heap for insulin adjustment and diabetes education.  Mr. Pulsifer has insulin dependent DM, currently is uncontrolled.   The patient was initially diagnosed with Type 2 diabetes mellitus around 76 yo or about 37 years ago.  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  He has done a better job of following sliding scale since our last adjustment.    1 serving of carbohydrate containing food 2 or 3 servings of carbohydrate containing foods 4 or more serving of carbohydrate containing foods  1 units of Humalog insulin 3 unit of Humalog insulin 5 units of Humalog insulin     Blood glucose  Amount of Humalog insulin   Less than 70 Hold Humalog,  treat low blood glucose with "quick sugar"  and recheck blood glucose in 15 to 20 minutes  70 to 100 none  101 to 120 2 units  121 to 140 4 units  141 to 160 6 units  161 to 180 7 units  181 to 200 8 units  201 or over 10 units        Current monitoring regimen: home blood tests - 2-3 times daily  - patient only checks left hand due to decreased dexterity of left hand (loss of pointer finger on left hand).  He did not bring glucometer today Highest BG reading - 260 Any episodes of hypoglycemia? Yes - two events.  Both occurred in the morning about 7am.  Had a low today that required him to call his sons because he fell.  He did not get to check BG prior to treating low BG.  He also had another low in the  morning when BG was 57.   Patient states that last night he did not eat his usual bedtime snack and only gave 25 units of insulin.  He still has a low. He also reports that he ate less than usual last pm for supper / dinner.  Injects insulin in abdomen or upper thigh.  Uses vials and syringes. I sent in Rx for pens for easier administration, however patient has not picked up yet because he still had syringes and vials. Self administers insulin.  Is He on ACE inhibitor or angiotensin II receptor blocker?  No - Ramipril was discontinued around 04/2014 - possibly related to increase serum creatinine which has since decreased and is currently stable.   The following portions of the patient's history were reviewed and updated as appropriate: allergies and current medications.   Objective:    BP 110/60 (BP Location: Right Arm, Patient Position: Sitting, Cuff Size: Normal)   Ht 5\' 8"  (1.727 m)   Wt 163 lb (73.9 kg)   BMI 24.78 kg/m   Lab Review Glucose (mg/dL)  Date Value  78/29/5621 180 (H)  01/04/2016 112 (H)  12/20/2015 243 (H)   Glucose, Bld (mg/dL)  Date Value  30/86/5784 301 (H)  09/07/2015 141 (H)  08/21/2015 207 (H)   CO2 (mmol/L)  Date Value  04/01/2016 28  01/04/2016 25  12/20/2015 27  BUN (mg/dL)  Date Value  63/81/7711 28 (H)  01/04/2016 27  12/20/2015 20   Creat (mg/dL)  Date Value  65/79/0383 1.22   Creatinine, Ser (mg/dL)  Date Value  33/83/2919 1.56 (H)  01/04/2016 1.71 (H)  12/20/2015 1.50 (H)      Assessment:    Diabetes Mellitus type II, under inadequate control with reports of both hypo and hyperglycemia  HTN - controlled   Plan:    1.  Rx changes:    Decrease lantus to 30 units daily  Patient is instructed to continue Novolog dosing per below:    1 serving of carbohydrate containing food 2 or 3 servings of carbohydrate containing foods 4 or more serving of carbohydrate containing foods  1 units of Humalog insulin 3 unit of  Humalog insulin 5 units of Humalog insulin     Blood glucose  Amount of Humalog insulin   Less than 70 Hold Humalog,  treat low blood glucose with "quick sugar"  and recheck blood glucose in 15 to 20 minutes  70 to 100 none  101 to 120 2 units  121 to 140 4 units  141 to 160 6 units  161 to 180 7 units  181 to 200 8 units  201 or over 10 units        RTC to see PCP in 4 weeks, will see me in 8 weeks     Henrene Pastor, PharmD, CPP, CDE

## 2016-08-21 ENCOUNTER — Ambulatory Visit (INDEPENDENT_AMBULATORY_CARE_PROVIDER_SITE_OTHER): Payer: PPO | Admitting: Vascular Surgery

## 2016-08-21 ENCOUNTER — Encounter: Payer: Self-pay | Admitting: Vascular Surgery

## 2016-08-21 VITALS — BP 130/69 | HR 74 | Temp 97.4°F | Resp 16 | Ht 68.0 in | Wt 162.0 lb

## 2016-08-21 DIAGNOSIS — E1151 Type 2 diabetes mellitus with diabetic peripheral angiopathy without gangrene: Secondary | ICD-10-CM | POA: Diagnosis not present

## 2016-08-21 MED ORDER — CEPHALEXIN 500 MG PO CAPS: 500.0000 mg | ORAL_CAPSULE | Freq: Three times a day (TID) | ORAL | 1 refills | Status: AC

## 2016-08-21 NOTE — Progress Notes (Signed)
Patient name: Christopher Padilla MRN: [id number] DOB: 02/19/1941 Sex: male  REASON FOR VISIT: Follow up of upper extremity arterial occlusive disease  HPI: Christopher Padilla is a 76 y.o. male who I last saw on 05/01/2016. The patient is undergone a previous left brachial artery endarterectomy and presented with progressive ischemia of the left upper extremity. At the time of his last visit with me, he had a triphasic subclavian signal with biphasic axillary signal. He had monophasic signals below that with a digital pressure of 67 mmHg. On the right side he had a triphasic subclavian, axillary, and brachial signal with monophasic signals below that and a digital pressure of 51 mmHg. Carotid duplex scan in May 2017 showed a 40-59% right carotid stenosis with no significant stenosis on the left.  He comes in today for follow up. His main concern is a small wound on the distal aspect of his right middle finger. In addition he said his left index finger amputated and has a small wound on the tip of this. He is not a smoker. He denies any significant pain in his upper extremities. He is continuing to ambulate with his prostheses but is somewhat unsteady.  He has bilateral BKA's and walks with prostheses.  Past Medical History:  Diagnosis Date  . Arthritis    "left hand" (08/21/2015)  . CAD (coronary artery disease)    S/P CABG, Feb 2003, LIMA to the LAD, left free radial artery to an obtuse marginal, spahenous vein graft to diagonal, saphenous vein graft to RCA with endarterectomy  . Cerebrovascular disease    MRA in 2005 with 75% stenosis in distal right vertebral artery and 75% stenosis greater in distal cervical internal carotid artery on teh right, severe bilateral disease and MRA of the extracranial circulation, high grade stenosis of the distal right internal carotid artery at the junction of the cervical internal carotid artery of the skull base  . CKD stage 3 due to type 2 diabetes mellitus ([name])    . Collapsed lung   . Complete heart block ([name]) 05/14/2014   [employer]. Jude BiV PM (serial number [id number]) pacemaker  . GERD (gastroesophageal reflux disease)   . History of blood transfusion 2002   "after my heart attack"  . History of stomach ulcers 1960s  . History of tobacco use    Quit in 1990  . Hyperlipidemia   . Hypertension   . Hypothyroidism   . Myocardial infarction ([name]) 2002  . Osteopenia 2016  . Peripheral vascular disease ([place])    left hand  . PONV (postoperative nausea and vomiting)   . Presence of permanent cardiac pacemaker   . Type II diabetes mellitus ([facility])     Family History  Problem Relation Age of Onset  . Cancer Mother     brain and lung  . Diabetes Father 108  . Coronary artery disease Father   . Diabetes Sister   . Diabetes Brother   . Diabetes Son   . Colon cancer Neg Hx     SOCIAL HISTORY: Social History  Substance Use Topics  . Smoking status: Former Smoker    Packs/day: 1.00    Types: Cigarettes    Start date: 11/04/1954    Quit date: 05/06/1988  . Smokeless tobacco: Never Used  . Alcohol use No    Allergies  Allergen Reactions  . Codeine Other (See Comments)    hallucinations  . Ciprofloxacin Diarrhea  . Nsaids Other (See Comments)    GI upset  Current Outpatient Prescriptions  Medication Sig Dispense Refill  . ACCU-CHEK SOFTCLIX LANCETS lancets Check BG 4Xdaily DX E11.65 300 each 3  . aspirin 81 MG tablet Take 81 mg by mouth daily.      . Blood Glucose Monitoring Suppl (ACCU-CHEK AVIVA PLUS) W/DEVICE KIT Check BG 4XDaily DX E11.65 1 kit 0  . carvedilol (COREG) 25 MG tablet Take 1 tablet (25 mg total) by mouth 2 (two) times daily with a meal. 180 tablet 3  . Cholecalciferol (VITAMIN D PO) Take 1,000 Units by mouth daily.     . furosemide (LASIX) 40 MG tablet Take 1 tablet (40 mg total) by mouth daily. 90 tablet 3  . gabapentin (NEURONTIN) 600 MG tablet Take 1 tablet (600 mg total) by mouth 2 (two) times daily. 180 tablet 3  .  glucose blood test strip Check BS QID and PRN 300 each 3  . insulin aspart (NOVOLOG) 100 UNIT/ML FlexPen Inject 1-10 Units into the skin 3 (three) times daily with meals. 30 mL 1  . Insulin Glargine (LANTUS SOLOSTAR) 100 UNIT/ML Solostar Pen Inject 30-32 Units into the skin daily at 10 pm. 10 pen 1  . Insulin Pen Needle (ADVOCATE INSULIN PEN NEEDLES) 33G X 4 MM MISC 1 each by Does not apply route 4 (four) times daily. Use with insulin pens qid 400 each 1  . levothyroxine (SYNTHROID, LEVOTHROID) 125 MCG tablet TAKE 1 TABLET (125 MCG TOTAL) BY MOUTH DAILY. 90 tablet 3  . mupirocin ointment (BACTROBAN) 2 % Place 1 application into the nose 2 (two) times daily. 22 g 0  . nortriptyline (PAMELOR) 25 MG capsule TAKE 1 CAPSULE (25 MG TOTAL) BY MOUTH AT BEDTIME. 90 capsule 3  . omeprazole (PRILOSEC) 20 MG capsule Take 1 capsule (20 mg total) by mouth every other day. 45 capsule 3  . simvastatin (ZOCOR) 40 MG tablet TAKE 1 TABLET (40 MG TOTAL) BY MOUTH AT BEDTIME. 90 tablet 3  . traMADol (ULTRAM) 50 MG tablet Take 1 tablet (50 mg total) by mouth every 12 (twelve) hours as needed. 30 tablet 0   No current facility-administered medications for this visit.    Facility-Administered Medications Ordered in Other Visits  Medication Dose Route Frequency Provider Last Rate Last Dose  . 0.9 %  sodium chloride infusion   Intravenous Continuous Angelia Mould, MD        REVIEW OF SYSTEMS:  [X]  denotes positive finding, [ ]  denotes negative finding Cardiac  Comments:  Chest pain or chest pressure:    Shortness of breath upon exertion:    Short of breath when lying flat:    Irregular heart rhythm:        Vascular    Pain in calf, thigh, or hip brought on by ambulation:    Pain in feet at night that wakes you up from your sleep:     Blood clot in your veins:    Leg swelling:         Pulmonary    Oxygen at home:    Productive cough:     Wheezing:         Neurologic    Sudden weakness in arms or  legs:     Sudden numbness in arms or legs:     Sudden onset of difficulty speaking or slurred speech:    Temporary loss of vision in one eye:     Problems with dizziness:         Gastrointestinal    Blood in  stool:     Vomited blood:         Genitourinary    Burning when urinating:     Blood in urine:        Psychiatric    Major depression:         Hematologic    Bleeding problems:    Problems with blood clotting too easily:        Skin    Rashes or ulcers:        Constitutional    Fever or chills:      PHYSICAL EXAM: Vitals:   08/21/16 1306  BP: 130/69  Pulse: 74  Resp: 16  Temp: 97.4 F (36.3 C)  TempSrc: Oral  SpO2: 90%  Weight: 162 lb (73.5 kg)  Height: 5' 8"  (1.727 m)    GENERAL: The patient is a well-nourished male, in no acute distress. The vital signs are documented above. CARDIAC: There is a regular rate and rhythm.  VASCULAR: I cannot palpate radial pulses. He does have a fairly brisk radial and ulnar signal on the right and a fairly brisk radial signal on the left with a dampened ulnar signal on the left. PULMONARY: There is good air exchange bilaterally without wheezing or rales. ABDOMEN: Soft and non-tender with normal pitched bowel sounds.  MUSCULOSKELETAL: He has bilateral BKA's. NEUROLOGIC: No focal weakness or paresthesias are detected. SKIN: He has some very mild cellulitis under the fingernail bed of his right middle finger. PSYCHIATRIC: The patient has a normal affect.   DATA: His most recent GFR was 42 in November 2017.  UPPER EXTREMITY ARTERIAL DUPLEX: I reviewed his duplex from 56-18. On the right side, he has a triphasic subclavian, axillary, brachial, proximal radial and proximal ulnar signal. He has a biphasic distal radial signal and a monophasic distal ulnar signal.  On the left side he has a biphasic subclavian and axillary signal with monophasic signals below that.  MEDICAL ISSUES:  BILATERAL UPPER EXTREMITY ARTERIAL OCCLUSIVE  DISEASE: Baseline his previous arteriogram he did have distal disease with no good options for revascularization on the right. Given that he has a wound on his right hand ideally like to repeat his arteriogram, however he has chronic kidney disease and I do not want to risk putting him on dialysis. I have written him a prescription for Keflex given the mild cellulitis of the right middle finger. I'll continue to follow the wound on this right middle finger and on the left index amputation site. He is due to come back in late May for carotid duplex scan of LC him at that time. He knows to call sooner if he has problems.   Deitra Mayo [facility] [phone]

## 2016-08-29 ENCOUNTER — Emergency Department (HOSPITAL_COMMUNITY): Payer: PPO

## 2016-08-29 ENCOUNTER — Encounter (HOSPITAL_COMMUNITY): Payer: Self-pay | Admitting: *Deleted

## 2016-08-29 ENCOUNTER — Emergency Department (HOSPITAL_COMMUNITY)
Admission: EM | Admit: 2016-08-29 | Discharge: 2016-09-03 | Disposition: E | Payer: PPO | Attending: Emergency Medicine | Admitting: Emergency Medicine

## 2016-08-29 DIAGNOSIS — I2510 Atherosclerotic heart disease of native coronary artery without angina pectoris: Secondary | ICD-10-CM | POA: Diagnosis not present

## 2016-08-29 DIAGNOSIS — E1122 Type 2 diabetes mellitus with diabetic chronic kidney disease: Secondary | ICD-10-CM | POA: Insufficient documentation

## 2016-08-29 DIAGNOSIS — I130 Hypertensive heart and chronic kidney disease with heart failure and stage 1 through stage 4 chronic kidney disease, or unspecified chronic kidney disease: Secondary | ICD-10-CM | POA: Insufficient documentation

## 2016-08-29 DIAGNOSIS — E11649 Type 2 diabetes mellitus with hypoglycemia without coma: Secondary | ICD-10-CM | POA: Insufficient documentation

## 2016-08-29 DIAGNOSIS — I482 Chronic atrial fibrillation: Secondary | ICD-10-CM | POA: Diagnosis not present

## 2016-08-29 DIAGNOSIS — Z79899 Other long term (current) drug therapy: Secondary | ICD-10-CM | POA: Insufficient documentation

## 2016-08-29 DIAGNOSIS — Z87891 Personal history of nicotine dependence: Secondary | ICD-10-CM | POA: Insufficient documentation

## 2016-08-29 DIAGNOSIS — Z951 Presence of aortocoronary bypass graft: Secondary | ICD-10-CM | POA: Insufficient documentation

## 2016-08-29 DIAGNOSIS — Z7982 Long term (current) use of aspirin: Secondary | ICD-10-CM | POA: Diagnosis not present

## 2016-08-29 DIAGNOSIS — I509 Heart failure, unspecified: Secondary | ICD-10-CM | POA: Diagnosis not present

## 2016-08-29 DIAGNOSIS — R404 Transient alteration of awareness: Secondary | ICD-10-CM | POA: Diagnosis not present

## 2016-08-29 DIAGNOSIS — R0682 Tachypnea, not elsewhere classified: Secondary | ICD-10-CM | POA: Diagnosis not present

## 2016-08-29 DIAGNOSIS — E039 Hypothyroidism, unspecified: Secondary | ICD-10-CM | POA: Diagnosis not present

## 2016-08-29 DIAGNOSIS — I517 Cardiomegaly: Secondary | ICD-10-CM | POA: Diagnosis not present

## 2016-08-29 DIAGNOSIS — N183 Chronic kidney disease, stage 3 (moderate): Secondary | ICD-10-CM | POA: Diagnosis not present

## 2016-08-29 DIAGNOSIS — Z794 Long term (current) use of insulin: Secondary | ICD-10-CM | POA: Insufficient documentation

## 2016-08-29 DIAGNOSIS — R4182 Altered mental status, unspecified: Secondary | ICD-10-CM

## 2016-08-29 DIAGNOSIS — E162 Hypoglycemia, unspecified: Secondary | ICD-10-CM

## 2016-08-29 DIAGNOSIS — R0989 Other specified symptoms and signs involving the circulatory and respiratory systems: Secondary | ICD-10-CM | POA: Diagnosis not present

## 2016-08-29 DIAGNOSIS — R4020 Unspecified coma: Secondary | ICD-10-CM | POA: Diagnosis not present

## 2016-08-29 LAB — CBC WITH DIFFERENTIAL/PLATELET
BASOS ABS: 0 10*3/uL (ref 0.0–0.1)
Basophils Relative: 0 %
EOS ABS: 0 10*3/uL (ref 0.0–0.7)
EOS PCT: 0 %
HCT: 42.1 % (ref 39.0–52.0)
HEMOGLOBIN: 13.9 g/dL (ref 13.0–17.0)
LYMPHS ABS: 0.7 10*3/uL (ref 0.7–4.0)
LYMPHS PCT: 9 %
MCH: 30.8 pg (ref 26.0–34.0)
MCHC: 33 g/dL (ref 30.0–36.0)
MCV: 93.1 fL (ref 78.0–100.0)
Monocytes Absolute: 0.8 10*3/uL (ref 0.1–1.0)
Monocytes Relative: 9 %
NEUTROS PCT: 82 %
Neutro Abs: 7.1 10*3/uL (ref 1.7–7.7)
PLATELETS: 114 10*3/uL — AB (ref 150–400)
RBC: 4.52 MIL/uL (ref 4.22–5.81)
RDW: 15.2 % (ref 11.5–15.5)
WBC: 8.6 10*3/uL (ref 4.0–10.5)

## 2016-08-29 LAB — COMPREHENSIVE METABOLIC PANEL
ALK PHOS: 245 U/L — AB (ref 38–126)
ALT: 28 U/L (ref 17–63)
AST: 48 U/L — AB (ref 15–41)
Albumin: 3.2 g/dL — ABNORMAL LOW (ref 3.5–5.0)
Anion gap: 11 (ref 5–15)
BUN: 32 mg/dL — AB (ref 6–20)
CALCIUM: 8.1 mg/dL — AB (ref 8.9–10.3)
CHLORIDE: 95 mmol/L — AB (ref 101–111)
CO2: 26 mmol/L (ref 22–32)
CREATININE: 1.46 mg/dL — AB (ref 0.61–1.24)
GFR calc Af Amer: 52 mL/min — ABNORMAL LOW (ref >60)
GFR calc non Af Amer: 45 mL/min — ABNORMAL LOW (ref >60)
GLUCOSE: 170 mg/dL — AB (ref 65–99)
Potassium: 4.8 mmol/L (ref 3.5–5.1)
Sodium: 132 mmol/L — ABNORMAL LOW (ref 135–145)
Total Bilirubin: 2.4 mg/dL — ABNORMAL HIGH (ref 0.3–1.2)
Total Protein: 7.2 g/dL (ref 6.5–8.1)

## 2016-08-29 LAB — CBG MONITORING, ED
GLUCOSE-CAPILLARY: 159 mg/dL — AB (ref 65–99)
Glucose-Capillary: 90 mg/dL (ref 65–99)

## 2016-08-29 LAB — TROPONIN I: Troponin I: 0.05 ng/mL (ref <0.03)

## 2016-08-29 LAB — LACTIC ACID, PLASMA: Lactic Acid, Venous: 3.1 mmol/L (ref 0.5–1.9)

## 2016-08-29 MED ORDER — DEXTROSE 50 % IV SOLN
INTRAVENOUS | Status: AC
  Filled 2016-08-29: qty 50

## 2016-08-29 MED ORDER — DEXTROSE 5 % IV SOLN
500.0000 mg | INTRAVENOUS | Status: DC
  Administered 2016-08-29: 500 mg via INTRAVENOUS
  Filled 2016-08-29: qty 500

## 2016-08-29 MED ORDER — DEXTROSE 50 % IV SOLN
1.0000 | Freq: Once | INTRAVENOUS | Status: AC
Start: 2016-08-29 — End: 2016-08-29
  Administered 2016-08-29: 50 mL via INTRAVENOUS

## 2016-08-29 MED ORDER — IPRATROPIUM-ALBUTEROL 0.5-2.5 (3) MG/3ML IN SOLN
3.0000 mL | Freq: Once | RESPIRATORY_TRACT | Status: AC
  Administered 2016-08-29: 3 mL via RESPIRATORY_TRACT
  Filled 2016-08-29: qty 3

## 2016-08-29 MED ORDER — DEXTROSE 5 % IV SOLN
1.0000 g | Freq: Once | INTRAVENOUS | Status: AC
  Administered 2016-08-29: 1 g via INTRAVENOUS
  Filled 2016-08-29: qty 10

## 2016-08-29 MED ORDER — LORAZEPAM 2 MG/ML IJ SOLN
2.0000 mg | Freq: Once | INTRAMUSCULAR | Status: AC
  Administered 2016-08-29: 2 mg via INTRAVENOUS
  Filled 2016-08-29: qty 1

## 2016-08-29 MED ORDER — SODIUM CHLORIDE 0.9 % IV BOLUS (SEPSIS)
1000.0000 mL | Freq: Once | INTRAVENOUS | Status: AC
  Administered 2016-08-29: 1000 mL via INTRAVENOUS

## 2016-08-29 NOTE — ED Notes (Signed)
Called family left message to return

## 2016-08-29 NOTE — ED Notes (Signed)
Pt bearing down, placed on non rebreather mask, stopped breathing. MD in room at this time,

## 2016-08-29 NOTE — ED Triage Notes (Signed)
Pt arrived by EMS from home, called out for unresponsive. Last time know normal was 1100 this morning. Pt w/ raspy respiration, eyes open, no verbal response.

## 2016-08-29 NOTE — Progress Notes (Signed)
Called by Dr Adriana Simas to admit this patient, 76 yo found with severe hypoglycemia, BS 20's, unclear duration.  In the ER, he remained unconscious even after IV D50 given.  His CBG was now at 150.  When I came to admit him, he was agitated and hypoxic, and tighten his whole body, as if he was having a seizure.  He was given 100 percent oxygen, and 2mg  IV of ativan.  His rhythm was paced.  He subsequently became apneic, and since he is a DNR, no intubation was performed.  He was pronouced at 22:05 by RN.  His son was informed and his brother came to see him.  Houston Siren MD FACP. Hospitalist.

## 2016-08-29 NOTE — ED Provider Notes (Signed)
[facility] DEPT Provider Note   CSN: [id number] Arrival date & time: 08/04/2016  1922     History   Chief Complaint Chief Complaint  Patient presents with  . Unresponsive    HPI Christopher Padilla is a 76 y.o. male.  Level V caveat for altered mental status. History obtained from EMS and wife. Patient has not felt well for the past 1-2 days. He was last seen normal approximately 11 AM today. At 3 PM he woke up briefly but did not speak to his wife. He was found unconscious at approximately 6:30 PM. EMS was notified. His glucose was in the teens. Intravenous glucose was administered. On presentation to the emergency department, he continued to have altered mental status and no purposeful movement of his arms and legs. His eyes were open but he did not speak.      Past Medical History:  Diagnosis Date  . Arthritis    "left hand" (08/21/2015)  . CAD (coronary artery disease)    S/P CABG, Feb 2003, LIMA to the LAD, left free radial artery to an obtuse marginal, spahenous vein graft to diagonal, saphenous vein graft to RCA with endarterectomy  . Cerebrovascular disease    MRA in 2005 with 75% stenosis in distal right vertebral artery and 75% stenosis greater in distal cervical internal carotid artery on teh right, severe bilateral disease and MRA of the extracranial circulation, high grade stenosis of the distal right internal carotid artery at the junction of the cervical internal carotid artery of the skull base  . CKD stage 3 due to type 2 diabetes mellitus ([place])   . Collapsed lung   . Complete heart block ([place]) 05/14/2014   St. Jude BiV PM (serial number [id number]) pacemaker  . GERD (gastroesophageal reflux disease)   . History of blood transfusion 2002   "after my heart attack"  . History of stomach ulcers 1960s  . History of tobacco use    Quit in 1990  . Hyperlipidemia   . Hypertension   . Hypothyroidism   . Myocardial infarction ([place]) 2002  . Osteopenia 2016  . Peripheral  vascular disease ([place])    left hand  . PONV (postoperative nausea and vomiting)   . Presence of permanent cardiac pacemaker   . Type II diabetes mellitus [facility])     Patient Active Problem List   Diagnosis Date Noted  . Ischemia of upper extremity 09/07/2015  . Atherosclerosis of arteries of extremities ([place]) 08/21/2015  . PAD (peripheral artery disease) ([place]) 08/21/2015  . Hypothyroidism due to acquired atrophy of thyroid 07/31/2015  . Vitamin D deficiency 07/31/2015  . Essential hypertension 06/30/2015  . Biventricular cardiac pacemaker in situ 08/22/2014  . Osteopenia 07/25/2014  . CKD stage 3 due to type 2 diabetes mellitus ([place])   . Cardiomyopathy, ischemic   . Coronary artery disease involving native coronary artery of native heart without angina pectoris   . Complete heart block ([place]) 05/14/2014  . Syncope 04/18/2014  . Hypertension associated with diabetes ([place]) 04/18/2014  . Hx of BKA, bilateral 05/03/2013  . Mitral regurgitation 07/03/2011  . 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 Hyperlipidemia LDL goal <70 11/05/2008  . CEREBROVASCULAR DISEASE 11/05/2008  . TOBACCO ABUSE, HX OF 11/05/2008  . 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 AMPUTATION  10/17/2011   Procedure: AMPUTATION DIGIT;  Surgeon: Newt Minion, MD;  Location: [facility];  Service: Orthopedics;  Laterality: Left;  Amputation Left Index Finger at PIP Joint  . ARTERIAL BYPASS SURGRY     Left; left femoral to posterior tibial bypass gracting, left femoral artery and deep femoral artery endarterectomy with vein patch angioplasty of the common femoral arter and deep femoral artery 06/2005, right fremoral popliteal bypass, right iliac artery restent, bilateral below knee amputations  . BI-VENTRICULAR PACEMAKER INSERTION N/A 05/16/2014   Procedure: BI-VENTRICULAR PACEMAKER INSERTION (CRT-P);   Surgeon: Evans Lance, MD; St. Jude BiV PM (serial number [id number]) pacemaker  . CARDIAC CATHETERIZATION    . CARPAL TUNNEL RELEASE Right   . CATARACT EXTRACTION W/ INTRAOCULAR LENS  IMPLANT, BILATERAL Bilateral   . CORONARY ARTERY BYPASS GRAFT  2002   CABG X "4", By Ivin Poot, MD  . ENDARTERECTOMY Left 09/07/2015   Procedure: ENDARTERECTOMY LEFT  BRACHIAL ARTERY ;  Surgeon: Angelia Mould, MD;  Location: [place];  Service: Vascular;  Laterality: Left;  . INSERT / REPLACE / REMOVE PACEMAKER    . LEFT HEART CATHETERIZATION WITH CORONARY ANGIOGRAM N/A 05/10/2014   Procedure: LEFT HEART CATHETERIZATION WITH CORONARY ANGIOGRAM;  Surgeon: Burnell Blanks, MD;  Location: [facility] CATH LAB;  Service: Cardiovascular;  Laterality: N/A;  . LEG AMPUTATION BELOW KNEE Bilateral 2001-2004  . PATCH ANGIOPLASTY Left 09/07/2015   Procedure: LEFT BRACHIAL ARTERY PATCH ANGIOPLASTY USING XENOSURE BIOLOGIC PATCH;  Surgeon: Angelia Mould, MD;  Location: [place];  Service: Vascular;  Laterality: Left;  . PERIPHERAL VASCULAR CATHETERIZATION N/A 08/21/2015   Procedure: Upper Extremity Angiography;  Surgeon: Angelia Mould, MD;  Location: [facility] CV LAB;  Service: Cardiovascular;  Laterality: N/A;  . PERIPHERAL VASCULAR CATHETERIZATION N/A 08/21/2015   Procedure: Aortic Arch Angiography;  Surgeon: Angelia Mould, MD;  Location: [facility] CV LAB;  Service: Cardiovascular;  Laterality: N/A;  . TEMPORARY PACEMAKER INSERTION N/A 05/14/2014   Procedure: TEMPORARY PACEMAKER INSERTION;  Surgeon: Blane Ohara, MD;  Location: [facility] CATH LAB;  Service: Cardiovascular;  Laterality: N/A;  . THORACOTOMY     With drainage of a hemathroax and decortication of fibrothorax       Home Medications    Prior to Admission medications   Medication Sig Start Date End Date Taking? Authorizing Provider  ACCU-CHEK SOFTCLIX LANCETS lancets Check BG 4Xdaily DX E11.65 05/01/16   Chipper Herb, MD  aspirin 81  MG tablet Take 81 mg by mouth daily.      Historical Provider, MD  Blood Glucose Monitoring Suppl (ACCU-CHEK AVIVA PLUS) W/DEVICE KIT Check BG 4XDaily DX E11.65 05/23/14   Chipper Herb, MD  carvedilol (COREG) 25 MG tablet Take 1 tablet (25 mg total) by mouth 2 (two) times daily with a meal. 05/01/16   Chipper Herb, MD  cephALEXin (KEFLEX) 500 MG capsule Take 1 capsule (500 mg total) by mouth 3 (three) times daily. 08/21/16   Angelia Mould, MD  Cholecalciferol (VITAMIN D PO) Take 1,000 Units by mouth daily.     Historical Provider, MD  furosemide (LASIX) 40 MG tablet Take 1 tablet (40 mg total) by mouth daily. 05/01/16   Chipper Herb, MD  gabapentin (NEURONTIN) 600 MG tablet Take 1 tablet (600 mg total) by mouth 2 (two) times daily. 05/01/16   Chipper Herb, MD  glucose blood test strip Check BS QID and PRN 05/01/16   Chipper Herb, MD  insulin aspart (NOVOLOG) 100 UNIT/ML FlexPen Inject 1-10 Units into the skin 3 (three) times daily with  meals. 07/02/16   Cherre Robins, PharmD  Insulin Glargine (LANTUS SOLOSTAR) 100 UNIT/ML Solostar Pen Inject 30-32 Units into the skin daily at 10 pm. 08/19/16   Cherre Robins, PharmD  Insulin Pen Needle (ADVOCATE INSULIN PEN NEEDLES) 33G X 4 MM MISC 1 each by Does not apply route 4 (four) times daily. Use with insulin pens qid 08/19/16   Cherre Robins, PharmD  levothyroxine (SYNTHROID, LEVOTHROID) 125 MCG tablet TAKE 1 TABLET (125 MCG TOTAL) BY MOUTH DAILY. 05/01/16   Chipper Herb, MD  mupirocin ointment (BACTROBAN) 2 % Place 1 application into the nose 2 (two) times daily. 06/04/16   Sharion Balloon, FNP  nortriptyline (PAMELOR) 25 MG capsule TAKE 1 CAPSULE (25 MG TOTAL) BY MOUTH AT BEDTIME. 05/01/16   Chipper Herb, MD  omeprazole (PRILOSEC) 20 MG capsule Take 1 capsule (20 mg total) by mouth every other day. 05/01/16   Chipper Herb, MD  simvastatin (ZOCOR) 40 MG tablet TAKE 1 TABLET (40 MG TOTAL) BY MOUTH AT BEDTIME. 05/01/16   Chipper Herb, MD    traMADol (ULTRAM) 50 MG tablet Take 1 tablet (50 mg total) by mouth every 12 (twelve) hours as needed. 07/31/16   Timmothy Euler, MD    Family History Family History  Problem Relation Age of Onset  . Cancer Mother     brain and lung  . Diabetes Father 66  . Coronary artery disease Father   . Diabetes Sister   . Diabetes Brother   . Diabetes Son   . Colon cancer Neg Hx     Social History Social History  Substance Use Topics  . Smoking status: Former Smoker    Packs/day: 1.00    Types: Cigarettes    Start date: 11/04/1954    Quit date: 05/06/1988  . Smokeless tobacco: Never Used  . Alcohol use No     Allergies   Codeine; Ciprofloxacin; and Nsaids   Review of Systems Review of Systems  Reason unable to perform ROS: Urgent need for intervention.     Physical Exam Updated Vital Signs BP (!) 129/92 (BP Location: Left Arm)   Pulse 77   Resp (!) 22   SpO2 99%   Physical Exam  Constitutional:  Eyes are open but he is not speaking  HENT:  Head: Normocephalic and atraumatic.  Eyes: Conjunctivae are normal.  Neck: Neck supple.  Cardiovascular: Normal rate and regular rhythm.   Pulmonary/Chest: Effort normal and breath sounds normal.  Abdominal: Soft. Bowel sounds are normal.  Musculoskeletal:  Bilateral BKA  Neurological:  Moving arms and legs but not purposefully  Skin: Skin is warm and dry.  Psychiatric:  Unable  Nursing note and vitals reviewed.    ED Treatments / Results  Labs (all labs ordered are listed, but only abnormal results are displayed) Labs Reviewed  CBG MONITORING, ED - Abnormal; Notable for the following:       Result Value   Glucose-Capillary 159 (*)    All other components within normal limits  CULTURE, BLOOD (ROUTINE X 2)  CULTURE, BLOOD (ROUTINE X 2)  CBC WITH DIFFERENTIAL/PLATELET  COMPREHENSIVE METABOLIC PANEL  TROPONIN I  URINALYSIS, ROUTINE W REFLEX MICROSCOPIC  LACTIC ACID, PLASMA  LACTIC ACID, PLASMA  CBG MONITORING, ED     EKG  EKG Interpretation None       Radiology Dg Chest 1 View  Result Date: 08/21/2016 CLINICAL DATA:  Unresponsive. EXAM: CHEST 1 VIEW COMPARISON:  05/17/2014 FINDINGS: 1936 hours. The cardio pericardial  silhouette is enlarged. Interstitial markings are diffusely coarsened with chronic features. There is pulmonary vascular congestion without overt pulmonary edema. Left permanent pacemaker again noted. Patient is status post median sternotomy/ CABG. Bones are diffusely demineralized. Telemetry leads overlie the chest. IMPRESSION: Cardiomegaly with vascular congestion and underlying chronic interstitial changes. Electronically Signed   By: Misty Stanley M.D.   On: 08/08/2016 20:07   Ct Head Wo Contrast  Result Date: 08/13/2016 CLINICAL DATA:  Unresponsive. EXAM: CT HEAD WITHOUT CONTRAST TECHNIQUE: Contiguous axial images were obtained from the base of the skull through the vertex without intravenous contrast. COMPARISON:  04/18/2014 FINDINGS: Brain: There is no evidence for acute hemorrhage, hydrocephalus, mass lesion, or abnormal extra-axial fluid collection. No definite CT evidence for acute infarction. Diffuse loss of parenchymal volume is consistent with atrophy. Patchy low attenuation in the deep hemispheric and periventricular white matter is nonspecific, but likely reflects chronic microvascular ischemic demyelination. Lacunar infarct noted right basal ganglia and right thalamus, similar to prior. Vascular: No hyperdense vessel or unexpected calcification. Skull: No evidence for fracture. No worrisome lytic or sclerotic lesion. Sinuses/Orbits: Mucosal thickening noted in the maxillary sinuses and ethmoid air cells, suggesting chronic sinusitis.Visualized portions of the globes and intraorbital fat are unremarkable. Other: None. IMPRESSION: 1. Stable exam.  No acute intracranial abnormality. 2. Atrophy with chronic small vessel white matter disease and chronic lacunar infarcts in the basal  ganglia. Electronically Signed   By: Misty Stanley M.D.   On: 08/09/2016 20:06    Procedures Procedures (including critical care time)  Medications Ordered in ED Medications  azithromycin (ZITHROMAX) 500 mg in dextrose 5 % 250 mL IVPB (500 mg Intravenous New Bag/Given 08/10/2016 2101)  sodium chloride 0.9 % bolus 1,000 mL (0 mLs Intravenous Stopped 08/27/2016 2050)  dextrose 50 % solution 50 mL (50 mLs Intravenous Given 08/28/2016 1942)  ipratropium-albuterol (DUONEB) 0.5-2.5 (3) MG/3ML nebulizer solution 3 mL (3 mLs Nebulization Given 08/07/2016 2030)  cefTRIAXone (ROCEPHIN) 1 g in dextrose 5 % 50 mL IVPB (0 g Intravenous Stopped 08/14/2016 2101)     Initial Impression / Assessment and Plan / ED Course  I have reviewed the triage vital signs and the nursing notes.  Pertinent labs & imaging results that were available during my care of the patient were reviewed by me and considered in my medical decision making (see chart for details).    CRITICAL CARE Performed by: Nat Christen Total critical care time: 40 minutes Critical care time was exclusive of separately billable procedures and treating other patients. Critical care was necessary to treat or prevent imminent or life-threatening deterioration. Critical care was time spent personally by me on the following activities: development of treatment plan with patient and/or surrogate as well as nursing, discussions with consultants, evaluation of patient's response to treatment, examination of patient, obtaining history from patient or surrogate, ordering and performing treatments and interventions, ordering and review of laboratory studies, ordering and review of radiographic studies, pulse oximetry and re-evaluation of patient's condition.  Patient has had a prolonged hypoglycemic episode. He now has neurological deficits. CT head and chest x-ray showed no obvious acute anomalies. Glucose has remained greater than 100. DO NOT RESUSCITATE discussed with  family. Will admit to general medicine. Final Clinical Impressions(s) / ED Diagnoses   Final diagnoses:  Hypoglycemia  Altered mental status, unspecified altered mental status type    New Prescriptions New Prescriptions   No medications on file     Nat Christen, MD 08/21/2016 2150

## 2016-08-29 NOTE — ED Notes (Signed)
Wife given pt clothing, medications, & a silver colored watch.

## 2016-08-29 NOTE — ED Notes (Signed)
Date and time results received: 08/15/2016  2144 (use smartphrase ".now" to insert current time)  Test: lactic acid Critical Value: 3.1  Name of Provider Notified: Adriana Simas  Orders Received? Or Actions EDP advised

## 2016-08-29 NOTE — ED Notes (Addendum)
Donor services called. Case # [id number]  Agent spoken to [facility].

## 2016-08-29 NOTE — ED Notes (Signed)
Time of death Sep 18, 2203.

## 2016-08-29 NOTE — ED Notes (Signed)
Date and time results received: 2016/09/13  2158 (use smartphrase ".now" to insert current time)  Test: Troponin Critical Value: 0.05  Name of Provider Notified: Conley Rolls  Orders Received? Or Actions Taken?: Advised.

## 2016-08-29 NOTE — ED Notes (Signed)
Medical Examiner called no ME case.

## 2016-08-29 NOTE — ED Notes (Signed)
MD spoke to family. Family in room at this time.

## 2016-08-29 NOTE — ED Notes (Signed)
Called lab about blood work not showing up.

## 2016-09-03 DIAGNOSIS — 419620001 Death: Secondary | SNOMED CT | POA: Insufficient documentation

## 2016-09-03 LAB — CULTURE, BLOOD (ROUTINE X 2)
CULTURE: NO GROWTH
CULTURE: NO GROWTH
SPECIAL REQUESTS: ADEQUATE

## 2016-09-03 DEATH — deceased

## 2016-09-20 ENCOUNTER — Ambulatory Visit: Payer: PPO | Admitting: Family Medicine

## 2016-10-02 ENCOUNTER — Encounter (HOSPITAL_COMMUNITY): Payer: Self-pay

## 2016-10-02 ENCOUNTER — Encounter (HOSPITAL_COMMUNITY): Payer: PPO

## 2016-10-02 ENCOUNTER — Ambulatory Visit: Payer: PPO | Admitting: Vascular Surgery

## 2016-10-24 ENCOUNTER — Ambulatory Visit: Payer: Self-pay | Admitting: Pharmacist

## 2018-03-03 IMAGING — DX DG CHEST 2V
2 series · 2 of 2 positions shown · non-contrast
Comparison: None in PACs

CLINICAL DATA: Malaise for 2 weeks; history of coronary artery and
peripheral vascular disease, diabetes, chronic renal insufficiency,
former smoker.

EXAM:
CHEST  2 VIEW

[chest pa]
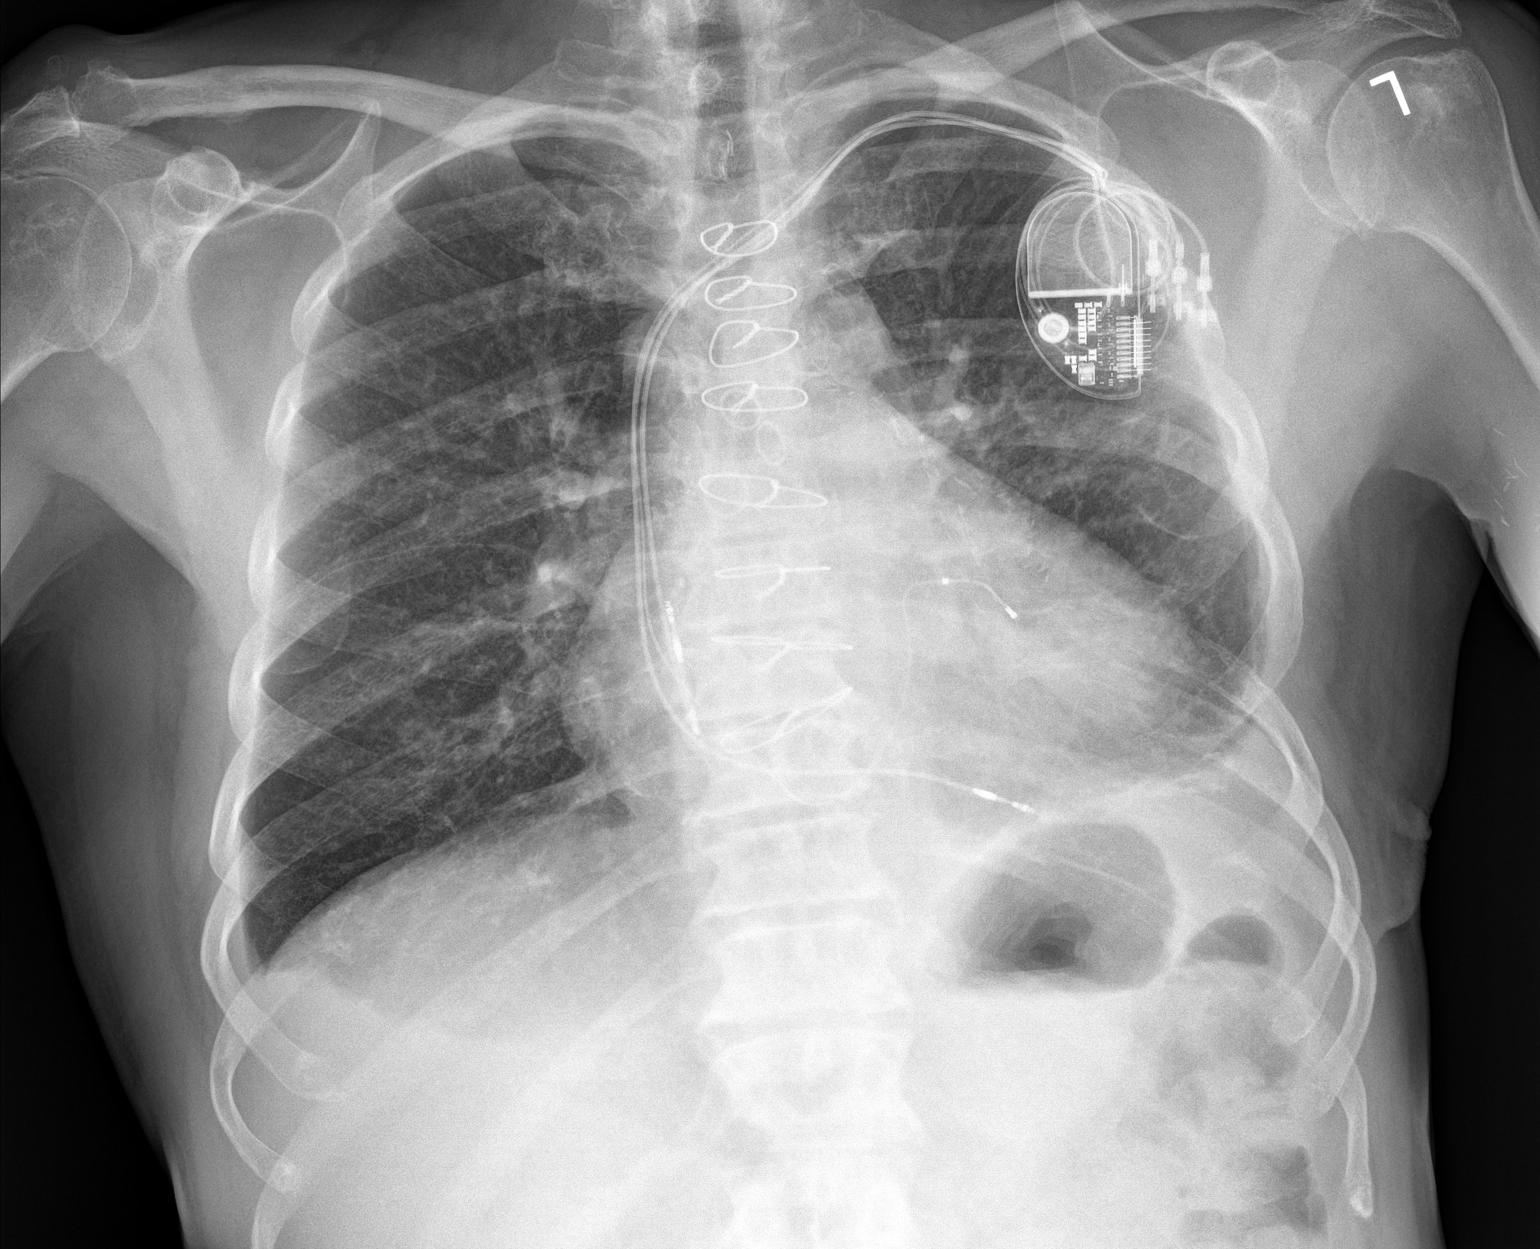

[chest lat]
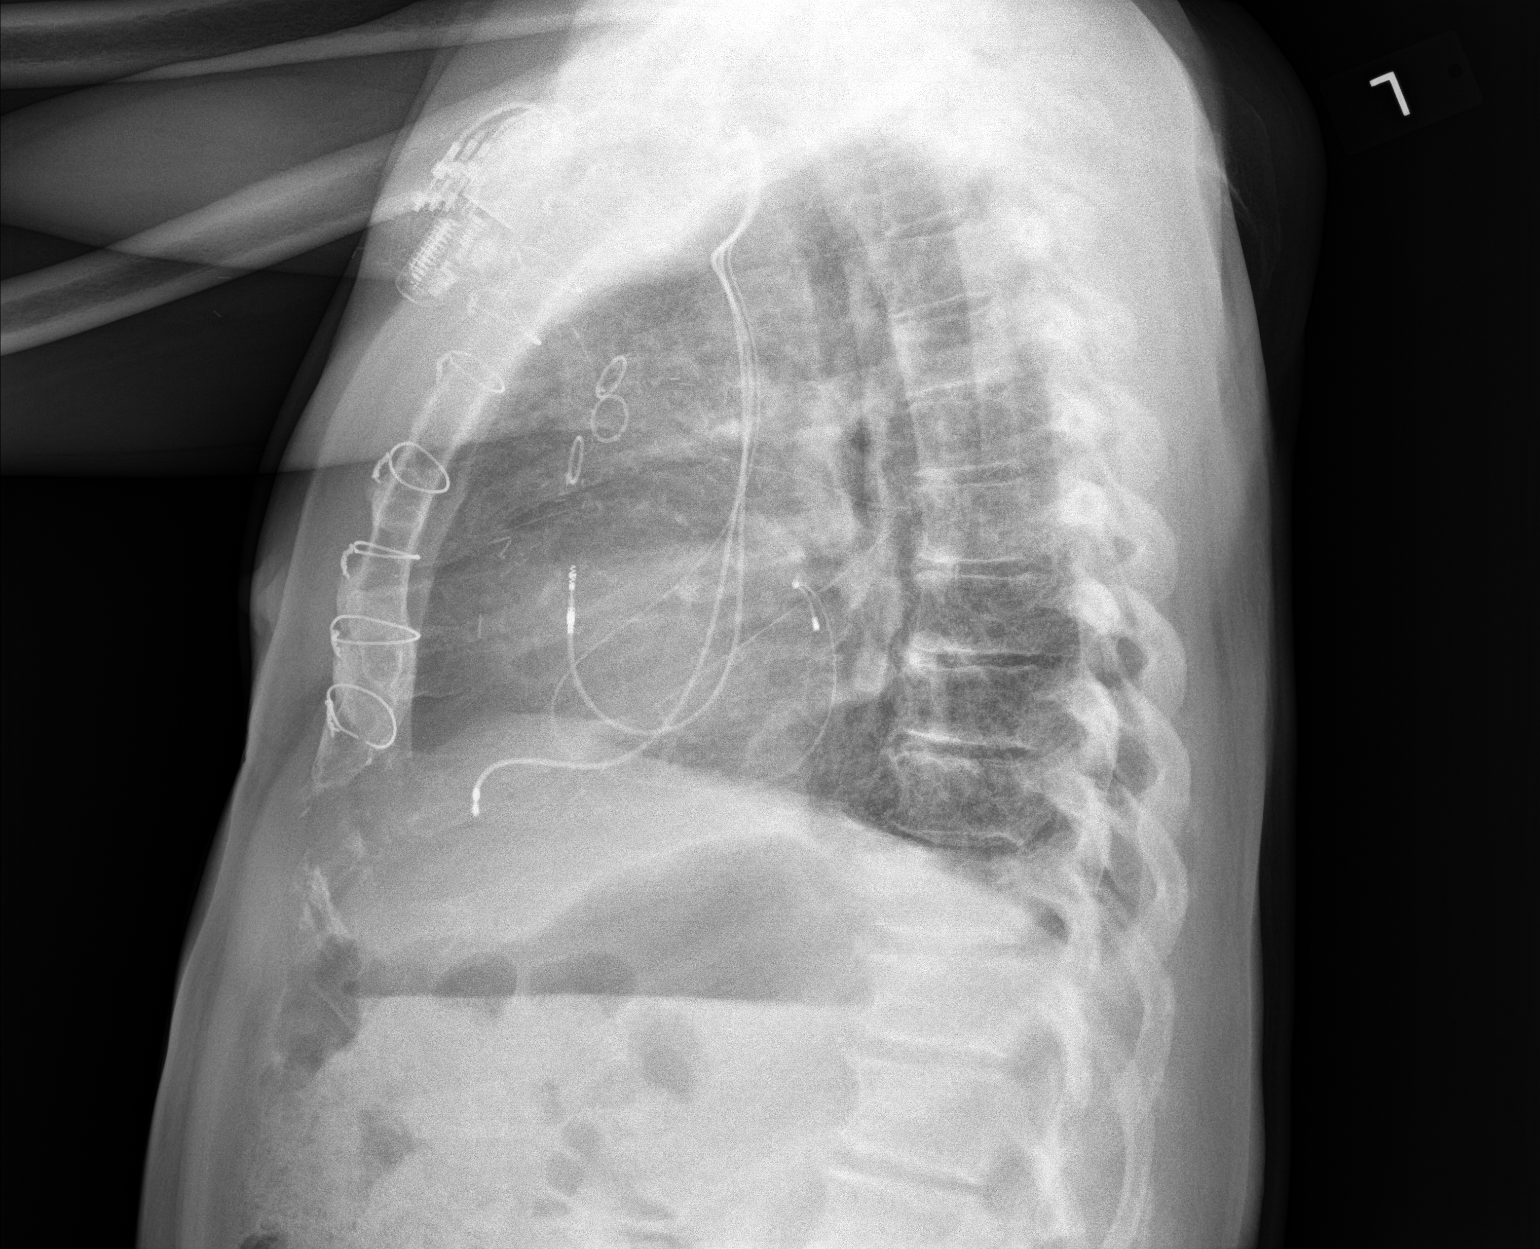

[2 of 2 positions shown; findings below may reference images not displayed]

FINDINGS: The lungs are reasonably well inflated. There are small bilateral
pleural effusions greater on the left. The patient has undergone
previous CABG. The lower most sternal wire is broken. The cardiac
silhouette is mildly enlarged. Pulmonary vascularity is mildly
engorged. The permanent pacemaker is in stable position. The bony
thorax exhibits no acute abnormality.
IMPRESSION: Low-grade compensated CHF with small bilateral pleural effusions.
Probable underlying COPD. There is no alveolar pneumonia
